# Patient Record
Sex: Female | Born: 1935 | Race: Black or African American | Hispanic: No | Marital: Married | Smoking: Never smoker
Health system: Southern US, Community
[De-identification: ages and names within clinical notes are randomized; demographics above are authoritative.]

## PROBLEM LIST (undated history)

## (undated) DIAGNOSIS — I729 Aneurysm of unspecified site: Secondary | ICD-10-CM

## (undated) DIAGNOSIS — I4891 Unspecified atrial fibrillation: Secondary | ICD-10-CM

## (undated) DIAGNOSIS — I509 Heart failure, unspecified: Secondary | ICD-10-CM

## (undated) DIAGNOSIS — E785 Hyperlipidemia, unspecified: Secondary | ICD-10-CM

## (undated) DIAGNOSIS — I10 Essential (primary) hypertension: Secondary | ICD-10-CM

## (undated) DIAGNOSIS — J189 Pneumonia, unspecified organism: Secondary | ICD-10-CM

## (undated) DIAGNOSIS — I2510 Atherosclerotic heart disease of native coronary artery without angina pectoris: Secondary | ICD-10-CM

## (undated) DIAGNOSIS — I219 Acute myocardial infarction, unspecified: Secondary | ICD-10-CM

## (undated) DIAGNOSIS — N186 End stage renal disease: Secondary | ICD-10-CM

## (undated) DIAGNOSIS — D649 Anemia, unspecified: Principal | ICD-10-CM

## (undated) DIAGNOSIS — Z992 Dependence on renal dialysis: Secondary | ICD-10-CM

## (undated) DIAGNOSIS — T81718A Complication of other artery following a procedure, not elsewhere classified, initial encounter: Secondary | ICD-10-CM

## (undated) DIAGNOSIS — F419 Anxiety disorder, unspecified: Secondary | ICD-10-CM

## (undated) DIAGNOSIS — R011 Cardiac murmur, unspecified: Secondary | ICD-10-CM

## (undated) DIAGNOSIS — I350 Nonrheumatic aortic (valve) stenosis: Secondary | ICD-10-CM

## (undated) HISTORY — PX: CARDIAC CATHETERIZATION: SHX172

## (undated) HISTORY — PX: FOREARM FRACTURE SURGERY: SHX649

## (undated) HISTORY — PX: FRACTURE SURGERY: SHX138

## (undated) HISTORY — PX: CORONARY ANGIOPLASTY WITH STENT PLACEMENT: SHX49

## (undated) HISTORY — PX: AV FISTULA PLACEMENT: SHX1204

## (undated) HISTORY — DX: Hyperlipidemia, unspecified: E78.5

## (undated) HISTORY — DX: Anemia, unspecified: D64.9

---

## 2002-03-08 ENCOUNTER — Encounter: Payer: Self-pay | Admitting: Family Medicine

## 2002-03-08 ENCOUNTER — Ambulatory Visit (HOSPITAL_COMMUNITY): Admission: RE | Admit: 2002-03-08 | Discharge: 2002-03-08 | Payer: Self-pay | Admitting: Family Medicine

## 2002-11-30 ENCOUNTER — Ambulatory Visit (HOSPITAL_COMMUNITY): Admission: RE | Admit: 2002-11-30 | Discharge: 2002-11-30 | Payer: Self-pay | Admitting: Family Medicine

## 2002-11-30 ENCOUNTER — Encounter: Payer: Self-pay | Admitting: Family Medicine

## 2004-12-13 ENCOUNTER — Ambulatory Visit (HOSPITAL_COMMUNITY): Admission: RE | Admit: 2004-12-13 | Discharge: 2004-12-13 | Payer: Self-pay | Admitting: Family Medicine

## 2006-11-27 ENCOUNTER — Ambulatory Visit (HOSPITAL_COMMUNITY): Admission: RE | Admit: 2006-11-27 | Discharge: 2006-11-27 | Payer: Self-pay | Admitting: Family Medicine

## 2006-12-09 ENCOUNTER — Ambulatory Visit (HOSPITAL_COMMUNITY): Admission: RE | Admit: 2006-12-09 | Discharge: 2006-12-09 | Payer: Self-pay | Admitting: *Deleted

## 2009-06-07 ENCOUNTER — Encounter: Payer: Self-pay | Admitting: Gastroenterology

## 2009-06-19 ENCOUNTER — Ambulatory Visit (HOSPITAL_COMMUNITY): Admission: RE | Admit: 2009-06-19 | Discharge: 2009-06-19 | Payer: Self-pay | Admitting: Gastroenterology

## 2009-06-19 ENCOUNTER — Ambulatory Visit: Payer: Self-pay | Admitting: Gastroenterology

## 2009-06-26 ENCOUNTER — Encounter: Payer: Self-pay | Admitting: Gastroenterology

## 2009-06-26 ENCOUNTER — Encounter (INDEPENDENT_AMBULATORY_CARE_PROVIDER_SITE_OTHER): Payer: Self-pay

## 2010-07-17 ENCOUNTER — Ambulatory Visit
Admission: RE | Admit: 2010-07-17 | Discharge: 2010-07-17 | Payer: Self-pay | Source: Home / Self Care | Attending: Vascular Surgery | Admitting: Vascular Surgery

## 2010-07-17 ENCOUNTER — Ambulatory Visit: Admit: 2010-07-17 | Payer: Self-pay | Admitting: Vascular Surgery

## 2010-07-30 ENCOUNTER — Ambulatory Visit (HOSPITAL_COMMUNITY)
Admission: RE | Admit: 2010-07-30 | Discharge: 2010-07-30 | Payer: Self-pay | Source: Home / Self Care | Attending: Vascular Surgery | Admitting: Vascular Surgery

## 2010-07-30 LAB — POCT I-STAT 4, (NA,K, GLUC, HGB,HCT)
Hemoglobin: 11.6 g/dL — ABNORMAL LOW (ref 12.0–15.0)
Potassium: 5.2 mEq/L — ABNORMAL HIGH (ref 3.5–5.1)
Sodium: 140 mEq/L (ref 135–145)

## 2010-07-30 LAB — SURGICAL PCR SCREEN
MRSA, PCR: NEGATIVE
Staphylococcus aureus: NEGATIVE

## 2010-07-31 LAB — POCT I-STAT 4, (NA,K, GLUC, HGB,HCT)
Glucose, Bld: 98 mg/dL (ref 70–99)
HCT: 32 % — ABNORMAL LOW (ref 36.0–46.0)
Hemoglobin: 10.9 g/dL — ABNORMAL LOW (ref 12.0–15.0)

## 2010-08-03 NOTE — Op Note (Signed)
  NAME:  [name], [name] NO.:  [id number]  MEDICAL RECORD NO.:  [id number]          PATIENT TYPE:  AMB  LOCATION:  SDS                          FACILITY:  [facility]  PHYSICIAN:  Larina Earthly, [name]    DATE OF BIRTH:  1927/12/22  DATE OF PROCEDURE:  07/30/2010 DATE OF DISCHARGE:  07/30/2010                              OPERATIVE REPORT   PREOPERATIVE DIAGNOSIS:  Chronic renal insufficiency.  POSTOPERATIVE DIAGNOSIS:  Chronic renal insufficiency.  PROCEDURE:  Left upper arm AV fistula creation.  SURGEON:  Larina Earthly, MD.  ASSISTANT:  Della Goo, PA-C  ANESTHESIA:  MAC.  COMPLICATIONS:  None.  DISPOSITION:  Recovery room, stable.  PROCEDURE IN DETAIL:  The patient was taken to the operating room and placed in supine position where the area of the right arm was prepped and draped in the usual sterile fashion.  SonoSite ultrasound was used to confirm good caliber cephalic vein at the antecubital space extending onto the upper arm.  Using local anesthesia, the incision was made at the antecubital space, carried down to isolate the cephalic vein and the brachial artery.  The artery was of good caliber with minimal atherosclerotic change.  The cephalic vein was mobilized proximally and distally, and tributary branches were ligated with 3-0 and 4-0 silk ties and divided.  The vein was ligated distally and was divided and mobilized to the level of brachial artery.  The brachial artery was occluded proximally and distally, and a small arteriotomy was created. The vein was sewn end-to-side to the artery with a running 6-0 Prolene suture.  Clamps removed and excellent thrill was noted through the vein. The wound was irrigated with saline.  Hemostasis with electrocautery. Wound was closed with 3-0 Vicryl in the subcutaneous and subcuticular tissues.  Benzoin and Steri-Strips were applied.     Larina Earthly, [name]     TFE/MEDQ  D:  07/30/2010  T:   07/31/2010  Job:  [id number]  Electronically Signed by TODD EARLY [name] on 08/03/2010 03:35:37 PM

## 2010-09-13 ENCOUNTER — Ambulatory Visit (HOSPITAL_COMMUNITY): Payer: PRIVATE HEALTH INSURANCE

## 2010-09-13 ENCOUNTER — Encounter (HOSPITAL_COMMUNITY): Payer: PRIVATE HEALTH INSURANCE | Attending: Family Medicine

## 2010-09-13 ENCOUNTER — Other Ambulatory Visit (HOSPITAL_COMMUNITY): Payer: PRIVATE HEALTH INSURANCE

## 2010-09-13 DIAGNOSIS — Z992 Dependence on renal dialysis: Secondary | ICD-10-CM | POA: Insufficient documentation

## 2010-09-13 DIAGNOSIS — D631 Anemia in chronic kidney disease: Secondary | ICD-10-CM | POA: Insufficient documentation

## 2010-09-13 DIAGNOSIS — N186 End stage renal disease: Secondary | ICD-10-CM | POA: Insufficient documentation

## 2010-09-13 DIAGNOSIS — I120 Hypertensive chronic kidney disease with stage 5 chronic kidney disease or end stage renal disease: Secondary | ICD-10-CM | POA: Insufficient documentation

## 2010-09-14 ENCOUNTER — Ambulatory Visit: Payer: Self-pay

## 2010-09-20 ENCOUNTER — Ambulatory Visit (HOSPITAL_COMMUNITY): Payer: PRIVATE HEALTH INSURANCE

## 2010-09-25 ENCOUNTER — Ambulatory Visit: Payer: Self-pay | Admitting: Vascular Surgery

## 2010-09-25 ENCOUNTER — Ambulatory Visit (INDEPENDENT_AMBULATORY_CARE_PROVIDER_SITE_OTHER): Payer: Medicare Other | Admitting: Vascular Surgery

## 2010-09-25 DIAGNOSIS — N186 End stage renal disease: Secondary | ICD-10-CM

## 2010-09-26 NOTE — Assessment & Plan Note (Signed)
OFFICE VISIT  [name], [name] DOB:  1928/05/11                                       09/25/2010 HYQMV#:[id number]  The patient presents today for followup of her left upper arm AV fistula creation by myself on July 30, 2010.  She reports that she is still not on kidney dialysis but is having progressive renal insufficiency. She has had no difficulty related to her fistula creation.  On physical exam, she has a well-healed antecubital incision.  She has excellent Rahsaan Weakland maturation of her fistula.  This is quite large with an excellent thrill.  I am quite encouraged by this and feel that she will have a very high chance of using her fistula if she comes to hemodialysis.  We would prefer to wait a total of 3 months from the creation of this on July 30, 2010.  But, if she had would need dialysis within the next several weeks, I feel that it would be acceptable to begin using her left upper arm AV fistula.  She will see [place] again on an as-needed basis.    Larina Earthly, [name] Electronically Signed  TFE/MEDQ  D:  09/25/2010  T:  09/26/2010  Job:  [id number]  cc:   Jorja Loa, [name]

## 2010-09-27 ENCOUNTER — Ambulatory Visit (HOSPITAL_COMMUNITY): Payer: Self-pay

## 2010-09-27 ENCOUNTER — Encounter (HOSPITAL_COMMUNITY): Payer: PRIVATE HEALTH INSURANCE

## 2010-09-27 ENCOUNTER — Ambulatory Visit: Payer: Self-pay

## 2010-09-27 ENCOUNTER — Ambulatory Visit (HOSPITAL_COMMUNITY): Payer: Medicare Other

## 2010-10-04 ENCOUNTER — Ambulatory Visit (HOSPITAL_COMMUNITY): Payer: Self-pay

## 2010-10-04 ENCOUNTER — Ambulatory Visit (HOSPITAL_COMMUNITY): Payer: Medicare Other

## 2010-10-04 ENCOUNTER — Other Ambulatory Visit (HOSPITAL_COMMUNITY): Payer: Medicare Other

## 2010-10-04 ENCOUNTER — Encounter (HOSPITAL_COMMUNITY): Payer: PRIVATE HEALTH INSURANCE

## 2010-10-11 ENCOUNTER — Encounter (HOSPITAL_COMMUNITY): Payer: Medicare Other | Attending: Family Medicine

## 2010-10-11 ENCOUNTER — Other Ambulatory Visit (HOSPITAL_COMMUNITY): Payer: Self-pay

## 2010-10-11 DIAGNOSIS — N039 Chronic nephritic syndrome with unspecified morphologic changes: Secondary | ICD-10-CM | POA: Insufficient documentation

## 2010-10-11 DIAGNOSIS — D631 Anemia in chronic kidney disease: Secondary | ICD-10-CM | POA: Insufficient documentation

## 2010-10-11 DIAGNOSIS — Z992 Dependence on renal dialysis: Secondary | ICD-10-CM | POA: Insufficient documentation

## 2010-10-11 DIAGNOSIS — N186 End stage renal disease: Secondary | ICD-10-CM | POA: Insufficient documentation

## 2010-10-11 DIAGNOSIS — I120 Hypertensive chronic kidney disease with stage 5 chronic kidney disease or end stage renal disease: Secondary | ICD-10-CM | POA: Insufficient documentation

## 2010-10-18 ENCOUNTER — Encounter (HOSPITAL_COMMUNITY): Payer: Medicare Other

## 2010-10-25 ENCOUNTER — Encounter (HOSPITAL_COMMUNITY): Payer: Medicare Other

## 2010-11-01 ENCOUNTER — Encounter (HOSPITAL_COMMUNITY): Payer: Medicare Other

## 2010-11-08 ENCOUNTER — Other Ambulatory Visit: Payer: Self-pay | Admitting: Family Medicine

## 2010-11-08 ENCOUNTER — Encounter (HOSPITAL_COMMUNITY): Payer: PRIVATE HEALTH INSURANCE | Attending: Family Medicine

## 2010-11-08 DIAGNOSIS — N186 End stage renal disease: Secondary | ICD-10-CM | POA: Insufficient documentation

## 2010-11-08 DIAGNOSIS — I120 Hypertensive chronic kidney disease with stage 5 chronic kidney disease or end stage renal disease: Secondary | ICD-10-CM | POA: Insufficient documentation

## 2010-11-08 DIAGNOSIS — Z992 Dependence on renal dialysis: Secondary | ICD-10-CM | POA: Insufficient documentation

## 2010-11-08 DIAGNOSIS — D631 Anemia in chronic kidney disease: Secondary | ICD-10-CM | POA: Insufficient documentation

## 2010-11-08 LAB — RENAL FUNCTION PANEL
Albumin: 3.7 g/dL (ref 3.5–5.2)
BUN: 47 mg/dL — ABNORMAL HIGH (ref 6–23)
CO2: 16 mEq/L — ABNORMAL LOW (ref 19–32)
Calcium: 8.2 mg/dL — ABNORMAL LOW (ref 8.4–10.5)
Chloride: 108 mEq/L (ref 96–112)
Creatinine, Ser: 4.78 mg/dL — ABNORMAL HIGH (ref 0.4–1.2)
GFR calc Af Amer: 11 mL/min — ABNORMAL LOW (ref >60)

## 2010-11-08 LAB — HEMOGLOBIN AND HEMATOCRIT, BLOOD: HCT: 37.8 % (ref 36.0–46.0)

## 2010-11-12 ENCOUNTER — Encounter (HOSPITAL_COMMUNITY): Payer: PRIVATE HEALTH INSURANCE

## 2010-11-20 ENCOUNTER — Encounter (HOSPITAL_COMMUNITY): Payer: PRIVATE HEALTH INSURANCE

## 2010-11-20 NOTE — Consult Note (Signed)
NEW PATIENT CONSULTATION   [name], [name]  DOB:  01-Aug-1927                                       07/17/2010  XBMWU#:[id number]   Patient presents today for evaluation and discussion regarding AV  access.  She is a very pleasant, delightful 77 year old black female  with progressive renal insufficiency.  According to Dr. Susa Griffins note,  her most recent creatinine was in the range of 3, and we have been  consulted for discussion of access placement.  She does not have any  uremic symptoms currently.  She is quite active and cares for her  husband at home.  She does have a history of hypertension.  Denies any  cardiac disease.  Does not have any history of congestive heart failure.  Prior surgery is related to a fall down a flight of stairs, breaking  both arms and having external fixators repaired at that time.   SOCIAL HISTORY:  She is married with 13 children.  She does not smoke or  drink alcohol.   FAMILY HISTORY:  Negative premature atherosclerotic disease.   REVIEW OF SYSTEMS:  No weight loss or gain.  Her weight is reported at  145 pounds.  She is 5 feet 5 inches tall.  CARDIAC:  Positive for heart murmur.  URINARY:  Positive for renal insufficiency.  Otherwise negative except for HPI.   PHYSICAL EXAMINATION:  A well-developed and well-nourished black female  appearing younger than stated age.  Blood pressure is 155/69, pulse 82,  respirations 16.  She is in no acute stress.  HEENT is normal.  Her  radial pulses are 2+ bilaterally.  She has 2+ brachial pulses  bilaterally.  Abdomen is soft, nontender.  No masses.  Musculoskeletal  shows no major deformity or cyanosis.  Neurologic:  No focal weakness or  paresthesias.  Skin without ulcers or rashes.   She underwent vein map evaluation in our office, and this revealed  patent cephalic vein in the left upper arm, a small cephalic vein in the  left forearm.  She does have small forearm cephalic vein  on the right as  well and a smaller-caliber vein in her right upper arm.   I discussed options with patient.  I explained the indications for  hemodialysis access catheter, AV fistulas, and AV grafts.  I reimaged  her veins myself with a SonoSite, and she does have adequate antecubital  vein on the left for access attempt.  I explained that this would not  hasten her need for potential access but hopefully would have access  available should she come to hemodialysis.  I have recommended that we  proceed with a left upper arm AV fistula creation, and we will schedule  this at her convenience on 01/23 at [facility] as an  outpatient.     Larina Earthly, [name]  Electronically Signed   TFE/MEDQ  D:  07/17/2010  T:  07/17/2010  Job:  [id number]   cc:   Jorja Loa, [name]  Annia Friendly. Loleta Chance, MD

## 2010-11-20 NOTE — Procedures (Signed)
CEPHALIC VEIN MAPPING   INDICATION:  Preoperative AVF placement.   HISTORY:  Chronic kidney disease.   EXAM:  The right cephalic vein is compressible with diameter measurements  ranging from 0.19 to 0.37 cm.   The right basilic vein is compressible with diameter measurements  ranging from 0.27 to 0.51 cm.   The left cephalic vein is compressible with diameter measurements  ranging from 0.20 to 0.50 cm.   The left basilic vein is compressible with diameter measurements ranging  from 0.37 to 0.54 cm.   See attached worksheet for all measurements.   IMPRESSION:  Patent right and left cephalic veins and basilic veins with  diameter measurements as described above.   ___________________________________________  Larina Earthly, [name]   EM/MEDQ  D:  07/17/2010  T:  07/17/2010  Job:  [id number]

## 2010-11-27 ENCOUNTER — Encounter (HOSPITAL_COMMUNITY): Payer: PRIVATE HEALTH INSURANCE

## 2010-12-04 ENCOUNTER — Other Ambulatory Visit (HOSPITAL_COMMUNITY): Payer: Self-pay | Admitting: Oncology

## 2010-12-04 ENCOUNTER — Encounter (HOSPITAL_COMMUNITY): Payer: PRIVATE HEALTH INSURANCE

## 2010-12-04 LAB — HEMOGLOBIN AND HEMATOCRIT, BLOOD
HCT: 35.3 % — ABNORMAL LOW (ref 36.0–46.0)
Hemoglobin: 11 g/dL — ABNORMAL LOW (ref 12.0–15.0)

## 2010-12-04 LAB — RENAL FUNCTION PANEL
Albumin: 3.6 g/dL (ref 3.5–5.2)
Chloride: 107 mEq/L (ref 96–112)
Phosphorus: 6 mg/dL — ABNORMAL HIGH (ref 2.3–4.6)
Potassium: 4.7 mEq/L (ref 3.5–5.1)
Sodium: 137 mEq/L (ref 135–145)

## 2010-12-11 ENCOUNTER — Encounter (HOSPITAL_COMMUNITY): Payer: Medicare Other | Attending: Nephrology

## 2010-12-11 DIAGNOSIS — Z992 Dependence on renal dialysis: Secondary | ICD-10-CM | POA: Insufficient documentation

## 2010-12-11 DIAGNOSIS — I120 Hypertensive chronic kidney disease with stage 5 chronic kidney disease or end stage renal disease: Secondary | ICD-10-CM | POA: Insufficient documentation

## 2010-12-11 DIAGNOSIS — D631 Anemia in chronic kidney disease: Secondary | ICD-10-CM | POA: Insufficient documentation

## 2010-12-11 DIAGNOSIS — N186 End stage renal disease: Secondary | ICD-10-CM | POA: Insufficient documentation

## 2010-12-11 DIAGNOSIS — N039 Chronic nephritic syndrome with unspecified morphologic changes: Secondary | ICD-10-CM | POA: Insufficient documentation

## 2010-12-18 ENCOUNTER — Encounter (HOSPITAL_COMMUNITY): Payer: Medicare Other

## 2010-12-19 ENCOUNTER — Ambulatory Visit (HOSPITAL_COMMUNITY): Payer: Medicare Other

## 2010-12-25 ENCOUNTER — Encounter (HOSPITAL_COMMUNITY): Payer: Medicare Other

## 2011-01-01 ENCOUNTER — Ambulatory Visit (HOSPITAL_COMMUNITY): Payer: Medicare Other

## 2011-01-08 ENCOUNTER — Encounter (HOSPITAL_COMMUNITY): Payer: PRIVATE HEALTH INSURANCE | Attending: Family Medicine

## 2011-01-08 ENCOUNTER — Other Ambulatory Visit: Payer: Self-pay | Admitting: Nephrology

## 2011-01-08 DIAGNOSIS — N186 End stage renal disease: Secondary | ICD-10-CM | POA: Insufficient documentation

## 2011-01-08 DIAGNOSIS — I120 Hypertensive chronic kidney disease with stage 5 chronic kidney disease or end stage renal disease: Secondary | ICD-10-CM | POA: Insufficient documentation

## 2011-01-08 DIAGNOSIS — D631 Anemia in chronic kidney disease: Secondary | ICD-10-CM | POA: Insufficient documentation

## 2011-01-08 DIAGNOSIS — N039 Chronic nephritic syndrome with unspecified morphologic changes: Secondary | ICD-10-CM | POA: Insufficient documentation

## 2011-01-08 DIAGNOSIS — Z992 Dependence on renal dialysis: Secondary | ICD-10-CM | POA: Insufficient documentation

## 2011-01-08 LAB — RENAL FUNCTION PANEL
Albumin: 3.5 g/dL (ref 3.5–5.2)
Calcium: 8.3 mg/dL — ABNORMAL LOW (ref 8.4–10.5)
Creatinine, Ser: 5.58 mg/dL — ABNORMAL HIGH (ref 0.50–1.10)
GFR calc Af Amer: 9 mL/min — ABNORMAL LOW (ref >60)
GFR calc non Af Amer: 7 mL/min — ABNORMAL LOW (ref >60)
Phosphorus: 5.2 mg/dL — ABNORMAL HIGH (ref 2.3–4.6)
Sodium: 138 mEq/L (ref 135–145)

## 2011-01-08 LAB — HEMOGLOBIN AND HEMATOCRIT, BLOOD
HCT: 37 % (ref 36.0–46.0)
Hemoglobin: 11.4 g/dL — ABNORMAL LOW (ref 12.0–15.0)

## 2011-01-15 ENCOUNTER — Encounter (HOSPITAL_BASED_OUTPATIENT_CLINIC_OR_DEPARTMENT_OTHER): Payer: PRIVATE HEALTH INSURANCE

## 2011-01-15 VITALS — BP 164/74 | HR 71

## 2011-01-15 DIAGNOSIS — N19 Unspecified kidney failure: Secondary | ICD-10-CM

## 2011-01-15 DIAGNOSIS — N189 Chronic kidney disease, unspecified: Secondary | ICD-10-CM

## 2011-01-15 DIAGNOSIS — D631 Anemia in chronic kidney disease: Secondary | ICD-10-CM

## 2011-01-15 MED ORDER — EPOETIN ALFA 10000 UNIT/ML IJ SOLN
INTRAMUSCULAR | Status: AC
  Filled 2011-01-15: qty 1

## 2011-01-15 NOTE — Progress Notes (Signed)
[name] presents today for injection per MD orders. Procrit 8,000mg  administered SQ in right Abdomen. Administration without incident. Patient tolerated well.

## 2011-01-21 ENCOUNTER — Other Ambulatory Visit (HOSPITAL_COMMUNITY): Payer: Self-pay | Admitting: Family Medicine

## 2011-01-21 DIAGNOSIS — Z139 Encounter for screening, unspecified: Secondary | ICD-10-CM

## 2011-01-22 ENCOUNTER — Encounter (HOSPITAL_BASED_OUTPATIENT_CLINIC_OR_DEPARTMENT_OTHER): Payer: PRIVATE HEALTH INSURANCE

## 2011-01-22 VITALS — BP 146/64 | HR 66

## 2011-01-22 DIAGNOSIS — D649 Anemia, unspecified: Secondary | ICD-10-CM

## 2011-01-22 MED ORDER — EPOETIN ALFA 10000 UNIT/ML IJ SOLN
INTRAMUSCULAR | Status: AC
  Administered 2011-01-22: 8000 [IU] via SUBCUTANEOUS
  Filled 2011-01-22: qty 1

## 2011-01-22 MED ORDER — EPOETIN ALFA 10000 UNIT/ML IJ SOLN: 8000.0000 [IU] | Freq: Once | INTRAMUSCULAR | Status: DC

## 2011-01-24 ENCOUNTER — Ambulatory Visit (HOSPITAL_COMMUNITY)
Admission: RE | Admit: 2011-01-24 | Discharge: 2011-01-24 | Disposition: A | Discharge status: Home / Self Care | Payer: PRIVATE HEALTH INSURANCE | Source: Ambulatory Visit | Attending: Family Medicine | Admitting: Family Medicine

## 2011-01-24 DIAGNOSIS — Z1231 Encounter for screening mammogram for malignant neoplasm of breast: Secondary | ICD-10-CM | POA: Insufficient documentation

## 2011-01-24 DIAGNOSIS — Z139 Encounter for screening, unspecified: Secondary | ICD-10-CM

## 2011-01-28 ENCOUNTER — Other Ambulatory Visit: Payer: Self-pay | Admitting: Family Medicine

## 2011-01-28 DIAGNOSIS — R928 Other abnormal and inconclusive findings on diagnostic imaging of breast: Secondary | ICD-10-CM

## 2011-01-29 ENCOUNTER — Encounter (HOSPITAL_BASED_OUTPATIENT_CLINIC_OR_DEPARTMENT_OTHER): Payer: PRIVATE HEALTH INSURANCE

## 2011-01-29 DIAGNOSIS — D631 Anemia in chronic kidney disease: Secondary | ICD-10-CM

## 2011-01-29 DIAGNOSIS — N289 Disorder of kidney and ureter, unspecified: Secondary | ICD-10-CM

## 2011-01-29 MED ORDER — EPOETIN ALFA 10000 UNIT/ML IJ SOLN
8000.0000 [IU] | Freq: Once | INTRAMUSCULAR | Status: AC
  Administered 2011-01-29: 8000 [IU] via SUBCUTANEOUS

## 2011-01-29 MED ORDER — EPOETIN ALFA 10000 UNIT/ML IJ SOLN
INTRAMUSCULAR | Status: AC
  Administered 2011-01-29: 8000 [IU] via SUBCUTANEOUS
  Filled 2011-01-29: qty 1

## 2011-01-29 NOTE — Progress Notes (Signed)
[name] presents today for injection per MD orders. Procrit 8000 units administered SQ in left Abdomen. Administration without incident. Patient tolerated well.

## 2011-02-05 ENCOUNTER — Encounter (HOSPITAL_COMMUNITY): Payer: PRIVATE HEALTH INSURANCE

## 2011-02-05 DIAGNOSIS — D649 Anemia, unspecified: Secondary | ICD-10-CM

## 2011-02-05 DIAGNOSIS — N289 Disorder of kidney and ureter, unspecified: Secondary | ICD-10-CM

## 2011-02-05 DIAGNOSIS — D631 Anemia in chronic kidney disease: Secondary | ICD-10-CM

## 2011-02-05 LAB — RENAL FUNCTION PANEL
BUN: 48 mg/dL — ABNORMAL HIGH (ref 6–23)
CO2: 13 mEq/L — ABNORMAL LOW (ref 19–32)
Calcium: 8.2 mg/dL — ABNORMAL LOW (ref 8.4–10.5)
Creatinine, Ser: 5.32 mg/dL — ABNORMAL HIGH (ref 0.50–1.10)
GFR calc Af Amer: 9 mL/min — ABNORMAL LOW (ref >60)
Glucose, Bld: 82 mg/dL (ref 70–99)
Sodium: 137 mEq/L (ref 135–145)

## 2011-02-05 MED ORDER — EPOETIN ALFA 10000 UNIT/ML IJ SOLN
8000.0000 [IU] | Freq: Once | INTRAMUSCULAR | Status: AC
  Administered 2011-02-05: 8000 [IU] via SUBCUTANEOUS

## 2011-02-06 ENCOUNTER — Ambulatory Visit (HOSPITAL_COMMUNITY)
Admission: RE | Admit: 2011-02-06 | Discharge: 2011-02-06 | Disposition: A | Discharge status: Home / Self Care | Payer: PRIVATE HEALTH INSURANCE | Source: Ambulatory Visit | Attending: Family Medicine | Admitting: Family Medicine

## 2011-02-06 DIAGNOSIS — R928 Other abnormal and inconclusive findings on diagnostic imaging of breast: Secondary | ICD-10-CM | POA: Insufficient documentation

## 2011-02-12 ENCOUNTER — Encounter (HOSPITAL_COMMUNITY): Payer: PRIVATE HEALTH INSURANCE | Attending: Nephrology

## 2011-02-12 VITALS — BP 171/74 | HR 77

## 2011-02-12 DIAGNOSIS — N289 Disorder of kidney and ureter, unspecified: Secondary | ICD-10-CM | POA: Insufficient documentation

## 2011-02-12 DIAGNOSIS — N19 Unspecified kidney failure: Secondary | ICD-10-CM | POA: Insufficient documentation

## 2011-02-12 DIAGNOSIS — D649 Anemia, unspecified: Secondary | ICD-10-CM | POA: Insufficient documentation

## 2011-02-12 MED ORDER — EPOETIN ALFA 10000 UNIT/ML IJ SOLN
INTRAMUSCULAR | Status: AC
  Administered 2011-02-12: 8000 [IU] via SUBCUTANEOUS
  Filled 2011-02-12: qty 1

## 2011-02-12 MED ORDER — EPOETIN ALFA 10000 UNIT/ML IJ SOLN
8000.0000 [IU] | Freq: Once | INTRAMUSCULAR | Status: AC
  Administered 2011-02-12: 8000 [IU] via SUBCUTANEOUS

## 2011-02-19 ENCOUNTER — Encounter (HOSPITAL_BASED_OUTPATIENT_CLINIC_OR_DEPARTMENT_OTHER): Payer: PRIVATE HEALTH INSURANCE

## 2011-02-19 DIAGNOSIS — D649 Anemia, unspecified: Secondary | ICD-10-CM

## 2011-02-19 MED ORDER — EPOETIN ALFA 10000 UNIT/ML IJ SOLN
8000.0000 [IU] | Freq: Once | INTRAMUSCULAR | Status: AC
  Administered 2011-02-19: 8000 [IU] via INTRAVENOUS

## 2011-02-19 MED ORDER — EPOETIN ALFA 10000 UNIT/ML IJ SOLN
INTRAMUSCULAR | Status: AC
  Filled 2011-02-19: qty 1

## 2011-02-26 ENCOUNTER — Other Ambulatory Visit: Payer: Self-pay | Admitting: Family Medicine

## 2011-02-26 ENCOUNTER — Encounter (HOSPITAL_COMMUNITY): Payer: PRIVATE HEALTH INSURANCE

## 2011-02-26 DIAGNOSIS — N289 Disorder of kidney and ureter, unspecified: Secondary | ICD-10-CM

## 2011-02-26 DIAGNOSIS — D649 Anemia, unspecified: Secondary | ICD-10-CM

## 2011-02-26 LAB — RENAL FUNCTION PANEL
Albumin: 3.4 g/dL — ABNORMAL LOW (ref 3.5–5.2)
Chloride: 108 mEq/L (ref 96–112)
Creatinine, Ser: 5.19 mg/dL — ABNORMAL HIGH (ref 0.50–1.10)
GFR calc Af Amer: 10 mL/min — ABNORMAL LOW (ref >60)
GFR calc non Af Amer: 8 mL/min — ABNORMAL LOW (ref >60)
Sodium: 139 mEq/L (ref 135–145)

## 2011-02-26 LAB — PROTEIN / CREATININE RATIO, URINE: Creatinine, Urine: 98.3 mg/dL

## 2011-02-26 MED ORDER — EPOETIN ALFA 10000 UNIT/ML IJ SOLN
INTRAMUSCULAR | Status: AC
  Administered 2011-02-26: 8000 [IU] via SUBCUTANEOUS
  Filled 2011-02-26: qty 1

## 2011-02-26 MED ORDER — EPOETIN ALFA 10000 UNIT/ML IJ SOLN
8000.0000 [IU] | Freq: Once | INTRAMUSCULAR | Status: AC
  Administered 2011-02-26: 8000 [IU] via SUBCUTANEOUS

## 2011-02-26 NOTE — Progress Notes (Signed)
Michaelle V Omeara presents today for injection per MD orders. Procrit 8000 units administered SQ in left Abdomen. Administration without incident. Patient tolerated well.  

## 2011-02-27 LAB — PTH, INTACT AND CALCIUM
Calcium, Total (PTH): 8.3 mg/dL — ABNORMAL LOW (ref 8.4–10.5)
PTH: 399 pg/mL — ABNORMAL HIGH (ref 14.0–72.0)

## 2011-03-06 ENCOUNTER — Encounter (HOSPITAL_BASED_OUTPATIENT_CLINIC_OR_DEPARTMENT_OTHER): Payer: PRIVATE HEALTH INSURANCE

## 2011-03-06 ENCOUNTER — Encounter (HOSPITAL_COMMUNITY): Payer: Self-pay

## 2011-03-06 VITALS — BP 164/71 | HR 75

## 2011-03-06 DIAGNOSIS — D649 Anemia, unspecified: Secondary | ICD-10-CM

## 2011-03-06 HISTORY — DX: Anemia, unspecified: D64.9

## 2011-03-06 MED ORDER — EPOETIN ALFA 10000 UNIT/ML IJ SOLN
10000.0000 [IU] | INTRAMUSCULAR | Status: DC
  Administered 2011-03-06: 10000 [IU] via INTRAVENOUS

## 2011-03-06 MED ORDER — EPOETIN ALFA 10000 UNIT/ML IJ SOLN
INTRAMUSCULAR | Status: AC
  Administered 2011-03-06: 10000 [IU] via INTRAVENOUS
  Filled 2011-03-06: qty 1

## 2011-03-06 NOTE — Progress Notes (Signed)
Procrit 8,000 units given subcutaneous to lower right abd. Tolerated well. VSS.

## 2011-03-12 ENCOUNTER — Ambulatory Visit (HOSPITAL_COMMUNITY): Payer: PRIVATE HEALTH INSURANCE

## 2011-03-19 ENCOUNTER — Encounter (HOSPITAL_COMMUNITY): Payer: PRIVATE HEALTH INSURANCE | Attending: Nephrology

## 2011-03-19 DIAGNOSIS — D649 Anemia, unspecified: Secondary | ICD-10-CM

## 2011-03-19 LAB — RENAL FUNCTION PANEL
Calcium: 8.1 mg/dL — ABNORMAL LOW (ref 8.4–10.5)
GFR calc Af Amer: 8 mL/min — ABNORMAL LOW (ref >60)
GFR calc non Af Amer: 7 mL/min — ABNORMAL LOW (ref >60)
Glucose, Bld: 90 mg/dL (ref 70–99)
Phosphorus: 6.6 mg/dL — ABNORMAL HIGH (ref 2.3–4.6)
Sodium: 137 mEq/L (ref 135–145)

## 2011-03-19 LAB — HEMOGLOBIN AND HEMATOCRIT, BLOOD
HCT: 37.1 % (ref 36.0–46.0)
Hemoglobin: 11.7 g/dL — ABNORMAL LOW (ref 12.0–15.0)

## 2011-03-19 NOTE — Progress Notes (Signed)
Procrit held related to lab work per MD orders.  New labs drawn per MD order.  Patient instructed to follow up with PCP.  They will contact her regarding appointment.

## 2011-03-26 ENCOUNTER — Ambulatory Visit (HOSPITAL_COMMUNITY): Payer: PRIVATE HEALTH INSURANCE

## 2011-04-02 ENCOUNTER — Ambulatory Visit (HOSPITAL_COMMUNITY): Payer: PRIVATE HEALTH INSURANCE

## 2011-06-09 ENCOUNTER — Emergency Department (HOSPITAL_COMMUNITY): Payer: PRIVATE HEALTH INSURANCE

## 2011-06-09 ENCOUNTER — Inpatient Hospital Stay (HOSPITAL_COMMUNITY): Payer: PRIVATE HEALTH INSURANCE

## 2011-06-09 ENCOUNTER — Encounter (HOSPITAL_COMMUNITY): Payer: Self-pay

## 2011-06-09 ENCOUNTER — Inpatient Hospital Stay (HOSPITAL_COMMUNITY)
Admission: EM | Admit: 2011-06-09 | Discharge: 2011-06-12 | DRG: 291 | Disposition: A | Discharge status: Home / Self Care | Payer: PRIVATE HEALTH INSURANCE | Attending: Internal Medicine | Admitting: Internal Medicine

## 2011-06-09 DIAGNOSIS — N2581 Secondary hyperparathyroidism of renal origin: Secondary | ICD-10-CM | POA: Diagnosis present

## 2011-06-09 DIAGNOSIS — N039 Chronic nephritic syndrome with unspecified morphologic changes: Secondary | ICD-10-CM | POA: Diagnosis present

## 2011-06-09 DIAGNOSIS — D649 Anemia, unspecified: Secondary | ICD-10-CM | POA: Diagnosis present

## 2011-06-09 DIAGNOSIS — I509 Heart failure, unspecified: Secondary | ICD-10-CM | POA: Diagnosis present

## 2011-06-09 DIAGNOSIS — I5030 Unspecified diastolic (congestive) heart failure: Principal | ICD-10-CM | POA: Diagnosis present

## 2011-06-09 DIAGNOSIS — Z992 Dependence on renal dialysis: Secondary | ICD-10-CM | POA: Diagnosis present

## 2011-06-09 DIAGNOSIS — J189 Pneumonia, unspecified organism: Secondary | ICD-10-CM | POA: Diagnosis present

## 2011-06-09 DIAGNOSIS — N186 End stage renal disease: Secondary | ICD-10-CM | POA: Diagnosis present

## 2011-06-09 DIAGNOSIS — I359 Nonrheumatic aortic valve disorder, unspecified: Secondary | ICD-10-CM | POA: Diagnosis present

## 2011-06-09 DIAGNOSIS — D631 Anemia in chronic kidney disease: Secondary | ICD-10-CM | POA: Diagnosis present

## 2011-06-09 DIAGNOSIS — J9600 Acute respiratory failure, unspecified whether with hypoxia or hypercapnia: Secondary | ICD-10-CM | POA: Diagnosis present

## 2011-06-09 DIAGNOSIS — I120 Hypertensive chronic kidney disease with stage 5 chronic kidney disease or end stage renal disease: Secondary | ICD-10-CM | POA: Diagnosis present

## 2011-06-09 DIAGNOSIS — E876 Hypokalemia: Secondary | ICD-10-CM | POA: Diagnosis present

## 2011-06-09 DIAGNOSIS — I10 Essential (primary) hypertension: Secondary | ICD-10-CM | POA: Diagnosis present

## 2011-06-09 HISTORY — DX: Atherosclerotic heart disease of native coronary artery without angina pectoris: I25.10

## 2011-06-09 HISTORY — DX: Essential (primary) hypertension: I10

## 2011-06-09 HISTORY — DX: Nonrheumatic aortic (valve) stenosis: I35.0

## 2011-06-09 LAB — CBC
HCT: 30.5 % — ABNORMAL LOW (ref 36.0–46.0)
Hemoglobin: 9.2 g/dL — ABNORMAL LOW (ref 12.0–15.0)
MCH: 29 pg (ref 26.0–34.0)
MCH: 29 pg (ref 26.0–34.0)
MCHC: 30.2 g/dL (ref 30.0–36.0)
MCHC: 30.3 g/dL (ref 30.0–36.0)
MCV: 96.2 fL (ref 78.0–100.0)
Platelets: 368 10*3/uL (ref 150–400)
RBC: 3.17 MIL/uL — ABNORMAL LOW (ref 3.87–5.11)

## 2011-06-09 LAB — POCT I-STAT, CHEM 8
BUN: 11 mg/dL (ref 6–23)
Calcium, Ion: 1.1 mmol/L — ABNORMAL LOW (ref 1.12–1.32)
Chloride: 100 mEq/L (ref 96–112)
Creatinine, Ser: 2.9 mg/dL — ABNORMAL HIGH (ref 0.50–1.10)
Glucose, Bld: 158 mg/dL — ABNORMAL HIGH (ref 70–99)
TCO2: 31 mmol/L (ref 0–100)

## 2011-06-09 LAB — DIFFERENTIAL
Basophils Relative: 1 % (ref 0–1)
Eosinophils Absolute: 0.3 10*3/uL (ref 0.0–0.7)
Eosinophils Relative: 2 % (ref 0–5)
Lymphs Abs: 1.7 10*3/uL (ref 0.7–4.0)
Monocytes Absolute: 0.7 10*3/uL (ref 0.1–1.0)
Monocytes Relative: 5 % (ref 3–12)
Neutrophils Relative %: 81 % — ABNORMAL HIGH (ref 43–77)

## 2011-06-09 LAB — CARDIAC PANEL(CRET KIN+CKTOT+MB+TROPI)
Relative Index: INVALID (ref 0.0–2.5)
Total CK: 75 U/L (ref 7–177)

## 2011-06-09 LAB — CREATININE, SERUM: GFR calc non Af Amer: 13 mL/min — ABNORMAL LOW (ref >90)

## 2011-06-09 MED ORDER — NIFEDIPINE ER OSMOTIC RELEASE 30 MG PO TB24
90.0000 mg | ORAL_TABLET | Freq: Every day | ORAL | Status: DC
Start: 2011-06-09 — End: 2011-06-12
  Administered 2011-06-09 – 2011-06-12 (×4): 90 mg via ORAL
  Filled 2011-06-09 (×4): qty 3

## 2011-06-09 MED ORDER — SODIUM CHLORIDE 0.9 % IV SOLN: Freq: Once | INTRAVENOUS | Status: DC

## 2011-06-09 MED ORDER — HEPARIN SODIUM (PORCINE) 1000 UNIT/ML DIALYSIS
1000.0000 [IU] | INTRAMUSCULAR | Status: DC | PRN
  Administered 2011-06-09 – 2011-06-11 (×2): 1000 [IU] via INTRAVENOUS_CENTRAL
  Filled 2011-06-09: qty 1

## 2011-06-09 MED ORDER — HEPARIN SODIUM (PORCINE) 5000 UNIT/ML IJ SOLN
5000.0000 [IU] | Freq: Three times a day (TID) | INTRAMUSCULAR | Status: DC
  Administered 2011-06-09 – 2011-06-12 (×7): 5000 [IU] via SUBCUTANEOUS
  Filled 2011-06-09 (×12): qty 1

## 2011-06-09 MED ORDER — BIOTENE DRY MOUTH MT LIQD
15.0000 mL | Freq: Four times a day (QID) | OROMUCOSAL | Status: DC
  Administered 2011-06-09 – 2011-06-12 (×10): 15 mL via OROMUCOSAL

## 2011-06-09 MED ORDER — DEXTROSE 5 % IV SOLN
1.0000 g | Freq: Once | INTRAVENOUS | Status: AC
  Administered 2011-06-09: 1 g via INTRAVENOUS
  Filled 2011-06-09: qty 1

## 2011-06-09 MED ORDER — CLONIDINE HCL 0.1 MG PO TABS
0.1000 mg | ORAL_TABLET | Freq: Once | ORAL | Status: AC
  Administered 2011-06-09: 0.1 mg via ORAL
  Filled 2011-06-09: qty 1

## 2011-06-09 MED ORDER — LABETALOL HCL 200 MG PO TABS
200.0000 mg | ORAL_TABLET | Freq: Two times a day (BID) | ORAL | Status: DC
  Administered 2011-06-09 – 2011-06-12 (×7): 200 mg via ORAL
  Filled 2011-06-09 (×10): qty 1

## 2011-06-09 MED ORDER — CALCITRIOL 0.25 MCG PO CAPS
0.5000 ug | ORAL_CAPSULE | Freq: Every day | ORAL | Status: DC
  Administered 2011-06-09 – 2011-06-12 (×4): 0.5 ug via ORAL
  Filled 2011-06-09: qty 1
  Filled 2011-06-09: qty 2
  Filled 2011-06-09: qty 1
  Filled 2011-06-09 (×4): qty 2

## 2011-06-09 MED ORDER — HEPARIN SODIUM (PORCINE) 1000 UNIT/ML DIALYSIS: 100.0000 [IU]/kg | INTRAMUSCULAR | Status: DC | PRN

## 2011-06-09 MED ORDER — ROSUVASTATIN CALCIUM 5 MG PO TABS
10.0000 mg | ORAL_TABLET | Freq: Every day | ORAL | Status: DC
  Administered 2011-06-09 – 2011-06-10 (×2): 10 mg via ORAL
  Filled 2011-06-09 (×2): qty 2

## 2011-06-09 MED ORDER — SODIUM CHLORIDE 0.9 % IV SOLN
250.0000 mL | INTRAVENOUS | Status: DC | PRN
  Administered 2011-06-09: 250 mL via INTRAVENOUS

## 2011-06-09 MED ORDER — VANCOMYCIN HCL IN DEXTROSE 1-5 GM/200ML-% IV SOLN
1000.0000 mg | Freq: Once | INTRAVENOUS | Status: AC
  Administered 2011-06-09: 1000 mg via INTRAVENOUS
  Filled 2011-06-09: qty 200

## 2011-06-09 MED ORDER — HEPARIN SODIUM (PORCINE) 1000 UNIT/ML DIALYSIS
20.0000 [IU]/kg | INTRAMUSCULAR | Status: DC | PRN
  Filled 2011-06-09: qty 2

## 2011-06-09 MED ORDER — SODIUM BICARBONATE 650 MG PO TABS
650.0000 mg | ORAL_TABLET | Freq: Three times a day (TID) | ORAL | Status: DC
  Administered 2011-06-09 – 2011-06-10 (×6): 650 mg via ORAL
  Filled 2011-06-09 (×10): qty 1

## 2011-06-09 MED ORDER — DEXTROSE 5 % IV SOLN
500.0000 mg | INTRAVENOUS | Status: DC
  Administered 2011-06-09 – 2011-06-11 (×3): 500 mg via INTRAVENOUS
  Filled 2011-06-09 (×4): qty 500

## 2011-06-09 MED ORDER — DEXTROSE 5 % IV SOLN
1.0000 g | INTRAVENOUS | Status: DC
  Administered 2011-06-09 – 2011-06-11 (×3): 1 g via INTRAVENOUS
  Filled 2011-06-09 (×5): qty 10

## 2011-06-09 MED ORDER — DEXTROSE 5 % IV SOLN
100.0000 mg | Freq: Once | INTRAVENOUS | Status: AC
  Administered 2011-06-09: 100 mg via INTRAVENOUS
  Filled 2011-06-09: qty 10

## 2011-06-09 MED ORDER — HEPARIN SODIUM (PORCINE) 1000 UNIT/ML DIALYSIS: 500.0000 [IU] | INTRAMUSCULAR | Status: DC | PRN

## 2011-06-09 NOTE — Consult Note (Signed)
Reason for Consult: End-stage renal disease Referring Physician: Dr. Omar Person is an 77 y.o. female.  HPI: She is a patient who has hypertension and also end-stage renal disease on hemodialysis recently came with complaints of her difficulty breathing and cough her since her 10 PM last night. Patient was seen in by Dr. Clista Bernhardt her primary care physician about a week ago and during that time shows 2 to her bronchitis versus pneumonia and she was put on Z-Pak. She was feeling better until last night when she started having difficulty increasing. She denies any nausea vomiting. At this moment she is feeling better. She has sputum which is white.  Past Medical History  Diagnosis Date  . Anemia 03/06/2011  . Hypertension   . Coronary artery disease   . Renal disorder     History reviewed. No pertinent past surgical history.  History reviewed. No pertinent family history.  Social History:  reports that she has never smoked. She does not have any smokeless tobacco history on file. She reports that she does not drink alcohol or use illicit drugs.  Allergies: No Known Allergies  Medications: I have reviewed the patient's current medications.  Results for orders placed during the hospital encounter of 06/09/11 (from the past 48 hour(s))  CBC     Status: Abnormal   Collection Time   06/09/11  5:01 AM      Component Value Range Comment   WBC 14.5 (*) 4.0 - 10.5 (K/uL)    RBC 3.17 (*) 3.87 - 5.11 (MIL/uL)    Hemoglobin 9.2 (*) 12.0 - 15.0 (g/dL)    HCT 16.1 (*) 09.6 - 46.0 (%)    MCV 96.2  78.0 - 100.0 (fL)    MCH 29.0  26.0 - 34.0 (pg)    MCHC 30.2  30.0 - 36.0 (g/dL)    RDW 04.5  40.9 - 81.1 (%)    Platelets 415 (*) 150 - 400 (K/uL)   DIFFERENTIAL     Status: Abnormal   Collection Time   06/09/11  5:01 AM      Component Value Range Comment   Neutrophils Relative 81 (*) 43 - 77 (%)    Neutro Abs 11.7 (*) 1.7 - 7.7 (K/uL)    Lymphocytes Relative 12  12 - 46 (%)    Lymphs Abs  1.7  0.7 - 4.0 (K/uL)    Monocytes Relative 5  3 - 12 (%)    Monocytes Absolute 0.7  0.1 - 1.0 (K/uL)    Eosinophils Relative 2  0 - 5 (%)    Eosinophils Absolute 0.3  0.0 - 0.7 (K/uL)    Basophils Relative 1  0 - 1 (%)    Basophils Absolute 0.1  0.0 - 0.1 (K/uL)   POCT I-STAT TROPONIN I     Status: Normal   Collection Time   06/09/11  5:18 AM      Component Value Range Comment   Troponin i, poc 0.03  0.00 - 0.08 (ng/mL)    Comment 3            POCT I-STAT, CHEM 8     Status: Abnormal   Collection Time   06/09/11  5:20 AM      Component Value Range Comment   Sodium 144  135 - 145 (mEq/L)    Potassium 3.1 (*) 3.5 - 5.1 (mEq/L)    Chloride 100  96 - 112 (mEq/L)    BUN 11  6 - 23 (mg/dL)  Creatinine, Ser 2.90 (*) 0.50 - 1.10 (mg/dL)    Glucose, Bld 332 (*) 70 - 99 (mg/dL)    Calcium, Ion 9.51 (*) 1.12 - 1.32 (mmol/L)    TCO2 31  0 - 100 (mmol/L)    Hemoglobin 11.2 (*) 12.0 - 15.0 (g/dL)    HCT 88.4 (*) 16.6 - 46.0 (%)     Dg Chest Port 1 View  06/09/2011  *RADIOLOGY REPORT*  Clinical Data: Shortness of breath and cough.  PORTABLE CHEST - 1 VIEW  Comparison: Chest radiograph performed 07/25/2010  Findings: The lungs are relatively well expanded.  Diffuse hazy left-sided airspace opacification is noted, and patchy right perihilar and right basilar airspace opacities are seen.  Small bilateral pleural effusions are noted.  Findings raise concern for pulmonary edema, though superimposed pneumonia cannot be excluded. No pneumothorax is identified.  The cardiomediastinal silhouette is enlarged.  No acute osseous abnormalities are seen.  IMPRESSION: Diffuse hazy left-sided airspace opacification, and patchy right perihilar and right basilar airspace opacities, with small bilateral pleural effusions and underlying cardiomegaly.  Findings concerning for pulmonary edema, though superimposed pneumonia cannot be excluded.  Original Report Authenticated By: Tonia Ghent, [name]    Review of Systems    Constitutional: Positive for chills.  HENT: Positive for congestion.   Respiratory: Positive for cough, sputum production, shortness of breath and wheezing.   Cardiovascular: Positive for PND.  Gastrointestinal: Negative for nausea, vomiting and abdominal pain.   Blood pressure 190/77, pulse 78, temperature 98.2 F (36.8 C), temperature source Axillary, resp. rate 23, height 5\' 5"  (1.651 m), weight 62 kg (136 lb 11 oz), SpO2 100.00%. Physical Exam  Eyes: No scleral icterus.  Neck: No JVD present.  Cardiovascular: Normal rate and regular rhythm.   Murmur heard. Respiratory: Effort normal. She has wheezes. She has no rales.  GI: She exhibits no distension. There is no tenderness. There is no rebound.  Musculoskeletal: She exhibits no edema.    Assessment/Plan: Problem #1 shortness of breath possibly secondary to Partially treated pneumonia however  CHF at this moment cannot ruled out. Patient was dialyzed yesterday and usually doesn't get that much amount of fluid. Presently on BiPAP patient seems to be feeling better. Problem #2 end-stage renal disease she status post hemodialysis yesterday BUN and  creatinine was in acceptable range her potassium how ever is low Problem #3 history of hypertension blood pressure seems to be somewhat high Problem #4 history of metabolic acidosis she is on sodium bicarbonate Problem #5 history of pneumonia Problem #6 history of anemia is secondary to chronic renal failure H&H seems to be stable. Problem #7 history of hypercholesterolemia. Recommendation I will make arrangements for patient to get dialysis and probably ultrafiltrate 2 to 21/2  Liters if her blood pressure tolerates. I agree with antibiotics and will continue  other medications. We'll check her CBC and basic metabolic panel in the morning.  Jetty Berland S 06/09/2011, 8:47 AM

## 2011-06-09 NOTE — ED Notes (Signed)
Dialysis t/th/sat   Sob started yesterday, denies cp.   Pt given 5 mg albuterol by ems.

## 2011-06-09 NOTE — ED Notes (Signed)
Resp. Paged to place pt back on bipap.

## 2011-06-09 NOTE — H&P (Addendum)
[name] MRN: [id number] DOB/AGE: [birth year] 75 y.o. Primary Care Physician:HILL,GERALD K, MD Admit date: 06/09/2011 Chief Complaint: Dyspnea. HPI: This very pleasant 77 year old lady was 12 hour history of sudden onset of dyspnea. She was feeling well yesterday and had dialysis. She tolerated the dialysis well and then in the evening around 10 PM she became dyspneic. She has had a cough in the last couple of days productive of white sputum. She denies any fever. When she came to the emergency room she was in respiratory failure and has required BiPAP. She remains alert. She has been on hemodialysis for the last couple of months 4 end-stage renal disease. She is hypertensive.    Past medical history: 1. End-stage renal disease likely secondary to hypertension now on hemodialysis 3 times a week. 2. Hypertension. 3. History of heart murmur.      Family history noncontributory.  Social history: She is married and lives with her husband and 50 year old son. She does not smoke and does not drink alcohol. She is retired.  Allergies: No Known Allergies  Medications Prior to Admission  Medication Dose Route Frequency Provider Last Rate Last Dose  . 0.9 %  sodium chloride infusion  250 mL Intravenous PRN Merary Garguilo C Justa Hatchell      . azithromycin (ZITHROMAX) 500 mg in dextrose 5 % 250 mL IVPB  500 mg Intravenous Q24H Zendaya Groseclose C Eva Vallee      . calcitRIOL (ROCALTROL) capsule 0.5 mcg  0.5 mcg Oral Daily Chisom Aust C Makailah Slavick      . ceFEPIme (MAXIPIME) 1 g in dextrose 5 % 50 mL IVPB  1 g Intravenous Once Harrold Donath R. Pickering, MD      . cefTRIAXone (ROCEPHIN) 1 g in dextrose 5 % 50 mL IVPB  1 g Intravenous Q24H Davyn Elsasser C Rozetta Stumpp      . cloNIDine (CATAPRES) tablet 0.1 mg  0.1 mg Oral Once [employer]. Pickering, MD   0.1 mg at 06/09/11 7829  . furosemide (LASIX) 100 mg in dextrose 5 % 50 mL IVPB  100 mg Intravenous Once Aide Wojnar C Aries Townley      . heparin injection 5,000 Units  5,000 Units Subcutaneous Q8H Jancie Kercher  C Reggie Bise      . labetalol (NORMODYNE) tablet 200 mg  200 mg Oral BID Uthman Mroczkowski C Maeven Mcdougall      . NIFEdipine (PROCARDIA XL/ADALAT-CC) 24 hr tablet 90 mg  90 mg Oral Daily Xavius Spadafore C Mikhia Dusek      . rosuvastatin (CRESTOR) tablet 20 mg  20 mg Oral Daily Evette Diclemente C Chantrell Apsey      . sodium bicarbonate tablet 650 mg  650 mg Oral TID Victory Dresden C Ben Habermann      . vancomycin (VANCOCIN) IVPB 1000 mg/200 mL premix  1,000 mg Intravenous Once [employer]. Pickering, MD   1,000 mg at 06/09/11 0659  . DISCONTD: 0.9 %  sodium chloride infusion   Intravenous Once Harrold Donath R. Rubin Payor, MD       Medications Prior to Admission  Medication Sig Dispense Refill  . epoetin alfa (PROCRIT) 56213 UNIT/ML injection Inject 0.8 mLs (8,000 Units total) into the skin once.  1 mL  0       YQM:VHQIO from the symptoms mentioned above,there are no other symptoms referable to all systems reviewed.  Physical Exam: Blood pressure 187/68, pulse 89, temperature 97.7 F (36.5 C), temperature source Oral, resp. rate 26, height 5\' 5"  (1.651 m), weight 59.875 kg (132 lb), SpO2 100.00%. She actually surprisingly looks systemically well on the BiPAP. Her  breathing is quite comfortable. She does not look toxic or septic. Heart sounds are present with a systolic murmur heard in the left sternal area which. Jugular venous pressure is not significantly elevated. Lung fields show bilateral bronchial breathing more on the right than the left. Her abdomen is soft and nontender with no evidence of hepatosplenomegaly or any masses. Neurologically, she is alert and orientated and there are no focal neurological signs. She does not have peripheral pitting edema. There are no major skin abnormalities.    Basename 06/09/11 0520 06/09/11 0501  WBC -- 14.5*  NEUTROABS -- 11.7*  HGB 11.2* 9.2*  HCT 33.0* 30.5*  MCV -- 96.2  PLT -- 415*    Basename 06/09/11 0520  NA 144  K 3.1*  CL 100  CO2 --  GLUCOSE 158*  BUN 11  CREATININE 2.90*  CALCIUM --  MG --          Dg Chest Port 1 View  06/09/2011  *RADIOLOGY REPORT*  Clinical Data: Shortness of breath and cough.  PORTABLE CHEST - 1 VIEW  Comparison: Chest radiograph performed 07/25/2010  Findings: The lungs are relatively well expanded.  Diffuse hazy left-sided airspace opacification is noted, and patchy right perihilar and right basilar airspace opacities are seen.  Small bilateral pleural effusions are noted.  Findings raise concern for pulmonary edema, though superimposed pneumonia cannot be excluded. No pneumothorax is identified.  The cardiomediastinal silhouette is enlarged.  No acute osseous abnormalities are seen.  IMPRESSION: Diffuse hazy left-sided airspace opacification, and patchy right perihilar and right basilar airspace opacities, with small bilateral pleural effusions and underlying cardiomegaly.  Findings concerning for pulmonary edema, though superimposed pneumonia cannot be excluded.  Original Report Authenticated By: Tonia Ghent, [name]   Impression: 1. Congestive heart failure. 2. Possible pneumonia, although she does not have symptoms of such. I wonder whether the bronchial breathing represents atelectasis. 3. Uncontrolled hypertension. 4. End-stage renal disease on hemodialysis. 5. Anemia of chronic kidney disease. 6. Heart murmur, possibly significant.     Plan: 1. Admit to intensive care unit. 2. Intravenous Lasix. 3. Nephrology consult. She will need hemodialysis today. 4. Pulmonary consult. 5. Intravenous antibiotics. 6. Remain on BiPAP for the time being. She may well need mechanical ventilation if she deteriorates. 7. 2D ECHO Further recommendations will depend on patient's hospital progress.      Shataya Winkles C 06/09/2011, 7:36 AM

## 2011-06-09 NOTE — Consult Note (Signed)
Consult requested by: Dr. Karilyn Cota Consult requested for respiratory failure:  HPI: This is an 77 year old who developed cough and congestion several days ago and saw her primary care physician was started on a Z-Pak. She initially improved and actually had dialysis yesterday and was seen by her nephrologist and seemed to be doing quite well. However she had much more problem starting at about 10 PM yesterday with cough congestion shortness of breath and some sputum production. She came to the emergency room was noted to be in respiratory failure requiring BiPAP. She has been admitted to the intensive care unit since. She says she's feeling better her on BiPAP.  Past Medical History  Diagnosis Date  . Anemia 03/06/2011  . Hypertension   . Coronary artery disease   . Renal disorder      History reviewed. No pertinent family history.   History   Social History  . Marital Status: Married    Spouse Name: N/A    Number of Children: N/A  . Years of Education: N/A   Social History Main Topics  . Smoking status: Never Smoker   . Smokeless tobacco: None  . Alcohol Use: No  . Drug Use: No  . Sexually Active:    Other Topics Concern  . None   Social History Narrative  . None     ROS: She denies any chest pain hemoptysis. She has some cough and congestion.    Objective: Vital signs in last 24 hours: Temp:  [97.7 F (36.5 C)-98.2 F (36.8 C)] 98.2 F (36.8 C) (12/02 0828) Pulse Rate:  [78-103] 78  (12/02 0829) Resp:  [20-33] 23  (12/02 0829) BP: (171-210)/(68-121) 190/77 mmHg (12/02 0828) SpO2:  [92 %-100 %] 100 % (12/02 0829) Weight:  [59.875 kg (132 lb)-62 kg (136 lb 11 oz)] 136 lb 11 oz (62 kg) (12/02 0828) Weight change:     Intake/Output from previous day:    PHYSICAL EXAM She is awake and alert and on BiPAP. Her pupils are reactive. Her nose and throat clear. Her neck is supple. She does not have any JVD. Her chest shows rhonchi bilaterally. Her heart is regular  without gallop. Her abdomen is soft no masses are felt. Her extremities showed no edema. Her central nervous system examination is grossly intact.  Lab Results: Basic Metabolic Panel:  Basename 06/09/11 0520  NA 144  K 3.1*  CL 100  CO2 --  GLUCOSE 158*  BUN 11  CREATININE 2.90*  CALCIUM --  MG --  PHOS --   Liver Function Tests: No results found for this basename: AST:2,ALT:2,ALKPHOS:2,BILITOT:2,PROT:2,ALBUMIN:2 in the last 72 hours No results found for this basename: LIPASE:2,AMYLASE:2 in the last 72 hours No results found for this basename: AMMONIA:2 in the last 72 hours CBC:  Basename 06/09/11 0520 06/09/11 0501  WBC -- 14.5*  NEUTROABS -- 11.7*  HGB 11.2* 9.2*  HCT 33.0* 30.5*  MCV -- 96.2  PLT -- 415*   Cardiac Enzymes: No results found for this basename: CKTOTAL:3,CKMB:3,CKMBINDEX:3,TROPONINI:3 in the last 72 hours BNP: No results found for this basename: POCBNP:3 in the last 72 hours D-Dimer: No results found for this basename: DDIMER:2 in the last 72 hours CBG: No results found for this basename: GLUCAP:6 in the last 72 hours Hemoglobin A1C: No results found for this basename: HGBA1C in the last 72 hours Fasting Lipid Panel: No results found for this basename: CHOL,HDL,LDLCALC,TRIG,CHOLHDL,LDLDIRECT in the last 72 hours Thyroid Function Tests: No results found for this basename: TSH,T4TOTAL,FREET4,T3FREE,THYROIDAB in  the last 72 hours Anemia Panel: No results found for this basename: VITAMINB12,FOLATE,FERRITIN,TIBC,IRON,RETICCTPCT in the last 72 hours Coagulation: No results found for this basename: LABPROT:2,INR:2 in the last 72 hours Urine Drug Screen: Drugs of Abuse  No results found for this basename: labopia, cocainscrnur, labbenz, amphetmu, thcu, labbarb    Alcohol Level: No results found for this basename: ETH:2 in the last 72 hours Urinalysis:  Misc. Labs:   ABGS:  Basename 06/09/11 0520  PHART --  PO2ART --  TCO2 31  HCO3 --      MICROBIOLOGY: No results found for this or any previous visit (from the past 240 hour(s)).  Studies/Results: Dg Chest Port 1 View  06/09/2011  *RADIOLOGY REPORT*  Clinical Data: Shortness of breath and cough.  PORTABLE CHEST - 1 VIEW  Comparison: Chest radiograph performed 07/25/2010  Findings: The lungs are relatively well expanded.  Diffuse hazy left-sided airspace opacification is noted, and patchy right perihilar and right basilar airspace opacities are seen.  Small bilateral pleural effusions are noted.  Findings raise concern for pulmonary edema, though superimposed pneumonia cannot be excluded. No pneumothorax is identified.  The cardiomediastinal silhouette is enlarged.  No acute osseous abnormalities are seen.  IMPRESSION: Diffuse hazy left-sided airspace opacification, and patchy right perihilar and right basilar airspace opacities, with small bilateral pleural effusions and underlying cardiomegaly.  Findings concerning for pulmonary edema, though superimposed pneumonia cannot be excluded.  Original Report Authenticated By: Tonia Ghent, [name]    Medications:  Prior to Admission:  Prescriptions prior to admission  Medication Sig Dispense Refill  . atorvastatin (LIPITOR) 40 MG tablet Take 40 mg by mouth daily.        Marland Kitchen azithromycin (ZITHROMAX) 250 MG tablet Take 250 mg by mouth daily.        . benzonatate (TESSALON) 100 MG capsule Take 100 mg by mouth every 8 (eight) hours as needed.        . calcitRIOL (ROCALTROL) 0.5 MCG capsule Take 0.5 mcg by mouth daily.        Marland Kitchen labetalol (NORMODYNE) 200 MG tablet Take 200 mg by mouth 2 (two) times daily.        Marland Kitchen NIFEdipine (PROCARDIA XL/ADALAT-CC) 90 MG 24 hr tablet Take 90 mg by mouth daily.        . sodium bicarbonate 650 MG tablet Take 650 mg by mouth 3 (three) times daily.        Marland Kitchen epoetin alfa (PROCRIT) 96045 UNIT/ML injection Inject 0.8 mLs (8,000 Units total) into the skin once.  1 mL  0   Scheduled:   . azithromycin  500 mg  Intravenous Q24H  . calcitRIOL  0.5 mcg Oral Daily  . ceFEPime (MAXIPIME) IV  1 g Intravenous Once  . cefTRIAXone (ROCEPHIN)  IV  1 g Intravenous Q24H  . cloNIDine  0.1 mg Oral Once  . furosemide  100 mg Intravenous Once  . heparin  5,000 Units Subcutaneous Q8H  . labetalol  200 mg Oral BID  . NIFEdipine  90 mg Oral Daily  . rosuvastatin  10 mg Oral Daily  . sodium bicarbonate  650 mg Oral TID  . vancomycin  1,000 mg Intravenous Once  . DISCONTD: sodium chloride   Intravenous Once   Continuous:  WUJ:WJXBJY chloride, heparin, heparin  Assesment: She has what I believe is a partially treated pneumonia. She was due to finish the Z-Pak today. She has CHF as well. She does have end-stage renal disease and is on dialysis. She has pretty  severe hypertension. Principal Problem:  *CHF (congestive heart failure) Active Problems:  Anemia  Pneumonia  HTN (hypertension)  ESRD (end stage renal disease) on dialysis    Plan: She is on vancomycin and Rocephin and Zithromax. This is good coverage and I will plan to continue.    LOS: 0 days   [name] 06/09/2011, 8:58 AM

## 2011-06-09 NOTE — Plan of Care (Signed)
Acute on Chronic HD TX performed.  Unable to request outpatient care plan on Sunday.  Patient Dialyzes at [facility] in [place] on Tues, Magdalene Molly, and  Saturday.  Patient did dialyze on Saturday.

## 2011-06-09 NOTE — Progress Notes (Signed)
CRITICAL VALUE ALERT  Critical value received:  Troponin 0.30  Date of notification:  06/09/11  Time of notification:  1700  Critical value read back:yes  Nurse who received alert:  Orlando Penner  MD notified (1st page):  Dr Karilyn Cota  Time of first page: 1700   MD notified (2nd page):n/a  Time of second page:n/a  Responding MD: Dr Karilyn Cota  Time MD responded: 1703. No new interventions ordered.

## 2011-06-09 NOTE — ED Notes (Signed)
edp notified pt starting to feel SOB again. Pt on 4l o2 via [place]. O2 sats remaining at 93% at this time.

## 2011-06-09 NOTE — ED Provider Notes (Signed)
History     CSN: [id number] Arrival date & time: 06/09/2011  4:32 AM   First MD Initiated Contact with Patient 06/09/11 0440      Chief Complaint  Patient presents with  . Shortness of Breath    (Consider location/radiation/quality/duration/timing/severity/associated sxs/prior treatment) Patient is a 77 y.o. female presenting with shortness of breath. The history is provided by the patient and a relative.  Shortness of Breath  The current episode started today. Associated symptoms include shortness of breath.   patient is a level V caveat due to the severity of disease. Patient presents with shortness of breath that began tonight. Occasional cough. Some wheezing. She has dialysis on Tuesday Thursday and Saturday and was dialyzed for a full run on Saturday/today. She has some moderate respiratory distress. Gradual onset. No fevers. Occasional cough.  Past Medical History  Diagnosis Date  . Anemia 03/06/2011  . Hypertension   . Coronary artery disease   . Renal disorder     History reviewed. No pertinent past surgical history.  History reviewed. No pertinent family history.  History  Substance Use Topics  . Smoking status: Never Smoker   . Smokeless tobacco: Not on file  . Alcohol Use: No    OB History    Grav Para Term Preterm Abortions TAB SAB Ect Mult Living                  Review of Systems  Unable to perform ROS: Other  Respiratory: Positive for shortness of breath.     Allergies  Review of patient's allergies indicates no known allergies.  Home Medications   Current Outpatient Rx  Name Route Sig Dispense Refill  . ATORVASTATIN CALCIUM 40 MG PO TABS Oral Take 40 mg by mouth daily.      . AZITHROMYCIN 250 MG PO TABS Oral Take 250 mg by mouth daily.      Marland Kitchen BENZONATATE 100 MG PO CAPS Oral Take 100 mg by mouth every 8 (eight) hours as needed.      Marland Kitchen CALCITRIOL 0.5 MCG PO CAPS Oral Take 0.5 mcg by mouth daily.      Marland Kitchen LABETALOL HCL 200 MG PO TABS Oral Take 200  mg by mouth 2 (two) times daily.      Marland Kitchen NIFEDIPINE ER OSMOTIC 90 MG PO TB24 Oral Take 90 mg by mouth daily.      . SODIUM BICARBONATE 650 MG PO TABS Oral Take 650 mg by mouth 3 (three) times daily.      . EPOETIN ALFA 40981 UNIT/ML IJ SOLN Subcutaneous Inject 0.8 mLs (8,000 Units total) into the skin once. 1 mL 0    BP 210/80  Pulse 96  Temp(Src) 97.7 F (36.5 C) (Oral)  Resp 20  Ht 5\' 5"  (1.651 m)  Wt 132 lb (59.875 kg)  BMI 21.97 kg/m2  SpO2 100%  Physical Exam  ED Course  Procedures (including critical care time)  Labs Reviewed  CBC - Abnormal; Notable for the following:    WBC 14.5 (*)    RBC 3.17 (*)    Hemoglobin 9.2 (*)    HCT 30.5 (*)    Platelets 415 (*)    All other components within normal limits  DIFFERENTIAL - Abnormal; Notable for the following:    Neutrophils Relative 81 (*)    Neutro Abs 11.7 (*)    All other components within normal limits  POCT I-STAT, CHEM 8 - Abnormal; Notable for the following:    Potassium 3.1 (*)  Creatinine, Ser 2.90 (*)    Glucose, Bld 158 (*)    Calcium, Ion 1.10 (*)    Hemoglobin 11.2 (*)    HCT 33.0 (*)    All other components within normal limits  POCT I-STAT TROPONIN I  I-STAT, CHEM 8  I-STAT TROPONIN I   Dg Chest Port 1 View  06/09/2011  *RADIOLOGY REPORT*  Clinical Data: Shortness of breath and cough.  PORTABLE CHEST - 1 VIEW  Comparison: Chest radiograph performed 07/25/2010  Findings: The lungs are relatively well expanded.  Diffuse hazy left-sided airspace opacification is noted, and patchy right perihilar and right basilar airspace opacities are seen.  Small bilateral pleural effusions are noted.  Findings raise concern for pulmonary edema, though superimposed pneumonia cannot be excluded. No pneumothorax is identified.  The cardiomediastinal silhouette is enlarged.  No acute osseous abnormalities are seen.  IMPRESSION: Diffuse hazy left-sided airspace opacification, and patchy right perihilar and right basilar  airspace opacities, with small bilateral pleural effusions and underlying cardiomegaly.  Findings concerning for pulmonary edema, though superimposed pneumonia cannot be excluded.  Original Report Authenticated By: Tonia Ghent, [name]     1. HCAP (healthcare-associated pneumonia)   2. CHF (congestive heart failure)   3. End stage renal disease on dialysis   4. Hypertension     Date: 06/09/2011  Rate: 104  Rhythm: sinus tachycardia  QRS Axis: normal  Intervals: normal  ST/T Wave abnormalities: nonspecific ST changes  Conduction Disutrbances:none  Narrative Interpretation:   Old EKG Reviewed: none available  CRITICAL CARE Performed by: Billee Cashing   Total critical care time: 30  Critical care time was exclusive of separately billable procedures and treating other patients.  Critical care was necessary to treat or prevent imminent or life-threatening deterioration.  Critical care was time spent personally by me on the following activities: development of treatment plan with patient and/or surrogate as well as nursing, discussions with consultants, evaluation of patient's response to treatment, examination of patient, obtaining history from patient or surrogate, ordering and performing treatments and interventions, ordering and review of laboratory studies, ordering and review of radiographic studies, pulse oximetry and re-evaluation of patient's condition.   MDM  Patient presented with shortness of breath that began yesterday evening. She is a dialysis patient was dialyzed yesterday. Mild cough. No vomiting diarrhea. She came in in a moderate distress. She improved on BiPAP. Her lung exam showed wheezing. Her chest x-ray showed pneumonia versus CHF or both. She was moderately hypertensive. She started on antibiotics to cover H.. She may end up needing dialysis. She is not hyperkalemic. She'll be admitted to the ICU. We did a trial without BiPAP and she became more short of  breath. He was reapplied.        Juliet Rude. Rubin Payor, MD 06/09/11 [phone]

## 2011-06-09 NOTE — ED Notes (Signed)
Pt states she has been SOB all night. Pt states she has been coughing also.

## 2011-06-10 ENCOUNTER — Inpatient Hospital Stay (HOSPITAL_COMMUNITY): Payer: PRIVATE HEALTH INSURANCE

## 2011-06-10 DIAGNOSIS — I319 Disease of pericardium, unspecified: Secondary | ICD-10-CM

## 2011-06-10 LAB — BLOOD GAS, ARTERIAL
Acid-Base Excess: 7.5 mmol/L — ABNORMAL HIGH (ref 0.0–2.0)
FIO2: 0.35 %
pCO2 arterial: 43.7 mmHg (ref 35.0–45.0)
pH, Arterial: 7.47 — ABNORMAL HIGH (ref 7.350–7.400)
pO2, Arterial: 57.7 mmHg — ABNORMAL LOW (ref 80.0–100.0)

## 2011-06-10 LAB — CBC
HCT: 27.4 % — ABNORMAL LOW (ref 36.0–46.0)
Hemoglobin: 8 g/dL — ABNORMAL LOW (ref 12.0–15.0)
MCH: 28 pg (ref 26.0–34.0)
MCHC: 29.2 g/dL — ABNORMAL LOW (ref 30.0–36.0)
MCV: 95.8 fL (ref 78.0–100.0)
RDW: 14.9 % (ref 11.5–15.5)

## 2011-06-10 LAB — PHOSPHORUS: Phosphorus: 2.8 mg/dL (ref 2.3–4.6)

## 2011-06-10 LAB — COMPREHENSIVE METABOLIC PANEL
Albumin: 2.7 g/dL — ABNORMAL LOW (ref 3.5–5.2)
BUN: 13 mg/dL (ref 6–23)
Calcium: 8.2 mg/dL — ABNORMAL LOW (ref 8.4–10.5)
Creatinine, Ser: 2.99 mg/dL — ABNORMAL HIGH (ref 0.50–1.10)
GFR calc Af Amer: 16 mL/min — ABNORMAL LOW (ref >90)
Glucose, Bld: 96 mg/dL (ref 70–99)
Potassium: 3.4 mEq/L — ABNORMAL LOW (ref 3.5–5.1)
Total Protein: 6.1 g/dL (ref 6.0–8.3)

## 2011-06-10 LAB — CARDIAC PANEL(CRET KIN+CKTOT+MB+TROPI)
CK, MB: 2.5 ng/mL (ref 0.3–4.0)
Relative Index: INVALID (ref 0.0–2.5)
Total CK: 60 U/L (ref 7–177)
Troponin I: <0.3 ng/mL (ref <0.30)

## 2011-06-10 MED ORDER — HEPARIN SODIUM (PORCINE) 1000 UNIT/ML DIALYSIS
500.0000 [IU] | INTRAMUSCULAR | Status: DC | PRN
  Administered 2011-06-11 (×2): 500 [IU] via INTRAVENOUS_CENTRAL
  Filled 2011-06-10: qty 1

## 2011-06-10 MED ORDER — EPOETIN ALFA 10000 UNIT/ML IJ SOLN
10000.0000 [IU] | INTRAMUSCULAR | Status: DC
  Filled 2011-06-10: qty 1

## 2011-06-10 MED ORDER — DEXTROSE 5 % IV SOLN
INTRAVENOUS | Status: AC
  Filled 2011-06-10: qty 10

## 2011-06-10 MED ORDER — HEPARIN SODIUM (PORCINE) 1000 UNIT/ML DIALYSIS
100.0000 [IU]/kg | Freq: Once | INTRAMUSCULAR | Status: DC
  Filled 2011-06-10: qty 7

## 2011-06-10 NOTE — Progress Notes (Signed)
Subjective: This lady appears to feel better this morning. She had dialysis yesterday and she is due to have further dialysis tomorrow. She is saturating over 90% even without oxygen while she is trying to her breakfast this morning.           Physical Exam: Blood pressure 144/56, pulse 79, temperature 98.3 F (36.8 C), temperature source Oral, resp. rate 19, height 5\' 5"  (1.651 m), weight 60.8 kg (134 lb 0.6 oz), SpO2 96.00%. There is no increased work of breathing. Lung fields show bilateral bronchial breathing on the left and the right. There are a few inspiratory crackles noted. Heart sounds are present with a harsh aortic systolic murmur radiating to both carotids. She is alert and orientated.   Investigations:  Recent Results (from the past 240 hour(s))  MRSA PCR SCREENING     Status: Normal   Collection Time   06/09/11  8:50 AM      Component Value Range Status Comment   MRSA by PCR NEGATIVE  NEGATIVE  Final   CULTURE, BLOOD (ROUTINE X 2)     Status: Normal (Preliminary result)   Collection Time   06/09/11 11:25 AM      Component Value Range Status Comment   Specimen Description BLOOD LEFT HAND   Final    Special Requests     Final    Value: BOTTLES DRAWN AEROBIC AND ANAEROBIC 6CC EACH BOTTLE   Culture PENDING   Incomplete    Report Status PENDING   Incomplete   CULTURE, BLOOD (ROUTINE X 2)     Status: Normal (Preliminary result)   Collection Time   06/09/11 11:35 AM      Component Value Range Status Comment   Specimen Description BLOOD LEFT HAND   Final    Special Requests     Final    Value: BOTTLES DRAWN AEROBIC AND ANAEROBIC 8CC EACH BOTTLE   Culture PENDING   Incomplete    Report Status PENDING   Incomplete      Basic Metabolic Panel:  Basename 06/10/11 0512 06/09/11 0934 06/09/11 0520  NA 140 -- 144  K 3.4* -- 3.1*  CL 100 -- 100  CO2 33* -- --  GLUCOSE 96 -- 158*  BUN 13 -- 11  CREATININE 2.99* 3.07* --  CALCIUM 8.2* -- --  MG -- -- --  PHOS 2.8 --  --   Liver Function Tests:  [facility] 06/10/11 0512  AST 11  ALT 6  ALKPHOS 47  BILITOT 0.3  PROT 6.1  ALBUMIN 2.7*     CBC:  Basename 06/10/11 0512 06/09/11 0903 06/09/11 0501  WBC 11.4* 12.4* --  NEUTROABS -- -- 11.7*  HGB 8.0* 8.4* --  HCT 27.4* 27.7* --  MCV 95.8 95.5 --  PLT 298 368 --    Portable Chest Xray In Am  06/10/2011  *RADIOLOGY REPORT*  Clinical Data: Follow up of CHF.  Respiratory failure.  PORTABLE CHEST - 1 VIEW  Comparison: 1 day prior  Findings: Midline trachea.  Moderate cardiomegaly.  Increased small left and persistent small right pleural effusion. No pneumothorax. No change in lower lobe predominant interstitial and airspace disease.  IMPRESSION:  1.  No change in congestive heart failure. 2.  Persistent small right and slight increase in small left pleural effusion. 3.  Lower lobe predominant airspace disease.  Favor atelectasis or alveolar edema.  Concurrent infection cannot be excluded.  Original Report Authenticated By: Consuello Bossier, [name]   Dg Chest [facility]  06/09/2011  *RADIOLOGY REPORT*  Clinical Data: Shortness of breath and cough.  PORTABLE CHEST - 1 VIEW  Comparison: Chest radiograph performed 07/25/2010  Findings: The lungs are relatively well expanded.  Diffuse hazy left-sided airspace opacification is noted, and patchy right perihilar and right basilar airspace opacities are seen.  Small bilateral pleural effusions are noted.  Findings raise concern for pulmonary edema, though superimposed pneumonia cannot be excluded. No pneumothorax is identified.  The cardiomediastinal silhouette is enlarged.  No acute osseous abnormalities are seen.  IMPRESSION: Diffuse hazy left-sided airspace opacification, and patchy right perihilar and right basilar airspace opacities, with small bilateral pleural effusions and underlying cardiomegaly.  Findings concerning for pulmonary edema, though superimposed pneumonia cannot be excluded.  Original Report Authenticated  By: Tonia Ghent, [name]      Medications: I have reviewed the patient's current medications.  Impression: 1 congestive heart failure. 2. Pneumonia. 3. End stage renal disease on hemodialysis. 4. Hypertension. 5. Anemia of chronic disease. 6. Aortic systolic heart murmur, unclear severity.     Plan: 1. Continue current treatment. 2. Await echocardiogram. I appreciate Dr Fausto Skillern and Dr. Zadie Cleverly input.     LOS: 1 day   Tyberius Ryner C 06/10/2011, 8:16 AM

## 2011-06-10 NOTE — Progress Notes (Signed)
*  PRELIMINARY RESULTS* Echocardiogram 2D Echocardiogram has been performed.  [name] 06/10/2011, 12:11 PM

## 2011-06-10 NOTE — Progress Notes (Signed)
Subjective: She looks much better this morning. She has no new complaints. Her breathing is better.  Objective: Vital signs in last 24 hours: Temp:  [97.9 F (36.6 C)-98.3 F (36.8 C)] 98.3 F (36.8 C) (12/03 0400) Pulse Rate:  [65-82] 71  (12/03 0400) Resp:  [17-26] 17  (12/03 0400) BP: (103-190)/(44-77) 138/55 mmHg (12/03 0400) SpO2:  [88 %-100 %] 94 % (12/03 0400) FiO2 (%):  [35 %-60 %] 50 % (12/02 2044) Weight:  [60.8 kg (134 lb 0.6 oz)-62 kg (136 lb 11 oz)] 134 lb 0.6 oz (60.8 kg) (12/03 0500) Weight change: 2.125 kg (4 lb 11 oz) Last BM Date: 06/08/11  Intake/Output from previous day: 12/02 0701 - 12/03 0700 In: 850 [P.O.:120; I.V.:380; IV Piggyback:350] Out: 2300   PHYSICAL EXAM General appearance: alert, cooperative and no distress Resp: rhonchi bilaterally Cardio: regular rate and rhythm, S1, S2 normal, no murmur, click, rub or gallop GI: soft, non-tender; bowel sounds normal; no masses,  no organomegaly Extremities: extremities normal, atraumatic, no cyanosis or edema  Lab Results:    Basic Metabolic Panel:  Basename 06/10/11 0512 06/09/11 0934 06/09/11 0520  NA 140 -- 144  K 3.4* -- 3.1*  CL 100 -- 100  CO2 33* -- --  GLUCOSE 96 -- 158*  BUN 13 -- 11  CREATININE 2.99* 3.07* --  CALCIUM 8.2* -- --  MG -- -- --  PHOS 2.8 -- --   Liver Function Tests:  [facility] 06/10/11 0512  AST 11  ALT 6  ALKPHOS 47  BILITOT 0.3  PROT 6.1  ALBUMIN 2.7*   No results found for this basename: LIPASE:2,AMYLASE:2 in the last 72 hours No results found for this basename: AMMONIA:2 in the last 72 hours CBC:  Basename 06/10/11 0512 06/09/11 0903 06/09/11 0501  WBC 11.4* 12.4* --  NEUTROABS -- -- 11.7*  HGB 8.0* 8.4* --  HCT 27.4* 27.7* --  MCV 95.8 95.5 --  PLT 298 368 --   Cardiac Enzymes:  Basename 06/09/11 2352 06/09/11 1548 06/09/11 0909  CKTOTAL 60 71 75  CKMB 2.5 3.3 3.4  CKMBINDEX -- -- --  TROPONINI <0.30 0.30* <0.30   BNP:  Basename 06/10/11  0512  POCBNP 27121.0*   D-Dimer: No results found for this basename: DDIMER:2 in the last 72 hours CBG: No results found for this basename: GLUCAP:6 in the last 72 hours Hemoglobin A1C: No results found for this basename: HGBA1C in the last 72 hours Fasting Lipid Panel: No results found for this basename: CHOL,HDL,LDLCALC,TRIG,CHOLHDL,LDLDIRECT in the last 72 hours Thyroid Function Tests: No results found for this basename: TSH,T4TOTAL,FREET4,T3FREE,THYROIDAB in the last 72 hours Anemia Panel: No results found for this basename: VITAMINB12,FOLATE,FERRITIN,TIBC,IRON,RETICCTPCT in the last 72 hours Coagulation: No results found for this basename: LABPROT:2,INR:2 in the last 72 hours Urine Drug Screen: Drugs of Abuse  No results found for this basename: labopia, cocainscrnur, labbenz, amphetmu, thcu, labbarb    Alcohol Level: No results found for this basename: ETH:2 in the last 72 hours Urinalysis:  Misc. Labs:  ABGS  Basename 06/10/11 0600  PHART 7.470*  PO2ART 57.7*  TCO2 29.7  HCO3 31.4*   CULTURES Recent Results (from the past 240 hour(s))  MRSA PCR SCREENING     Status: Normal   Collection Time   06/09/11  8:50 AM      Component Value Range Status Comment   MRSA by PCR NEGATIVE  NEGATIVE  Final   CULTURE, BLOOD (ROUTINE X 2)     Status: Normal (Preliminary  result)   Collection Time   06/09/11 11:25 AM      Component Value Range Status Comment   Specimen Description BLOOD LEFT HAND   Final    Special Requests     Final    Value: BOTTLES DRAWN AEROBIC AND ANAEROBIC 6CC EACH BOTTLE   Culture PENDING   Incomplete    Report Status PENDING   Incomplete   CULTURE, BLOOD (ROUTINE X 2)     Status: Normal (Preliminary result)   Collection Time   06/09/11 11:35 AM      Component Value Range Status Comment   Specimen Description BLOOD LEFT HAND   Final    Special Requests     Final    Value: BOTTLES DRAWN AEROBIC AND ANAEROBIC 8CC EACH BOTTLE   Culture PENDING    Incomplete    Report Status PENDING   Incomplete    Studies/Results: Dg Chest Port 1 View  06/09/2011  *RADIOLOGY REPORT*  Clinical Data: Shortness of breath and cough.  PORTABLE CHEST - 1 VIEW  Comparison: Chest radiograph performed 07/25/2010  Findings: The lungs are relatively well expanded.  Diffuse hazy left-sided airspace opacification is noted, and patchy right perihilar and right basilar airspace opacities are seen.  Small bilateral pleural effusions are noted.  Findings raise concern for pulmonary edema, though superimposed pneumonia cannot be excluded. No pneumothorax is identified.  The cardiomediastinal silhouette is enlarged.  No acute osseous abnormalities are seen.  IMPRESSION: Diffuse hazy left-sided airspace opacification, and patchy right perihilar and right basilar airspace opacities, with small bilateral pleural effusions and underlying cardiomegaly.  Findings concerning for pulmonary edema, though superimposed pneumonia cannot be excluded.  Original Report Authenticated By: Tonia Ghent, [name]    Medications:  Scheduled:   . antiseptic oral rinse  15 mL Mouth Rinse QID  . azithromycin  500 mg Intravenous Q24H  . calcitRIOL  0.5 mcg Oral Daily  . ceFEPime (MAXIPIME) IV  1 g Intravenous Once  . cefTRIAXone (ROCEPHIN)  IV  1 g Intravenous Q24H  . furosemide  100 mg Intravenous Once  . heparin  5,000 Units Subcutaneous Q8H  . labetalol  200 mg Oral BID  . NIFEdipine  90 mg Oral Daily  . rosuvastatin  10 mg Oral Daily  . sodium bicarbonate  650 mg Oral TID  . vancomycin  1,000 mg Intravenous Once   Continuous:  ZOX:WRUEAV chloride, heparin, heparin, DISCONTD: heparin, DISCONTD: heparin  Assesment: She has had respiratory failure which I think is multifactorial. She clearly has some component of volume overload but may have a partially treated pneumonia as well. She is being treated now for both. Otherwise she has improved as far as her respiratory situation is concerned.  She is off BiPAP and looks much more comfortable Principal Problem:  *CHF (congestive heart failure) Active Problems:  Anemia  Pneumonia  HTN (hypertension)  ESRD (end stage renal disease) on dialysis    Plan: No change in treatments continue with nasal oxygen antibiotics inhaled bronchodilators    LOS: 1 day   Rimsha Trembley L 06/10/2011, 7:36 AM

## 2011-06-10 NOTE — Progress Notes (Signed)
Subjective: Interval History: has no complaint of difficulty breathing; also no orthopnea or paroxysmal nocturnal dyspnea. Patient overall states that she is feeling better. She doesn't have any cough today..  Objective: Vital signs in last 24 hours: Temp:  [97.9 F (36.6 C)-98.3 F (36.8 C)] 98.3 F (36.8 C) (12/03 0400) Pulse Rate:  [65-82] 79  (12/03 0737) Resp:  [17-26] 19  (12/03 0737) BP: (103-190)/(44-77) 144/56 mmHg (12/03 0737) SpO2:  [88 %-100 %] 96 % (12/03 0737) FiO2 (%):  [35 %-60 %] 50 % (12/03 0737) Weight:  [60.8 kg (134 lb 0.6 oz)-62 kg (136 lb 11 oz)] 134 lb 0.6 oz (60.8 kg) (12/03 0500) Weight change: 2.125 kg (4 lb 11 oz)  Intake/Output from previous day: 12/02 0701 - 12/03 0700 In: 850 [P.O.:120; I.V.:380; IV Piggyback:350] Out: 2300  Intake/Output this shift:    General appearance: alert, cooperative and no distress Resp: clear to auscultation bilaterally Cardio: regular rate and rhythm, S1, S2 normal, no murmur, click, rub or gallop GI: soft, non-tender; bowel sounds normal; no masses,  no organomegaly Extremities: extremities normal, atraumatic, no cyanosis or edema  Lab Results:  [facility] 06/10/11 0512 06/09/11 0903  WBC 11.4* 12.4*  HGB 8.0* 8.4*  HCT 27.4* 27.7*  PLT 298 368   BMET:  Basename 06/10/11 0512 06/09/11 0934 06/09/11 0520  NA 140 -- 144  K 3.4* -- 3.1*  CL 100 -- 100  CO2 33* -- --  GLUCOSE 96 -- 158*  BUN 13 -- 11  CREATININE 2.99* 3.07* --  CALCIUM 8.2* -- --   No results found for this basename: PTH:2 in the last 72 hours Iron Studies: No results found for this basename: IRON,TIBC,TRANSFERRIN,FERRITIN in the last 72 hours  Studies/Results: Portable Chest Xray In Am  06/10/2011  *RADIOLOGY REPORT*  Clinical Data: Follow up of CHF.  Respiratory failure.  PORTABLE CHEST - 1 VIEW  Comparison: 1 day prior  Findings: Midline trachea.  Moderate cardiomegaly.  Increased small left and persistent small right pleural effusion. No  pneumothorax. No change in lower lobe predominant interstitial and airspace disease.  IMPRESSION:  1.  No change in congestive heart failure. 2.  Persistent small right and slight increase in small left pleural effusion. 3.  Lower lobe predominant airspace disease.  Favor atelectasis or alveolar edema.  Concurrent infection cannot be excluded.  Original Report Authenticated By: Consuello Bossier, [name]   Dg Chest Port 1 View  06/09/2011  *RADIOLOGY REPORT*  Clinical Data: Shortness of breath and cough.  PORTABLE CHEST - 1 VIEW  Comparison: Chest radiograph performed 07/25/2010  Findings: The lungs are relatively well expanded.  Diffuse hazy left-sided airspace opacification is noted, and patchy right perihilar and right basilar airspace opacities are seen.  Small bilateral pleural effusions are noted.  Findings raise concern for pulmonary edema, though superimposed pneumonia cannot be excluded. No pneumothorax is identified.  The cardiomediastinal silhouette is enlarged.  No acute osseous abnormalities are seen.  IMPRESSION: Diffuse hazy left-sided airspace opacification, and patchy right perihilar and right basilar airspace opacities, with small bilateral pleural effusions and underlying cardiomegaly.  Findings concerning for pulmonary edema, though superimposed pneumonia cannot be excluded.  Original Report Authenticated By: Tonia Ghent, [name]    I have reviewed the patient's current medications.  Assessment/Plan: Problem #1 difficulty increasing and cough possibly compression of CHF and pneumonia presently her patient feeling better.  Problem #2 anemia her hemoglobin is stable Problem #3 hypokalemia patient potassium has improved Problem #4 history of hypertension  her blood pressure seems to be controlled very well. Problem #5 history of her CHF status post ultrafiltration yesterday clinically she does have any sign of fluid overload and her O2 saturation is 97%.  Problem #6 hyperphosphatemia she is on  a binder her phosphorus is within normal range. Problem #7 history of pneumonia she is a febrile and her white blood cell count is improving. Presently she is on antibiotics. Recommendation we'll continue his present management We'll check her CBC and basic metabolic panel in the morning We'll make arrangements for patient to get dialysis tomorrow.   LOS: 1 day   Jodie Cavey S 06/10/2011,7:47 AM

## 2011-06-11 ENCOUNTER — Inpatient Hospital Stay (HOSPITAL_COMMUNITY): Payer: PRIVATE HEALTH INSURANCE

## 2011-06-11 LAB — BASIC METABOLIC PANEL WITH GFR
BUN: 24 mg/dL — ABNORMAL HIGH (ref 6–23)
CO2: 31 meq/L (ref 19–32)
Calcium: 7.9 mg/dL — ABNORMAL LOW (ref 8.4–10.5)
Chloride: 99 meq/L (ref 96–112)
Creatinine, Ser: 4.72 mg/dL — ABNORMAL HIGH (ref 0.50–1.10)
GFR calc Af Amer: 9 mL/min — ABNORMAL LOW
GFR calc non Af Amer: 8 mL/min — ABNORMAL LOW
Glucose, Bld: 96 mg/dL (ref 70–99)
Potassium: 3.4 meq/L — ABNORMAL LOW (ref 3.5–5.1)
Sodium: 139 meq/L (ref 135–145)

## 2011-06-11 LAB — CBC
HCT: 25.9 % — ABNORMAL LOW (ref 36.0–46.0)
Hemoglobin: 7.8 g/dL — ABNORMAL LOW (ref 12.0–15.0)
MCH: 28.6 pg (ref 26.0–34.0)
MCHC: 30.1 g/dL (ref 30.0–36.0)
MCV: 94.9 fL (ref 78.0–100.0)
Platelets: 326 K/uL (ref 150–400)
RBC: 2.73 MIL/uL — ABNORMAL LOW (ref 3.87–5.11)
RDW: 15.1 % (ref 11.5–15.5)
WBC: 11.5 K/uL — ABNORMAL HIGH (ref 4.0–10.5)

## 2011-06-11 MED ORDER — EPOETIN ALFA 10000 UNIT/ML IJ SOLN
14000.0000 [IU] | INTRAMUSCULAR | Status: DC
  Administered 2011-06-11: 14000 [IU] via INTRAVENOUS
  Filled 2011-06-11 (×2): qty 2

## 2011-06-11 MED ORDER — EPOETIN ALFA 10000 UNIT/ML IJ SOLN: 14000.0000 [IU] | INTRAMUSCULAR | Status: DC

## 2011-06-11 MED ORDER — ROSUVASTATIN CALCIUM 20 MG PO TABS
10.0000 mg | ORAL_TABLET | Freq: Every day | ORAL | Status: DC
  Administered 2011-06-11 – 2011-06-12 (×2): 10 mg via ORAL
  Filled 2011-06-11: qty 2
  Filled 2011-06-11: qty 1

## 2011-06-11 NOTE — Progress Notes (Signed)
Subjective: Interval History: has complaints of dry cough.  she denies any difficulty in breathing  also she denies any orthopnea or paroxysmal nocturnal dyspnea.  she didn't have any fever.  the cough was somewhat worse last night..  Objective: Vital signs in last 24 hours: Temp:  [98.1 F (36.7 C)-99.2 F (37.3 C)] 98.7 F (37.1 C) (12/04 0410) Pulse Rate:  [68-80] 73  (12/04 0600) Resp:  [18-26] 21  (12/04 0600) BP: (112-144)/(38-59) 114/45 mmHg (12/04 0600) SpO2:  [95 %-100 %] 96 % (12/04 0600) FiO2 (%):  [50 %] 50 % (12/03 0737) Weight:  [59 kg (130 lb 1.1 oz)] 130 lb 1.1 oz (59 kg) (12/04 0500) Weight change: -3 kg (-6 lb 9.8 oz)  Intake/Output from previous day: 12/03 0701 - 12/04 0700 In: 1995 [P.O.:1440; I.V.:555] Out: 250 [Urine:250] Intake/Output this shift: Total I/O In: 235 [I.V.:235] Out: -   General appearance: alert, cooperative and no distress Resp: clear to auscultation bilaterally Cardio: systolic murmur: holosystolic 3/6, medium pitch at 2nd left intercostal space, at 2nd right intercostal space GI: soft, non-tender; bowel sounds normal; no masses,  no organomegaly Extremities: extremities normal, atraumatic, no cyanosis or edema  Lab Results:  [facility] 06/11/11 0501 06/10/11 0512  WBC 11.5* 11.4*  HGB 7.8* 8.0*  HCT 25.9* 27.4*  PLT 326 298   BMET:  Basename 06/11/11 0501 06/10/11 0512  NA 139 140  K 3.4* 3.4*  CL 99 100  CO2 31 33*  GLUCOSE 96 96  BUN 24* 13  CREATININE 4.72* 2.99*  CALCIUM 7.9* 8.2*   No results found for this basename: PTH:2 in the last 72 hours Iron Studies: No results found for this basename: IRON,TIBC,TRANSFERRIN,FERRITIN in the last 72 hours  Studies/Results: Portable Chest Xray In Am  06/10/2011  *RADIOLOGY REPORT*  Clinical Data: Follow up of CHF.  Respiratory failure.  PORTABLE CHEST - 1 VIEW  Comparison: 1 day prior  Findings: Midline trachea.  Moderate cardiomegaly.  Increased small left and persistent small  right pleural effusion. No pneumothorax. No change in lower lobe predominant interstitial and airspace disease.  IMPRESSION:  1.  No change in congestive heart failure. 2.  Persistent small right and slight increase in small left pleural effusion. 3.  Lower lobe predominant airspace disease.  Favor atelectasis or alveolar edema.  Concurrent infection cannot be excluded.  Original Report Authenticated By: Consuello Bossier, [name]    I have reviewed the patient's current medications.  Assessment/Plan: Problem #1 end-stage renal disease patient is status post hemodialysis on Sunday her she is due for dialysis today her. Her BUN is 24 creatinine is 4.72 seems to be stable. Problem #2 hypokalemia potassium is 3.4 Problem #3 history of anemia her hemoglobin is 7.8 hematocrit 25.9 declining. Presently she is on Epogen. Problem #4 for pneumonia she is on antibiotics she is a febrile her white blood cell count is 11.5 remained stable. Problem #5 history of CHF presently she doesn't have any sign of fluid overload.  problem #6 history of hypertension her blood pressure seems to be controlled reasonably good.  Problem #7 history of metabolic acidosis patient on sodium bicarbonate her CO2 has gone up to 31.  Recommendation we'll do dialysis today We'll use 4 K2 0.5 calcium as We'll DC sodium bicarbonate We'll increase her Epogen We'll check her CBC and basic metabolic panel in the morning.   LOS: 2 days   Avarey Yaeger S 06/11/2011,6:54 AM

## 2011-06-11 NOTE — Progress Notes (Signed)
Subjective: This lady has clearly improved. She is not requiring BiPAP whatsoever overnight. She is due to have dialysis today again.           Physical Exam: Blood pressure 114/45, pulse 73, temperature 98.7 F (37.1 C), temperature source Oral, resp. rate 21, height 5\' 5"  (1.651 m), weight 59 kg (130 lb 1.1 oz), SpO2 96.00%. There is no increased work of breathing. Lung fields show bilateral bronchial breathing more on the left and the right. There are a few inspiratory crackles noted. Heart sounds are present with a harsh aortic systolic murmur radiating to both carotids. She is alert and orientated.   Investigations:  Recent Results (from the past 240 hour(s))  MRSA PCR SCREENING     Status: Normal   Collection Time   06/09/11  8:50 AM      Component Value Range Status Comment   MRSA by PCR NEGATIVE  NEGATIVE  Final   CULTURE, BLOOD (ROUTINE X 2)     Status: Normal (Preliminary result)   Collection Time   06/09/11 11:25 AM      Component Value Range Status Comment   Specimen Description BLOOD LEFT HAND   Final    Special Requests     Final    Value: BOTTLES DRAWN AEROBIC AND ANAEROBIC 6CC EACH BOTTLE   Culture NO GROWTH 1 DAY   Final    Report Status PENDING   Incomplete   CULTURE, BLOOD (ROUTINE X 2)     Status: Normal (Preliminary result)   Collection Time   06/09/11 11:35 AM      Component Value Range Status Comment   Specimen Description BLOOD LEFT HAND   Final    Special Requests     Final    Value: BOTTLES DRAWN AEROBIC AND ANAEROBIC 8CC EACH BOTTLE   Culture NO GROWTH 1 DAY   Final    Report Status PENDING   Incomplete      Basic Metabolic Panel:  Basename 06/11/11 0501 06/10/11 0512  NA 139 140  K 3.4* 3.4*  CL 99 100  CO2 31 33*  GLUCOSE 96 96  BUN 24* 13  CREATININE 4.72* 2.99*  CALCIUM 7.9* 8.2*  MG -- --  PHOS -- 2.8   Liver Function Tests:  [facility] 06/10/11 0512  AST 11  ALT 6  ALKPHOS 47  BILITOT 0.3  PROT 6.1  ALBUMIN 2.7*      CBC:  Basename 06/11/11 0501 06/10/11 0512 06/09/11 0501  WBC 11.5* 11.4* --  NEUTROABS -- -- 11.7*  HGB 7.8* 8.0* --  HCT 25.9* 27.4* --  MCV 94.9 95.8 --  PLT 326 298 --    Portable Chest Xray In Am  06/10/2011  *RADIOLOGY REPORT*  Clinical Data: Follow up of CHF.  Respiratory failure.  PORTABLE CHEST - 1 VIEW  Comparison: 1 day prior  Findings: Midline trachea.  Moderate cardiomegaly.  Increased small left and persistent small right pleural effusion. No pneumothorax. No change in lower lobe predominant interstitial and airspace disease.  IMPRESSION:  1.  No change in congestive heart failure. 2.  Persistent small right and slight increase in small left pleural effusion. 3.  Lower lobe predominant airspace disease.  Favor atelectasis or alveolar edema.  Concurrent infection cannot be excluded.  Original Report Authenticated By: Consuello Bossier, [name]   [id number]  ------------------------------------------------------------ Transthoracic Echocardiography  Patient: [name], [name] MR #: [id number] Study Date: 06/10/2011 Gender: F Age: 77 Height: 165.1cm Weight: 60.8kg BSA: 1.53m^2 Pt. Status:  Room: APA09  SONOGRAPHER [facility], Liyah Higham C REFERRING [place], [place] C ATTENDING Benjiman Core PERFORMING Delma Freeze Penn cc:  ------------------------------------------------------------ LV EF: 60% - 65%  ------------------------------------------------------------ Indications: CHF - 428.0.  ------------------------------------------------------------ History: PMH: Congestive heart failure. PMH: anemia, ESRD Risk factors: Hypertension.  ------------------------------------------------------------ Study Conclusions  - Left ventricle: The cavity size was normal. There was moderate focal basal hypertrophy of the septum. Systolic function was normal. The estimated ejection fraction was in the range of 60% to 65%. Wall motion was normal;  there were no regional wall motion abnormalities. Features are consistent with a pseudonormal left ventricular filling pattern, with concomitant abnormal relaxation and increased filling pressure (grade 2 diastolic dysfunction). - Aortic valve: Cusp separation was reduced. There was mild stenosis. Trivial regurgitation. Mean gradient: 19mm Hg (S). Peak gradient: 29mm Hg (S). - Mitral valve: Calcified annulus. Mildly thickened, moderately calcified leaflets . Moderate regurgitation. - Left atrium: The atrium was moderately dilated. - Tricuspid valve: Mild regurgitation. - Pulmonary arteries: Systolic pressure was moderately increased. PA peak pressure: 58mm Hg (S). - Pericardium, extracardiac: A small pericardial effusion was identified. There was a left pleural effusion. Transthoracic echocardiography. M-mode, complete 2D, spectral Doppler, and color Doppler. Height: Height: 165.1cm. Height: 65in. Weight: Weight: 60.8kg. Weight: 133.7lb. Body mass index: BMI: 22.3kg/m^2. Body surface area: BSA: 1.7m^2. Patient status: Inpatient.     Medications: I have reviewed the patient's current medications.  Impression: 1. Diastolic congestive heart failure. 2. Pneumonia. 3. End stage renal disease on hemodialysis. 4. Hypertension. 5. Anemia of chronic disease. 6. Aortic systolic heart murmur, mild aortic stenosis.     Plan: 1. Continue current treatment. 2. Cardiology consultation. 3. Dialysis today. 4. Move to telemetry floor today.     LOS: 2 days   Tahjay Binion C 06/11/2011, 7:42 AM

## 2011-06-11 NOTE — Progress Notes (Signed)
Report called to RN. Pt to be transferred to room 306 per MD order. Pt to be transferred via wheelchair. Family members at bedside and aware of transfer.

## 2011-06-11 NOTE — Consult Note (Signed)
Reason for Consult congestive heart failure. Referring Physician: Dr. Patterson Hammersmith is an 77 y.o. female.  HPI: [name] is a 77 year old Afro-American mother with 34 grandchildren. She is a medical patient of Dr. hill and has been followed by Dr. Gery Pray [facility] as an outpatient. She's been followed for hypertension, hyperlipidemia, chronic renal insufficiency and mild aortic stenosis  She was last seen in September 2011 and felt to be doing well clinically with a recent echo of April 2011 showing mild aortic valvular stenosis with a CW doppler of her gradient of 2.33 m/s. She was getting ready to start dialysis through a new left upper extremity AV fistula.  She does not have a known history of coronary disease. She has LVH on EKG and blood pressure was for stable.  She was admitted several days ago with sudden onset of shortness of breath. She had congestive heart failure on exam and x-ray with small bilateral pleural effusions.  Fortunately she has responded well to dialysis for fluid overload. Blood pressure is been under good control post dialysis. She's not had any chest pain or arrhythmias noted and has remained in sinus rhythm. 2-D echo shows mild aortic stenosis which is essentially unchanged.  She tolerated dialysis well this morning and has maintained sinus rhythm. There've been no chest pain no cough the patient's been no recent patient's symptoms of coronary disease . EKG shows sinus rhythm with mild LVH no diagnostic Q waves and nonspecific ST segment changes. He has mild chronic anemia with no history of recent bleeding. She said a negative Myoview for ischemia remotely 2008 and chest pain or symptoms to suggest coronary disease at present. For 2-3 L of fluid removed with dialysis that was uneventful. Patient Active Problem List  Diagnoses  . Anemia  . CHF (congestive heart failure)  . Pneumonia  . HTN (hypertension)  . ESRD (end stage renal disease) on dialysis      Subjective: Is currently comfortable in the after finishing dialysis.  Objective: Subjective: No acute complaints  Objective: Vital signs in last 24 hours: Temp:  [97.6 F (36.4 C)-99.2 F (37.3 C)] 98 F (36.7 C) (12/04 1240) Pulse Rate:  [65-77] 72  (12/04 1200) Resp:  [18-26] 18  (12/04 1200) BP: (112-143)/(38-62) 143/55 mmHg (12/04 1240) SpO2:  [96 %-100 %] 100 % (12/04 1200) Weight:  [58 kg (127 lb 13.9 oz)-60.7 kg (133 lb 13.1 oz)] 127 lb 13.9 oz (58 kg) (12/04 1240) Weight change: -3 kg (-6 lb 9.8 oz) Last BM Date: 06/08/11  Intake/Output from previous day: 12/03 0701 - 12/04 0700 In: 1995 [P.O.:1440; I.V.:555] Out: 250 [Urine:250] Intake/Output this shift: Total I/O In: 0  Out: 3000 [Other:3000]  General appearance: alert see below. Lab Results:  [facility] 06/11/11 0501 06/10/11 0512  WBC 11.5* 11.4*  HGB 7.8* 8.0*  HCT 25.9* 27.4*  PLT 326 298   BMET  Basename 06/11/11 0501 06/10/11 0512  NA 139 140  K 3.4* 3.4*  CL 99 100  CO2 31 33*  GLUCOSE 96 96  BUN 24* 13  CREATININE 4.72* 2.99*  CALCIUM 7.9* 8.2*   Cardiac Enzymes Lab Results  Component Value Date   CKTOTAL 60 06/09/2011   CKMB 2.5 06/09/2011   TROPONINI <0.30 06/09/2011    Studies/Results: Portable Chest Xray In Am  06/10/2011  *RADIOLOGY REPORT*  Clinical Data: Follow up of CHF.  Respiratory failure.  PORTABLE CHEST - 1 VIEW  Comparison: 1 day prior  Findings: Midline  trachea.  Moderate cardiomegaly.  Increased small left and persistent small right pleural effusion. No pneumothorax. No change in lower lobe predominant interstitial and airspace disease.  IMPRESSION:  1.  No change in congestive heart failure. 2.  Persistent small right and slight increase in small left pleural effusion. 3.  Lower lobe predominant airspace disease.  Favor atelectasis or alveolar edema.  Concurrent infection cannot be excluded.  Original Report Authenticated By: Consuello Bossier, [name]    Orders placed  during the hospital encounter of 06/09/11  . EKG    Medications: I have reviewed the patient's current medications.  Assessment/Plan: On exam she is due for over 2 in [place] regular sinus rhythm we'll monitor and her blood pressure is stable at 130/70 her chest is clear. There is a 20-30. She has left upper extremity AV fistula for dialysis. Original would deem her at right posterior tibial pulses absent left dorsalis pedis pulses absent right DP and PT pulses were present during the lower extremity ulceration  P.m. he is in the left fifth she is in the axial filters to moderately sustained he there is a 2/6 musical systolic murmur at the right and left sternal border and base radiating faintly to the carotids. There is good carotid upstroke there is no aortic regurgitation on auscultation there is no S3 and there was mild JVD. No jaundice or ascites liver is palpable to costal origin and nontender the spleen is palpable bowel sounds are normal.  No pathologic reflexes  as she is oriented x3  LOS: 2 days   [name] has responded well to dialysis therapy for her fluid with fluid overload with normal left ventricular function and mild aortic valvular stenosis fusion are suffered myocardial infarction or any significant myocardial ischemia. If hypertension becomes a problem post room between dialysis she can be short of ACE inhibitor or ARB. She has been on long acting calcium blocker nifedipine as an outpatient along with labetalol 200 twice a day. Agree with her current medications. We would be happy to continue to follow during hospitalization and felt with her medical therapy. Please call if there are any questions or concerns.  Alanda Amass, Ketara Cavness A 06/11/2011, 1:14 PM    Vital signs in last 24 hours: Temp:  [97.6 F (36.4 C)-99.2 F (37.3 C)] 98 F (36.7 C) (12/04 1240) Pulse Rate:  [65-77] 72  (12/04 1200) Resp:  [18-26] 18  (12/04 1200) BP: (112-143)/(38-62) 143/55 mmHg (12/04  1240) SpO2:  [96 %-100 %] 100 % (12/04 1200) Weight:  [58 kg (127 lb 13.9 oz)-60.7 kg (133 lb 13.1 oz)] 127 lb 13.9 oz (58 kg) (12/04 1240) Weight change: -3 kg (-6 lb 9.8 oz) Last BM Date: 06/08/11  Intake/Output from previous day: 12/03 0701 - 12/04 0700 In: 1995 [P.O.:1440; I.V.:555] Out: 250 [Urine:250] Intake/Output this shift: Total I/O In: 0  Out: 3000 [Other:3000]  Head: Normocephalic, without obvious abnormality, atraumatic Cardio: systolic murmur: systolic ejection 2/6, musical at lower left sternal border see below.  Lab Results:  [facility] 06/11/11 0501 06/10/11 0512  WBC 11.5* 11.4*  HGB 7.8* 8.0*  HCT 25.9* 27.4*  PLT 326 298   BMET  Basename 06/11/11 0501 06/10/11 0512  NA 139 140  K 3.4* 3.4*  CL 99 100  CO2 31 33*  GLUCOSE 96 96  BUN 24* 13  CREATININE 4.72* 2.99*  CALCIUM 7.9* 8.2*   Cardiac Enzymes Lab Results  Component Value Date   CKTOTAL 60 06/09/2011   CKMB 2.5 06/09/2011  TROPONINI <0.30 06/09/2011    Studies/Results: Portable Chest Xray In Am  06/10/2011  *RADIOLOGY REPORT*  Clinical Data: Follow up of CHF.  Respiratory failure.  PORTABLE CHEST - 1 VIEW  Comparison: 1 day prior  Findings: Midline trachea.  Moderate cardiomegaly.  Increased small left and persistent small right pleural effusion. No pneumothorax. No change in lower lobe predominant interstitial and airspace disease.  IMPRESSION:  1.  No change in congestive heart failure. 2.  Persistent small right and slight increase in small left pleural effusion. 3.  Lower lobe predominant airspace disease.  Favor atelectasis or alveolar edema.  Concurrent infection cannot be excluded.  Original Report Authenticated By: Consuello Bossier, [name]    Orders placed during the hospital encounter of 06/09/11  . EKG    Medications:  I have reviewed the patient's current medications. Prior to Admission:  Prescriptions prior to admission  Medication Sig Dispense Refill  . atorvastatin (LIPITOR) 40  MG tablet Take 40 mg by mouth daily.        Marland Kitchen azithromycin (ZITHROMAX) 250 MG tablet Take 250-500 mg by mouth daily. Take 2 tablets (500mg ) on day one. Then take 1 tablet (250mg ) for four days. Started on 06/05/11, last dose due 06/09/11.       . benzonatate (TESSALON) 100 MG capsule Take 100 mg by mouth 3 (three) times daily as needed. For cough       . calcitRIOL (ROCALTROL) 0.5 MCG capsule Take 0.5 mcg by mouth daily.       Marland Kitchen labetalol (NORMODYNE) 200 MG tablet Take 200 mg by mouth 2 (two) times daily.        Marland Kitchen NIFEdipine (PROCARDIA XL/ADALAT-CC) 90 MG 24 hr tablet Take 90 mg by mouth daily.       . sodium bicarbonate 650 MG tablet Take 1,300 mg by mouth 3 (three) times daily.          Assessment/Plan: See above dictation.  LOS: 2 days   Susa Griffins A 06/11/2011, 1:14 PM    Past Medical History  Diagnosis Date  . Anemia 03/06/2011  . Hypertension   . Coronary artery disease   . Renal disorder     History reviewed. No pertinent past surgical history.  History reviewed. No pertinent family history.  Social History:  reports that she has never smoked. She does not have any smokeless tobacco history on file. She reports that she does not drink alcohol or use illicit drugs.  Allergies: No Known Allergies  Medications: I have reviewed the patient's current medications.  Results for orders placed during the hospital encounter of 06/09/11 (from the past 48 hour(s))  CARDIAC PANEL(CRET KIN+CKTOT+MB+TROPI)     Status: Abnormal   Collection Time   06/09/11  3:48 PM      Component Value Range Comment   Total CK 71  7 - 177 (U/L)    CK, MB 3.3  0.3 - 4.0 (ng/mL)    Troponin I 0.30 (*) <0.30 (ng/mL)    Relative Index RELATIVE INDEX IS INVALID  0.0 - 2.5    CARDIAC PANEL(CRET KIN+CKTOT+MB+TROPI)     Status: Normal   Collection Time   06/09/11 11:52 PM      Component Value Range Comment   Total CK 60  7 - 177 (U/L)    CK, MB 2.5  0.3 - 4.0 (ng/mL)    Troponin I <0.30  <0.30  (ng/mL)    Relative Index RELATIVE INDEX IS INVALID  0.0 - 2.5  PRO B NATRIURETIC PEPTIDE     Status: Abnormal   Collection Time   06/10/11  5:12 AM      Component Value Range Comment   BNP, POC 27121.0 (*) 0 - 450 (pg/mL)   PHOSPHORUS     Status: Normal   Collection Time   06/10/11  5:12 AM      Component Value Range Comment   Phosphorus 2.8  2.3 - 4.6 (mg/dL)   CBC     Status: Abnormal   Collection Time   06/10/11  5:12 AM      Component Value Range Comment   WBC 11.4 (*) 4.0 - 10.5 (K/uL)    RBC 2.86 (*) 3.87 - 5.11 (MIL/uL)    Hemoglobin 8.0 (*) 12.0 - 15.0 (g/dL)    HCT 16.1 (*) 09.6 - 46.0 (%)    MCV 95.8  78.0 - 100.0 (fL)    MCH 28.0  26.0 - 34.0 (pg)    MCHC 29.2 (*) 30.0 - 36.0 (g/dL)    RDW 04.5  40.9 - 81.1 (%)    Platelets 298  150 - 400 (K/uL)   COMPREHENSIVE METABOLIC PANEL     Status: Abnormal   Collection Time   06/10/11  5:12 AM      Component Value Range Comment   Sodium 140  135 - 145 (mEq/L)    Potassium 3.4 (*) 3.5 - 5.1 (mEq/L)    Chloride 100  96 - 112 (mEq/L)    CO2 33 (*) 19 - 32 (mEq/L)    Glucose, Bld 96  70 - 99 (mg/dL)    BUN 13  6 - 23 (mg/dL)    Creatinine, Ser 9.14 (*) 0.50 - 1.10 (mg/dL)    Calcium 8.2 (*) 8.4 - 10.5 (mg/dL)    Total Protein 6.1  6.0 - 8.3 (g/dL)    Albumin 2.7 (*) 3.5 - 5.2 (g/dL)    AST 11  0 - 37 (U/L)    ALT 6  0 - 35 (U/L)    Alkaline Phosphatase 47  39 - 117 (U/L)    Total Bilirubin 0.3  0.3 - 1.2 (mg/dL)    GFR calc non Af Amer 14 (*) >90 (mL/min)    GFR calc Af Amer 16 (*) >90 (mL/min)   BLOOD GAS, ARTERIAL     Status: Abnormal   Collection Time   06/10/11  6:00 AM      Component Value Range Comment   FIO2 .35      Delivery systems OXYGEN MASK      pH, Arterial 7.470 (*) 7.350 - 7.400     pCO2 arterial 43.7  35.0 - 45.0 (mmHg)    pO2, Arterial 57.7 (*) 80.0 - 100.0 (mmHg)    Bicarbonate 31.4 (*) 20.0 - 24.0 (mEq/L)    TCO2 29.7  0 - 100 (mmol/L)    Acid-Base Excess 7.5 (*) 0.0 - 2.0 (mmol/L)    O2  Saturation 90.4      Patient temperature 37.0      Collection site RIGHT RADIAL      Drawn by COLLECTED BY RT      Sample type ARTERIAL      Allens test (pass/fail) PASS  PASS    BASIC METABOLIC PANEL     Status: Abnormal   Collection Time   06/11/11  5:01 AM      Component Value Range Comment   Sodium 139  135 - 145 (mEq/L)    Potassium 3.4 (*) 3.5 -  5.1 (mEq/L)    Chloride 99  96 - 112 (mEq/L)    CO2 31  19 - 32 (mEq/L)    Glucose, Bld 96  70 - 99 (mg/dL)    BUN 24 (*) 6 - 23 (mg/dL) DELTA CHECK NOTED   Creatinine, Ser 4.72 (*) 0.50 - 1.10 (mg/dL)    Calcium 7.9 (*) 8.4 - 10.5 (mg/dL)    GFR calc non Af Amer 8 (*) >90 (mL/min)    GFR calc Af Amer 9 (*) >90 (mL/min)   CBC     Status: Abnormal   Collection Time   06/11/11  5:01 AM      Component Value Range Comment   WBC 11.5 (*) 4.0 - 10.5 (K/uL)    RBC 2.73 (*) 3.87 - 5.11 (MIL/uL)    Hemoglobin 7.8 (*) 12.0 - 15.0 (g/dL)    HCT 16.1 (*) 09.6 - 46.0 (%)    MCV 94.9  78.0 - 100.0 (fL)    MCH 28.6  26.0 - 34.0 (pg)    MCHC 30.1  30.0 - 36.0 (g/dL)    RDW 04.5  40.9 - 81.1 (%)    Platelets 326  150 - 400 (K/uL)     Portable Chest Xray In Am  06/10/2011  *RADIOLOGY REPORT*  Clinical Data: Follow up of CHF.  Respiratory failure.  PORTABLE CHEST - 1 VIEW  Comparison: 1 day prior  Findings: Midline trachea.  Moderate cardiomegaly.  Increased small left and persistent small right pleural effusion. No pneumothorax. No change in lower lobe predominant interstitial and airspace disease.  IMPRESSION:  1.  No change in congestive heart failure. 2.  Persistent small right and slight increase in small left pleural effusion. 3.  Lower lobe predominant airspace disease.  Favor atelectasis or alveolar edema.  Concurrent infection cannot be excluded.  Original Report Authenticated By: Consuello Bossier, [name]    Review of Systems  Constitutional: Positive for malaise/fatigue. Negative for fever.  HENT: Positive for congestion.   Respiratory:  Positive for cough. Negative for sputum production.   Cardiovascular: Negative for chest pain and palpitations.  Gastrointestinal: Negative for heartburn, nausea and vomiting.  Neurological: Negative for dizziness.  All other systems reviewed and are negative.   Blood pressure 140/53, pulse 72, temperature 97.6 F (36.4 C), temperature source Oral, resp. rate 18, height 5\' 5"  (1.651 m), weight 60.7 kg (133 lb 13.1 oz), SpO2 100.00%. Physical Exam  Assessment/Plan: See above dictation. Alanda Amass, Samaad Hashem A 06/11/2011, 12:57 PM

## 2011-06-11 NOTE — Progress Notes (Signed)
[facility] per MD order for cardiology consult.

## 2011-06-11 NOTE — Progress Notes (Signed)
Subjective: She is overall better. She has no new complaints. She is off BiPAP and is due for dialysis.   Objective: Vital signs in last 24 hours: Temp:  [98.5 F (36.9 C)-99.2 F (37.3 C)] 98.7 F (37.1 C) (12/04 0410) Pulse Rate:  [68-80] 73  (12/04 0600) Resp:  [18-26] 21  (12/04 0600) BP: (112-134)/(38-56) 114/45 mmHg (12/04 0600) SpO2:  [96 %-100 %] 96 % (12/04 0600) Weight:  [59 kg (130 lb 1.1 oz)] 130 lb 1.1 oz (59 kg) (12/04 0500) Weight change: -3 kg (-6 lb 9.8 oz) Last BM Date: 06/08/11  Intake/Output from previous day: 12/03 0701 - 12/04 0700 In: 1995 [P.O.:1440; I.V.:555] Out: 250 [Urine:250]  PHYSICAL EXAM General appearance: alert, cooperative and no distress Resp: clear to auscultation bilaterally Cardio: regular rate and rhythm, S1, S2 normal, no murmur, click, rub or gallop GI: soft, non-tender; bowel sounds normal; no masses,  no organomegaly Extremities: extremities normal, atraumatic, no cyanosis or edema  Lab Results:    Basic Metabolic Panel:  Basename 06/11/11 0501 06/10/11 0512  NA 139 140  K 3.4* 3.4*  CL 99 100  CO2 31 33*  GLUCOSE 96 96  BUN 24* 13  CREATININE 4.72* 2.99*  CALCIUM 7.9* 8.2*  MG -- --  PHOS -- 2.8   Liver Function Tests:  Basename 06/10/11 0512  AST 11  ALT 6  ALKPHOS 47  BILITOT 0.3  PROT 6.1  ALBUMIN 2.7*   No results found for this basename: LIPASE:2,AMYLASE:2 in the last 72 hours No results found for this basename: AMMONIA:2 in the last 72 hours CBC:  Basename 06/11/11 0501 06/10/11 0512 06/09/11 0501  WBC 11.5* 11.4* --  NEUTROABS -- -- 11.7*  HGB 7.8* 8.0* --  HCT 25.9* 27.4* --  MCV 94.9 95.8 --  PLT 326 298 --   Cardiac Enzymes:  Basename 06/09/11 2352 06/09/11 1548 06/09/11 0909  CKTOTAL 60 71 75  CKMB 2.5 3.3 3.4  CKMBINDEX -- -- --  TROPONINI <0.30 0.30* <0.30   BNP:  Basename 06/10/11 0512  POCBNP 27121.0*   D-Dimer: No results found for this basename: DDIMER:2 in the last 72  hours CBG: No results found for this basename: GLUCAP:6 in the last 72 hours Hemoglobin A1C: No results found for this basename: HGBA1C in the last 72 hours Fasting Lipid Panel: No results found for this basename: CHOL,HDL,LDLCALC,TRIG,CHOLHDL,LDLDIRECT in the last 72 hours Thyroid Function Tests: No results found for this basename: TSH,T4TOTAL,FREET4,T3FREE,THYROIDAB in the last 72 hours Anemia Panel: No results found for this basename: VITAMINB12,FOLATE,FERRITIN,TIBC,IRON,RETICCTPCT in the last 72 hours Coagulation: No results found for this basename: LABPROT:2,INR:2 in the last 72 hours Urine Drug Screen: Drugs of Abuse  No results found for this basename: labopia, cocainscrnur, labbenz, amphetmu, thcu, labbarb    Alcohol Level: No results found for this basename: ETH:2 in the last 72 hours Urinalysis:  Misc. Labs:  ABGS  Basename 06/10/11 0600  PHART 7.470*  PO2ART 57.7*  TCO2 29.7  HCO3 31.4*   CULTURES Recent Results (from the past 240 hour(s))  MRSA PCR SCREENING     Status: Normal   Collection Time   06/09/11  8:50 AM      Component Value Range Status Comment   MRSA by PCR NEGATIVE  NEGATIVE  Final   CULTURE, BLOOD (ROUTINE X 2)     Status: Normal (Preliminary result)   Collection Time   06/09/11 11:25 AM      Component Value Range Status Comment   Specimen  Description BLOOD LEFT HAND   Final    Special Requests     Final    Value: BOTTLES DRAWN AEROBIC AND ANAEROBIC 6CC EACH BOTTLE   Culture NO GROWTH 1 DAY   Final    Report Status PENDING   Incomplete   CULTURE, BLOOD (ROUTINE X 2)     Status: Normal (Preliminary result)   Collection Time   06/09/11 11:35 AM      Component Value Range Status Comment   Specimen Description BLOOD LEFT HAND   Final    Special Requests     Final    Value: BOTTLES DRAWN AEROBIC AND ANAEROBIC 8CC EACH BOTTLE   Culture NO GROWTH 1 DAY   Final    Report Status PENDING   Incomplete    Studies/Results: Portable Chest Xray In  Am  06/10/2011  *RADIOLOGY REPORT*  Clinical Data: Follow up of CHF.  Respiratory failure.  PORTABLE CHEST - 1 VIEW  Comparison: 1 day prior  Findings: Midline trachea.  Moderate cardiomegaly.  Increased small left and persistent small right pleural effusion. No pneumothorax. No change in lower lobe predominant interstitial and airspace disease.  IMPRESSION:  1.  No change in congestive heart failure. 2.  Persistent small right and slight increase in small left pleural effusion. 3.  Lower lobe predominant airspace disease.  Favor atelectasis or alveolar edema.  Concurrent infection cannot be excluded.  Original Report Authenticated By: Consuello Bossier, [name]    Medications:  Scheduled:   . antiseptic oral rinse  15 mL Mouth Rinse QID  . azithromycin  500 mg Intravenous Q24H  . calcitRIOL  0.5 mcg Oral Daily  . cefTRIAXone (ROCEPHIN)  IV  1 g Intravenous Q24H  . epoetin alfa  14,000 Units Intravenous 3 times weekly  . heparin  5,000 Units Subcutaneous Q8H  . heparin  100 Units/kg Dialysis Once in dialysis  . labetalol  200 mg Oral BID  . NIFEdipine  90 mg Oral Daily  . rosuvastatin  10 mg Oral Daily  . DISCONTD: epoetin alfa  10,000 Units Intravenous 3 times weekly  . DISCONTD: sodium bicarbonate  650 mg Oral TID   Continuous:  WUJ:[id number] chloride, heparin, heparin  Assesment: She was admitted with respiratory failure. She has a likelihood of some partially treated pneumonia and I think she was also volume overloaded. She has improved markedly and is now off BiPAP. She says she will be moved to the floor later today. She is going to have dialysis. Principal Problem:  *CHF (congestive heart failure) Active Problems:  Anemia  Pneumonia  HTN (hypertension)  ESRD (end stage renal disease) on dialysis    Plan: I will plan to continue treatments and I will follow somewhat more peripherally since she is so much improved.    LOS: 2 days   Harish Bram L 06/11/2011, 8:00 AM

## 2011-06-12 ENCOUNTER — Inpatient Hospital Stay (HOSPITAL_COMMUNITY): Payer: PRIVATE HEALTH INSURANCE

## 2011-06-12 ENCOUNTER — Encounter (HOSPITAL_COMMUNITY): Payer: Self-pay | Admitting: Internal Medicine

## 2011-06-12 LAB — BASIC METABOLIC PANEL
CO2: 30 mEq/L (ref 19–32)
Calcium: 8.4 mg/dL (ref 8.4–10.5)
Chloride: 100 mEq/L (ref 96–112)
Creatinine, Ser: 3.81 mg/dL — ABNORMAL HIGH (ref 0.50–1.10)
Glucose, Bld: 99 mg/dL (ref 70–99)
Sodium: 138 mEq/L (ref 135–145)

## 2011-06-12 LAB — CBC
HCT: 27.7 % — ABNORMAL LOW (ref 36.0–46.0)
MCH: 28.4 pg (ref 26.0–34.0)
MCV: 94.9 fL (ref 78.0–100.0)
Platelets: 225 10*3/uL (ref 150–400)
RBC: 2.92 MIL/uL — ABNORMAL LOW (ref 3.87–5.11)
WBC: 11.9 10*3/uL — ABNORMAL HIGH (ref 4.0–10.5)

## 2011-06-12 MED ORDER — AZITHROMYCIN 250 MG PO TABS
500.0000 mg | ORAL_TABLET | Freq: Every day | ORAL | Status: DC
  Administered 2011-06-12: 500 mg via ORAL
  Filled 2011-06-12: qty 2

## 2011-06-12 MED ORDER — LEVOFLOXACIN 500 MG PO TABS: 500.0000 mg | ORAL_TABLET | ORAL | Status: DC

## 2011-06-12 NOTE — Progress Notes (Signed)
PHARMACIST - PHYSICIAN COMMUNICATION DR:   TRH CONCERNING: Antibiotic IV to Oral Route Change Policy  RECOMMENDATION: This patient is receiving Zithromax by the intravenous route.  Based on criteria approved by the Pharmacy and Therapeutics Committee, the antibiotic(s) is/are being converted to the equivalent oral dose form(s).   DESCRIPTION: These criteria include:  Patient being treated for a respiratory tract infection, urinary tract infection, or cellulitis  The patient is not neutropenic and does not exhibit a GI malabsorption state  The patient is eating (either orally or via tube) and/or has been taking other orally administered medications for a least 24 hours  The patient is improving clinically and has a Tmax < 100.5  If you have questions about this conversion, please contact the Pharmacy Department  [x]   [phone] )  [facility] []   [phone] )  [facility]  []   [phone] )  [facility] []   [phone] )  [facility]

## 2011-06-12 NOTE — Progress Notes (Signed)
[facility]  DAILY PROGRESS NOTE   Subjective:  No complaints, she is breathing better  Objective:  Temp:  [98 F (36.7 C)-98.6 F (37 C)] 98.2 F (36.8 C) (12/05 0518) Pulse Rate:  [69-77] 71  (12/05 0817) Resp:  [16-20] 16  (12/05 0518) BP: (113-153)/(52-62) 113/62 mmHg (12/05 0518) SpO2:  [97 %-100 %] 98 % (12/05 0817) Weight:  [58 kg (127 lb 13.9 oz)] 127 lb 13.9 oz (58 kg) (12/04 1240) Weight change: 1.7 kg (3 lb 12 oz)  Intake/Output from previous day: 12/04 0701 - 12/05 0700 In: 365 [P.O.:245; I.V.:120] Out: 3000   Intake/Output from this shift:    Medications: Current Facility-Administered Medications  Medication Dose Route Frequency Provider Last Rate Last Dose  . 0.9 %  sodium chloride infusion  250 mL Intravenous PRN Nimish C Gosrani 10 mL/hr at 06/12/11 0700 250 mL at 06/12/11 0700  . antiseptic oral rinse (BIOTENE) solution 15 mL  15 mL Mouth Rinse QID Vania Rea   15 mL at 06/12/11 1010  . azithromycin (ZITHROMAX) tablet 500 mg  500 mg Oral Daily Mady Gemma, PHARMD   500 mg at 06/12/11 1008  . calcitRIOL (ROCALTROL) capsule 0.5 mcg  0.5 mcg Oral Daily Nimish C Gosrani   0.5 mcg at 06/12/11 1008  . cefTRIAXone (ROCEPHIN) 1 g in dextrose 5 % 50 mL IVPB  1 g Intravenous Q24H Nimish C Gosrani   1 g at 06/11/11 0645  . epoetin alfa (EPOGEN,PROCRIT) injection 14,000 Units  14,000 Units Intravenous Custom Belayenh S Befekadu   14,000 Units at 06/11/11 0930  . heparin injection 1,000 Units  1,000 Units Dialysis PRN Belayenh S Befekadu   1,000 Units at 06/11/11 0956  . heparin injection 5,000 Units  5,000 Units Subcutaneous Q8H Nimish C Gosrani   5,000 Units at 06/12/11 0628  . heparin injection 500 Units  500 Units Dialysis PRN Belayenh S Befekadu   500 Units at 06/11/11 1111  . heparin injection 6,100 Units  100 Units/kg Dialysis Once in dialysis Belayenh S Befekadu      . labetalol (NORMODYNE) tablet 200 mg  200 mg Oral BID  Nimish C Gosrani   200 mg at 06/12/11 1009  . NIFEdipine (PROCARDIA XL/ADALAT-CC) 24 hr tablet 90 mg  90 mg Oral Daily Nimish C Gosrani   90 mg at 06/12/11 1008  . rosuvastatin (CRESTOR) tablet 10 mg  10 mg Oral Daily Francisco J Valls, PHARMD   10 mg at 06/12/11 1009  . DISCONTD: azithromycin (ZITHROMAX) 500 mg in dextrose 5 % 250 mL IVPB  500 mg Intravenous Q24H Nimish C Gosrani   500 mg at 06/11/11 1251    Physical Exam: General appearance: alert and no distress Neck: no adenopathy, no carotid bruit, no JVD, supple, symmetrical, trachea midline and thyroid not enlarged, symmetric, no tenderness/mass/nodules Lungs: clear to auscultation bilaterally Heart: S1, S2 normal and systolic murmur: late systolic 3/6, musical at 2nd right intercostal space Abdomen: soft, non-tender; bowel sounds normal; no masses,  no organomegaly Extremities: extremities normal, atraumatic, no cyanosis or edema Pulses: 2+ and symmetric Skin: Skin color, texture, turgor normal. No rashes or lesions or intact fistula in left upper arm with + thrill Neurologic: Grossly normal  Lab Results: Results for orders placed during the hospital encounter of 06/09/11 (from the past 48 hour(s))  BASIC METABOLIC PANEL     Status: Abnormal   Collection Time   06/11/11  5:01 AM  Component Value Range Comment   Sodium 139  135 - 145 (mEq/L)    Potassium 3.4 (*) 3.5 - 5.1 (mEq/L)    Chloride 99  96 - 112 (mEq/L)    CO2 31  19 - 32 (mEq/L)    Glucose, Bld 96  70 - 99 (mg/dL)    BUN 24 (*) 6 - 23 (mg/dL) DELTA CHECK NOTED   Creatinine, Ser 4.72 (*) 0.50 - 1.10 (mg/dL)    Calcium 7.9 (*) 8.4 - 10.5 (mg/dL)    GFR calc non Af Amer 8 (*) >90 (mL/min)    GFR calc Af Amer 9 (*) >90 (mL/min)   CBC     Status: Abnormal   Collection Time   06/11/11  5:01 AM      Component Value Range Comment   WBC 11.5 (*) 4.0 - 10.5 (K/uL)    RBC 2.73 (*) 3.87 - 5.11 (MIL/uL)    Hemoglobin 7.8 (*) 12.0 - 15.0 (g/dL)    HCT 21.3 (*) 08.6 - 46.0  (%)    MCV 94.9  78.0 - 100.0 (fL)    MCH 28.6  26.0 - 34.0 (pg)    MCHC 30.1  30.0 - 36.0 (g/dL)    RDW 57.8  46.9 - 62.9 (%)    Platelets 326  150 - 400 (K/uL)   BASIC METABOLIC PANEL     Status: Abnormal   Collection Time   06/12/11  4:46 AM      Component Value Range Comment   Sodium 138  135 - 145 (mEq/L)    Potassium 3.8  3.5 - 5.1 (mEq/L)    Chloride 100  96 - 112 (mEq/L)    CO2 30  19 - 32 (mEq/L)    Glucose, Bld 99  70 - 99 (mg/dL)    BUN 17  6 - 23 (mg/dL)    Creatinine, Ser 5.28 (*) 0.50 - 1.10 (mg/dL)    Calcium 8.4  8.4 - 10.5 (mg/dL)    GFR calc non Af Amer 10 (*) >90 (mL/min)    GFR calc Af Amer 12 (*) >90 (mL/min)   CBC     Status: Abnormal   Collection Time   06/12/11  4:46 AM      Component Value Range Comment   WBC 11.9 (*) 4.0 - 10.5 (K/uL)    RBC 2.92 (*) 3.87 - 5.11 (MIL/uL)    Hemoglobin 8.3 (*) 12.0 - 15.0 (g/dL)    HCT 41.3 (*) 24.4 - 46.0 (%)    MCV 94.9  78.0 - 100.0 (fL)    MCH 28.4  26.0 - 34.0 (pg)    MCHC 30.0  30.0 - 36.0 (g/dL)    RDW 01.0  27.2 - 53.6 (%)    Platelets 225  150 - 400 (K/uL) DELTA CHECK NOTED    Imaging: Dg Chest 2 View  06/12/2011  *RADIOLOGY REPORT*  Clinical Data: CHF, shortness of breath, pneumonia.  CHEST - 2 VIEW  Comparison: 06/10/2011  Findings: Cardiomegaly with vascular congestion.  Improving edema pattern.  Bilateral pleural effusions.  Bilateral lower lobe atelectasis or infiltrates.  IMPRESSION: Improving edema.  Continued bilateral effusions and bibasilar atelectasis or infiltrates.  Original Report Authenticated By: Cyndie Chime, [name]   Assessment:  1. Principal Problem: 2.  *CHF (congestive heart failure) 3. Active Problems: 4.  Anemia 5.  Pneumonia 6.  HTN (hypertension) 7.  ESRD (end stage renal disease) on dialysis 8. Mild aortic stenosis  Plan:  1. She is clinically  improving with dialysis and ultrafiltration. There persistent small bilateral pleural effusions on x-ray. Would recommend continued  volume optimization. 2. No changes in her medical regimen. 3. Followup with Dr. Allyson Sabal in our office after discharge.  Will sign off please call with questions.  Time Spent Directly with Patient:  15 minutes  Length of Stay:  LOS: 3 days   Chrystie Nose, MD Attending Cardiologist [facility]  HILTY,Kenneth C 06/12/2011, 12:30 PM

## 2011-06-12 NOTE — Discharge Summary (Signed)
Patient ID: [name] MRN: [id number] DOB/AGE: [birth year] 77 y.o.  Admit date: 06/09/2011 Discharge date: 06/12/2011  Primary Care Physician:  [name], MD  Discharge Diagnoses:    Present on Admission:  .CHF (congestive heart failure) .Pneumonia .HTN (hypertension) .ESRD (end stage renal disease) on dialysis .Anemia .Aortic stenosis, mild Acute Respiratory Failure, improving   Current Discharge Medication List    START taking these medications   Details  levofloxacin (LEVAQUIN) 500 MG tablet Take 1 tablet (500 mg total) by mouth every other day. Qty: 2 tablet, Refills: 0      CONTINUE these medications which have NOT CHANGED   Details  atorvastatin (LIPITOR) 40 MG tablet Take 40 mg by mouth daily.      benzonatate (TESSALON) 100 MG capsule Take 100 mg by mouth 3 (three) times daily as needed. For cough     calcitRIOL (ROCALTROL) 0.5 MCG capsule Take 0.5 mcg by mouth daily.     labetalol (NORMODYNE) 200 MG tablet Take 200 mg by mouth 2 (two) times daily.      NIFEdipine (PROCARDIA XL/ADALAT-CC) 90 MG 24 hr tablet Take 90 mg by mouth daily.     sodium bicarbonate 650 MG tablet Take 1,300 mg by mouth 3 (three) times daily.        STOP taking these medications     azithromycin (ZITHROMAX) 250 MG tablet         Disposition and Follow-up: Dr. Allyson Sabal after discharge, follow up with Primary doctor in 1-2 weeks and with Renal for continued dialysis tomorrow.  Consults:  [facility], Renal Dr. Kristian Covey, Pulmonary Dr. Juanetta Gosling  Significant Diagnostic Studies:  Dg Chest Port 1 View  06/09/2011  *RADIOLOGY REPORT*  Clinical Data: Shortness of breath and cough.  PORTABLE CHEST - 1 VIEW  Comparison: Chest radiograph performed 07/25/2010  Findings: The lungs are relatively well expanded.  Diffuse hazy left-sided airspace opacification is noted, and patchy right perihilar and right basilar airspace opacities are seen.  Small bilateral pleural  effusions are noted.  Findings raise concern for pulmonary edema, though superimposed pneumonia cannot be excluded. No pneumothorax is identified.  The cardiomediastinal silhouette is enlarged.  No acute osseous abnormalities are seen.  IMPRESSION: Diffuse hazy left-sided airspace opacification, and patchy right perihilar and right basilar airspace opacities, with small bilateral pleural effusions and underlying cardiomegaly.  Findings concerning for pulmonary edema, though superimposed pneumonia cannot be excluded.  Original Report Authenticated By: Tonia Ghent, [name]    Brief H and P: For complete details please refer to admission H and P, but in brief This very pleasant 77 year old lady was 12 hour history of sudden onset of dyspnea. She was feeling well yesterday and had dialysis. She tolerated the dialysis well and then in the evening around 10 PM she became dyspneic. She has had a cough in the last couple of days productive of white sputum. She denies any fever. When she came to the emergency room she was in respiratory failure and has required BiPAP. She remains alert. She has been on hemodialysis for the last couple of months 4 end-stage renal disease. She is hypertensive.   Physical Exam on Discharge:  Filed Vitals:   06/11/11 2025 06/11/11 2138 06/12/11 0518 06/12/11 0817  BP:  122/60 113/62   Pulse:  77 69 71  Temp:  98.6 F (37 C) 98.2 F (36.8 C)   TempSrc:  Oral Oral   Resp:  20 16   Height:      Weight:  SpO2: 97% 99% 97% 98%     Intake/Output Summary (Last 24 hours) at 06/12/11 1450 Last data filed at 06/12/11 0700  Gross per 24 hour  Intake    120 ml  Output      0 ml  Net    120 ml    General: Alert, awake, oriented x3, in no acute distress. HEENT: No bruits, no goiter. Heart: Regular rate and rhythm, without murmurs, rubs, gallops. Lungs: decreased breath sounds at bases Abdomen: Soft, nontender, nondistended, positive bowel sounds. Extremities: No  clubbing cyanosis or edema with positive pedal pulses. Neuro: Grossly intact, nonfocal.  CBC:    Component Value Date/Time   WBC 11.9* 06/12/2011 0446   HGB 8.3* 06/12/2011 0446   HCT 27.7* 06/12/2011 0446   PLT 225 06/12/2011 0446   MCV 94.9 06/12/2011 0446   NEUTROABS 11.7* 06/09/2011 0501   LYMPHSABS 1.7 06/09/2011 0501   MONOABS 0.7 06/09/2011 0501   EOSABS 0.3 06/09/2011 0501   BASOSABS 0.1 06/09/2011 0501    Basic Metabolic Panel:    Component Value Date/Time   NA 138 06/12/2011 0446   K 3.8 06/12/2011 0446   CL 100 06/12/2011 0446   CO2 30 06/12/2011 0446   BUN 17 06/12/2011 0446   CREATININE 3.81* 06/12/2011 0446   GLUCOSE 99 06/12/2011 0446   CALCIUM 8.4 06/12/2011 0446   CALCIUM 8.3* 02/26/2011 1011    Hospital Course:  Principal Problem:  *CHF (congestive heart failure), Patient was initially admitted to the intensive care unit with acute respiratory failure felt to be due to volume overload as well as a component of pneumonia.  She was started on IV antibiotics and lasix. She was continued on hemodialysis with ultrafiltration.  She initially required a Bipap, but this was quickly weaned down to a nasal cannula.  She was transferred to the floor and currently is on room air.  She was ambulated in the halls and her oxygen saturations did not drop to a level where she would qualify for home oxygen.  She was seen in consultation by the [facility] heart and vascular group with plans for outpatient follow up. Active Problems:  Anemia, likely due to chronic renal disease, on epogen.  Pneumonia, was on rocephin and azithro, currently afebrile, will change to oral levaquin to complete course.  HTN (hypertension)  ESRD (end stage renal disease) on dialysis, Tuesday, Thursday and saturday  Aortic stenosis, mild   Time spent on Discharge:  Signed: MEMON,JEHANZEB 06/12/2011, 2:50 PM

## 2011-06-12 NOTE — Progress Notes (Signed)
[name]  MRN: [id number]  DOB/AGE: March 14, 1928 77 y.o.  Primary Care Physician:HILL,GERALD K, MD  Admit date: 06/09/2011  Chief Complaint:  Chief Complaint  Patient presents with  . Shortness of Breath    S-Pt presented on  06/09/2011 with  Chief Complaint  Patient presents with  . Shortness of Breath  .    Pt today feels better.pt is happy that she is going home,.  Meds     . antiseptic oral rinse  15 mL Mouth Rinse QID  . azithromycin  500 mg Oral Daily  . calcitRIOL  0.5 mcg Oral Daily  . cefTRIAXone (ROCEPHIN)  IV  1 g Intravenous Q24H  . epoetin alfa  14,000 Units Intravenous Custom  . heparin  5,000 Units Subcutaneous Q8H  . heparin  100 Units/kg Dialysis Once in dialysis  . labetalol  200 mg Oral BID  . NIFEdipine  90 mg Oral Daily  . rosuvastatin  10 mg Oral Daily  . DISCONTD: azithromycin  500 mg Intravenous Q24H         WNU:UVOZD from the symptoms mentioned above,there are no other symptoms referable to all systems reviewed.  Physical Exam: Vital signs in last 24 hours: Temp:  [98.2 F (36.8 C)-98.6 F (37 C)] 98.2 F (36.8 C) (12/05 0518) Pulse Rate:  [69-77] 71  (12/05 0817) Resp:  [16-20] 16  (12/05 0518) BP: (113-122)/(60-62) 113/62 mmHg (12/05 0518) SpO2:  [97 %-99 %] 98 % (12/05 0817) Weight change: 3 lb 12 oz (1.7 kg) Last BM Date: 06/09/11  Intake/Output from previous day: 12/04 0701 - 12/05 0700 In: 365 [P.O.:245; I.V.:120] Out: 3000      Physical Exam: General appearance: alert, cooperative and no distress  Resp: clear to auscultation bilaterally  Cardio:S1S2 rrr, systolic murmur: holosystolic  GI: soft, non-tender; bowel sounds normal; no masses, no organomegaly  Extremities: extremities normal, atraumatic, no cyanosis or edema Access- left AVF + thrill + Bruit +  Lab Results: CBC  Basename 06/12/11 0446 06/11/11 0501  WBC 11.9* 11.5*  HGB 8.3* 7.8*  HCT 27.7* 25.9*  PLT 225 326    BMET  Basename 06/12/11  0446 06/11/11 0501  NA 138 139  K 3.8 3.4*  CL 100 99  CO2 30 31  GLUCOSE 99 96  BUN 17 24*  CREATININE 3.81* 4.72*  CALCIUM 8.4 7.9*    MICRO Recent Results (from the past 240 hour(s))  MRSA PCR SCREENING     Status: Normal   Collection Time   06/09/11  8:50 AM      Component Value Range Status Comment   MRSA by PCR NEGATIVE  NEGATIVE  Final   CULTURE, BLOOD (ROUTINE X 2)     Status: Normal (Preliminary result)   Collection Time   06/09/11 11:25 AM      Component Value Range Status Comment   Specimen Description BLOOD LEFT HAND   Final    Special Requests     Final    Value: BOTTLES DRAWN AEROBIC AND ANAEROBIC 6CC EACH BOTTLE   Culture NO GROWTH 3 DAYS   Final    Report Status PENDING   Incomplete   CULTURE, BLOOD (ROUTINE X 2)     Status: Normal (Preliminary result)   Collection Time   06/09/11 11:35 AM      Component Value Range Status Comment   Specimen Description BLOOD LEFT HAND   Final    Special Requests     Final    Value: BOTTLES DRAWN  AEROBIC AND ANAEROBIC 8CC EACH BOTTLE   Culture NO GROWTH 3 DAYS   Final    Report Status PENDING   Incomplete       Lab Results  Component Value Date   PTH 399.0* 02/26/2011   CALCIUM 8.4 06/12/2011   CAION 1.10* 06/09/2011   PHOS 2.8 06/10/2011             Impression: 1)Renal ESRD               ON HD-TTS Schedule               Pt was last dialyzed yesterday               NO need of HD today               Left AVF +             2)HTN  BP at goal Medication- On Calcium Channel Blockers  On Alpha and beta Blockers   3)Anemia HGb not  at goal (9--11)- but improving                On EPO during HD  4)CKD Mineral-Bone Disorder PTHacceptable Secondary Hyperparathyroidism present Phosphorus at goal-rather on lower side Calcium at goal- Corrected ca (7.9+1.2)=9.1   5)Resp- ADmitted w Pneumonia                On Antibiotiics                  6)FEN  Normokalemic NOrmonatremic Albumin -Low   7)Acid  base Co2 at goal  8) CHF- Admitted w Volume Overload- Better after HD   Plan:  Agree with current treatment and plan. Will be dialyzed as oupt.      Krishav Mamone S 06/12/2011, 4:33 PM

## 2011-06-13 NOTE — Progress Notes (Signed)
Patient was given discharge instructions along with follow up appointments and prescriptions. Patient verbalized understanding of all instructions. Patient was escorted by staff via wheelchair to vehicle. Patient discharged to home in stable condition.

## 2011-06-14 LAB — CULTURE, BLOOD (ROUTINE X 2)
Culture: NO GROWTH
Culture: NO GROWTH

## 2011-06-18 ENCOUNTER — Encounter (HOSPITAL_COMMUNITY): Payer: Self-pay | Admitting: Emergency Medicine

## 2011-06-18 ENCOUNTER — Inpatient Hospital Stay (HOSPITAL_COMMUNITY): Payer: PRIVATE HEALTH INSURANCE

## 2011-06-18 ENCOUNTER — Emergency Department (HOSPITAL_COMMUNITY): Payer: PRIVATE HEALTH INSURANCE

## 2011-06-18 ENCOUNTER — Other Ambulatory Visit: Payer: Self-pay

## 2011-06-18 ENCOUNTER — Inpatient Hospital Stay (HOSPITAL_COMMUNITY)
Admission: EM | Admit: 2011-06-18 | Discharge: 2011-06-19 | DRG: 308 | Disposition: A | Discharge status: Home / Self Care | Payer: PRIVATE HEALTH INSURANCE | Attending: Internal Medicine | Admitting: Internal Medicine

## 2011-06-18 DIAGNOSIS — D649 Anemia, unspecified: Secondary | ICD-10-CM

## 2011-06-18 DIAGNOSIS — I10 Essential (primary) hypertension: Secondary | ICD-10-CM

## 2011-06-18 DIAGNOSIS — J189 Pneumonia, unspecified organism: Secondary | ICD-10-CM | POA: Diagnosis present

## 2011-06-18 DIAGNOSIS — I350 Nonrheumatic aortic (valve) stenosis: Secondary | ICD-10-CM

## 2011-06-18 DIAGNOSIS — D631 Anemia in chronic kidney disease: Secondary | ICD-10-CM | POA: Diagnosis present

## 2011-06-18 DIAGNOSIS — N039 Chronic nephritic syndrome with unspecified morphologic changes: Secondary | ICD-10-CM | POA: Diagnosis present

## 2011-06-18 DIAGNOSIS — I4891 Unspecified atrial fibrillation: Principal | ICD-10-CM | POA: Diagnosis present

## 2011-06-18 DIAGNOSIS — N2581 Secondary hyperparathyroidism of renal origin: Secondary | ICD-10-CM | POA: Diagnosis present

## 2011-06-18 DIAGNOSIS — I120 Hypertensive chronic kidney disease with stage 5 chronic kidney disease or end stage renal disease: Secondary | ICD-10-CM | POA: Diagnosis present

## 2011-06-18 DIAGNOSIS — Z992 Dependence on renal dialysis: Secondary | ICD-10-CM | POA: Diagnosis present

## 2011-06-18 DIAGNOSIS — N186 End stage renal disease: Secondary | ICD-10-CM | POA: Diagnosis present

## 2011-06-18 DIAGNOSIS — I509 Heart failure, unspecified: Secondary | ICD-10-CM

## 2011-06-18 DIAGNOSIS — I359 Nonrheumatic aortic valve disorder, unspecified: Secondary | ICD-10-CM | POA: Diagnosis present

## 2011-06-18 LAB — TSH: TSH: 2.713 u[IU]/mL (ref 0.350–4.500)

## 2011-06-18 LAB — DIFFERENTIAL
Basophils Absolute: 0.1 10*3/uL (ref 0.0–0.1)
Basophils Relative: 1 % (ref 0–1)
Eosinophils Absolute: 0.3 10*3/uL (ref 0.0–0.7)
Eosinophils Relative: 4 % (ref 0–5)
Monocytes Absolute: 0.9 10*3/uL (ref 0.1–1.0)
Monocytes Relative: 10 % (ref 3–12)

## 2011-06-18 LAB — BASIC METABOLIC PANEL
BUN: 25 mg/dL — ABNORMAL HIGH (ref 6–23)
Calcium: 9.8 mg/dL (ref 8.4–10.5)
Chloride: 93 mEq/L — ABNORMAL LOW (ref 96–112)
Creatinine, Ser: 6.29 mg/dL — ABNORMAL HIGH (ref 0.50–1.10)
GFR calc Af Amer: 6 mL/min — ABNORMAL LOW (ref >90)
GFR calc non Af Amer: 6 mL/min — ABNORMAL LOW (ref >90)

## 2011-06-18 LAB — PRO B NATRIURETIC PEPTIDE: Pro B Natriuretic peptide (BNP): 25761 pg/mL — ABNORMAL HIGH (ref 0–450)

## 2011-06-18 LAB — APTT: aPTT: 31 seconds (ref 24–37)

## 2011-06-18 LAB — CBC
HCT: 29.1 % — ABNORMAL LOW (ref 36.0–46.0)
Hemoglobin: 9.2 g/dL — ABNORMAL LOW (ref 12.0–15.0)
MCH: 30 pg (ref 26.0–34.0)
MCHC: 31.6 g/dL (ref 30.0–36.0)
MCV: 94.8 fL (ref 78.0–100.0)
RDW: 16.1 % — ABNORMAL HIGH (ref 11.5–15.5)

## 2011-06-18 LAB — CARDIAC PANEL(CRET KIN+CKTOT+MB+TROPI)
Relative Index: INVALID (ref 0.0–2.5)
Troponin I: 0.44 ng/mL (ref <0.30)
Troponin I: 0.41 ng/mL (ref <0.30)

## 2011-06-18 LAB — MRSA PCR SCREENING: MRSA by PCR: NEGATIVE

## 2011-06-18 MED ORDER — SODIUM CHLORIDE 0.9 % IV BOLUS (SEPSIS)
500.0000 mL | Freq: Once | INTRAVENOUS | Status: DC
  Administered 2011-06-18: 500 mL via INTRAVENOUS

## 2011-06-18 MED ORDER — DILTIAZEM HCL 50 MG/10ML IV SOLN
10.0000 mg | Freq: Once | INTRAVENOUS | Status: AC
  Administered 2011-06-18: 10 mg via INTRAVENOUS
  Filled 2011-06-18: qty 10

## 2011-06-18 MED ORDER — LEVOFLOXACIN 250 MG PO TABS
500.0000 mg | ORAL_TABLET | ORAL | Status: DC
  Administered 2011-06-18: 500 mg via ORAL
  Filled 2011-06-18: qty 2

## 2011-06-18 MED ORDER — SODIUM CHLORIDE 0.9 % IJ SOLN: 3.0000 mL | INTRAMUSCULAR | Status: DC | PRN

## 2011-06-18 MED ORDER — CALCITRIOL 0.25 MCG PO CAPS
0.5000 ug | ORAL_CAPSULE | Freq: Every day | ORAL | Status: DC
  Administered 2011-06-18: 0.5 ug via ORAL
  Filled 2011-06-18 (×2): qty 1

## 2011-06-18 MED ORDER — LABETALOL HCL 200 MG PO TABS
200.0000 mg | ORAL_TABLET | Freq: Two times a day (BID) | ORAL | Status: DC
  Administered 2011-06-18 (×2): 200 mg via ORAL
  Filled 2011-06-18 (×2): qty 1

## 2011-06-18 MED ORDER — ASPIRIN 81 MG PO CHEW
324.0000 mg | CHEWABLE_TABLET | Freq: Once | ORAL | Status: AC
  Administered 2011-06-18: 324 mg via ORAL
  Filled 2011-06-18: qty 4

## 2011-06-18 MED ORDER — SODIUM CHLORIDE 0.9 % IV SOLN: 250.0000 mL | INTRAVENOUS | Status: DC | PRN

## 2011-06-18 MED ORDER — DILTIAZEM HCL 100 MG IV SOLR
5.0000 mg/h | INTRAVENOUS | Status: DC
  Administered 2011-06-18: 5 mg/h via INTRAVENOUS
  Filled 2011-06-18: qty 100

## 2011-06-18 MED ORDER — ROSUVASTATIN CALCIUM 20 MG PO TABS
20.0000 mg | ORAL_TABLET | Freq: Every day | ORAL | Status: DC
  Administered 2011-06-18: 20 mg via ORAL
  Filled 2011-06-18 (×2): qty 1

## 2011-06-18 MED ORDER — EPOETIN ALFA 10000 UNIT/ML IJ SOLN
14000.0000 [IU] | INTRAMUSCULAR | Status: DC
  Administered 2011-06-18: 14000 [IU] via INTRAVENOUS
  Filled 2011-06-18: qty 2

## 2011-06-18 MED ORDER — SODIUM BICARBONATE 650 MG PO TABS
1300.0000 mg | ORAL_TABLET | Freq: Three times a day (TID) | ORAL | Status: DC
  Administered 2011-06-18 (×3): 1300 mg via ORAL
  Filled 2011-06-18 (×3): qty 2

## 2011-06-18 MED ORDER — DILTIAZEM HCL 100 MG IV SOLR
5.0000 mg/h | INTRAVENOUS | Status: DC
  Administered 2011-06-18: 10 mg/h via INTRAVENOUS
  Filled 2011-06-18: qty 100

## 2011-06-18 MED ORDER — DILTIAZEM HCL 30 MG PO TABS
30.0000 mg | ORAL_TABLET | Freq: Four times a day (QID) | ORAL | Status: DC
  Administered 2011-06-18 – 2011-06-19 (×4): 30 mg via ORAL
  Filled 2011-06-18 (×4): qty 1

## 2011-06-18 MED ORDER — SODIUM CHLORIDE 0.9 % IJ SOLN
3.0000 mL | Freq: Two times a day (BID) | INTRAMUSCULAR | Status: DC
  Administered 2011-06-18 (×2): 3 mL via INTRAVENOUS
  Filled 2011-06-18 (×2): qty 3

## 2011-06-18 MED ORDER — POTASSIUM CHLORIDE CRYS ER 20 MEQ PO TBCR
20.0000 meq | EXTENDED_RELEASE_TABLET | Freq: Once | ORAL | Status: AC
  Administered 2011-06-18: 20 meq via ORAL
  Filled 2011-06-18: qty 1

## 2011-06-18 NOTE — Progress Notes (Signed)
Subjective: This lady was admitted yesterday with atrial fibrillation rapid ventricular response. She described feeling irregular palpitations suddenly. She denies any significant increasing dyspnea and she does not have any chest pain. She has been started on Cardizem drip and she is back in sinus rhythm now. She has end-stage renal disease and is due to receive dialysis today.           Physical Exam: Blood pressure 148/68, pulse 81, temperature 97.9 F (36.6 C), temperature source Oral, resp. rate 20, height 5\' 6"  (1.676 m), weight 57.8 kg (127 lb 6.8 oz), SpO2 97.00%. She does act he looks systemically well. There is no increased work of breathing. Lung fields show bronchial breathing in the right mid and lower zones. Otherwise lung fields are relatively clear. Heart sounds are in sinus rhythm, there is a systolic murmur which has been heard before. She is alert and orientated.   Investigations:  Recent Results (from the past 240 hour(s))  MRSA PCR SCREENING     Status: Normal   Collection Time   06/09/11  8:50 AM      Component Value Range Status Comment   MRSA by PCR NEGATIVE  NEGATIVE  Final   CULTURE, BLOOD (ROUTINE X 2)     Status: Normal   Collection Time   06/09/11 11:25 AM      Component Value Range Status Comment   Specimen Description BLOOD LEFT HAND   Final    Special Requests     Final    Value: BOTTLES DRAWN AEROBIC AND ANAEROBIC 6CC EACH BOTTLE   Culture NO GROWTH 5 DAYS   Final    Report Status 06/14/2011 FINAL   Final   CULTURE, BLOOD (ROUTINE X 2)     Status: Normal   Collection Time   06/09/11 11:35 AM      Component Value Range Status Comment   Specimen Description BLOOD LEFT HAND   Final    Special Requests     Final    Value: BOTTLES DRAWN AEROBIC AND ANAEROBIC 8CC EACH BOTTLE   Culture NO GROWTH 5 DAYS   Final    Report Status 06/14/2011 FINAL   Final   MRSA PCR SCREENING     Status: Normal   Collection Time   06/18/11  5:19 AM      Component  Value Range Status Comment   MRSA by PCR NEGATIVE  NEGATIVE  Final      Basic Metabolic Panel:  Basename 06/18/11 0214  NA 138  K 3.2*  CL 93*  CO2 29  GLUCOSE 97  BUN 25*  CREATININE 6.29*  CALCIUM 9.8  MG --  PHOS --       CBC:  Basename 06/18/11 0214  WBC 9.2  NEUTROABS 6.1  HGB 9.2*  HCT 29.1*  MCV 94.8  PLT 377    Dg Chest 2 View  06/18/2011  *RADIOLOGY REPORT*  Clinical Data: Shortness of breath.  Rapid heart rate.  History of CHF.  CHEST - 2 VIEW  Comparison: 06/12/2011  Findings: Cardiac enlargement with some increased pulmonary vascularity and interstitial edema versus fibrosis.  Improving infiltrates and effusions since previous study.  Small residual pleural effusions.  IMPRESSION: Congestive changes in the heart lungs with interstitial edema and small bilateral pleural effusions.  Improving infiltrates and effusions since previous study.  Original Report Authenticated By: Marlon Pel, [name]      Medications: I have reviewed the patient's current medications.  Impression: 1. Atrial fibrillation with rapid  ventricular response, now in sinus rhythm. 2. Recent right-sided pneumonia, clinically and radiologically improving. 3. End stage renal disease on hemodialysis, due for hemodialysis today. 4. Aortic stenosis, mild per echocardiogram on 06/10/2011.     Plan: 1. Wean off Cardizem drip and start oral Cardizem every 6 hours. 2. Serial cardiac enzymes have been ordered and we will await these results. 3. Hemodialysis today.     LOS: 0 days   Amnah Breuer C 06/18/2011, 8:57 AM

## 2011-06-18 NOTE — ED Provider Notes (Signed)
History     CSN: [id number] Arrival date & time: 06/18/2011  1:49 AM   First MD Initiated Contact with Patient 06/18/11 0157      Chief Complaint  Patient presents with  . Shortness of Breath    (Consider location/radiation/quality/duration/timing/severity/associated sxs/prior treatment) HPI Comments: Recently dc from hospital 1 weeks ago for HCAP and CHF exacerbation. Developed shortness of breath earlier this evening worse when the supine position. She does endorse orthopnea no PND. Also describes palpitations with associated substernal chest discomfort. She states this is a new sx which she has not experienced. Due for dialysis today  Patient is a 77 y.o. female presenting with shortness of breath. The history is provided by the patient. No language interpreter was used.  Shortness of Breath  The current episode started today. The onset was gradual. The problem occurs continuously. The problem has been gradually worsening. The problem is moderate. Relieved by: sitting up. The symptoms are aggravated by a supine position. Associated symptoms include chest pain, orthopnea and shortness of breath. Pertinent negatives include no fever, no rhinorrhea, no sore throat and no cough. She has had prior hospitalizations (1 week ago). Recently, medical care has been given at this facility.    Past Medical History  Diagnosis Date  . Anemia 03/06/2011  . Hypertension   . Coronary artery disease   . Renal disorder   . Aortic stenosis, mild     History reviewed. No pertinent past surgical history.  No family history on file.  History  Substance Use Topics  . Smoking status: Never Smoker   . Smokeless tobacco: Not on file  . Alcohol Use: No    OB History    Grav Para Term Preterm Abortions TAB SAB Ect Mult Living                  Review of Systems  Constitutional: Negative for fever, activity change, appetite change and fatigue.  HENT: Negative for congestion, sore throat,  rhinorrhea, neck pain and neck stiffness.   Respiratory: Positive for shortness of breath. Negative for cough.   Cardiovascular: Positive for chest pain, palpitations and orthopnea.  Gastrointestinal: Negative for nausea, vomiting and abdominal pain.  Genitourinary: Negative for dysuria, urgency, frequency and flank pain.  Neurological: Negative for dizziness, weakness, light-headedness, numbness and headaches.  All other systems reviewed and are negative.    Allergies  Review of patient's allergies indicates no known allergies.  Home Medications   Current Outpatient Rx  Name Route Sig Dispense Refill  . ATORVASTATIN CALCIUM 40 MG PO TABS Oral Take 40 mg by mouth daily.      Marland Kitchen BENZONATATE 100 MG PO CAPS Oral Take 100 mg by mouth 3 (three) times daily as needed. For cough     . CALCITRIOL 0.5 MCG PO CAPS Oral Take 0.5 mcg by mouth daily.     Marland Kitchen LABETALOL HCL 200 MG PO TABS Oral Take 200 mg by mouth 2 (two) times daily.      Marland Kitchen LEVOFLOXACIN 500 MG PO TABS Oral Take 1 tablet (500 mg total) by mouth every other day. 2 tablet 0  . NIFEDIPINE ER OSMOTIC 90 MG PO TB24 Oral Take 90 mg by mouth daily.     . SODIUM BICARBONATE 650 MG PO TABS Oral Take 1,300 mg by mouth 3 (three) times daily.        BP 174/63  Pulse 114  Temp(Src) 97.7 F (36.5 C) (Oral)  Resp 22  Ht 5\' 5"  (1.651 m)  Wt 130 lb (58.968 kg)  BMI 21.63 kg/m2  SpO2 93%  Physical Exam  Nursing note and vitals reviewed. Constitutional: She is oriented to person, place, and time. She appears well-developed and well-nourished. No distress.  HENT:  Head: Normocephalic and atraumatic.  Mouth/Throat: Oropharynx is clear and moist.  Eyes: Conjunctivae and EOM are normal. Pupils are equal, round, and reactive to light.  Neck: Normal range of motion. Neck supple.  Cardiovascular: Intact distal pulses.   Murmur heard.      Tachycardic rate and irreg rhythm  Pulmonary/Chest: Effort normal. No respiratory distress. She has rales.  She exhibits no tenderness.  Abdominal: Soft. Bowel sounds are normal. There is no tenderness. There is no rebound and no guarding.  Musculoskeletal: Normal range of motion. She exhibits no tenderness.  Neurological: She is alert and oriented to person, place, and time. No cranial nerve deficit.  Skin: Skin is warm and dry. No rash noted.    ED Course  Procedures (including critical care time)  CRITICAL CARE Performed by: Dayton Bailiff   Total critical care time: 30 min  Critical care time was exclusive of separately billable procedures and treating other patients.  Critical care was necessary to treat or prevent imminent or life-threatening deterioration.  Critical care was time spent personally by me on the following activities: development of treatment plan with patient and/or surrogate as well as nursing, discussions with consultants, evaluation of patient's response to treatment, examination of patient, obtaining history from patient or surrogate, ordering and performing treatments and interventions, ordering and review of laboratory studies, ordering and review of radiographic studies, pulse oximetry and re-evaluation of patient's condition.    Date: 06/18/2011  Rate: 150  Rhythm: atrial fibrillation  QRS Axis: normal  Intervals: normal  ST/T Wave abnormalities: indeterminate  Conduction Disutrbances:none  Narrative Interpretation:   Old EKG Reviewed: changes noted  Labs Reviewed  CBC - Abnormal; Notable for the following:    RBC 3.07 (*)    Hemoglobin 9.2 (*)    HCT 29.1 (*)    RDW 16.1 (*)    All other components within normal limits  BASIC METABOLIC PANEL - Abnormal; Notable for the following:    Potassium 3.2 (*)    Chloride 93 (*)    BUN 25 (*)    Creatinine, Ser 6.29 (*)    GFR calc non Af Amer 6 (*)    GFR calc Af Amer 6 (*)    All other components within normal limits  PRO B NATRIURETIC PEPTIDE - Abnormal; Notable for the following:    Pro B Natriuretic  peptide (BNP) 25761.0 (*)    All other components within normal limits  DIFFERENTIAL  PROTIME-INR  APTT  POCT I-STAT TROPONIN I  I-STAT TROPONIN I   Dg Chest 2 View  06/18/2011  *RADIOLOGY REPORT*  Clinical Data: Shortness of breath.  Rapid heart rate.  History of CHF.  CHEST - 2 VIEW  Comparison: 06/12/2011  Findings: Cardiac enlargement with some increased pulmonary vascularity and interstitial edema versus fibrosis.  Improving infiltrates and effusions since previous study.  Small residual pleural effusions.  IMPRESSION: Congestive changes in the heart lungs with interstitial edema and small bilateral pleural effusions.  Improving infiltrates and effusions since previous study.  Original Report Authenticated By: Marlon Pel, [name]     1. Atrial fibrillation with RVR   2. CHF (congestive heart failure)       MDM  Procedure fibrillation with rapid ventricular response. I feel this is  likely contributing to the cause for shortness of breath. She is placed on a diltiazem drip after receiving a bolus. She'll require admission for further evaluation and management of this condition. Laboratory studies were performed relatively unremarkable except for elevated pro BNP in creatinine. She has some congestion on her chest x-ray consistent with CHF however she does receive dialysis today. I spoke with the [facility] hospitalist who accepted the patient for admission she'll be admitted to step down unit        Dayton Bailiff, MD 06/18/11 [phone]

## 2011-06-18 NOTE — ED Notes (Signed)
Dr. Onalee Hua at bedside to assess.

## 2011-06-18 NOTE — ED Notes (Signed)
Patient presents to ER with c/o shortness of breath that started earlier this evening.  Patient states was in hospital last week for CHF.

## 2011-06-18 NOTE — Consult Note (Signed)
Reason for Consult: End-stage renal disease Referring Physician: Kortnie Beard is an 77 y.o. female.  HPI: Patient with end-stage renal disease status post hemodialysis on Saturday presently came with palpitations. Patient denies any chest pain she denies any  nausea or vomiting. She denies also any difficulty breathing.  Past Medical History  Diagnosis Date  . Anemia 03/06/2011  . Hypertension   . Coronary artery disease   . Renal disorder   . Aortic stenosis, mild     History reviewed. No pertinent past surgical history.  History reviewed. No pertinent family history.  Social History:  reports that she has never smoked. She does not have any smokeless tobacco history on file. She reports that she does not drink alcohol or use illicit drugs.  Allergies: No Known Allergies  Medications: I have reviewed the patient's current medications.  Results for orders placed during the hospital encounter of 06/18/11 (from the past 48 hour(s))  CBC     Status: Abnormal   Collection Time   06/18/11  2:14 AM      Component Value Range Comment   WBC 9.2  4.0 - 10.5 (K/uL)    RBC 3.07 (*) 3.87 - 5.11 (MIL/uL)    Hemoglobin 9.2 (*) 12.0 - 15.0 (g/dL)    HCT 16.1 (*) 09.6 - 46.0 (%)    MCV 94.8  78.0 - 100.0 (fL)    MCH 30.0  26.0 - 34.0 (pg)    MCHC 31.6  30.0 - 36.0 (g/dL)    RDW 04.5 (*) 40.9 - 15.5 (%)    Platelets 377  150 - 400 (K/uL)   DIFFERENTIAL     Status: Normal   Collection Time   06/18/11  2:14 AM      Component Value Range Comment   Neutrophils Relative 66  43 - 77 (%)    Neutro Abs 6.1  1.7 - 7.7 (K/uL)    Lymphocytes Relative 21  12 - 46 (%)    Lymphs Abs 1.9  0.7 - 4.0 (K/uL)    Monocytes Relative 10  3 - 12 (%)    Monocytes Absolute 0.9  0.1 - 1.0 (K/uL)    Eosinophils Relative 4  0 - 5 (%)    Eosinophils Absolute 0.3  0.0 - 0.7 (K/uL)    Basophils Relative 1  0 - 1 (%)    Basophils Absolute 0.1  0.0 - 0.1 (K/uL)   BASIC METABOLIC PANEL     Status:  Abnormal   Collection Time   06/18/11  2:14 AM      Component Value Range Comment   Sodium 138  135 - 145 (mEq/L)    Potassium 3.2 (*) 3.5 - 5.1 (mEq/L)    Chloride 93 (*) 96 - 112 (mEq/L)    CO2 29  19 - 32 (mEq/L)    Glucose, Bld 97  70 - 99 (mg/dL)    BUN 25 (*) 6 - 23 (mg/dL)    Creatinine, Ser 8.11 (*) 0.50 - 1.10 (mg/dL)    Calcium 9.8  8.4 - 10.5 (mg/dL)    GFR calc non Af Amer 6 (*) >90 (mL/min)    GFR calc Af Amer 6 (*) >90 (mL/min)   PRO B NATRIURETIC PEPTIDE     Status: Abnormal   Collection Time   06/18/11  2:14 AM      Component Value Range Comment   Pro B Natriuretic peptide (BNP) 25761.0 (*) 0 - 450 (pg/mL)   PROTIME-INR  Status: Normal   Collection Time   06/18/11  2:14 AM      Component Value Range Comment   Prothrombin Time 14.1  11.6 - 15.2 (seconds)    INR 1.07  0.00 - 1.49    APTT     Status: Normal   Collection Time   06/18/11  2:14 AM      Component Value Range Comment   aPTT 31  24 - 37 (seconds)   POCT I-STAT TROPONIN I     Status: Normal   Collection Time   06/18/11  2:38 AM      Component Value Range Comment   Troponin i, poc 0.03  0.00 - 0.08 (ng/mL)    Comment 3              Dg Chest 2 View  06/18/2011  *RADIOLOGY REPORT*  Clinical Data: Shortness of breath.  Rapid heart rate.  History of CHF.  CHEST - 2 VIEW  Comparison: 06/12/2011  Findings: Cardiac enlargement with some increased pulmonary vascularity and interstitial edema versus fibrosis.  Improving infiltrates and effusions since previous study.  Small residual pleural effusions.  IMPRESSION: Congestive changes in the heart lungs with interstitial edema and small bilateral pleural effusions.  Improving infiltrates and effusions since previous study.  Original Report Authenticated By: Marlon Pel, [name]    Review of Systems  Respiratory: Negative for cough and shortness of breath.   Cardiovascular: Positive for palpitations. Negative for chest pain, orthopnea, leg swelling and  PND.  Gastrointestinal: Negative for nausea and vomiting.   Blood pressure 148/68, pulse 81, temperature 97.9 F (36.6 C), temperature source Oral, resp. rate 20, height 5\' 6"  (1.676 m), weight 57.8 kg (127 lb 6.8 oz), SpO2 97.00%. Physical Exam  Constitutional: She is oriented to person, place, and time.  HENT:  Head: Normocephalic.  Eyes: No scleral icterus.  Cardiovascular: Normal rate and regular rhythm.  Exam reveals no gallop and no friction rub.   Murmur heard.      She has a 3/6 systolic ejection murmur more on the left side and the we'll some radiation to her neck.  Respiratory: No respiratory distress. She has no rales.  GI: There is no rebound.  Musculoskeletal: She exhibits no edema and no tenderness.  Neurological: She is alert and oriented to person, place, and time.    Assessment/Plan: Problem #1 end-stage renal disease she status post hemodialysis on the Saturday her BUN and creatinine is  in acceptable range potassium is slightly low. Patient is due for dialysis today. Problem #2 a trial fibrillation her heart is seems to be controlled and at this moment seems to be in sinus rhythm. Problem #3 history of hypertension her blood pressure seems to control very well Problem #4 history aortic stenosis Problem #5 history of anemia Problem #6 history of her recent pneumonia Problem #7 history of hypercholesterolemia Problem #8 history of anemia this is unable of chronic disease patient on Epogen H&H is stable. Recommendation I will make arrangements for patient to get dialysis today We'll use 4K. and 2.5 calcium bath and we'll try to get about 2 L if systolic blood pressure tolerates.  [name] S 06/18/2011, 8:10 AM

## 2011-06-18 NOTE — H&P (Signed)
PCP:   Brittany Courier, MD   Chief Complaint:  palpitations  HPI: 77 year old female with end-stage renal disease on dialysis for the last 2 months who was recently discharged from hospital last week diagnosed with pneumonia he was sent home on Levaquin has been feeling well over the last week period and then over the last 24 hours has started feeling palpitations. She denies any chest pain denies any shortness of breath denies any lower she'll be edema denies any abdominal pain. When she presented to the ED she was in A. fib with RVR which is new for her. She denies running any fevers at home no nausea no vomiting or diarrhea. She is due for dialysis today.  Review of Systems:  Otherwise negative  Past Medical History: Past Medical History  Diagnosis Date  . Anemia 03/06/2011  . Hypertension   . Coronary artery disease   . Renal disorder   . Aortic stenosis, mild    History reviewed. No pertinent past surgical history.  Medications: Prior to Admission medications   Medication Sig Start Date End Date Taking? Authorizing Provider  atorvastatin (LIPITOR) 40 MG tablet Take 40 mg by mouth daily.     Yes Historical Provider, MD  benzonatate (TESSALON) 100 MG capsule Take 100 mg by mouth 3 (three) times daily as needed. For cough    Yes Historical Provider, MD  calcitRIOL (ROCALTROL) 0.5 MCG capsule Take 0.5 mcg by mouth daily.    Yes Historical Provider, MD  labetalol (NORMODYNE) 200 MG tablet Take 200 mg by mouth 2 (two) times daily.     Yes Historical Provider, MD  levofloxacin (LEVAQUIN) 500 MG tablet Take 1 tablet (500 mg total) by mouth every other day. 06/12/11 06/22/11 Yes Jehanzeb Memon  NIFEdipine (PROCARDIA XL/ADALAT-CC) 90 MG 24 hr tablet Take 90 mg by mouth daily.    Yes Historical Provider, MD  sodium bicarbonate 650 MG tablet Take 1,300 mg by mouth 3 (three) times daily.     Yes Historical Provider, MD    Allergies:  No Known Allergies  Social History:  reports that she  has never smoked. She does not have any smokeless tobacco history on file. She reports that she does not drink alcohol or use illicit drugs.  Family History: History reviewed. No pertinent family history.  Physical Exam: Filed Vitals:   06/18/11 0141 06/18/11 0200 06/18/11 0400 06/18/11 0500  BP: 174/63 178/71 152/80 159/71  Pulse: 114 110 42 93  Temp: 97.7 F (36.5 C)     TempSrc: Oral     Resp: 22  21 24   Height: 5\' 5"  (1.651 m)     Weight: 58.968 kg (130 lb)     SpO2: 93% 96% 94% 97%   General appearance: alert, cooperative, appears stated age and no distress Resp: clear to auscultation bilaterally Cardio: irregularly irregular rhythm GI: soft, non-tender; bowel sounds normal; no masses,  no organomegaly Extremities: extremities normal, atraumatic, no cyanosis or edema Pulses: 2+ and symmetric Skin: Skin color, texture, turgor normal. No rashes or lesions Neurologic: Grossly normal   Labs on Admission:   [facility] 06/18/11 0214  NA 138  K 3.2*  CL 93*  CO2 29  GLUCOSE 97  BUN 25*  CREATININE 6.29*  CALCIUM 9.8  MG --  PHOS --    Basename 06/18/11 0214  WBC 9.2  NEUTROABS 6.1  HGB 9.2*  HCT 29.1*  MCV 94.8  PLT 377    Radiological Exams on Admission: Dg Chest 2 View  06/18/2011  *  RADIOLOGY REPORT*  Clinical Data: Shortness of breath.  Rapid heart rate.  History of CHF.  CHEST - 2 VIEW  Comparison: 06/12/2011  Findings: Cardiac enlargement with some increased pulmonary vascularity and interstitial edema versus fibrosis.  Improving infiltrates and effusions since previous study.  Small residual pleural effusions.  IMPRESSION: Congestive changes in the heart lungs with interstitial edema and small bilateral pleural effusions.  Improving infiltrates and effusions since previous study.  Original Report Authenticated By: Marlon Pel, [name]   Dg Chest 2 View  06/12/2011  *RADIOLOGY REPORT*  Clinical Data: CHF, shortness of breath, pneumonia.  CHEST - 2 VIEW   Comparison: 06/10/2011  Findings: Cardiomegaly with vascular congestion.  Improving edema pattern.  Bilateral pleural effusions.  Bilateral lower lobe atelectasis or infiltrates.  IMPRESSION: Improving edema.  Continued bilateral effusions and bibasilar atelectasis or infiltrates.  Original Report Authenticated By: Cyndie Chime, [name]   Portable Chest Xray In Am  06/10/2011  *RADIOLOGY REPORT*  Clinical Data: Follow up of CHF.  Respiratory failure.  PORTABLE CHEST - 1 VIEW  Comparison: 1 day prior  Findings: Midline trachea.  Moderate cardiomegaly.  Increased small left and persistent small right pleural effusion. No pneumothorax. No change in lower lobe predominant interstitial and airspace disease.  IMPRESSION:  1.  No change in congestive heart failure. 2.  Persistent small right and slight increase in small left pleural effusion. 3.  Lower lobe predominant airspace disease.  Favor atelectasis or alveolar edema.  Concurrent infection cannot be excluded.  Original Report Authenticated By: Consuello Bossier, [name]   Dg Chest Port 1 View  06/09/2011  *RADIOLOGY REPORT*  Clinical Data: Shortness of breath and cough.  PORTABLE CHEST - 1 VIEW  Comparison: Chest radiograph performed 07/25/2010  Findings: The lungs are relatively well expanded.  Diffuse hazy left-sided airspace opacification is noted, and patchy right perihilar and right basilar airspace opacities are seen.  Small bilateral pleural effusions are noted.  Findings raise concern for pulmonary edema, though superimposed pneumonia cannot be excluded. No pneumothorax is identified.  The cardiomediastinal silhouette is enlarged.  No acute osseous abnormalities are seen.  IMPRESSION: Diffuse hazy left-sided airspace opacification, and patchy right perihilar and right basilar airspace opacities, with small bilateral pleural effusions and underlying cardiomegaly.  Findings concerning for pulmonary edema, though superimposed pneumonia cannot be excluded.   Original Report Authenticated By: Tonia Ghent, [name]    Assessment/Plan Present on Admission:  77 year old female dialysis patient who recently is getting over pneumonia who presents with new onset A. fib with RVR  .A-fib with RVR she is currently on nifedipine going to hold that in place on Cardizem drip to help better rate control. Will obtain 2-D echo and serial cardiac enzymes.  Marland KitchenESRD (end stage renal disease) on dialysis obtain nephrology consultation.  .Aortic stenosis, mild repeat 2-D echo again.  .Pneumonia recent admission continue Levaquin as she is currently on as an outpatient.  [name] A 06/18/2011, 5:19 AM

## 2011-06-18 NOTE — ED Notes (Signed)
Attempted IV x2 without success  

## 2011-06-19 LAB — BASIC METABOLIC PANEL
CO2: 31 mEq/L (ref 19–32)
Calcium: 8.6 mg/dL (ref 8.4–10.5)
Glucose, Bld: 75 mg/dL (ref 70–99)
Sodium: 139 mEq/L (ref 135–145)

## 2011-06-19 LAB — CBC
Hemoglobin: 8.1 g/dL — ABNORMAL LOW (ref 12.0–15.0)
MCH: 29 pg (ref 26.0–34.0)
RBC: 2.79 MIL/uL — ABNORMAL LOW (ref 3.87–5.11)

## 2011-06-19 LAB — CARDIAC PANEL(CRET KIN+CKTOT+MB+TROPI)
CK, MB: 2.4 ng/mL (ref 0.3–4.0)
Troponin I: <0.3 ng/mL (ref <0.30)

## 2011-06-19 LAB — HEPATITIS B SURFACE ANTIGEN: Hepatitis B Surface Ag: NEGATIVE

## 2011-06-19 MED ORDER — DILTIAZEM HCL ER COATED BEADS 120 MG PO TB24: 120.0000 mg | ORAL_TABLET | Freq: Every day | ORAL | Status: DC

## 2011-06-19 NOTE — Discharge Summary (Signed)
Physician Discharge Summary  Patient ID: [name] MRN: [id number] DOB/AGE: [birth year] 77 y.o. Primary Care Physician:HILL,GERALD K, MD Admit date: 06/18/2011 Discharge date: 06/19/2011    Discharge Diagnoses:  1. Atrial fibrillation with rapid ventricle response, converted back to sinus rhythm quickly. 2. Recent pneumonia, improving clinically and radiologically. 3. End stage renal disease on hemodialysis. 4. Mild aortic stenosis.   Current Discharge Medication List    START taking these medications   Details  diltiazem (CARDIZEM LA) 120 MG 24 hr tablet Take 1 tablet (120 mg total) by mouth daily. Qty: 30 tablet, Refills: 0      CONTINUE these medications which have NOT CHANGED   Details  atorvastatin (LIPITOR) 40 MG tablet Take 40 mg by mouth daily.      benzonatate (TESSALON) 100 MG capsule Take 100 mg by mouth 3 (three) times daily as needed. For cough     calcitRIOL (ROCALTROL) 0.5 MCG capsule Take 0.5 mcg by mouth daily.     labetalol (NORMODYNE) 200 MG tablet Take 200 mg by mouth 2 (two) times daily.      sodium bicarbonate 650 MG tablet Take 1,300 mg by mouth 3 (three) times daily.        STOP taking these medications     NIFEdipine (PROCARDIA XL/ADALAT-CC) 90 MG 24 hr tablet         Discharged Condition: Improved and stable.    Consults: Nephrology, Dr Fausto Skillern for hemodialysis.  Significant Diagnostic Studies: Dg Chest 2 View  06/18/2011  *RADIOLOGY REPORT*  Clinical Data: Shortness of breath.  Rapid heart rate.  History of CHF.  CHEST - 2 VIEW  Comparison: 06/12/2011  Findings: Cardiac enlargement with some increased pulmonary vascularity and interstitial edema versus fibrosis.  Improving infiltrates and effusions since previous study.  Small residual pleural effusions.  IMPRESSION: Congestive changes in the heart lungs with interstitial edema and small bilateral pleural effusions.  Improving infiltrates and effusions since previous study.   Original Report Authenticated By: Marlon Pel, [name]   Dg Chest 2 View  06/12/2011  *RADIOLOGY REPORT*  Clinical Data: CHF, shortness of breath, pneumonia.  CHEST - 2 VIEW  Comparison: 06/10/2011  Findings: Cardiomegaly with vascular congestion.  Improving edema pattern.  Bilateral pleural effusions.  Bilateral lower lobe atelectasis or infiltrates.  IMPRESSION: Improving edema.  Continued bilateral effusions and bibasilar atelectasis or infiltrates.  Original Report Authenticated By: Cyndie Chime, [name]   Portable Chest Xray In Am  06/10/2011  *RADIOLOGY REPORT*  Clinical Data: Follow up of CHF.  Respiratory failure.  PORTABLE CHEST - 1 VIEW  Comparison: 1 day prior  Findings: Midline trachea.  Moderate cardiomegaly.  Increased small left and persistent small right pleural effusion. No pneumothorax. No change in lower lobe predominant interstitial and airspace disease.  IMPRESSION:  1.  No change in congestive heart failure. 2.  Persistent small right and slight increase in small left pleural effusion. 3.  Lower lobe predominant airspace disease.  Favor atelectasis or alveolar edema.  Concurrent infection cannot be excluded.  Original Report Authenticated By: Consuello Bossier, [name]   Dg Chest Port 1 View  06/09/2011  *RADIOLOGY REPORT*  Clinical Data: Shortness of breath and cough.  PORTABLE CHEST - 1 VIEW  Comparison: Chest radiograph performed 07/25/2010  Findings: The lungs are relatively well expanded.  Diffuse hazy left-sided airspace opacification is noted, and patchy right perihilar and right basilar airspace opacities are seen.  Small bilateral pleural effusions are noted.  Findings raise concern for  pulmonary edema, though superimposed pneumonia cannot be excluded. No pneumothorax is identified.  The cardiomediastinal silhouette is enlarged.  No acute osseous abnormalities are seen.  IMPRESSION: Diffuse hazy left-sided airspace opacification, and patchy right perihilar and right basilar  airspace opacities, with small bilateral pleural effusions and underlying cardiomegaly.  Findings concerning for pulmonary edema, though superimposed pneumonia cannot be excluded.  Original Report Authenticated By: Tonia Ghent, [name]    Lab Results: Basic Metabolic Panel:  [facility] 06/19/11 0459 06/18/11 0214  NA 139 138  K 4.0 3.2*  CL 98 93*  CO2 31 29  GLUCOSE 75 97  BUN 16 25*  CREATININE 4.72* 6.29*  CALCIUM 8.6 9.8  MG -- --  PHOS -- --       CBC:  Basename 06/19/11 0459 06/18/11 0214  WBC 6.8 9.2  NEUTROABS -- 6.1  HGB 8.1* 9.2*  HCT 26.8* 29.1*  MCV 96.1 94.8  PLT 304 377    Recent Results (from the past 240 hour(s))  MRSA PCR SCREENING     Status: Normal   Collection Time   06/09/11  8:50 AM      Component Value Range Status Comment   MRSA by PCR NEGATIVE  NEGATIVE  Final   CULTURE, BLOOD (ROUTINE X 2)     Status: Normal   Collection Time   06/09/11 11:25 AM      Component Value Range Status Comment   Specimen Description BLOOD LEFT HAND   Final    Special Requests     Final    Value: BOTTLES DRAWN AEROBIC AND ANAEROBIC 6CC EACH BOTTLE   Culture NO GROWTH 5 DAYS   Final    Report Status 06/14/2011 FINAL   Final   CULTURE, BLOOD (ROUTINE X 2)     Status: Normal   Collection Time   06/09/11 11:35 AM      Component Value Range Status Comment   Specimen Description BLOOD LEFT HAND   Final    Special Requests     Final    Value: BOTTLES DRAWN AEROBIC AND ANAEROBIC 8CC EACH BOTTLE   Culture NO GROWTH 5 DAYS   Final    Report Status 06/14/2011 FINAL   Final   MRSA PCR SCREENING     Status: Normal   Collection Time   06/18/11  5:19 AM      Component Value Range Status Comment   MRSA by PCR NEGATIVE  NEGATIVE  Final      Hospital Course: This very pleasant 77 year old lady was admitted to the hospital because of palpitations. Please see initial history and physical examination. Fortunately, after she was put on intravenous Cardizem drip, she converted  back to sinus rhythm within a matter of less than 12 hours. Serial cardiac enzymes did not show any significant rise indicative of ischemia or infarction. She had hemodialysis while she was in the hospital. She did not require full anticoagulation in view of the atrial fibrillation. She feels well now and wishes to go home.  Discharge Exam: Blood pressure 147/58, pulse 67, temperature 98.3 F (36.8 C), temperature source Oral, resp. rate 19, height 5\' 6"  (1.676 m), weight 61.7 kg (136 lb 0.4 oz), SpO2 98.00%. She looks systemically well. Heart sounds are present and in sinus rhythm. She has a systolic murmur typical of her aortic stenosis that has been documented recently. Lung fields are relatively clear with slightly reduced air entry in the right mid and lower zones. She is alert and orientated.  Disposition: Home.  She has followup appointments with her cardiologist Dr. Marvis Moeller and I've encouraged her to keep this appointment.  Discharge Orders    Future Orders Please Complete By Expires   Diet - low sodium heart healthy      Increase activity slowly      Discharge instructions      Comments:   Please come to the hospital if you have palpitations again.      Follow-up Information    Follow up with HILL,GERALD K .         SignedWilson Singer Pager [phone]  06/19/2011, 8:03 AM

## 2011-06-19 NOTE — Progress Notes (Signed)
[name]  MRN: [id number]  DOB/AGE: [birth year] 75 y.o.  Primary Care Physician:HILL,GERALD K, MD  Admit date: 06/18/2011  Chief Complaint:  Chief Complaint  Patient presents with  . Shortness of Breath    S-Pt presented on  06/18/2011 with  Chief Complaint  Patient presents with  . Shortness of Breath  .    Pt today feels better      . calcitRIOL  0.5 mcg Oral Daily  . diltiazem  30 mg Oral Q6H  . epoetin alfa  14,000 Units Intravenous 3 times weekly  . labetalol  200 mg Oral BID  . levofloxacin  500 mg Oral Q48H  . rosuvastatin  20 mg Oral Daily  . sodium bicarbonate  1,300 mg Oral TID  . sodium chloride  3 mL Intravenous Q12H         NWG:NFAOZ from the symptoms mentioned above,there are no other symptoms referable to all systems reviewed.  Physical Exam: Vital signs in last 24 hours: Temp:  [97 F (36.1 C)-98.7 F (37.1 C)] 98.3 F (36.8 C) (12/12 0400) Pulse Rate:  [60-81] 67  (12/12 0600) Resp:  [12-25] 19  (12/12 0600) BP: (96-155)/(43-91) 147/58 mmHg (12/12 0600) SpO2:  [88 %-100 %] 98 % (12/12 0600) Weight:  [132 lb 7.9 oz (60.1 kg)-136 lb 14.5 oz (62.1 kg)] 136 lb 0.4 oz (61.7 kg) (12/12 0500) Weight change: 6 lb 14.5 oz (3.132 kg) Last BM Date: 06/08/11  Intake/Output from previous day: 12/11 0701 - 12/12 0700 In: 200 [P.O.:200] Out: -  Total I/O In: 200 [P.O.:200] Out: -    Physical Exam: General- pt is awake,alert, oriented to time place and person Resp- No acute REsp distress, CTA B/L NO Rhonchi CVS- S1S2 regular in rate and rhythm,SEM + GIT- BS+, soft, NT, ND EXT- NO LE Edema, Cyanosis   Lab Results: CBC  Basename 06/19/11 0459 06/18/11 0214  WBC 6.8 9.2  HGB 8.1* 9.2*  HCT 26.8* 29.1*  PLT 304 377    BMET  Basename 06/19/11 0459 06/18/11 0214  NA 139 138  K 4.0 3.2*  CL 98 93*  CO2 31 29  GLUCOSE 75 97  BUN 16 25*  CREATININE 4.72* 6.29*  CALCIUM 8.6 9.8    MICRO Recent Results (from the past 240  hour(s))  MRSA PCR SCREENING     Status: Normal   Collection Time   06/09/11  8:50 AM      Component Value Range Status Comment   MRSA by PCR NEGATIVE  NEGATIVE  Final   CULTURE, BLOOD (ROUTINE X 2)     Status: Normal   Collection Time   06/09/11 11:25 AM      Component Value Range Status Comment   Specimen Description BLOOD LEFT HAND   Final    Special Requests     Final    Value: BOTTLES DRAWN AEROBIC AND ANAEROBIC 6CC EACH BOTTLE   Culture NO GROWTH 5 DAYS   Final    Report Status 06/14/2011 FINAL   Final   CULTURE, BLOOD (ROUTINE X 2)     Status: Normal   Collection Time   06/09/11 11:35 AM      Component Value Range Status Comment   Specimen Description BLOOD LEFT HAND   Final    Special Requests     Final    Value: BOTTLES DRAWN AEROBIC AND ANAEROBIC 8CC EACH BOTTLE   Culture NO GROWTH 5 DAYS   Final    Report Status 06/14/2011 FINAL  Final   MRSA PCR SCREENING     Status: Normal   Collection Time   06/18/11  5:19 AM      Component Value Range Status Comment   MRSA by PCR NEGATIVE  NEGATIVE  Final       Lab Results  Component Value Date   PTH 399.0* 02/26/2011   CALCIUM 8.6 06/19/2011   CAION 1.10* 06/09/2011   PHOS 2.8 06/10/2011               Impression: 1)Renal  ESRD ON  HD    On TTS schedule  was last dilayzed eyesterday      2)HTN   BP at goal Target Organ damage  CKD  CHF  3)Anemia HGb not  at goal (9--11)                 On Epo  4)CKD Mineral-Bone Disorder PTH acceptable  Secondary Hyperparathyroidism  present Phosphorus at goal. Vitamin 25-OH will check/low/at goal.  5) Afib- stable   6)Acid base Co2 at goal     Plan: Agree with current treatment and plan      Kolyn Rozario S 06/19/2011, 6:31 AM

## 2011-07-04 ENCOUNTER — Emergency Department (HOSPITAL_COMMUNITY)
Admission: EM | Admit: 2011-07-04 | Discharge: 2011-07-04 | Disposition: A | Discharge status: Home / Self Care | Payer: PRIVATE HEALTH INSURANCE | Attending: Emergency Medicine | Admitting: Emergency Medicine

## 2011-07-04 ENCOUNTER — Encounter (HOSPITAL_COMMUNITY): Payer: Self-pay | Admitting: General Practice

## 2011-07-04 ENCOUNTER — Other Ambulatory Visit: Payer: Self-pay

## 2011-07-04 ENCOUNTER — Emergency Department (HOSPITAL_COMMUNITY): Payer: PRIVATE HEALTH INSURANCE

## 2011-07-04 DIAGNOSIS — I120 Hypertensive chronic kidney disease with stage 5 chronic kidney disease or end stage renal disease: Secondary | ICD-10-CM | POA: Insufficient documentation

## 2011-07-04 DIAGNOSIS — Z992 Dependence on renal dialysis: Secondary | ICD-10-CM

## 2011-07-04 DIAGNOSIS — I517 Cardiomegaly: Secondary | ICD-10-CM | POA: Insufficient documentation

## 2011-07-04 DIAGNOSIS — N186 End stage renal disease: Secondary | ICD-10-CM | POA: Insufficient documentation

## 2011-07-04 DIAGNOSIS — J811 Chronic pulmonary edema: Secondary | ICD-10-CM | POA: Insufficient documentation

## 2011-07-04 DIAGNOSIS — I2510 Atherosclerotic heart disease of native coronary artery without angina pectoris: Secondary | ICD-10-CM | POA: Insufficient documentation

## 2011-07-04 DIAGNOSIS — R0602 Shortness of breath: Secondary | ICD-10-CM | POA: Insufficient documentation

## 2011-07-04 DIAGNOSIS — Z79899 Other long term (current) drug therapy: Secondary | ICD-10-CM | POA: Insufficient documentation

## 2011-07-04 LAB — CBC
HCT: 30.6 % — ABNORMAL LOW (ref 36.0–46.0)
Hemoglobin: 9.7 g/dL — ABNORMAL LOW (ref 12.0–15.0)
MCH: 30.1 pg (ref 26.0–34.0)
MCV: 95 fL (ref 78.0–100.0)
Platelets: 406 10*3/uL — ABNORMAL HIGH (ref 150–400)
RBC: 3.22 MIL/uL — ABNORMAL LOW (ref 3.87–5.11)

## 2011-07-04 LAB — BASIC METABOLIC PANEL
BUN: 39 mg/dL — ABNORMAL HIGH (ref 6–23)
Calcium: 9.8 mg/dL (ref 8.4–10.5)
Creatinine, Ser: 6.53 mg/dL — ABNORMAL HIGH (ref 0.50–1.10)
GFR calc non Af Amer: 5 mL/min — ABNORMAL LOW (ref >90)
Glucose, Bld: 112 mg/dL — ABNORMAL HIGH (ref 70–99)

## 2011-07-04 LAB — DIFFERENTIAL
Eosinophils Absolute: 0.2 10*3/uL (ref 0.0–0.7)
Eosinophils Relative: 2 % (ref 0–5)
Lymphs Abs: 2.1 10*3/uL (ref 0.7–4.0)
Monocytes Absolute: 0.7 10*3/uL (ref 0.1–1.0)
Monocytes Relative: 7 % (ref 3–12)

## 2011-07-04 NOTE — ED Notes (Signed)
Dialysis center contacted and stating they can dialyze patient today. Family will transport patient to dialysis center. Dr Colon Branch aware. Awaiting discharge papers.

## 2011-07-04 NOTE — ED Notes (Signed)
Patient with no complaints at this time. Respirations even and unlabored. Skin warm/dry. Discharge instructions reviewed with patient at this time. Patient given opportunity to voice concerns/ask questions. IV removed per policy and band-aid applied to site. Patient discharged at this time and left Emergency Department with steady gait.  

## 2011-07-04 NOTE — ED Notes (Signed)
Pt c/o increasing SOB last night about 1000.  Pt states she was supposed to have Dialysis today and called EMS this morning due to increasing SOB and unable to sleep.

## 2011-07-04 NOTE — ED Provider Notes (Signed)
Scribed for [employer]. Colon Branch, MD, the patient was seen in room APA14/APA14 . This chart was scribed by Ellie Lunch.   CSN: [id number]  Arrival date & time 07/04/11  0818   First MD Initiated Contact with Patient 07/04/11 [phone]      Chief Complaint  Patient presents with  . Shortness of Breath    (Consider location/radiation/quality/duration/timing/severity/associated sxs/prior treatment) HPI [name] is a 77 y.o. female who presents to the Emergency Department complaining of sudden onset SOB last night at 22:00. SOB is worse with exertion and supine position. Pt usually sleeps elevated on 3 pillows, but had to sleep sitting up last night. Pt denies associated CP, diaphoresis, cough, fever, chills, nausea, and vomiting. Pt c/o some swelling in her lower extremities. Pt has h/o CHF, a. Fibb, and is a T/TH/Sat dialysis patient for the past two months as a result of hypertension.   PCP Dr. Loleta Chance, Cardiologist Dr. Gery Pray, Nephrologist Dr. Kristian Covey  Past Medical History  Diagnosis Date  . Anemia 03/06/2011  . Hypertension   . Coronary artery disease   . Renal disorder   . Aortic stenosis, mild     Past Surgical History  Procedure Date  . Av fistula placement     L arm    No family history on file.  History  Substance Use Topics  . Smoking status: Never Smoker   . Smokeless tobacco: Not on file  . Alcohol Use: No     Review of Systems 10 Systems reviewed and are negative for acute change except as noted in the HPI.  Allergies  Review of patient's allergies indicates no known allergies.  Home Medications   Current Outpatient Rx  Name Route Sig Dispense Refill  . ATORVASTATIN CALCIUM 40 MG PO TABS Oral Take 40 mg by mouth daily.      Marland Kitchen BENZONATATE 100 MG PO CAPS Oral Take 100 mg by mouth 3 (three) times daily as needed. For cough     . CALCITRIOL 0.5 MCG PO CAPS Oral Take 0.5 mcg by mouth daily.     Marland Kitchen DILTIAZEM HCL ER COATED BEADS 120 MG PO TB24 Oral Take 1 tablet  (120 mg total) by mouth daily. 30 tablet 0  . LABETALOL HCL 200 MG PO TABS Oral Take 200 mg by mouth 2 (two) times daily.      . SODIUM BICARBONATE 650 MG PO TABS Oral Take 1,300 mg by mouth 3 (three) times daily.        BP 162/91  Pulse 79  Temp(Src) 98.4 F (36.9 C) (Oral)  Resp 25  Ht 5\' 5"  (1.651 m)  Wt 125 lb (56.7 kg)  BMI 20.80 kg/m2  SpO2 94%  Physical Exam  Nursing note and vitals reviewed. Constitutional: She is oriented to person, place, and time. She appears well-developed and well-nourished. No distress.  HENT:  Head: Normocephalic and atraumatic.  Right Ear: External ear normal.  Left Ear: External ear normal.  Eyes: EOM are normal. Pupils are equal, round, and reactive to light.  Neck: Neck supple. JVD (bilaterally) present.  Cardiovascular: Normal rate and regular rhythm.  Exam reveals no gallop and no friction rub.   No murmur heard. Pulmonary/Chest: No respiratory distress.       Poor respiratory effort. Crackles 1/3 way up on both sides   Abdominal: Soft. There is no tenderness.  Musculoskeletal:       AV graft left upper arm with good bruit and thrill  Neurological: She is alert and  oriented to person, place, and time.  Skin: Skin is warm and dry.  Psychiatric: She has a normal mood and affect.    ED Course  Procedures (including critical care time) DIAGNOSTIC STUDIES: Oxygen Saturation is 94% on [place], adequate by my interpretation.    COORDINATION OF CA Results for orders placed during the hospital encounter of 07/04/11  CBC      Component Value Range   WBC 9.8  4.0 - 10.5 (K/uL)   RBC 3.22 (*) 3.87 - 5.11 (MIL/uL)   Hemoglobin 9.7 (*) 12.0 - 15.0 (g/dL)   HCT 40.9 (*) 81.1 - 46.0 (%)   MCV 95.0  78.0 - 100.0 (fL)   MCH 30.1  26.0 - 34.0 (pg)   MCHC 31.7  30.0 - 36.0 (g/dL)   RDW 91.4 (*) 78.2 - 15.5 (%)   Platelets 406 (*) 150 - 400 (K/uL)  DIFFERENTIAL      Component Value Range   Neutrophils Relative 70  43 - 77 (%)   Neutro Abs 6.9  1.7  - 7.7 (K/uL)   Lymphocytes Relative 21  12 - 46 (%)   Lymphs Abs 2.1  0.7 - 4.0 (K/uL)   Monocytes Relative 7  3 - 12 (%)   Monocytes Absolute 0.7  0.1 - 1.0 (K/uL)   Eosinophils Relative 2  0 - 5 (%)   Eosinophils Absolute 0.2  0.0 - 0.7 (K/uL)   Basophils Relative 1  0 - 1 (%)   Basophils Absolute 0.1  0.0 - 0.1 (K/uL)  BASIC METABOLIC PANEL      Component Value Range   Sodium 140  135 - 145 (mEq/L)   Potassium 3.4 (*) 3.5 - 5.1 (mEq/L)   Chloride 95 (*) 96 - 112 (mEq/L)   CO2 29  19 - 32 (mEq/L)   Glucose, Bld 112 (*) 70 - 99 (mg/dL)   BUN 39 (*) 6 - 23 (mg/dL)   Creatinine, Ser 9.56 (*) 0.50 - 1.10 (mg/dL)   Calcium 9.8  8.4 - 21.3 (mg/dL)   GFR calc non Af Amer 5 (*) >90 (mL/min)   GFR calc Af Amer 6 (*) >90 (mL/min)  PRO B NATRIURETIC PEPTIDE      Component Value Range   Pro B Natriuretic peptide (BNP) 70000.0 (*) 0 - 450 (pg/mL)   Dg Chest Portable 1 View  07/04/2011  *RADIOLOGY REPORT*  Clinical Data: 77 year old female with shortness of breath. Diabetes.  PORTABLE CHEST - 1 VIEW  Comparison: 06/18/2011 and earlier.  Findings: AP portable upright view 0906 hours.  The new moderate bilateral pleural effusions.  Indistinctness of vascularity increased interstitial opacity compatible with edema. Stable cardiomegaly and mediastinal contours.  Visualized tracheal air column is within normal limits.  No pneumothorax.  IMPRESSION: Acute pulmonary edema with increased bilateral effusions.  Original Report Authenticated By: Harley Hallmark, [name]     Date: 07/04/2011  0865  Rate:77  Rhythm: normal sinus rhythm  QRS Axis: normal  Intervals: QT prolonged  ST/T Wave abnormalities: normal  Conduction Disutrbances:none  Narrative Interpretation:   Old EKG Reviewed: changes notedc/w 06/18/11 NSR has replaced Atrial flutter, QT prolongation is new.  9:04 AM Plan to xray chest, get bloodwork, BNP to rule out CHF.  9:58 AM Dialysis center contacted. Plan to send Pt over for dialysis. Pt  agrees to plan.    MDM  Diayisi patient here with shortness of breath that began last night. She is scheduled for dialysis this morning. Chest xray and labs confirm  she has pulmonary edema, no signs of infection. Family has agreed to take her directly to dialysis at [facility] on [address].Pt stable in ED with no significant deterioration in condition.The patient appears reasonably screened and/or stabilized for discharge and I doubt any other medical condition or other [facility] requiring further screening, evaluation, or treatment in the ED at this time prior to discharge.  I personally performed the services described in this documentation, which was scribed in my presence. The recorded information has been reviewed and considered.  MDM Reviewed: previous chart, nursing note and vitals Reviewed previous: labs, ECG and x-ray Interpretation: labs, ECG and x-ray Total time providing critical care: 40. Consults: dialysis center.            Nicoletta Dress. Colon Branch, MD 07/04/11 1004

## 2011-07-04 NOTE — ED Notes (Signed)
Patient on dialysis. Last dialyzed Monday. Normal dialysis rotation Tues/Thurs/Sat. Has not had dialysis this AM.

## 2012-05-21 ENCOUNTER — Other Ambulatory Visit (HOSPITAL_COMMUNITY): Payer: Self-pay

## 2012-05-21 ENCOUNTER — Other Ambulatory Visit (HOSPITAL_COMMUNITY): Payer: Self-pay | Admitting: Cardiovascular Disease

## 2012-05-21 DIAGNOSIS — R0989 Other specified symptoms and signs involving the circulatory and respiratory systems: Secondary | ICD-10-CM

## 2012-05-21 DIAGNOSIS — I359 Nonrheumatic aortic valve disorder, unspecified: Secondary | ICD-10-CM

## 2012-05-26 ENCOUNTER — Ambulatory Visit (HOSPITAL_COMMUNITY)
Admission: RE | Admit: 2012-05-26 | Discharge: 2012-05-26 | Disposition: A | Discharge status: Home / Self Care | Payer: PRIVATE HEALTH INSURANCE | Source: Ambulatory Visit | Attending: Cardiovascular Disease | Admitting: Cardiovascular Disease

## 2012-05-26 DIAGNOSIS — I4891 Unspecified atrial fibrillation: Secondary | ICD-10-CM | POA: Insufficient documentation

## 2012-05-26 DIAGNOSIS — I509 Heart failure, unspecified: Secondary | ICD-10-CM | POA: Insufficient documentation

## 2012-05-26 DIAGNOSIS — I2510 Atherosclerotic heart disease of native coronary artery without angina pectoris: Secondary | ICD-10-CM | POA: Insufficient documentation

## 2012-05-26 DIAGNOSIS — N19 Unspecified kidney failure: Secondary | ICD-10-CM | POA: Insufficient documentation

## 2012-05-26 DIAGNOSIS — I359 Nonrheumatic aortic valve disorder, unspecified: Secondary | ICD-10-CM | POA: Insufficient documentation

## 2012-05-26 DIAGNOSIS — I10 Essential (primary) hypertension: Secondary | ICD-10-CM | POA: Insufficient documentation

## 2012-05-26 DIAGNOSIS — R0989 Other specified symptoms and signs involving the circulatory and respiratory systems: Secondary | ICD-10-CM

## 2012-05-26 DIAGNOSIS — Z992 Dependence on renal dialysis: Secondary | ICD-10-CM | POA: Insufficient documentation

## 2012-05-26 NOTE — Progress Notes (Signed)
2 D echocardiogram performed. Metro Kung, RDCS

## 2012-05-28 NOTE — Progress Notes (Signed)
Carotid Duplex Completed. Markhi Kleckner D  

## 2012-07-08 DIAGNOSIS — I219 Acute myocardial infarction, unspecified: Secondary | ICD-10-CM

## 2012-07-08 HISTORY — DX: Acute myocardial infarction, unspecified: I21.9

## 2012-07-20 NOTE — Progress Notes (Signed)
This is not my patient.  Will Colleen Kotlarz, [name]

## 2012-10-14 ENCOUNTER — Encounter (HOSPITAL_COMMUNITY): Payer: Self-pay

## 2012-10-14 ENCOUNTER — Emergency Department (HOSPITAL_COMMUNITY)
Admission: EM | Admit: 2012-10-14 | Discharge: 2012-10-14 | Disposition: A | Discharge status: Home / Self Care | Payer: PRIVATE HEALTH INSURANCE | Attending: Emergency Medicine | Admitting: Emergency Medicine

## 2012-10-14 ENCOUNTER — Emergency Department (HOSPITAL_COMMUNITY): Payer: PRIVATE HEALTH INSURANCE

## 2012-10-14 DIAGNOSIS — Z79899 Other long term (current) drug therapy: Secondary | ICD-10-CM | POA: Insufficient documentation

## 2012-10-14 DIAGNOSIS — Z992 Dependence on renal dialysis: Secondary | ICD-10-CM | POA: Insufficient documentation

## 2012-10-14 DIAGNOSIS — R002 Palpitations: Secondary | ICD-10-CM | POA: Insufficient documentation

## 2012-10-14 DIAGNOSIS — Z862 Personal history of diseases of the blood and blood-forming organs and certain disorders involving the immune mechanism: Secondary | ICD-10-CM | POA: Insufficient documentation

## 2012-10-14 DIAGNOSIS — I2510 Atherosclerotic heart disease of native coronary artery without angina pectoris: Secondary | ICD-10-CM | POA: Insufficient documentation

## 2012-10-14 DIAGNOSIS — I4891 Unspecified atrial fibrillation: Secondary | ICD-10-CM | POA: Insufficient documentation

## 2012-10-14 DIAGNOSIS — I120 Hypertensive chronic kidney disease with stage 5 chronic kidney disease or end stage renal disease: Secondary | ICD-10-CM | POA: Insufficient documentation

## 2012-10-14 DIAGNOSIS — N186 End stage renal disease: Secondary | ICD-10-CM | POA: Insufficient documentation

## 2012-10-14 DIAGNOSIS — Z8679 Personal history of other diseases of the circulatory system: Secondary | ICD-10-CM | POA: Insufficient documentation

## 2012-10-14 LAB — BASIC METABOLIC PANEL
BUN: 16 mg/dL (ref 6–23)
Chloride: 94 mEq/L — ABNORMAL LOW (ref 96–112)
GFR calc Af Amer: 15 mL/min — ABNORMAL LOW (ref >90)
GFR calc non Af Amer: 13 mL/min — ABNORMAL LOW (ref >90)
Potassium: 3.3 mEq/L — ABNORMAL LOW (ref 3.5–5.1)

## 2012-10-14 LAB — CBC WITH DIFFERENTIAL/PLATELET
Basophils Absolute: 0.1 10*3/uL (ref 0.0–0.1)
Basophils Relative: 1 % (ref 0–1)
Eosinophils Absolute: 0.2 10*3/uL (ref 0.0–0.7)
Hemoglobin: 11.5 g/dL — ABNORMAL LOW (ref 12.0–15.0)
MCH: 29.9 pg (ref 26.0–34.0)
MCHC: 32.6 g/dL (ref 30.0–36.0)
Neutro Abs: 7.1 10*3/uL (ref 1.7–7.7)
Neutrophils Relative %: 72 % (ref 43–77)
Platelets: 282 10*3/uL (ref 150–400)
RDW: 13.9 % (ref 11.5–15.5)

## 2012-10-14 LAB — TROPONIN I: Troponin I: <0.3 ng/mL (ref <0.30)

## 2012-10-14 MED ORDER — DILTIAZEM HCL 100 MG IV SOLR
5.0000 mg/h | INTRAVENOUS | Status: DC
  Administered 2012-10-14: 5 mg/h via INTRAVENOUS
  Filled 2012-10-14: qty 100

## 2012-10-14 MED ORDER — DILTIAZEM HCL 100 MG IV SOLR: 5.0000 mg/h | INTRAVENOUS | Status: DC

## 2012-10-14 MED ORDER — DILTIAZEM HCL 25 MG/5ML IV SOLN
10.0000 mg | Freq: Once | INTRAVENOUS | Status: AC
  Administered 2012-10-14: 10 mg via INTRAVENOUS

## 2012-10-14 MED ORDER — DILTIAZEM LOAD VIA INFUSION: 10.0000 mg | Freq: Once | INTRAVENOUS | Status: DC

## 2012-10-14 NOTE — ED Notes (Signed)
Tolerated meal tray well. Family at bedside.

## 2012-10-14 NOTE — ED Provider Notes (Addendum)
History  This chart was scribed for Brittany Hutching, MD by Shari Heritage, ED Scribe. The patient was seen in room APA11/APA11. Patient's care was started at 1018.   CSN: [id number]  Arrival date & time 10/14/12  1013   First MD Initiated Contact with Patient 10/14/12 1018      Chief Complaint  Patient presents with  . Irregular Heart Beat    The history is provided by the patient and a relative. No language interpreter was used.    HPI Comments: [name] is a 79 y.o. female who presents to the Emergency Department complaining of persistent palpitations onset 1 hour ago. She says that her BP dropped while she was receiving her dialysis treatment so she was taken off the machine. She states that at the time, her heart began beating rapidly. Patient says that she had some shortness of breath earlier, but she is not having significant difficulty breathing at this time. Patient denies chest pain. She receives dialysis on MWF schedule. Patient says that she has experienced this irregular heart beat sensation several other times in the past. Other medical history includes hypertension, coronary artery disease, aortic stenosis and anemia.    Past Medical History  Diagnosis Date  . Anemia 03/06/2011  . Hypertension   . Coronary artery disease   . Renal disorder   . Aortic stenosis, mild     Past Surgical History  Procedure Laterality Date  . Av fistula placement      L arm    No family history on file.  History  Substance Use Topics  . Smoking status: Never Smoker   . Smokeless tobacco: Not on file  . Alcohol Use: No    OB History   Grav Para Term Preterm Abortions TAB SAB Ect Mult Living                  Review of Systems A complete 10 system review of systems was obtained and all systems are negative except as noted in the HPI and PMH.   Allergies  Review of patient's allergies indicates no known allergies.  Home Medications   Current Outpatient Rx  Name  Route  Sig   Dispense  Refill  . atorvastatin (LIPITOR) 40 MG tablet   Oral   Take 40 mg by mouth daily.           . benzonatate (TESSALON) 100 MG capsule   Oral   Take 100 mg by mouth 3 (three) times daily as needed. For cough          . calcitRIOL (ROCALTROL) 0.5 MCG capsule   Oral   Take 0.5 mcg by mouth daily.          Marland Kitchen EXPIRED: diltiazem (CARDIZEM LA) 120 MG 24 hr tablet   Oral   Take 1 tablet (120 mg total) by mouth daily.   30 tablet   0   . labetalol (NORMODYNE) 200 MG tablet   Oral   Take 200 mg by mouth 2 (two) times daily.           . sodium bicarbonate 650 MG tablet   Oral   Take 1,300 mg by mouth 3 (three) times daily.             Triage Vitals: BP 143/83  Pulse 140  Temp(Src) 97.7 F (36.5 C) (Oral)  Resp 22  Ht 5\' 5"  (1.651 m)  Wt 125 lb (56.7 kg)  BMI 20.8 kg/m2  SpO2 98%  Physical Exam  Nursing note and vitals reviewed. Constitutional: She is oriented to person, place, and time. She appears well-developed and well-nourished.  HENT:  Head: Normocephalic and atraumatic.  Eyes: Conjunctivae and EOM are normal. Pupils are equal, round, and reactive to light.  Neck: Normal range of motion. Neck supple.  Cardiovascular: Normal heart sounds.  An irregularly irregular rhythm present. Tachycardia present.   Pulmonary/Chest: Effort normal and breath sounds normal.  Abdominal: Soft. Bowel sounds are normal.  Musculoskeletal: Normal range of motion.  Neurological: She is alert and oriented to person, place, and time.  Skin: Skin is warm and dry.  Psychiatric: She has a normal mood and affect.    ED Course  Procedures (including critical care time) DIAGNOSTIC STUDIES: Oxygen Saturation is 98% on room air, normal by my interpretation.    COORDINATION OF CARE: 10:32 AM- Will give Cardizem to reduce HR. Patient informed of current plan for treatment and evaluation and agrees with plan at this time.    Date: 10/14/2012  Rate: 132  Rhythm: atrial  fibrillation  QRS Axis: normal  Intervals: normal  ST/T Wave abnormalities: ST depressions anteriorly, lat, inf  Conduction Disutrbances:none  Narrative Interpretation:   Old EKG Reviewed: changes noted     Date: 10/14/2012  Rate: 71  Rhythm: normal sinus rhythm  QRS Axis: normal  Intervals: QT prolonged  ST/T Wave abnormalities: normal  Conduction Disutrbances:none  Narrative Interpretation:   Old EKG Reviewed: changes noted    Results for orders placed during the hospital encounter of 10/14/12  CBC WITH DIFFERENTIAL      Result Value Range   WBC 9.9  4.0 - 10.5 K/uL   RBC 3.84 (*) 3.87 - 5.11 MIL/uL   Hemoglobin 11.5 (*) 12.0 - 15.0 g/dL   HCT 04.5 (*) 40.9 - 81.1 %   MCV 91.9  78.0 - 100.0 fL   MCH 29.9  26.0 - 34.0 pg   MCHC 32.6  30.0 - 36.0 g/dL   RDW 91.4  78.2 - 95.6 %   Platelets 282  150 - 400 K/uL   Neutrophils Relative 72  43 - 77 %   Neutro Abs 7.1  1.7 - 7.7 K/uL   Lymphocytes Relative 17  12 - 46 %   Lymphs Abs 1.7  0.7 - 4.0 K/uL   Monocytes Relative 9  3 - 12 %   Monocytes Absolute 0.9  0.1 - 1.0 K/uL   Eosinophils Relative 2  0 - 5 %   Eosinophils Absolute 0.2  0.0 - 0.7 K/uL   Basophils Relative 1  0 - 1 %   Basophils Absolute 0.1  0.0 - 0.1 K/uL  BASIC METABOLIC PANEL      Result Value Range   Sodium 136  135 - 145 mEq/L   Potassium 3.3 (*) 3.5 - 5.1 mEq/L   Chloride 94 (*) 96 - 112 mEq/L   CO2 28  19 - 32 mEq/L   Glucose, Bld 85  70 - 99 mg/dL   BUN 16  6 - 23 mg/dL   Creatinine, Ser 2.13 (*) 0.50 - 1.10 mg/dL   Calcium 8.8  8.4 - 08.6 mg/dL   GFR calc non Af Amer 13 (*) >90 mL/min   GFR calc Af Amer 15 (*) >90 mL/min  TROPONIN I      Result Value Range   Troponin I <0.30  <0.30 ng/mL   Labs Reviewed - No data to display   No results found.   No  diagnosis found.    MDM  Atrial fibrillation been restored to normal sinus rhythm after Cardizem bolus.  Patient has been observed for greater than 2 hours in normal sinus rhythm. She  wants to go home. She has close followup.      I personally performed the services described in this documentation, which was scribed in my presence. The recorded information has been reviewed and is accurate.    Brittany Hutching, MD 10/14/12 1449  Brittany Hutching, MD 10/14/12 [phone]

## 2012-10-14 NOTE — ED Notes (Signed)
Pt requested meal tray, md notified and tray ordered.

## 2012-10-14 NOTE — ED Notes (Signed)
Pt given meal tray w. Diet coke, family at bedside.

## 2012-10-14 NOTE — ED Notes (Signed)
Pt finished dialysis treatment about an hour ago. Complain of heart beating fast and BP low

## 2012-10-14 NOTE — ED Notes (Signed)
Patient converted to NSR. Dr Adriana Simas aware. New EKG obtained. Cardizem drip stopped for trial.

## 2012-11-10 ENCOUNTER — Inpatient Hospital Stay (HOSPITAL_COMMUNITY)
Admission: EM | Admit: 2012-11-10 | Discharge: 2012-11-12 | DRG: 308 | Disposition: A | Discharge status: Home / Self Care | Payer: PRIVATE HEALTH INSURANCE | Attending: Internal Medicine | Admitting: Internal Medicine

## 2012-11-10 ENCOUNTER — Encounter (HOSPITAL_COMMUNITY): Payer: Self-pay | Admitting: Pharmacy Technician

## 2012-11-10 ENCOUNTER — Emergency Department (HOSPITAL_COMMUNITY): Payer: PRIVATE HEALTH INSURANCE

## 2012-11-10 ENCOUNTER — Encounter (HOSPITAL_COMMUNITY): Payer: Self-pay | Admitting: Emergency Medicine

## 2012-11-10 DIAGNOSIS — I509 Heart failure, unspecified: Secondary | ICD-10-CM

## 2012-11-10 DIAGNOSIS — N2581 Secondary hyperparathyroidism of renal origin: Secondary | ICD-10-CM | POA: Diagnosis present

## 2012-11-10 DIAGNOSIS — I10 Essential (primary) hypertension: Secondary | ICD-10-CM

## 2012-11-10 DIAGNOSIS — I359 Nonrheumatic aortic valve disorder, unspecified: Secondary | ICD-10-CM

## 2012-11-10 DIAGNOSIS — J189 Pneumonia, unspecified organism: Secondary | ICD-10-CM

## 2012-11-10 DIAGNOSIS — E7800 Pure hypercholesterolemia, unspecified: Secondary | ICD-10-CM | POA: Diagnosis present

## 2012-11-10 DIAGNOSIS — I350 Nonrheumatic aortic (valve) stenosis: Secondary | ICD-10-CM

## 2012-11-10 DIAGNOSIS — D649 Anemia, unspecified: Secondary | ICD-10-CM

## 2012-11-10 DIAGNOSIS — I120 Hypertensive chronic kidney disease with stage 5 chronic kidney disease or end stage renal disease: Secondary | ICD-10-CM | POA: Diagnosis present

## 2012-11-10 DIAGNOSIS — R0600 Dyspnea, unspecified: Secondary | ICD-10-CM

## 2012-11-10 DIAGNOSIS — N186 End stage renal disease: Secondary | ICD-10-CM

## 2012-11-10 DIAGNOSIS — Z992 Dependence on renal dialysis: Secondary | ICD-10-CM

## 2012-11-10 DIAGNOSIS — I2510 Atherosclerotic heart disease of native coronary artery without angina pectoris: Secondary | ICD-10-CM | POA: Diagnosis present

## 2012-11-10 DIAGNOSIS — D631 Anemia in chronic kidney disease: Secondary | ICD-10-CM | POA: Diagnosis present

## 2012-11-10 DIAGNOSIS — E785 Hyperlipidemia, unspecified: Secondary | ICD-10-CM | POA: Diagnosis present

## 2012-11-10 DIAGNOSIS — I4891 Unspecified atrial fibrillation: Principal | ICD-10-CM

## 2012-11-10 HISTORY — DX: Heart failure, unspecified: I50.9

## 2012-11-10 HISTORY — DX: Unspecified atrial fibrillation: I48.91

## 2012-11-10 LAB — BASIC METABOLIC PANEL
Chloride: 95 mEq/L — ABNORMAL LOW (ref 96–112)
GFR calc Af Amer: 6 mL/min — ABNORMAL LOW (ref >90)
GFR calc non Af Amer: 5 mL/min — ABNORMAL LOW (ref >90)
Potassium: 4 mEq/L (ref 3.5–5.1)
Sodium: 137 mEq/L (ref 135–145)

## 2012-11-10 LAB — CBC WITH DIFFERENTIAL/PLATELET
Basophils Relative: 1 % (ref 0–1)
Eosinophils Absolute: 0.4 10*3/uL (ref 0.0–0.7)
Hemoglobin: 12.6 g/dL (ref 12.0–15.0)
Lymphs Abs: 3.7 10*3/uL (ref 0.7–4.0)
MCH: 30.4 pg (ref 26.0–34.0)
MCHC: 33.4 g/dL (ref 30.0–36.0)
Monocytes Relative: 11 % (ref 3–12)
Neutro Abs: 4.4 10*3/uL (ref 1.7–7.7)
Neutrophils Relative %: 46 % (ref 43–77)
Platelets: 271 10*3/uL (ref 150–400)
RBC: 4.15 MIL/uL (ref 3.87–5.11)

## 2012-11-10 LAB — LIPID PANEL
Cholesterol: 220 mg/dL — ABNORMAL HIGH (ref 0–200)
HDL: 95 mg/dL (ref >39)
Triglycerides: 122 mg/dL (ref <150)

## 2012-11-10 LAB — PROTIME-INR: INR: 1.07 (ref 0.00–1.49)

## 2012-11-10 MED ORDER — SODIUM BICARBONATE 650 MG PO TABS
1300.0000 mg | ORAL_TABLET | Freq: Two times a day (BID) | ORAL | Status: DC
  Administered 2012-11-10 – 2012-11-11 (×3): 1300 mg via ORAL
  Filled 2012-11-10: qty 1
  Filled 2012-11-10: qty 2
  Filled 2012-11-10: qty 1
  Filled 2012-11-10: qty 2

## 2012-11-10 MED ORDER — SODIUM CHLORIDE 0.9 % IJ SOLN: 3.0000 mL | INTRAMUSCULAR | Status: DC | PRN

## 2012-11-10 MED ORDER — DILTIAZEM HCL 25 MG/5ML IV SOLN
10.0000 mg | Freq: Once | INTRAVENOUS | Status: AC
  Administered 2012-11-10: 10 mg via INTRAVENOUS

## 2012-11-10 MED ORDER — HEPARIN SODIUM (PORCINE) 5000 UNIT/ML IJ SOLN
5000.0000 [IU] | Freq: Three times a day (TID) | INTRAMUSCULAR | Status: DC
  Administered 2012-11-10 – 2012-11-12 (×5): 5000 [IU] via SUBCUTANEOUS
  Filled 2012-11-10 (×6): qty 1

## 2012-11-10 MED ORDER — SODIUM CHLORIDE 0.9 % IJ SOLN
3.0000 mL | Freq: Two times a day (BID) | INTRAMUSCULAR | Status: DC
  Administered 2012-11-10 – 2012-11-11 (×2): 3 mL via INTRAVENOUS

## 2012-11-10 MED ORDER — ONDANSETRON HCL 4 MG/2ML IJ SOLN: 4.0000 mg | Freq: Four times a day (QID) | INTRAMUSCULAR | Status: DC | PRN

## 2012-11-10 MED ORDER — SODIUM CHLORIDE 0.9 % IV SOLN: 250.0000 mL | INTRAVENOUS | Status: DC | PRN

## 2012-11-10 MED ORDER — DILTIAZEM HCL 100 MG IV SOLR
5.0000 mg/h | INTRAVENOUS | Status: DC
  Administered 2012-11-10: 5 mg/h via INTRAVENOUS

## 2012-11-10 MED ORDER — SEVELAMER CARBONATE 800 MG PO TABS
2400.0000 mg | ORAL_TABLET | Freq: Three times a day (TID) | ORAL | Status: DC
  Administered 2012-11-11 – 2012-11-12 (×4): 2400 mg via ORAL
  Filled 2012-11-10: qty 1
  Filled 2012-11-10: qty 3
  Filled 2012-11-10: qty 2
  Filled 2012-11-10: qty 3

## 2012-11-10 MED ORDER — ATORVASTATIN CALCIUM 40 MG PO TABS
40.0000 mg | ORAL_TABLET | Freq: Every day | ORAL | Status: DC
  Administered 2012-11-10 – 2012-11-11 (×2): 40 mg via ORAL
  Filled 2012-11-10 (×2): qty 1

## 2012-11-10 MED ORDER — DILTIAZEM HCL ER COATED BEADS 120 MG PO CP24
120.0000 mg | ORAL_CAPSULE | Freq: Every day | ORAL | Status: DC
  Administered 2012-11-11: 120 mg via ORAL
  Filled 2012-11-10 (×2): qty 1

## 2012-11-10 MED ORDER — LABETALOL HCL 200 MG PO TABS
200.0000 mg | ORAL_TABLET | Freq: Two times a day (BID) | ORAL | Status: DC
  Administered 2012-11-10 – 2012-11-11 (×3): 200 mg via ORAL
  Filled 2012-11-10 (×2): qty 1

## 2012-11-10 MED ORDER — ACETAMINOPHEN 650 MG RE SUPP: 650.0000 mg | Freq: Four times a day (QID) | RECTAL | Status: DC | PRN

## 2012-11-10 MED ORDER — WARFARIN SODIUM 5 MG PO TABS
5.0000 mg | ORAL_TABLET | Freq: Once | ORAL | Status: AC
  Administered 2012-11-10: 5 mg via ORAL
  Filled 2012-11-10: qty 1

## 2012-11-10 MED ORDER — ONDANSETRON HCL 4 MG PO TABS: 4.0000 mg | ORAL_TABLET | Freq: Four times a day (QID) | ORAL | Status: DC | PRN

## 2012-11-10 MED ORDER — ACETAMINOPHEN 325 MG PO TABS
650.0000 mg | ORAL_TABLET | Freq: Four times a day (QID) | ORAL | Status: DC | PRN
  Filled 2012-11-10: qty 2

## 2012-11-10 MED ORDER — SORBITOL 70 % PO SOLN
30.0000 mL | Freq: Every evening | ORAL | Status: DC | PRN
  Filled 2012-11-10: qty 30

## 2012-11-10 NOTE — ED Notes (Signed)
Report given to [place], RN ICU

## 2012-11-10 NOTE — ED Notes (Signed)
EDP back in to re eval 

## 2012-11-10 NOTE — Consult Note (Signed)
Reason for Consult: End-stage renal disease Referring Physician: Dr. Kristian Covey is an 79 y.o. female.  HPI: She is a patient who has history of coronary artery disease, hypertension end-stage renal disease on maintenance hemodialysis presently came with history of palpitation. According to the patient she was doing okay and then office and she started having some palpitations, feeling weak and sweating. She had similar episode about a couple of weeks ago. Presently she said she is feeling better. She denies any difficulty breathing and no orthopnea. Patient also denies any chest pain. Emergency room patient was found to have transient after fibrillation and admitted to ICU for further workup and management.  Past Medical History  Diagnosis Date  . Anemia 03/06/2011  . Hypertension   . Coronary artery disease   . Renal disorder   . Aortic stenosis, mild   . A-fib   . Acute CHF     Past Surgical History  Procedure Laterality Date  . Av fistula placement      L arm  . Other surgical history Bilateral     Surgery on both arms.     History reviewed. No pertinent family history.  Social History:  reports that she has never smoked. She does not have any smokeless tobacco history on file. She reports that she does not drink alcohol or use illicit drugs.  Allergies: No Known Allergies  Medications: I have reviewed the patient's current medications.  Results for orders placed during the hospital encounter of 11/10/12 (from the past 48 hour(s))  CBC WITH DIFFERENTIAL     Status: None   Collection Time    11/10/12 10:57 AM      Result Value Range   WBC 9.6  4.0 - 10.5 K/uL   RBC 4.15  3.87 - 5.11 MIL/uL   Hemoglobin 12.6  12.0 - 15.0 g/dL   HCT 86.5  78.4 - 69.6 %   MCV 90.8  78.0 - 100.0 fL   MCH 30.4  26.0 - 34.0 pg   MCHC 33.4  30.0 - 36.0 g/dL   RDW 29.5  28.4 - 13.2 %   Platelets 271  150 - 400 K/uL   Neutrophils Relative 46  43 - 77 %   Neutro Abs 4.4  1.7 -  7.7 K/uL   Lymphocytes Relative 39  12 - 46 %   Lymphs Abs 3.7  0.7 - 4.0 K/uL   Monocytes Relative 11  3 - 12 %   Monocytes Absolute 1.0  0.1 - 1.0 K/uL   Eosinophils Relative 4  0 - 5 %   Eosinophils Absolute 0.4  0.0 - 0.7 K/uL   Basophils Relative 1  0 - 1 %   Basophils Absolute 0.1  0.0 - 0.1 K/uL  BASIC METABOLIC PANEL     Status: Abnormal   Collection Time    11/10/12 10:57 AM      Result Value Range   Sodium 137  135 - 145 mEq/L   Potassium 4.0  3.5 - 5.1 mEq/L   Chloride 95 (*) 96 - 112 mEq/L   CO2 25  19 - 32 mEq/L   Glucose, Bld 88  70 - 99 mg/dL   BUN 43 (*) 6 - 23 mg/dL   Creatinine, Ser 4.40 (*) 0.50 - 1.10 mg/dL   Calcium 9.2  8.4 - 10.2 mg/dL   GFR calc non Af Amer 5 (*) >90 mL/min   GFR calc Af Amer 6 (*) >90 mL/min  Comment:            The eGFR has been calculated     using the CKD EPI equation.     This calculation has not been     validated in all clinical     situations.     eGFR's persistently     <90 mL/min signify     possible Chronic Kidney Disease.  TROPONIN I     Status: None   Collection Time    11/10/12 10:57 AM      Result Value Range   Troponin I <0.30  <0.30 ng/mL   Comment:            Due to the release kinetics of cTnI,     a negative result within the first hours     of the onset of symptoms does not rule out     myocardial infarction with certainty.     If myocardial infarction is still suspected,     repeat the test at appropriate intervals.  LIPID PANEL     Status: Abnormal   Collection Time    11/10/12 11:00 AM      Result Value Range   Cholesterol 220 (*) 0 - 200 mg/dL   Triglycerides 045  <409 mg/dL   HDL 95  >81 mg/dL   Total CHOL/HDL Ratio 2.3     VLDL 24  0 - 40 mg/dL   LDL Cholesterol 191 (*) 0 - 99 mg/dL   Comment:            Total Cholesterol/HDL:CHD Risk     Coronary Heart Disease Risk Table                         Men   Women      1/2 Average Risk   3.4   3.3      Average Risk       5.0   4.4      2 X  Average Risk   9.6   7.1      3 X Average Risk  23.4   11.0                Use the calculated Patient Ratio     above and the CHD Risk Table     to determine the patient's CHD Risk.                ATP III CLASSIFICATION (LDL):      <100     mg/dL   Optimal      478-295  mg/dL   Near or Above                        Optimal      130-159  mg/dL   Borderline      621-308  mg/dL   High      >657     mg/dL   Very High    Dg Chest Portable 1 View  11/10/2012  *RADIOLOGY REPORT*  Clinical Data: Palpitations and tachycardia.  PORTABLE CHEST - 1 VIEW  Comparison: 10/14/2012 and prior chest radiographs dating back to 12/09/2006  Findings: Cardiomegaly is again identified. Fullness of the right superior mediastinum is unchanged and probably represents vasculature or enlarged thyroid. There is no evidence of focal airspace disease, pulmonary edema, suspicious pulmonary nodule/mass, pleural effusion, or pneumothorax. No acute bony abnormalities are identified.  IMPRESSION: Cardiomegaly without  evidence of acute cardiopulmonary disease.   Original Report Authenticated By: Harmon Pier, [name]     Review of Systems  Constitutional: Negative for fever and chills.  Respiratory: Negative for shortness of breath.   Cardiovascular: Positive for palpitations. Negative for chest pain.  Gastrointestinal: Negative for heartburn and nausea.  Neurological: Positive for dizziness and weakness. Negative for focal weakness.   Blood pressure 128/47, pulse 73, temperature 98.2 F (36.8 C), temperature source Oral, resp. rate 18, height 5\' 4"  (1.626 m), weight 52.4 kg (115 lb 8.3 oz), SpO2 100.00%. Physical Exam  Eyes: No scleral icterus.  Neck: No JVD present.  Cardiovascular: Normal rate and regular rhythm.  Exam reveals no friction rub.   Murmur heard. Respiratory: No respiratory distress. She has no wheezes.  GI: She exhibits no distension. There is no tenderness.  Musculoskeletal: She exhibits no edema.     Assessment/Plan: Problem #1 palpitation seems to be secondary to transient a trial fibrillation. Presently patient is a symptomatic and seems to be in sinus rhythm. Problem #2 hypertension her blood pressure seems reasonably controlled Problem #3 end-stage renal disease she status post hemodialysis yesterday BUN and creatinine was in acceptable range normal potassium. Problem #4 anemia secondary to end-stage renal disease she is on Epogen Problem #5 history of mild aortic stenosis Problem #6 history of hypercholesterolemia  Problem #7 metabolic bone disease. Plan: We'll make arrangements for patient to get dialysis tomorrow We'll continue his other medications and follow her basic metabolic panel and phosphorus in the morning. We'll hold Epogen as hemoglobin seems to be high. [name] S 11/10/2012, 4:34 PM

## 2012-11-10 NOTE — ED Provider Notes (Signed)
History  This chart was scribed for Ward Givens, MD by Bennett Scrape, ED Scribe. This patient was seen in room APA19/APA19 and the patient's care was started at 11:00 AM.  CSN: [id number]  Arrival date & time 11/10/12  1046   First MD Initiated Contact with Patient 11/10/12 1100      Chief Complaint  Patient presents with  . Tachycardia     Patient is a 79 y.o. female presenting with palpitations. The history is provided by the patient. No language interpreter was used.  Palpitations  This is a recurrent problem. The current episode started less than 1 hour ago. The problem occurs constantly. The problem has not changed since onset.The problem is associated with an unknown factor. On average, each episode lasts 30 minutes. Pertinent negatives include no diaphoresis, no chest pain, no nausea and no vomiting. She has tried nothing for the symptoms.    HPI Comments: [name] is a 79 y.o. female who presents to the Emergency Department complaining of sudden onset, non-changing, constant palpitations described as a racing heart beat with associated lightheadedness that started 30-60 minutes ago. She was at the opthalmologist appointment  around 8:30 AM this morning and left after her husband's appointment about one hour later. Daughter was called to bring her back for an additional test and upon arriving developed the symptoms. Pt reports two prior episodes of the same with one prior admission. Last time she was seen for the same was seen April 9th, 2014. Daughter denies admission stating the pt converted on her own. Pt denies taking her daily medications today stating that she usually takes them around 9:30 AM. She is a Monday, Wednesday and Friday dialysis pt. Last dialysis was yesterday with no complications. She denies nausea, emesis, diaphoresis and CP as associated symptoms.  She also has a h/o anemia, CAD and HTN. Pt denies smoking and alcohol use.  PCP is Dr. Mirna Mires   Cardiologist is with [facility], has an appointment in 3 days.  Nephrologist Dr Fausto Skillern   Past Medical History  Diagnosis Date  . Anemia 03/06/2011  . Hypertension   . Coronary artery disease   . Renal disorder   . Aortic stenosis, mild     Past Surgical History  Procedure Laterality Date  . Av fistula placement      L arm  . Other surgical history Bilateral     Surgery on both arms.     History reviewed. No pertinent family history.  History  Substance Use Topics  . Smoking status: Never Smoker   . Smokeless tobacco: Not on file  . Alcohol Use: No  lives at home Lives with spouse  No OB history provided.  Review of Systems  Constitutional: Negative for diaphoresis.  Cardiovascular: Positive for palpitations. Negative for chest pain.  Gastrointestinal: Negative for nausea and vomiting.  Neurological: Positive for light-headedness.  All other systems reviewed and are negative.    Allergies  Review of patient's allergies indicates no known allergies.  Home Medications   Current Outpatient Rx  Name  Route  Sig  Dispense  Refill  . atorvastatin (LIPITOR) 40 MG tablet   Oral   Take 40 mg by mouth daily.           Marland Kitchen diltiazem (DILACOR XR) 120 MG 24 hr capsule   Oral   Take 120 mg by mouth daily.         Marland Kitchen labetalol (NORMODYNE) 200 MG tablet   Oral  Take 200 mg by mouth 2 (two) times daily.           . sevelamer carbonate (RENVELA) 800 MG tablet   Oral   Take 2,400 mg by mouth 3 (three) times daily with meals.         . sodium bicarbonate 650 MG tablet   Oral   Take 1,300 mg by mouth 2 (two) times daily.          . sorbitol 70 % solution   Oral   Take 30 mLs by mouth at bedtime as needed.          Patient has not taken any of her medications today. She states she normally takes her medications at 9:30 in the morning   Triage Vitals: BP 141/79  Pulse 153  Temp(Src) 97.9 F (36.6 C) (Oral)  Ht 5\' 4"  (1.626 m)  Wt 117 lb (53.071  kg)  BMI 20.07 kg/m2  SpO2 100%  Vital signs normal except for tachycardia   Physical Exam  Nursing note and vitals reviewed. Constitutional: She is oriented to person, place, and time. She appears well-developed and well-nourished.  Non-toxic appearance. She does not appear ill. No distress.  HENT:  Head: Normocephalic and atraumatic.  Right Ear: External ear normal.  Left Ear: External ear normal.  Nose: Nose normal. No mucosal edema or rhinorrhea.  Mouth/Throat: Oropharynx is clear and moist and mucous membranes are normal. No dental abscesses or edematous.  Eyes: Conjunctivae and EOM are normal. Pupils are equal, round, and reactive to light.  Pupils are very dilated bilaterally  Neck: Normal range of motion and full passive range of motion without pain. Neck supple.  Cardiovascular: Normal heart sounds.  An irregularly irregular rhythm present. Tachycardia present.  Exam reveals no gallop and no friction rub.   No murmur heard. Pulmonary/Chest: Effort normal and breath sounds normal. No respiratory distress. She has no wheezes. She has no rhonchi. She has no rales. She exhibits no tenderness and no crepitus.  Abdominal: Soft. Normal appearance and bowel sounds are normal. She exhibits no distension. There is no tenderness. There is no rebound and no guarding.  Musculoskeletal: Normal range of motion. She exhibits no edema and no tenderness.  Moves all extremities well.   Neurological: She is alert and oriented to person, place, and time. She has normal strength. No cranial nerve deficit.  Skin: Skin is warm, dry and intact. No rash noted. No erythema. No pallor.  Psychiatric: She has a normal mood and affect. Her speech is normal and behavior is normal. Her mood appears not anxious.    ED Course  Procedures (including critical care time)  Medications  diltiazem (CARDIZEM) 100 mg in dextrose 5 % 100 mL infusion (10 mg/hr Intravenous Rate/Dose Change 11/10/12 1151)  diltiazem  (CARDIZEM) injection 10 mg (0 mg Intravenous Stopped 11/10/12 1117)    DIAGNOSTIC STUDIES: Oxygen Saturation is 100% on room air, normal by my interpretation.    COORDINATION OF CARE: 11:07 AM-Discussed treatment plan which includes Cardizem, CXR, CBC panel, BMP and troponin with pt at bedside and pt agreed to plan.   11:27 AM- Pt rechecked and HR is 91, NSR with frequent PACs. Advised nurse to repeat EKG.  12:25 PM- Pt rechecked and has short runs of A. Fib with periods of NSR with frequent PAC's on cardiazem drip at 10 mg/hr. Discussed admission for A. Fib with pt and pt agreed.   12:48 Theodoro Kos for hospitalist, admit to step-down, Dr Lendell Caprice attending  Results for orders placed during the hospital encounter of 11/10/12  CBC WITH DIFFERENTIAL      Result Value Range   WBC 9.6  4.0 - 10.5 K/uL   RBC 4.15  3.87 - 5.11 MIL/uL   Hemoglobin 12.6  12.0 - 15.0 g/dL   HCT 40.9  81.1 - 91.4 %   MCV 90.8  78.0 - 100.0 fL   MCH 30.4  26.0 - 34.0 pg   MCHC 33.4  30.0 - 36.0 g/dL   RDW 78.2  95.6 - 21.3 %   Platelets 271  150 - 400 K/uL   Neutrophils Relative 46  43 - 77 %   Neutro Abs 4.4  1.7 - 7.7 K/uL   Lymphocytes Relative 39  12 - 46 %   Lymphs Abs 3.7  0.7 - 4.0 K/uL   Monocytes Relative 11  3 - 12 %   Monocytes Absolute 1.0  0.1 - 1.0 K/uL   Eosinophils Relative 4  0 - 5 %   Eosinophils Absolute 0.4  0.0 - 0.7 K/uL   Basophils Relative 1  0 - 1 %   Basophils Absolute 0.1  0.0 - 0.1 K/uL  BASIC METABOLIC PANEL      Result Value Range   Sodium 137  135 - 145 mEq/L   Potassium 4.0  3.5 - 5.1 mEq/L   Chloride 95 (*) 96 - 112 mEq/L   CO2 25  19 - 32 mEq/L   Glucose, Bld 88  70 - 99 mg/dL   BUN 43 (*) 6 - 23 mg/dL   Creatinine, Ser 0.86 (*) 0.50 - 1.10 mg/dL   Calcium 9.2  8.4 - 57.8 mg/dL   GFR calc non Af Amer 5 (*) >90 mL/min   GFR calc Af Amer 6 (*) >90 mL/min  TROPONIN I      Result Value Range   Troponin I <0.30  <0.30 ng/mL    Laboratory interpretation all  normal except known chronic renal disease    Dg Chest Portable 1 View  11/10/2012  *RADIOLOGY REPORT*  Clinical Data: Palpitations and tachycardia.  PORTABLE CHEST - 1 VIEW  Comparison: 10/14/2012 and prior chest radiographs dating back to 12/09/2006  Findings: Cardiomegaly is again identified. Fullness of the right superior mediastinum is unchanged and probably represents vasculature or enlarged thyroid. There is no evidence of focal airspace disease, pulmonary edema, suspicious pulmonary nodule/mass, pleural effusion, or pneumothorax. No acute bony abnormalities are identified.  IMPRESSION: Cardiomegaly without evidence of acute cardiopulmonary disease.   Original Report Authenticated By: Harmon Pier, [name]     #1  Date: 11/10/2012  Rate: 149  Rhythm: atrial fibrillation  QRS Axis: normal  Intervals: normal  ST/T Wave abnormalities: nonspecific ST changes  Conduction Disutrbances:none  Narrative Interpretation:   Old EKG Reviewed: changes noted from 10/14/2012 was in NSR HR 71   #2 while on cardiazem drip  Date: 11/10/2012  Rate: 97  Rhythm: normal sinus rhythm, premature atrial contractions (PAC) and brief runs of afib  QRS Axis: normal  Intervals: normal  ST/T Wave abnormalities: nonspecific ST changes  Conduction Disutrbances:none  Narrative Interpretation:   Old EKG Reviewed: changes noted from earlier in day    1. Atrial fibrillation with rapid ventricular response   2. Dyspnea   3. ESRD on dialysis       CRITICAL CARE Performed by: Gari Trovato L Total critical care time: 33 min Critical care time was exclusive of separately billable procedures and treating other patients. Critical care  was necessary to treat or prevent imminent or life-threatening deterioration. Critical care was time spent personally by me on the following activities: development of treatment plan with patient and/or surrogate as well as nursing, discussions with consultants, evaluation of patient's  response to treatment, examination of patient, obtaining history from patient or surrogate, ordering and performing treatments and interventions, ordering and review of laboratory studies, ordering and review of radiographic studies, pulse oximetry and re-evaluation of patient's condition.   MDM    I personally performed the services described in this documentation, which was scribed in my presence. The recorded information has been reviewed and considered.  Devoria Albe, MD, Armando Gang      Ward Givens, MD 11/10/12 [phone]

## 2012-11-10 NOTE — H&P (Signed)
[facility] History and Physical  [name] ZOX:[id number] DOB: 1927-11-05 DOA: 11/10/2012  Referring physician:  PCP: [name], MD  Specialists:   Chief Complaint: sob/palpitation  HPI: [name] is a delightful 79 y.o. female with past medical hx of CAD, AS (mild) ESRD MWF dialysis, HTN who presents to ED cc palpitations. Information obtained from patient and daughter who is at bedside. Pt states she was in usual state of health and at the eye doctor this am when she suddenly felt nauseated, dizzy and could feed "my heart racing". She reports she has episodes like this periodically and they usually pass quickly but this am it did not pass. Her daughter who was with her brought her to ED and she was found to be in afib RVR. Pt reports similar episode 10/14/12, she came to ED was given bolus of cardizem and according to chart converted back to SR. Pt observed for several hours and released. In 2012 pt admitted with same and converted to SR after 12 hours cardizem drip. Pt denies CP, headache, blurred vision. She denies any recent illness, fever, chills, anorexia, n/v/diarrhea or constipation. Denies dysuria, hematuria, melena. Symptoms came on suddenly have persisted and characterized as moderate. Work up in ED yields neg troponin, EKG with afib and RVR. Repeat EKG after cardizem bolus yields ST with PVC's. TRH asked to admit.   Review of Systems: The patient denies anorexia, fever, weight loss,, vision loss, decreased hearing, hoarseness, chest pain, syncope, dyspnea on exertion, peripheral edema, balance deficits, hemoptysis, abdominal pain, melena, hematochezia, severe indigestion/heartburn, hematuria, incontinence, genital sores, muscle weakness, suspicious skin lesions, transient blindness, difficulty walking, depression, unusual weight change, abnormal bleeding, enlarged lymph nodes, angioedema, and breast masses.    Past Medical History  Diagnosis Date  . Anemia 03/06/2011   . Hypertension   . Coronary artery disease   . Renal disorder   . Aortic stenosis, mild   . A-fib   . Acute CHF    Past Surgical History  Procedure Laterality Date  . Av fistula placement      L arm  . Other surgical history Bilateral     Surgery on both arms.    Social History:  reports that she has never smoked. She does not have any smokeless tobacco history on file. She reports that she does not drink alcohol or use illicit drugs. Lives at home with husband who is disabled and she cares for him.   No Known Allergies  History reviewed. No pertinent family history.  Prior to Admission medications   Medication Sig Start Date End Date Taking? Authorizing Provider  atorvastatin (LIPITOR) 40 MG tablet Take 40 mg by mouth daily.     Yes Historical Provider, MD  diltiazem (DILACOR XR) 120 MG 24 hr capsule Take 120 mg by mouth daily.   Yes Historical Provider, MD  labetalol (NORMODYNE) 200 MG tablet Take 200 mg by mouth 2 (two) times daily.     Yes Historical Provider, MD  sevelamer carbonate (RENVELA) 800 MG tablet Take 2,400 mg by mouth 3 (three) times daily with meals.   Yes Historical Provider, MD  sodium bicarbonate 650 MG tablet Take 1,300 mg by mouth 2 (two) times daily.    Yes Historical Provider, MD  sorbitol 70 % solution Take 30 mLs by mouth at bedtime as needed.   Yes Historical Provider, MD   Physical Exam: Filed Vitals:   11/10/12 1245 11/10/12 1300 11/10/12 1315 11/10/12 1330  BP: 118/78 145/76 126/80  137/60  Pulse: 77  91 103  Temp:      TempSrc:      Resp: 24 22 18 19   Height:      Weight:      SpO2: 100%  100% 100%     General:  Awake alert somewhat frail appearing  Eyes: PERRL EOMI   ENT: ears clear nose without drainage mucus membrane of mouth moist/pink  Neck: supple no JVD no lymphadenopathy  Cardiovascular: irregularly irregular, +murmur. No gallup no LE edema  Respiratory: normal effort BS clear bilaterally no wheeze no rhonchi  Abdomen:  flat soft +BS non-tender to palpation  Skin: warm dry no rash no lesion  Musculoskeletal: no joint swelling/erythema no clubbing no cyanosis  Psychiatric: calm cooperative   Neurologic: cranial nerve II-XII intact speech clear facial symmetry  Labs on Admission:  Basic Metabolic Panel:  Recent Labs Lab 11/10/12 1057  NA 137  K 4.0  CL 95*  CO2 25  GLUCOSE 88  BUN 43*  CREATININE 6.56*  CALCIUM 9.2   Liver Function Tests: No results found for this basename: AST, ALT, ALKPHOS, BILITOT, PROT, ALBUMIN,  in the last 168 hours No results found for this basename: LIPASE, AMYLASE,  in the last 168 hours No results found for this basename: AMMONIA,  in the last 168 hours CBC:  Recent Labs Lab 11/10/12 1057  WBC 9.6  NEUTROABS 4.4  HGB 12.6  HCT 37.7  MCV 90.8  PLT 271   Cardiac Enzymes:  Recent Labs Lab 11/10/12 1057  TROPONINI <0.30    BNP (last 3 results) No results found for this basename: PROBNP,  in the last 8760 hours CBG: No results found for this basename: GLUCAP,  in the last 168 hours  Radiological Exams on Admission: Dg Chest Portable 1 View  11/10/2012  *RADIOLOGY REPORT*  Clinical Data: Palpitations and tachycardia.  PORTABLE CHEST - 1 VIEW  Comparison: 10/14/2012 and prior chest radiographs dating back to 12/09/2006  Findings: Cardiomegaly is again identified. Fullness of the right superior mediastinum is unchanged and probably represents vasculature or enlarged thyroid. There is no evidence of focal airspace disease, pulmonary edema, suspicious pulmonary nodule/mass, pleural effusion, or pneumothorax. No acute bony abnormalities are identified.  IMPRESSION: Cardiomegaly without evidence of acute cardiopulmonary disease.   Original Report Authenticated By: Harmon Pier, [name]     EKG: Independently reviewed. ST with PVC's  Assessment/Plan Active Problems: A-fib with RVR: hx of same. Will admit to SD and continue cardizem drip started in ED. Will cycle  CE. Check TSH.  Echo 11/13 yields EF 60-65% with grade 1 diastolic dysfunction. Have requested cardiology consult.  CHADS2 score 2. May need anticoagulation.  ESRD (end stage renal disease) on dialysis: pt is MWF dialysis patient. Will request renal consult for dialysis  HTN (hypertension): controlled currently. Pt takes labetalol at home. Will hold for now. Monitor closely. See #1.     Anemia: likely of chronic disease. Currently appears to be at baseline at 12.         Aortic stenosis, mild: has yearly appointment with cardiologist this week.  Hyperlipidemia: will check lipid panel. Continue statin.          [facility]  Code Status: full Family Communication: daughter at bedside Disposition Plan: home when ready hopefully 1-2 days  Time spent: 39 minutes  Gwenyth Bender [facility] Pager [phone]  If 7PM-7AM, please contact night-coverage [email or web address] Password [facility] 11/10/2012, 1:59 PM   Attending note:  Patient interviewed and examined.  Now in NSR.  Will resume home meds and d/c cardizem gtt.  Has had palpitations on and off.  May require anticoagulation.  Will defer to cardiology.  Pt due to see [facility] this week, so will proceed with consult.  Crista Curb, [name]

## 2012-11-10 NOTE — ED Notes (Addendum)
Pt rate changed frm 150-160's to mid 90's will continue to increase to 120's at times. Rhythm is sinus with PAC"S and then will convert to a-fib.  Repeat ekg done.

## 2012-11-10 NOTE — ED Notes (Signed)
Floor unable to take report at present time, will return call when available.

## 2012-11-10 NOTE — Consult Note (Addendum)
[facility] Cardiology Consultation  [name]    [id number] [birth year]  Reason for Consult: Atrial Fibrillation  Requesting Physician: Dr. Lendell Caprice  Primary Cardiologist: Dr. Allyson Sabal  HPI: This is a 78 y.o. female with a past medical history significant for hypertension hyperlipidemia, and end-stage renal failure on hemodialysis on Monday Wednesday and Fridays. She has a history of paroxysmal atrial fibrillation which has been recurrent over the past several years. In addition, she has a history of moderate aortic valve stenosis and on echocardiography in November 2013 her mean aortic valve gradient was 13 mm in maximum gradient was 25 mm. Valve area was 1.15 cm square. Did have at least moderate pulmonary hypertension with a PA pressure of 55 mm. Apparently one month ago, the patient had an episode of atrial fibrillation after 12 hours of Cardizem converted to sinus rhythm. She apparently was sent home medications were not increased. Today, patient experienced another episode of rapid heart rate associated nausea and dizziness. She presented to anything emergency room and again was found in atrial fibrillation with a rapid ventricular response. She was treated with Cardizem bolus plus drip and was admitted by the hospitalist service. Apparently, subsequently, she again has converted back into sinus rhythm. She denies associated chest pressure. Cardiology consultation was requested.    Past Medical History  Diagnosis Date  . Anemia 03/06/2011  . Hypertension   . Coronary artery disease   . Renal disorder   . Aortic stenosis, mild   . A-fib   . Acute CHF    Past Surgical History  Procedure Laterality Date  . Av fistula placement      L arm  . Other surgical history Bilateral     Surgery on both arms.     FAMHx: History reviewed. No pertinent family history.  SOCHx:  reports that she has never smoked. She does not have any smokeless tobacco history  on file. She reports that she does not drink alcohol or use illicit drugs.  ALLERGIES: No Known Allergies  ROS:  A comprehensive review of systems was negative except for palpitations, nausea, mild shortness of breath associated with the rapid heart rate. She states she still does make some urine. She denies any recent caffeine use or over-the-counter pseudoephedrine preparations.   HOME MEDICATIONS: Prescriptions prior to admission  Medication Sig Dispense Refill  . atorvastatin (LIPITOR) 40 MG tablet Take 40 mg by mouth daily.        Marland Kitchen diltiazem (DILACOR XR) 120 MG 24 hr capsule Take 120 mg by mouth daily.      Marland Kitchen labetalol (NORMODYNE) 200 MG tablet Take 200 mg by mouth 2 (two) times daily.        . sevelamer carbonate (RENVELA) 800 MG tablet Take 2,400 mg by mouth 3 (three) times daily with meals.      . sodium bicarbonate 650 MG tablet Take 1,300 mg by mouth 2 (two) times daily.       . sorbitol 70 % solution Take 30 mLs by mouth at bedtime as needed.        HOSPITAL MEDICATIONS: Scheduled: . atorvastatin  40 mg Oral q1800  . [START ON 11/11/2012] diltiazem  120 mg Oral Daily  . heparin  5,000 Units Subcutaneous Q8H  . labetalol  200 mg Oral BID  . [START ON 11/11/2012] sevelamer carbonate  2,400 mg Oral TID WC  . sodium bicarbonate  1,300 mg Oral BID  . sodium chloride  3 mL Intravenous Q12H  VITALS: Blood pressure 128/47, pulse 73, temperature 98.2 F (36.8 C), temperature source Oral, resp. rate 18, height 5\' 4"  (1.626 m), weight 52.4 kg (115 lb 8.3 oz), SpO2 100.00%.  PHYSICAL EXAM: General appearance: alert, cooperative and no distress Neck: no adenopathy, no JVD, supple, symmetrical, trachea midline and thyroid not enlarged, symmetric, no tenderness/mass/nodules Lungs: clear to auscultation bilaterally Heart: regular rate and rhythm and 2/6 sem Abdomen: soft, non-tender; bowel sounds normal; no masses,  no organomegaly Extremities: no edema, redness or tenderness in  the calves or thighs Skin: Skin color, texture, turgor normal. No rashes or lesions Neurologic: Grossly normal  LABS: Results for orders placed during the hospital encounter of 11/10/12 (from the past 48 hour(s))  CBC WITH DIFFERENTIAL     Status: None   Collection Time    11/10/12 10:57 AM      Result Value Range   WBC 9.6  4.0 - 10.5 K/uL   RBC 4.15  3.87 - 5.11 MIL/uL   Hemoglobin 12.6  12.0 - 15.0 g/dL   HCT 30.8  65.7 - 84.6 %   MCV 90.8  78.0 - 100.0 fL   MCH 30.4  26.0 - 34.0 pg   MCHC 33.4  30.0 - 36.0 g/dL   RDW 96.2  95.2 - 84.1 %   Platelets 271  150 - 400 K/uL   Neutrophils Relative 46  43 - 77 %   Neutro Abs 4.4  1.7 - 7.7 K/uL   Lymphocytes Relative 39  12 - 46 %   Lymphs Abs 3.7  0.7 - 4.0 K/uL   Monocytes Relative 11  3 - 12 %   Monocytes Absolute 1.0  0.1 - 1.0 K/uL   Eosinophils Relative 4  0 - 5 %   Eosinophils Absolute 0.4  0.0 - 0.7 K/uL   Basophils Relative 1  0 - 1 %   Basophils Absolute 0.1  0.0 - 0.1 K/uL  BASIC METABOLIC PANEL     Status: Abnormal   Collection Time    11/10/12 10:57 AM      Result Value Range   Sodium 137  135 - 145 mEq/L   Potassium 4.0  3.5 - 5.1 mEq/L   Chloride 95 (*) 96 - 112 mEq/L   CO2 25  19 - 32 mEq/L   Glucose, Bld 88  70 - 99 mg/dL   BUN 43 (*) 6 - 23 mg/dL   Creatinine, Ser 3.24 (*) 0.50 - 1.10 mg/dL   Calcium 9.2  8.4 - 40.1 mg/dL   GFR calc non Af Amer 5 (*) >90 mL/min   GFR calc Af Amer 6 (*) >90 mL/min   Comment:            The eGFR has been calculated     using the CKD EPI equation.     This calculation has not been     validated in all clinical     situations.     eGFR's persistently     <90 mL/min signify     possible Chronic Kidney Disease.  TROPONIN I     Status: None   Collection Time    11/10/12 10:57 AM      Result Value Range   Troponin I <0.30  <0.30 ng/mL   Comment:            Due to the release kinetics of cTnI,     a negative result within the first hours     of the onset  of symptoms  does not rule out     myocardial infarction with certainty.     If myocardial infarction is still suspected,     repeat the test at appropriate intervals.  LIPID PANEL     Status: Abnormal   Collection Time    11/10/12 11:00 AM      Result Value Range   Cholesterol 220 (*) 0 - 200 mg/dL   Triglycerides 865  <784 mg/dL   HDL 95  >69 mg/dL   Total CHOL/HDL Ratio 2.3     VLDL 24  0 - 40 mg/dL   LDL Cholesterol 629 (*) 0 - 99 mg/dL   Comment:            Total Cholesterol/HDL:CHD Risk     Coronary Heart Disease Risk Table                         Men   Women      1/2 Average Risk   3.4   3.3      Average Risk       5.0   4.4      2 X Average Risk   9.6   7.1      3 X Average Risk  23.4   11.0                Use the calculated Patient Ratio     above and the CHD Risk Table     to determine the patient's CHD Risk.                ATP III CLASSIFICATION (LDL):      <100     mg/dL   Optimal      528-413  mg/dL   Near or Above                        Optimal      130-159  mg/dL   Borderline      244-010  mg/dL   High      >272     mg/dL   Very High    IMAGING: Dg Chest Portable 1 View  11/10/2012  *RADIOLOGY REPORT*  Clinical Data: Palpitations and tachycardia.  PORTABLE CHEST - 1 VIEW  Comparison: 10/14/2012 and prior chest radiographs dating back to 12/09/2006  Findings: Cardiomegaly is again identified. Fullness of the right superior mediastinum is unchanged and probably represents vasculature or enlarged thyroid. There is no evidence of focal airspace disease, pulmonary edema, suspicious pulmonary nodule/mass, pleural effusion, or pneumothorax. No acute bony abnormalities are identified.  IMPRESSION: Cardiomegaly without evidence of acute cardiopulmonary disease.   Original Report Authenticated By: Harmon Pier, [name]     IMPRESSION: 1 atrial fibrillation with rapid ventricular response, with subsequent conversion to sinus rhythm 2 hypertension 3 end-stage renal disease on  hemodialysis 4 hyperlipidemia  RECOMMENDATION:  At present, [name] has converted back into sinus rhythm after experiencing recurrent fibrillation with rapid ventricular response. She is currently on a Cardizem drip with plans to institute oral Cardizem CD 120 mg. With her recurrent episodes of paroxysmal atrial fibrillation I do feel anticoagulation is indicated to reduce potential for thromboembolic risk. She is on dialysis and as a result not be able to use the newer agents, will therefore initiate Coumadin therapy. Agree with cycling cardiac enzymes, assessing thyroid function, and will reassess echocardiographic evaluation. Will have pharmacy dose Coumadin. Colleagues to see  tomorrow.  Time Spent Directly with Patient: 50 minutes Attending:  Lennette Bihari, MD, [facility] 11/10/2012 5:26 PM

## 2012-11-10 NOTE — ED Notes (Signed)
Pt states that she was at the eye doctor this morning she felt "her heart running away" from her.

## 2012-11-10 NOTE — ED Notes (Signed)
Pt and family updated on plan of care  

## 2012-11-10 NOTE — ED Notes (Addendum)
Pt presents to er with c/o "fast heartbeat" pt states that she started feeling her heart race while she was at the eye doctor's office this am, has had this happen twice, pt admits to sob, daughter states that pt had become "sweaty, hot", denies any n/v or pain. Pt hr 150-160 on monitor on arrival to tx room, rhythm is irregular.

## 2012-11-11 LAB — PHOSPHORUS: Phosphorus: 7.3 mg/dL — ABNORMAL HIGH (ref 2.3–4.6)

## 2012-11-11 LAB — COMPREHENSIVE METABOLIC PANEL
ALT: 5 U/L (ref 0–35)
AST: 13 U/L (ref 0–37)
Albumin: 3.3 g/dL — ABNORMAL LOW (ref 3.5–5.2)
Calcium: 8.6 mg/dL (ref 8.4–10.5)
Sodium: 138 mEq/L (ref 135–145)
Total Protein: 6.5 g/dL (ref 6.0–8.3)

## 2012-11-11 LAB — PROTIME-INR: Prothrombin Time: 14.7 seconds (ref 11.6–15.2)

## 2012-11-11 LAB — TSH: TSH: 3.447 u[IU]/mL (ref 0.350–4.500)

## 2012-11-11 MED ORDER — LIDOCAINE-PRILOCAINE 2.5-2.5 % EX CREA
1.0000 "application " | TOPICAL_CREAM | CUTANEOUS | Status: DC | PRN
  Filled 2012-11-11: qty 5

## 2012-11-11 MED ORDER — ALTEPLASE 2 MG IJ SOLR
2.0000 mg | Freq: Once | INTRAMUSCULAR | Status: AC | PRN
  Filled 2012-11-11: qty 2

## 2012-11-11 MED ORDER — HEPARIN SODIUM (PORCINE) 1000 UNIT/ML DIALYSIS
1000.0000 [IU] | INTRAMUSCULAR | Status: DC | PRN
  Filled 2012-11-11: qty 1

## 2012-11-11 MED ORDER — HEPARIN SODIUM (PORCINE) 1000 UNIT/ML DIALYSIS
20.0000 [IU]/kg | INTRAMUSCULAR | Status: DC | PRN
  Filled 2012-11-11: qty 1

## 2012-11-11 MED ORDER — WARFARIN VIDEO
Freq: Once | Status: AC
  Administered 2012-11-11: 12:00:00

## 2012-11-11 MED ORDER — LIDOCAINE HCL (PF) 1 % IJ SOLN: 5.0000 mL | INTRAMUSCULAR | Status: DC | PRN

## 2012-11-11 MED ORDER — SODIUM CHLORIDE 0.9 % IV SOLN: 100.0000 mL | INTRAVENOUS | Status: DC | PRN

## 2012-11-11 MED ORDER — PENTAFLUOROPROP-TETRAFLUOROETH EX AERO
1.0000 "application " | INHALATION_SPRAY | CUTANEOUS | Status: DC | PRN
  Filled 2012-11-11: qty 103.5

## 2012-11-11 MED ORDER — NEPRO/CARBSTEADY PO LIQD: 237.0000 mL | ORAL | Status: DC | PRN

## 2012-11-11 MED ORDER — HEPARIN SODIUM (PORCINE) 1000 UNIT/ML DIALYSIS
300.0000 [IU] | INTRAMUSCULAR | Status: DC | PRN
  Filled 2012-11-11: qty 1

## 2012-11-11 MED ORDER — PATIENT'S GUIDE TO USING COUMADIN BOOK
Freq: Once | Status: AC
  Administered 2012-11-11: 12:00:00
  Filled 2012-11-11: qty 1

## 2012-11-11 MED ORDER — WARFARIN - PHARMACIST DOSING INPATIENT: Status: DC

## 2012-11-11 MED ORDER — WARFARIN SODIUM 5 MG PO TABS
5.0000 mg | ORAL_TABLET | Freq: Once | ORAL | Status: AC
  Administered 2012-11-11: 5 mg via ORAL
  Filled 2012-11-11: qty 1

## 2012-11-11 NOTE — Progress Notes (Signed)
Pt given Coumadin book, and pt watched the provided video on blood thinners. Following video and book, more information gone over with patient using teach back method. All questions and concerns gone over with patient and family member that is present at bedside.

## 2012-11-11 NOTE — Progress Notes (Signed)
ANTICOAGULATION CONSULT NOTE - Initial Consult  Pharmacy Consult for Coumadin Indication: atrial fibrillation  No Known Allergies  Patient Measurements: Height: 5\' 4"  (162.6 cm) Weight: 123 lb 10.9 oz (56.1 kg) IBW/kg (Calculated) : 54.7  Vital Signs: Temp: 97.8 F (36.6 C) (05/07 0800) Temp src: Oral (05/07 0800) BP: 140/51 mmHg (05/07 0837) Pulse Rate: 65 (05/07 0837)  Labs:  Recent Labs  11/10/12 1057 11/10/12 1912 11/11/12 0430  HGB 12.6  --   --   HCT 37.7  --   --   PLT 271  --   --   LABPROT  --  13.8 14.7  INR  --  1.07 1.17  CREATININE 6.56*  --  7.91*  TROPONINI <0.30 <0.30  --     Estimated Creatinine Clearance: 4.7 ml/min (by C-G formula based on Cr of 7.91).  Medical History: Past Medical History  Diagnosis Date  . Anemia 03/06/2011  . Hypertension   . Coronary artery disease   . Renal disorder   . Aortic stenosis, mild   . A-fib   . Acute CHF    Medications:  Scheduled:  . atorvastatin  40 mg Oral q1800  . diltiazem  120 mg Oral Daily  . [COMPLETED] diltiazem  10 mg Intravenous Once  . heparin  5,000 Units Subcutaneous Q8H  . labetalol  200 mg Oral BID  . sevelamer carbonate  2,400 mg Oral TID WC  . sodium bicarbonate  1,300 mg Oral BID  . sodium chloride  3 mL Intravenous Q12H  . [COMPLETED] warfarin  5 mg Oral Once    Assessment: 79yo female to be started on Coumadin for new onset afib.  Coumadin 5mg  was given last pm.  INR at baseline.  Pt has ESRD requiring dialysis therefore not a candidate for newer novel agents.       Goal of Therapy:  INR 2-3 Monitor platelets by anticoagulation protocol: Yes   Plan:  Coumadin 5mg  today x 1 INR daily Monitor CBC  Kyrah Schiro A 11/11/2012,10:48 AM

## 2012-11-11 NOTE — Procedures (Signed)
    HEMODIALYSIS TREATMENT NOTE:  3 hour HD completed via left upper arm AVF (16g buttonhole needles / antegrade).  Tolerated removal of 1.7 liters with 20 minutes of interrupted UF time due to SBP<90 (asymptomatic).  All blood was reinfused.  Hemostasis achieved within 13 minutes.  Report given to Tory Emerald, RN

## 2012-11-11 NOTE — Progress Notes (Signed)
*  PRELIMINARY RESULTS* Echocardiogram 2D Echocardiogram has been performed.  [name] 11/11/2012, 8:59 PM Veda Canning, RDCS

## 2012-11-11 NOTE — Progress Notes (Signed)
     Subjective: This lady feels much improved and is in sinus rhythm. She does not have any complaints. Serial cardiac enzymes are negative.           Physical Exam: Blood pressure 114/45, pulse 73, temperature 98 F (36.7 C), temperature source Oral, resp. rate 17, height 5\' 4"  (1.626 m), weight 56.1 kg (123 lb 10.9 oz), SpO2 100.00%. She looks systemically well. Heart sounds are present with an aortic systolic murmur, in sinus rhythm. Lung fields are clear. Jugular venous pressure not raised. She is alert and orientated.   Investigations:  Recent Results (from the past 240 hour(s))  MRSA PCR SCREENING     Status: None   Collection Time    11/10/12  2:40 PM      Result Value Range Status   MRSA by PCR NEGATIVE  NEGATIVE Final   Comment:            The GeneXpert MRSA Assay (FDA     approved for NASAL specimens     only), is one component of a     comprehensive MRSA colonization     surveillance program. It is not     intended to diagnose MRSA     infection nor to guide or     monitor treatment for     MRSA infections.     Basic Metabolic Panel:  Recent Labs  16/10/96 1057 11/11/12 0430  NA 137 138  K 4.0 4.4  CL 95* 97  CO2 25 25  GLUCOSE 88 84  BUN 43* 54*  CREATININE 6.56* 7.91*  CALCIUM 9.2 8.6  PHOS  --  7.3*   Liver Function Tests:  Recent Labs  11/11/12 0430  AST 13  ALT 5  ALKPHOS 55  BILITOT 0.4  PROT 6.5  ALBUMIN 3.3*     CBC:  Recent Labs  11/10/12 1057  WBC 9.6  NEUTROABS 4.4  HGB 12.6  HCT 37.7  MCV 90.8  PLT 271    Dg Chest Portable 1 View  11/10/2012  *RADIOLOGY REPORT*  Clinical Data: Palpitations and tachycardia.  PORTABLE CHEST - 1 VIEW  Comparison: 10/14/2012 and prior chest radiographs dating back to 12/09/2006  Findings: Cardiomegaly is again identified. Fullness of the right superior mediastinum is unchanged and probably represents vasculature or enlarged thyroid. There is no evidence of focal airspace disease,  pulmonary edema, suspicious pulmonary nodule/mass, pleural effusion, or pneumothorax. No acute bony abnormalities are identified.  IMPRESSION: Cardiomegaly without evidence of acute cardiopulmonary disease.   Original Report Authenticated By: Harmon Pier, [name]       Medications: I have reviewed the patient's current medications.  Impression: 1. Atrial fibrillation with rapid ventricular response, now in sinus rhythm. 2. End-stage renal disease, on hemodialysis, due for dialysis today. 3. Hypertension. 4. Mild aortic stenosis.      Plan: 1. Continue with oral anticoagulation with warfarin. 2. Await echocardiogram. 3. Possible discharge home today, await cardiology recommendations .     LOS: 1 day   Wilson Singer Pager [phone]  11/11/2012, 8:02 AM

## 2012-11-11 NOTE — Progress Notes (Signed)
[name]  MRN: [id number]  DOB/AGE: 77-Sep-1929 77 y.o.  Primary Care Physician:HILL,GERALD K, MD  Admit date: 11/10/2012  Chief Complaint:  Chief Complaint  Patient presents with  . Tachycardia    S-Pt presented on  11/10/2012 with  Chief Complaint  Patient presents with  . Tachycardia  .    Pt today feels better.    Pt says he heart was running away and is now much better  meds  . atorvastatin  40 mg Oral q1800  . diltiazem  120 mg Oral Daily  . heparin  5,000 Units Subcutaneous Q8H  . labetalol  200 mg Oral BID  . sevelamer carbonate  2,400 mg Oral TID WC  . sodium bicarbonate  1,300 mg Oral BID  . sodium chloride  3 mL Intravenous Q12H        Physical Exam: Vital signs in last 24 hours: Temp:  [97.9 F (36.6 C)-98.2 F (36.8 C)] 97.9 F (36.6 C) (05/06 2315) Pulse Rate:  [73-153] 73 (05/06 1430) Resp:  [15-24] 17 (05/07 0300) BP: (95-167)/(40-94) 114/45 mmHg (05/07 0300) SpO2:  [100 %] 100 % (05/06 1430) Weight:  [115 lb 8.3 oz (52.4 kg)-117 lb (53.071 kg)] 115 lb 8.3 oz (52.4 kg) (05/06 1500) Weight change:  Last BM Date: 11/09/12  Intake/Output from previous day: 05/06 0701 - 05/07 0700 In: 273.7 [P.O.:220; I.V.:53.7] Out: 150 [Urine:150] Total I/O In: -  Out: 150 [Urine:150]   Physical Exam: General- pt is awake,alert, oriented to time place and person Resp- No acute REsp distress, CTA B/L NO Rhonchi CVS- S1S2 regular in rate and rhythm GIT- BS+, soft, NT, ND EXT- NO LE Edema, Cyanosis Access- left AVf + Bruit + thrill   Lab Results: CBC  Recent Labs  11/10/12 1057  WBC 9.6  HGB 12.6  HCT 37.7  PLT 271    BMET  Recent Labs  11/10/12 1057  NA 137  K 4.0  CL 95*  CO2 25  GLUCOSE 88  BUN 43*  CREATININE 6.56*  CALCIUM 9.2    MICRO Recent Results (from the past 240 hour(s))  MRSA PCR SCREENING     Status: None   Collection Time    11/10/12  2:40 PM      Result Value Range Status   MRSA by PCR NEGATIVE  NEGATIVE  Final   Comment:            The GeneXpert MRSA Assay (FDA     approved for NASAL specimens     only), is one component of a     comprehensive MRSA colonization     surveillance program. It is not     intended to diagnose MRSA     infection nor to guide or     monitor treatment for     MRSA infections.      Lab Results  Component Value Date   PTH 399.0* 02/26/2011   CALCIUM 9.2 11/10/2012   CAION 1.10* 06/09/2011   PHOS 2.8 06/10/2011               Impression: 1)Renal  ESRD on HD On MWF schedule Pt is to be dialyzed today.  2)HTN Target Organ damage  CKD CAD  Medication- On Calcium Channel Blockers On Alpha and beta Blockers  3)Anemia HGb at goal (9--11)   4)CKD Mineral-Bone Disorder PTH acceptable. Secondary Hyperparathyroidism  Present. Phosphorus at goal.   5)Afib- Cardiology following Now much better  6)FEN  Normokalemic NOrmonatremic   7)Acid  base Co2 at goal     Plan:  Will dialyze today. Will follow bmet.       [name] S 11/11/2012, 4:46 AM

## 2012-11-11 NOTE — Progress Notes (Signed)
UR Chart Review Completed  

## 2012-11-12 ENCOUNTER — Encounter (HOSPITAL_COMMUNITY): Admission: RE | Admit: 2012-11-12 | Payer: PRIVATE HEALTH INSURANCE | Source: Ambulatory Visit

## 2012-11-12 MED ORDER — WARFARIN SODIUM 5 MG PO TABS: 5.0000 mg | ORAL_TABLET | Freq: Once | ORAL | Status: DC

## 2012-11-12 MED ORDER — WARFARIN SODIUM 6 MG PO TABS: 6.0000 mg | ORAL_TABLET | Freq: Once | ORAL | Status: DC

## 2012-11-12 NOTE — Progress Notes (Signed)
Subjective: Interval History: has no complaint of palpitation. Patient denies any difficulty breathing. Overall she's doing good..  Objective: Vital signs in last 24 hours: Temp:  [97.7 F (36.5 C)-99.4 F (37.4 C)] 99 F (37.2 C) (05/08 0400) Pulse Rate:  [57-79] 72 (05/07 2217) Resp:  [12-22] 19 (05/08 0600) BP: (89-162)/(39-60) 134/48 mmHg (05/08 0600) SpO2:  [98 %-100 %] 100 % (05/07 1352) Weight:  [52.2 kg (115 lb 1.3 oz)-53.8 kg (118 lb 9.7 oz)] 53.4 kg (117 lb 11.6 oz) (05/08 0500) Weight change: 0.729 kg (1 lb 9.7 oz)  Intake/Output from previous day: 05/07 0701 - 05/08 0700 In: 650 [P.O.:650] Out: 1876 [Urine:125] Intake/Output this shift:    General appearance: alert, cooperative and no distress Resp: clear to auscultation bilaterally Cardio: regular rate and rhythm, S1, S2 normal, no murmur, click, rub or gallop GI: soft, non-tender; bowel sounds normal; no masses,  no organomegaly Extremities: extremities normal, atraumatic, no cyanosis or edema  Lab Results:  Recent Labs  11/10/12 1057  WBC 9.6  HGB 12.6  HCT 37.7  PLT 271   BMET:  Recent Labs  11/10/12 1057 11/11/12 0430  NA 137 138  K 4.0 4.4  CL 95* 97  CO2 25 25  GLUCOSE 88 84  BUN 43* 54*  CREATININE 6.56* 7.91*  CALCIUM 9.2 8.6   No results found for this basename: PTH,  in the last 72 hours Iron Studies: No results found for this basename: IRON, TIBC, TRANSFERRIN, FERRITIN,  in the last 72 hours  Studies/Results: Dg Chest Portable 1 View  11/10/2012  *RADIOLOGY REPORT*  Clinical Data: Palpitations and tachycardia.  PORTABLE CHEST - 1 VIEW  Comparison: 10/14/2012 and prior chest radiographs dating back to 12/09/2006  Findings: Cardiomegaly is again identified. Fullness of the right superior mediastinum is unchanged and probably represents vasculature or enlarged thyroid. There is no evidence of focal airspace disease, pulmonary edema, suspicious pulmonary nodule/mass, pleural effusion, or  pneumothorax. No acute bony abnormalities are identified.  IMPRESSION: Cardiomegaly without evidence of acute cardiopulmonary disease.   Original Report Authenticated By: Harmon Pier, [name]     I have reviewed the patient's current medications.  Assessment/Plan: Problem #1 end-stage renal disease she status post hemodialysis yesterday BUN was 54 creatinine 7.91 potassium 4.4 stable. Problem #2 after fibrillation presently patient is an and rhythm Problem #3 hypertension blood pressure is reasonably controlled Problem #4 anemia her hemoglobin and hematocrit is good patient is off Epogen Problem #5 metabolic bone disease calcium was in acceptable range and phosphorus is high patient restarted on her binder. Problem #6 mild aortic stenosis she is asymptomatic.  Plan: We'll make arrangements for patient to get dialysis tomorrow. We'll hold heparin as patient is on Coumadin. Check her basic metabolic panel CBC and phosphorus in the morning.   LOS: 2 days   Lashone Stauber S 11/12/2012,7:55 AM

## 2012-11-12 NOTE — Progress Notes (Signed)
Discharge instructions given to patient and family.  Script for coumadin also given to patient.  Patient verbalized understanding of discharge instructions.  No acute distress noted.  Patient out via w/c with writer to awaiting vehicle.  No acute distress noted.

## 2012-11-12 NOTE — Discharge Summary (Signed)
Physician Discharge Summary  [name] ZOX:[id number] DOB: 10-Jan-1928 DOA: 11/10/2012  PCP: [name], MD  Admit date: 11/10/2012 Discharge date: 11/12/2012  Time spent: Greater than 30 minutes  Recommendations for Outpatient Follow-up:  1. Follow with cardiology today in the outpatient setting.  Discharge Diagnoses:  1. Atrial fibrillation with rapid ventricular response, converted to sinus rhythm. Start anticoagulation with warfarin. Echocardiogram result pending. 2. End stage renal disease on hemodialysis, ongoing. 3. Mild aortic stenosis. 4. Hypertension.   Discharge Condition: Stable and improved.  Diet recommendation: Renal diet.  Filed Weights   11/11/12 1030 11/11/12 1413 11/12/12 0500  Weight: 53.8 kg (118 lb 9.7 oz) 52.2 kg (115 lb 1.3 oz) 53.4 kg (117 lb 11.6 oz)    History of present illness:  This very pleasant 79 year old lady presented to the hospital with symptoms of dyspnea and palpitations. Please see initial history as outlined below: HPI: [name] is a delightful 79 y.o. female with past medical hx of CAD, AS (mild) ESRD MWF dialysis, HTN who presents to ED cc palpitations. Information obtained from patient and daughter who is at bedside. Pt states she was in usual state of health and at the eye doctor this am when she suddenly felt nauseated, dizzy and could feed "my heart racing". She reports she has episodes like this periodically and they usually pass quickly but this am it did not pass. Her daughter who was with her brought her to ED and she was found to be in afib RVR. Pt reports similar episode 10/14/12, she came to ED was given bolus of cardizem and according to chart converted back to SR. Pt observed for several hours and released. In 2012 pt admitted with same and converted to SR after 12 hours cardizem drip. Pt denies CP, headache, blurred vision. She denies any recent illness, fever, chills, anorexia, n/v/diarrhea or constipation. Denies dysuria,  hematuria, melena. Symptoms came on suddenly have persisted and characterized as moderate. Work up in ED yields neg troponin, EKG with afib and RVR. Repeat EKG after cardizem bolus yields ST with PVC's. TRH asked to admit.  Hospital Course:  Patient is a mid and started on Cardizem drip. She  soon converted to sinus rhythm. In view of several presentations of atrial fibrillation in the past and this one now, cardiology felt that it was appropriate to start her on anticoagulation. She has been started on warfarin. She underwent hemodialysis under the care of Dr Fausto Skillern, nephrology while she was in the hospital without any complications. She has remained in sinus rhythm and is doing well. She did undergo echocardiogram, result is pending. Her INR is now quite therapeutic yet but I think it is safe to discharge her with an increased dose and this can be monitored closely as an outpatient.  Procedures:  Echocardiogram, result pending.   Consultations:  Cardiology, Dr. Tresa Endo.  Discharge Exam: Filed Vitals:   11/12/12 0300 11/12/12 0400 11/12/12 0500 11/12/12 0600  BP: 140/52 137/57 126/39 134/48  Pulse:      Temp:  99 F (37.2 C)    TempSrc:  Oral    Resp: 17 19 17 19   Height:      Weight:   53.4 kg (117 lb 11.6 oz)   SpO2:        General: She looks systemically well. Cardiovascular: Heart sounds are present and in sinus rhythm. There is a systolic murmur, consistent with aortic stenosis. No evidence of congestive heart failure. Respiratory: Lung fields are clear. She  is alert and orientated.  Discharge Instructions  Discharge Orders   Future Appointments Provider Department Dept Phone   12/09/2012 8:00 AM [facility] Danella Penton [facility] [phone]   Future Orders Complete By Expires     Diet - low sodium heart healthy  As directed     Increase activity slowly  As directed         Medication List    TAKE these medications       atorvastatin 40 MG tablet   Commonly known as:  LIPITOR  Take 40 mg by mouth daily.     diltiazem 120 MG 24 hr capsule  Commonly known as:  DILACOR XR  Take 120 mg by mouth daily.     labetalol 200 MG tablet  Commonly known as:  NORMODYNE  Take 200 mg by mouth 2 (two) times daily.     sevelamer carbonate 800 MG tablet  Commonly known as:  RENVELA  Take 2,400 mg by mouth 3 (three) times daily with meals.     sodium bicarbonate 650 MG tablet  Take 1,300 mg by mouth 2 (two) times daily.     sorbitol 70 % solution  Take 30 mLs by mouth at bedtime as needed.     warfarin 6 MG tablet  Commonly known as:  COUMADIN  Take 1 tablet (6 mg total) by mouth once.       No Known Allergies    The results of significant diagnostics from this hospitalization (including imaging, microbiology, ancillary and laboratory) are listed below for reference.    Significant Diagnostic Studies: Dg Chest Portable 1 View  11/10/2012  *RADIOLOGY REPORT*  Clinical Data: Palpitations and tachycardia.  PORTABLE CHEST - 1 VIEW  Comparison: 10/14/2012 and prior chest radiographs dating back to 12/09/2006  Findings: Cardiomegaly is again identified. Fullness of the right superior mediastinum is unchanged and probably represents vasculature or enlarged thyroid. There is no evidence of focal airspace disease, pulmonary edema, suspicious pulmonary nodule/mass, pleural effusion, or pneumothorax. No acute bony abnormalities are identified.  IMPRESSION: Cardiomegaly without evidence of acute cardiopulmonary disease.   Original Report Authenticated By: Harmon Pier, [name]    Dg Chest Portable 1 View  10/14/2012  *RADIOLOGY REPORT*  Clinical Data: Irregular heart rate.  PORTABLE CHEST - 1 VIEW  Comparison: 07/04/2011.  Findings: Trachea is midline.  Heart is enlarged.  Lungs are clear. No pleural fluid.  IMPRESSION: No acute findings.   Original Report Authenticated By: Leanna Battles, [name]     Microbiology: Recent Results (from the past 240 hour(s))   MRSA PCR SCREENING     Status: None   Collection Time    11/10/12  2:40 PM      Result Value Range Status   MRSA by PCR NEGATIVE  NEGATIVE Final   Comment:            The GeneXpert MRSA Assay (FDA     approved for NASAL specimens     only), is one component of a     comprehensive MRSA colonization     surveillance program. It is not     intended to diagnose MRSA     infection nor to guide or     monitor treatment for     MRSA infections.     Labs: Basic Metabolic Panel:  Recent Labs Lab 11/10/12 1057 11/11/12 0430  NA 137 138  K 4.0 4.4  CL 95* 97  CO2 25 25  GLUCOSE 88 84  BUN  43* 54*  CREATININE 6.56* 7.91*  CALCIUM 9.2 8.6  PHOS  --  7.3*   Liver Function Tests:  Recent Labs Lab 11/11/12 0430  AST 13  ALT 5  ALKPHOS 55  BILITOT 0.4  PROT 6.5  ALBUMIN 3.3*     CBC:  Recent Labs Lab 11/10/12 1057  WBC 9.6  NEUTROABS 4.4  HGB 12.6  HCT 37.7  MCV 90.8  PLT 271   Cardiac Enzymes:  Recent Labs Lab 11/10/12 1057 11/10/12 1912  TROPONINI <0.30 <0.30         Signed:  Sia Gabrielsen C  [facility] 11/12/2012, 8:30 AM

## 2012-11-12 NOTE — Progress Notes (Signed)
ANTICOAGULATION CONSULT NOTE   Pharmacy Consult for Coumadin Indication: atrial fibrillation  No Known Allergies  Patient Measurements: Height: 5\' 4"  (162.6 cm) Weight: 117 lb 11.6 oz (53.4 kg) IBW/kg (Calculated) : 54.7  Vital Signs: Temp: 99 F (37.2 C) (05/08 0400) Temp src: Oral (05/08 0400) BP: 134/48 mmHg (05/08 0600) Pulse Rate: 72 (05/07 2217)  Labs:  Recent Labs  11/10/12 1057 11/10/12 1912 11/11/12 0430 11/12/12 0448  HGB 12.6  --   --   --   HCT 37.7  --   --   --   PLT 271  --   --   --   LABPROT  --  13.8 14.7 15.3*  INR  --  1.07 1.17 1.23  CREATININE 6.56*  --  7.91*  --   TROPONINI <0.30 <0.30  --   --     Estimated Creatinine Clearance: 4.6 ml/min (by C-G formula based on Cr of 7.91).  Medical History: Past Medical History  Diagnosis Date  . Anemia 03/06/2011  . Hypertension   . Coronary artery disease   . Renal disorder   . Aortic stenosis, mild   . A-fib   . Acute CHF    Medications:  Scheduled:  . atorvastatin  40 mg Oral q1800  . diltiazem  120 mg Oral Daily  . heparin  5,000 Units Subcutaneous Q8H  . labetalol  200 mg Oral BID  . [COMPLETED] patient's guide to using coumadin book   Does not apply Once  . sevelamer carbonate  2,400 mg Oral TID WC  . sodium bicarbonate  1,300 mg Oral BID  . sodium chloride  3 mL Intravenous Q12H  . [COMPLETED] warfarin  5 mg Oral Once  . warfarin  5 mg Oral Once  . [COMPLETED] warfarin   Does not apply Once  . Warfarin - Pharmacist Dosing Inpatient   Does not apply Q24H   Assessment: 79yo female to be started on Coumadin for new onset afib.   INR is starting to trend up.  Pt has ESRD requiring dialysis therefore not a candidate for newer novel agents.        Goal of Therapy:  INR 2-3 Monitor platelets by anticoagulation protocol: Yes   Plan:  Coumadin 5mg  today x 1 INR daily Monitor CBC Coumadin education  Valrie Hart A 11/12/2012,7:55 AM

## 2012-11-17 ENCOUNTER — Ambulatory Visit (HOSPITAL_COMMUNITY)
Admission: RE | Admit: 2012-11-17 | Payer: PRIVATE HEALTH INSURANCE | Source: Ambulatory Visit | Admitting: Ophthalmology

## 2012-11-17 ENCOUNTER — Encounter (HOSPITAL_COMMUNITY): Admission: RE | Payer: Self-pay | Source: Ambulatory Visit

## 2012-11-17 SURGERY — PHACOEMULSIFICATION, CATARACT, WITH IOL INSERTION
Anesthesia: Monitor Anesthesia Care | Laterality: Right

## 2012-11-19 ENCOUNTER — Ambulatory Visit (INDEPENDENT_AMBULATORY_CARE_PROVIDER_SITE_OTHER): Payer: Self-pay | Admitting: Pharmacist Clinician (PhC)/ Clinical Pharmacy Specialist

## 2012-11-19 ENCOUNTER — Other Ambulatory Visit: Payer: Self-pay | Admitting: Cardiovascular Disease

## 2012-11-19 DIAGNOSIS — Z7901 Long term (current) use of anticoagulants: Secondary | ICD-10-CM

## 2012-11-19 DIAGNOSIS — I4891 Unspecified atrial fibrillation: Secondary | ICD-10-CM

## 2012-11-19 LAB — APTT: aPTT: 57 seconds — ABNORMAL HIGH (ref 24–37)

## 2012-11-19 LAB — PROTIME-INR
INR: 3.86 — ABNORMAL HIGH (ref <1.50)
Prothrombin Time: 35.6 seconds — ABNORMAL HIGH (ref 11.6–15.2)

## 2012-11-27 LAB — POCT INR: INR: 5.33

## 2012-12-01 ENCOUNTER — Telehealth: Payer: Self-pay | Admitting: Cardiovascular Disease

## 2012-12-01 DIAGNOSIS — Z7901 Long term (current) use of anticoagulants: Secondary | ICD-10-CM

## 2012-12-01 DIAGNOSIS — I4891 Unspecified atrial fibrillation: Secondary | ICD-10-CM

## 2012-12-01 NOTE — Telephone Encounter (Signed)
LMOM 4:30pm for pt - need to know what INR level was, when drawn.

## 2012-12-03 ENCOUNTER — Ambulatory Visit: Payer: Self-pay | Admitting: Pharmacist Clinician (PhC)/ Clinical Pharmacy Specialist

## 2012-12-03 ENCOUNTER — Other Ambulatory Visit: Payer: Self-pay | Admitting: Pharmacist Clinician (PhC)/ Clinical Pharmacy Specialist

## 2012-12-03 DIAGNOSIS — I4891 Unspecified atrial fibrillation: Secondary | ICD-10-CM

## 2012-12-03 DIAGNOSIS — Z7901 Long term (current) use of anticoagulants: Secondary | ICD-10-CM

## 2012-12-03 NOTE — Telephone Encounter (Signed)
INR last Friday at Dr. Mirna Mires [facility] [place]) was 5.33.  Pt has not taken any warfarin since Friday.  Dialysis will not draw INR levels, so will send pt to lab for draw next week.

## 2012-12-06 DEATH — deceased

## 2012-12-09 ENCOUNTER — Other Ambulatory Visit (HOSPITAL_COMMUNITY): Payer: PRIVATE HEALTH INSURANCE

## 2012-12-15 ENCOUNTER — Encounter (HOSPITAL_COMMUNITY): Admission: RE | Payer: Self-pay | Source: Ambulatory Visit

## 2012-12-15 ENCOUNTER — Ambulatory Visit (HOSPITAL_COMMUNITY)
Admission: RE | Admit: 2012-12-15 | Payer: PRIVATE HEALTH INSURANCE | Source: Ambulatory Visit | Admitting: Ophthalmology

## 2012-12-15 SURGERY — PHACOEMULSIFICATION, CATARACT, WITH IOL INSERTION
Anesthesia: Monitor Anesthesia Care | Laterality: Left

## 2012-12-24 ENCOUNTER — Ambulatory Visit (INDEPENDENT_AMBULATORY_CARE_PROVIDER_SITE_OTHER): Payer: Self-pay | Admitting: Pharmacist Clinician (PhC)/ Clinical Pharmacy Specialist

## 2012-12-24 ENCOUNTER — Other Ambulatory Visit: Payer: Self-pay | Admitting: Pharmacist Clinician (PhC)/ Clinical Pharmacy Specialist

## 2012-12-24 ENCOUNTER — Telehealth: Payer: Self-pay | Admitting: Cardiovascular Disease

## 2012-12-24 DIAGNOSIS — Z7901 Long term (current) use of anticoagulants: Secondary | ICD-10-CM

## 2012-12-24 DIAGNOSIS — I4891 Unspecified atrial fibrillation: Secondary | ICD-10-CM

## 2012-12-24 LAB — PROTIME-INR
INR: 1.61 — ABNORMAL HIGH (ref <1.50)
Prothrombin Time: 18.8 seconds — ABNORMAL HIGH (ref 11.6–15.2)

## 2012-12-24 MED ORDER — WARFARIN SODIUM 6 MG PO TABS: ORAL_TABLET | ORAL | Status: DC

## 2013-01-07 ENCOUNTER — Telehealth: Payer: Self-pay | Admitting: Cardiovascular Disease

## 2013-01-07 DIAGNOSIS — Z7901 Long term (current) use of anticoagulants: Secondary | ICD-10-CM

## 2013-01-07 DIAGNOSIS — I4891 Unspecified atrial fibrillation: Secondary | ICD-10-CM

## 2013-01-07 NOTE — Telephone Encounter (Signed)
Pt is there-waiting for her lab order!

## 2013-01-07 NOTE — Telephone Encounter (Signed)
Returned call from lab.  Stated pt there for labs and no order.  Spoke w/ K. Alvstad, PharmD and pt needs PT/INR.  Order released.  Call back to lab and informed pt left, but may come back.

## 2013-01-11 ENCOUNTER — Ambulatory Visit (INDEPENDENT_AMBULATORY_CARE_PROVIDER_SITE_OTHER): Payer: PRIVATE HEALTH INSURANCE | Admitting: Pharmacist Clinician (PhC)/ Clinical Pharmacy Specialist

## 2013-01-11 DIAGNOSIS — Z7901 Long term (current) use of anticoagulants: Secondary | ICD-10-CM

## 2013-01-11 DIAGNOSIS — I4891 Unspecified atrial fibrillation: Secondary | ICD-10-CM

## 2013-01-19 ENCOUNTER — Other Ambulatory Visit: Payer: Self-pay | Admitting: Pharmacist Clinician (PhC)/ Clinical Pharmacy Specialist

## 2013-01-20 ENCOUNTER — Ambulatory Visit (INDEPENDENT_AMBULATORY_CARE_PROVIDER_SITE_OTHER): Payer: Self-pay | Admitting: Pharmacist Clinician (PhC)/ Clinical Pharmacy Specialist

## 2013-01-20 DIAGNOSIS — I4891 Unspecified atrial fibrillation: Secondary | ICD-10-CM

## 2013-01-20 DIAGNOSIS — Z7901 Long term (current) use of anticoagulants: Secondary | ICD-10-CM

## 2013-01-20 LAB — PROTIME-INR
INR: 1.97 — ABNORMAL HIGH (ref <1.50)
Prothrombin Time: 21.8 seconds — ABNORMAL HIGH (ref 11.6–15.2)

## 2013-02-15 ENCOUNTER — Other Ambulatory Visit: Payer: Self-pay | Admitting: Cardiovascular Disease

## 2013-02-15 LAB — PROTIME-INR: Prothrombin Time: 20 seconds — ABNORMAL HIGH (ref 11.6–15.2)

## 2013-02-17 ENCOUNTER — Ambulatory Visit (INDEPENDENT_AMBULATORY_CARE_PROVIDER_SITE_OTHER): Payer: Self-pay | Admitting: Pharmacist Clinician (PhC)/ Clinical Pharmacy Specialist

## 2013-02-17 DIAGNOSIS — I4891 Unspecified atrial fibrillation: Secondary | ICD-10-CM

## 2013-02-17 DIAGNOSIS — Z7901 Long term (current) use of anticoagulants: Secondary | ICD-10-CM

## 2013-03-09 ENCOUNTER — Other Ambulatory Visit: Payer: Self-pay | Admitting: Cardiovascular Disease

## 2013-03-10 ENCOUNTER — Ambulatory Visit (INDEPENDENT_AMBULATORY_CARE_PROVIDER_SITE_OTHER): Payer: Medicaid Other | Admitting: Pharmacist Clinician (PhC)/ Clinical Pharmacy Specialist

## 2013-03-10 DIAGNOSIS — I4891 Unspecified atrial fibrillation: Secondary | ICD-10-CM

## 2013-03-10 DIAGNOSIS — Z7901 Long term (current) use of anticoagulants: Secondary | ICD-10-CM

## 2013-04-05 ENCOUNTER — Ambulatory Visit (INDEPENDENT_AMBULATORY_CARE_PROVIDER_SITE_OTHER): Payer: PRIVATE HEALTH INSURANCE | Admitting: Pharmacist Clinician (PhC)/ Clinical Pharmacy Specialist

## 2013-04-05 ENCOUNTER — Other Ambulatory Visit: Payer: Self-pay | Admitting: Cardiovascular Disease

## 2013-04-05 DIAGNOSIS — Z7901 Long term (current) use of anticoagulants: Secondary | ICD-10-CM

## 2013-04-05 DIAGNOSIS — I4891 Unspecified atrial fibrillation: Secondary | ICD-10-CM

## 2013-04-20 ENCOUNTER — Emergency Department (HOSPITAL_COMMUNITY)
Admission: EM | Admit: 2013-04-20 | Discharge: 2013-04-20 | Disposition: A | Discharge status: Home / Self Care | Payer: PRIVATE HEALTH INSURANCE | Attending: Emergency Medicine | Admitting: Emergency Medicine

## 2013-04-20 ENCOUNTER — Other Ambulatory Visit: Payer: Self-pay

## 2013-04-20 ENCOUNTER — Encounter (HOSPITAL_COMMUNITY): Payer: Self-pay | Admitting: Emergency Medicine

## 2013-04-20 ENCOUNTER — Emergency Department (HOSPITAL_COMMUNITY): Payer: PRIVATE HEALTH INSURANCE

## 2013-04-20 DIAGNOSIS — Z862 Personal history of diseases of the blood and blood-forming organs and certain disorders involving the immune mechanism: Secondary | ICD-10-CM | POA: Insufficient documentation

## 2013-04-20 DIAGNOSIS — Z7901 Long term (current) use of anticoagulants: Secondary | ICD-10-CM | POA: Insufficient documentation

## 2013-04-20 DIAGNOSIS — R0602 Shortness of breath: Secondary | ICD-10-CM | POA: Insufficient documentation

## 2013-04-20 DIAGNOSIS — R5381 Other malaise: Secondary | ICD-10-CM | POA: Diagnosis not present

## 2013-04-20 DIAGNOSIS — Z87448 Personal history of other diseases of urinary system: Secondary | ICD-10-CM | POA: Diagnosis not present

## 2013-04-20 DIAGNOSIS — I4891 Unspecified atrial fibrillation: Secondary | ICD-10-CM | POA: Insufficient documentation

## 2013-04-20 DIAGNOSIS — R002 Palpitations: Secondary | ICD-10-CM | POA: Insufficient documentation

## 2013-04-20 DIAGNOSIS — R011 Cardiac murmur, unspecified: Secondary | ICD-10-CM | POA: Diagnosis not present

## 2013-04-20 DIAGNOSIS — I509 Heart failure, unspecified: Secondary | ICD-10-CM | POA: Diagnosis not present

## 2013-04-20 DIAGNOSIS — Z79899 Other long term (current) drug therapy: Secondary | ICD-10-CM | POA: Diagnosis not present

## 2013-04-20 DIAGNOSIS — I2510 Atherosclerotic heart disease of native coronary artery without angina pectoris: Secondary | ICD-10-CM | POA: Diagnosis not present

## 2013-04-20 DIAGNOSIS — I10 Essential (primary) hypertension: Secondary | ICD-10-CM | POA: Diagnosis not present

## 2013-04-20 LAB — CBC WITH DIFFERENTIAL/PLATELET
Eosinophils Absolute: 0.4 10*3/uL (ref 0.0–0.7)
Eosinophils Relative: 6 % — ABNORMAL HIGH (ref 0–5)
HCT: 39.8 % (ref 36.0–46.0)
Hemoglobin: 12.9 g/dL (ref 12.0–15.0)
Lymphs Abs: 3.1 10*3/uL (ref 0.7–4.0)
MCH: 29.6 pg (ref 26.0–34.0)
MCV: 91.3 fL (ref 78.0–100.0)
Monocytes Relative: 10 % (ref 3–12)
Neutrophils Relative %: 43 % (ref 43–77)
RBC: 4.36 MIL/uL (ref 3.87–5.11)

## 2013-04-20 LAB — BASIC METABOLIC PANEL
BUN: 47 mg/dL — ABNORMAL HIGH (ref 6–23)
GFR calc non Af Amer: 5 mL/min — ABNORMAL LOW (ref >90)
Glucose, Bld: 145 mg/dL — ABNORMAL HIGH (ref 70–99)
Potassium: 3.7 mEq/L (ref 3.5–5.1)

## 2013-04-20 LAB — PROTIME-INR: INR: 2.17 — ABNORMAL HIGH (ref 0.00–1.49)

## 2013-04-20 NOTE — ED Provider Notes (Signed)
CSN: [id number]     Arrival date & time 04/20/13  1259 History  This chart was scribed for Gokul Waybright B. Bernette Mayers, MD by Quintella Reichert, ED scribe.  This patient was seen in room APA14/APA14 and the patient's care was started at 1:50 PM.  Chief Complaint  Patient presents with  . Palpitations    The history is provided by the patient. No language interpreter was used.    HPI Comments: [name] is a 79 y.o. female with h/o A-fib, CHF and CAD who presents complaining of a sudden-onset episode of heart palpitations that began one hour ago, with associated SOB and weakness.  Pt states her heart is "racing."  She denies CP.  Her symptoms began while she was visiting her husband who is also a patient in the ED.  Pt did not take any of her medications this morning.  She normally takes labetalol each morning and ditiazem each night.  She admits to prior h/o similar episodes brought on by emotional stress or physical exertion.  She has been hospitalized for afib RVR previously and states her symptoms were similar on that occasion as at present.  Pt is scheduled to receive dialysis tomorrow.  She has never had a pacemaker in place.    Past Medical History  Diagnosis Date  . Anemia 03/06/2011  . Hypertension   . Coronary artery disease   . Renal disorder   . Aortic stenosis, mild   . A-fib   . Acute CHF     Past Surgical History  Procedure Laterality Date  . Av fistula placement      L arm  . Other surgical history Bilateral     Surgery on both arms.     No family history on file.   History  Substance Use Topics  . Smoking status: Never Smoker   . Smokeless tobacco: Not on file  . Alcohol Use: No    OB History   Grav Para Term Preterm Abortions TAB SAB Ect Mult Living                  Review of Systems A complete 10 system review of systems was obtained and all systems are negative except as noted in the HPI and PMH.    Allergies  Review of patient's allergies  indicates no known allergies.  Home Medications   Current Outpatient Rx  Name  Route  Sig  Dispense  Refill  . atorvastatin (LIPITOR) 40 MG tablet   Oral   Take 40 mg by mouth daily.           Marland Kitchen diltiazem (DILACOR XR) 120 MG 24 hr capsule   Oral   Take 120 mg by mouth daily.         Marland Kitchen labetalol (NORMODYNE) 200 MG tablet   Oral   Take 200 mg by mouth 2 (two) times daily.           . sevelamer carbonate (RENVELA) 800 MG tablet   Oral   Take 2,400 mg by mouth 3 (three) times daily with meals.         . sodium bicarbonate 650 MG tablet   Oral   Take 1,300 mg by mouth 2 (two) times daily.          . sorbitol 70 % solution   Oral   Take 30 mLs by mouth at bedtime as needed.         . warfarin (COUMADIN) 6 MG tablet  Take 1 tablet by mouth daily or as directed.   30 tablet   6    Pulse 112  Resp 17  SpO2 100%  Physical Exam  Nursing note and vitals reviewed. Constitutional: She is oriented to person, place, and time. She appears well-developed and well-nourished.  HENT:  Head: Normocephalic and atraumatic.  Eyes: EOM are normal. Pupils are equal, round, and reactive to light.  Neck: Normal range of motion. Neck supple.  Cardiovascular: Intact distal pulses.  An irregular rhythm present.  Murmur heard. Pulmonary/Chest: Effort normal and breath sounds normal.  Abdominal: Bowel sounds are normal. She exhibits no distension. There is no tenderness.  Musculoskeletal: Normal range of motion. She exhibits no edema and no tenderness.  Neurological: She is alert and oriented to person, place, and time. She has normal strength. No cranial nerve deficit or sensory deficit.  Skin: Skin is warm and dry. No rash noted.  Psychiatric: She has a normal mood and affect.    ED Course  Procedures (including critical care time)  DIAGNOSTIC STUDIES: Oxygen Saturation is 100% on room air, normal by my interpretation.    COORDINATION OF CARE: 1:56 PM-Discussed  treatment plan which includes EKG, CXR, cardiac monitoring and labs with pt at bedside and pt agreed to plan.    Labs Review Labs Reviewed  CBC WITH DIFFERENTIAL - Abnormal; Notable for the following:    Eosinophils Relative 6 (*)    All other components within normal limits  BASIC METABOLIC PANEL - Abnormal; Notable for the following:    Chloride 94 (*)    Glucose, Bld 145 (*)    BUN 47 (*)    Creatinine, Ser 7.32 (*)    GFR calc non Af Amer 5 (*)    GFR calc Af Amer 5 (*)    All other components within normal limits  PROTIME-INR - Abnormal; Notable for the following:    Prothrombin Time 23.5 (*)    INR 2.17 (*)    All other components within normal limits  TROPONIN I    Imaging Review Dg Chest 2 View  04/20/2013   CLINICAL DATA:  Chest pain and shortness of breath.  EXAM: CHEST  2 VIEW  COMPARISON:  Single view of the chest 11/10/2012 and PA and lateral chest 06/18/2011.  FINDINGS: There is cardiomegaly without edema. The chest appears hyperexpanded. No focal airspace disease or effusion. Compression fracture deformity upper thoracic spine is unchanged.  IMPRESSION: Cardiomegaly without acute disease.   Electronically Signed   By: Drusilla Kanner [name]   On: 04/20/2013 13:42     Date: 04/20/2013  Rate: 111  Rhythm: sinus tachycardia  QRS Axis: normal  Intervals: normal  ST/T Wave abnormalities: nonspecific ST/T changes  Conduction Disutrbances:none  Narrative Interpretation: LVH  Old EKG Reviewed: unchanged from previous EKG at similar rate    MDM   1. Palpitations     EKG looks like sinus, however patient has had occasional episodes of afib, rate well controlled in the ED. Did not have any chest pain, doubt this is ACS. She is therapeutic on coumadin. Suspect she had a self-limited episode of afib RVR brought on by emotional stress as well as not taking her meds this morning. Labs and imaging reviewed and unremarkable. She has been asymptomatic since moved into a room  and placed on monitor. Able to walk around the nurses station without symptoms. I do not feel that she needs to be admitted at this point. She would prefer to be  with her husband. Encouraged to take her evening meds as directed and to follow up with PCP and cardiology as needed. Return to the ED for any return of symptoms or for any other concern.     I personally performed the services described in this documentation, which was scribed in my presence. The recorded information has been reviewed and is accurate.      Jacklin Zwick B. Bernette Mayers, MD 04/20/13 [phone]

## 2013-04-20 NOTE — ED Notes (Signed)
Pt here visiting her husband and began to have cp and dizziness.  Pt reports being a dialysis pt.  Pt has hx of afib.

## 2013-04-20 NOTE — ED Notes (Signed)
Pt ambulated around nurses station, pt stated she felt fine while walking. Dr. Bernette Mayers is aware.

## 2013-04-21 LAB — POCT I-STAT TROPONIN I: Troponin i, poc: 0.05 ng/mL (ref 0.00–0.08)

## 2013-04-26 ENCOUNTER — Ambulatory Visit (INDEPENDENT_AMBULATORY_CARE_PROVIDER_SITE_OTHER): Payer: PRIVATE HEALTH INSURANCE | Admitting: *Deleted

## 2013-04-26 DIAGNOSIS — I4891 Unspecified atrial fibrillation: Secondary | ICD-10-CM

## 2013-04-26 DIAGNOSIS — Z7901 Long term (current) use of anticoagulants: Secondary | ICD-10-CM

## 2013-04-26 LAB — POCT INR: INR: 1.8

## 2013-04-29 ENCOUNTER — Telehealth: Payer: Self-pay | Admitting: *Deleted

## 2013-04-29 NOTE — Telephone Encounter (Signed)
Returned call.  Pt stated she was in the ER last Tuesday and they did an EKG and X-Ray.  Pt wanted to know if her doctor can look at them and prescribe her something for the fluttering.  Also asked if she can have the spray that goes under the tongue like her husband.  Pt informed the spray under the tongue is likely NTG and that is not used for fluttering, but CP/pressure.  RN reviewed meds and pt confirmed she is taking diltiazem 120 mg daily.  Denied taking labetalol regularly.  Stated she has missed a few doses of that.  Pt advised to be sure to take labetalol twice daily as prescribed to help w/ irregular heartbeats in addition to diltiazem daily and if no improvement in 24-48 hrs to call back, even if after hours, for further instructions.  Pt verbalized understanding and agreed w/ plan.  Message forwarded to Dr. Allyson Sabal [facility]).

## 2013-04-29 NOTE — Telephone Encounter (Signed)
Pt was calling in regards to her heart fluttering. They did an EKG and she wants to know if he can prescribe her some medication.

## 2013-05-05 ENCOUNTER — Emergency Department (HOSPITAL_COMMUNITY)
Admission: EM | Admit: 2013-05-05 | Discharge: 2013-05-06 | Disposition: A | Discharge status: Home / Self Care | Payer: Medicare Other | Source: Home / Self Care | Attending: Emergency Medicine | Admitting: Emergency Medicine

## 2013-05-05 ENCOUNTER — Encounter (HOSPITAL_COMMUNITY): Payer: Self-pay | Admitting: Emergency Medicine

## 2013-05-05 DIAGNOSIS — D631 Anemia in chronic kidney disease: Secondary | ICD-10-CM | POA: Diagnosis present

## 2013-05-05 DIAGNOSIS — G929 Unspecified toxic encephalopathy: Secondary | ICD-10-CM | POA: Diagnosis present

## 2013-05-05 DIAGNOSIS — E785 Hyperlipidemia, unspecified: Secondary | ICD-10-CM | POA: Diagnosis present

## 2013-05-05 DIAGNOSIS — Z79899 Other long term (current) drug therapy: Secondary | ICD-10-CM | POA: Insufficient documentation

## 2013-05-05 DIAGNOSIS — S42412A Displaced simple supracondylar fracture without intercondylar fracture of left humerus, initial encounter for closed fracture: Secondary | ICD-10-CM

## 2013-05-05 DIAGNOSIS — Z7901 Long term (current) use of anticoagulants: Secondary | ICD-10-CM | POA: Insufficient documentation

## 2013-05-05 DIAGNOSIS — Z862 Personal history of diseases of the blood and blood-forming organs and certain disorders involving the immune mechanism: Secondary | ICD-10-CM | POA: Insufficient documentation

## 2013-05-05 DIAGNOSIS — I4891 Unspecified atrial fibrillation: Secondary | ICD-10-CM | POA: Diagnosis present

## 2013-05-05 DIAGNOSIS — R791 Abnormal coagulation profile: Secondary | ICD-10-CM | POA: Diagnosis not present

## 2013-05-05 DIAGNOSIS — Z992 Dependence on renal dialysis: Secondary | ICD-10-CM

## 2013-05-05 DIAGNOSIS — R4181 Age-related cognitive decline: Secondary | ICD-10-CM | POA: Diagnosis present

## 2013-05-05 DIAGNOSIS — I509 Heart failure, unspecified: Secondary | ICD-10-CM | POA: Insufficient documentation

## 2013-05-05 DIAGNOSIS — I10 Essential (primary) hypertension: Secondary | ICD-10-CM | POA: Insufficient documentation

## 2013-05-05 DIAGNOSIS — A498 Other bacterial infections of unspecified site: Secondary | ICD-10-CM | POA: Diagnosis present

## 2013-05-05 DIAGNOSIS — Y9289 Other specified places as the place of occurrence of the external cause: Secondary | ICD-10-CM | POA: Insufficient documentation

## 2013-05-05 DIAGNOSIS — R000 Tachycardia, unspecified: Secondary | ICD-10-CM | POA: Diagnosis not present

## 2013-05-05 DIAGNOSIS — I359 Nonrheumatic aortic valve disorder, unspecified: Secondary | ICD-10-CM | POA: Diagnosis present

## 2013-05-05 DIAGNOSIS — D509 Iron deficiency anemia, unspecified: Secondary | ICD-10-CM | POA: Diagnosis present

## 2013-05-05 DIAGNOSIS — N186 End stage renal disease: Secondary | ICD-10-CM | POA: Diagnosis present

## 2013-05-05 DIAGNOSIS — Y9389 Activity, other specified: Secondary | ICD-10-CM | POA: Insufficient documentation

## 2013-05-05 DIAGNOSIS — I5032 Chronic diastolic (congestive) heart failure: Secondary | ICD-10-CM | POA: Diagnosis present

## 2013-05-05 DIAGNOSIS — E876 Hypokalemia: Secondary | ICD-10-CM | POA: Diagnosis not present

## 2013-05-05 DIAGNOSIS — T45515A Adverse effect of anticoagulants, initial encounter: Secondary | ICD-10-CM | POA: Diagnosis not present

## 2013-05-05 DIAGNOSIS — I2510 Atherosclerotic heart disease of native coronary artery without angina pectoris: Secondary | ICD-10-CM | POA: Insufficient documentation

## 2013-05-05 DIAGNOSIS — Z87448 Personal history of other diseases of urinary system: Secondary | ICD-10-CM | POA: Insufficient documentation

## 2013-05-05 DIAGNOSIS — Z9181 History of falling: Secondary | ICD-10-CM

## 2013-05-05 DIAGNOSIS — N2581 Secondary hyperparathyroidism of renal origin: Secondary | ICD-10-CM | POA: Diagnosis present

## 2013-05-05 DIAGNOSIS — Y92009 Unspecified place in unspecified non-institutional (private) residence as the place of occurrence of the external cause: Secondary | ICD-10-CM

## 2013-05-05 DIAGNOSIS — I214 Non-ST elevation (NSTEMI) myocardial infarction: Principal | ICD-10-CM | POA: Diagnosis present

## 2013-05-05 DIAGNOSIS — E46 Unspecified protein-calorie malnutrition: Secondary | ICD-10-CM | POA: Diagnosis present

## 2013-05-05 DIAGNOSIS — I120 Hypertensive chronic kidney disease with stage 5 chronic kidney disease or end stage renal disease: Secondary | ICD-10-CM | POA: Diagnosis present

## 2013-05-05 DIAGNOSIS — T4275XA Adverse effect of unspecified antiepileptic and sedative-hypnotic drugs, initial encounter: Secondary | ICD-10-CM | POA: Diagnosis present

## 2013-05-05 DIAGNOSIS — G92 Toxic encephalopathy: Secondary | ICD-10-CM | POA: Diagnosis present

## 2013-05-05 DIAGNOSIS — S42413A Displaced simple supracondylar fracture without intercondylar fracture of unspecified humerus, initial encounter for closed fracture: Secondary | ICD-10-CM | POA: Diagnosis present

## 2013-05-05 DIAGNOSIS — W1809XA Striking against other object with subsequent fall, initial encounter: Secondary | ICD-10-CM | POA: Insufficient documentation

## 2013-05-05 DIAGNOSIS — N390 Urinary tract infection, site not specified: Secondary | ICD-10-CM | POA: Diagnosis present

## 2013-05-05 MED ORDER — MORPHINE SULFATE 4 MG/ML IJ SOLN
4.0000 mg | INTRAMUSCULAR | Status: DC | PRN
  Administered 2013-05-05 – 2013-05-06 (×2): 4 mg via INTRAVENOUS
  Filled 2013-05-05 (×2): qty 1

## 2013-05-05 NOTE — ED Notes (Signed)
Pt fell over tote. Complaining of left upper arm pain. Denies LOC

## 2013-05-05 NOTE — ED Notes (Signed)
Pt remains on backboard. Awaiting MD to removed c-collar and backboard

## 2013-05-06 ENCOUNTER — Emergency Department (HOSPITAL_COMMUNITY)
Admission: EM | Admit: 2013-05-06 | Discharge: 2013-05-07 | Disposition: A | Discharge status: Home / Self Care | Payer: Medicare Other | Source: Home / Self Care | Attending: Emergency Medicine | Admitting: Emergency Medicine

## 2013-05-06 ENCOUNTER — Emergency Department (HOSPITAL_COMMUNITY): Payer: Medicare Other

## 2013-05-06 ENCOUNTER — Encounter (HOSPITAL_COMMUNITY): Payer: Self-pay | Admitting: Emergency Medicine

## 2013-05-06 DIAGNOSIS — Z79899 Other long term (current) drug therapy: Secondary | ICD-10-CM | POA: Insufficient documentation

## 2013-05-06 DIAGNOSIS — I4891 Unspecified atrial fibrillation: Secondary | ICD-10-CM | POA: Insufficient documentation

## 2013-05-06 DIAGNOSIS — Z7901 Long term (current) use of anticoagulants: Secondary | ICD-10-CM | POA: Insufficient documentation

## 2013-05-06 DIAGNOSIS — Z862 Personal history of diseases of the blood and blood-forming organs and certain disorders involving the immune mechanism: Secondary | ICD-10-CM | POA: Insufficient documentation

## 2013-05-06 DIAGNOSIS — R011 Cardiac murmur, unspecified: Secondary | ICD-10-CM | POA: Insufficient documentation

## 2013-05-06 DIAGNOSIS — I10 Essential (primary) hypertension: Secondary | ICD-10-CM | POA: Insufficient documentation

## 2013-05-06 DIAGNOSIS — G8911 Acute pain due to trauma: Secondary | ICD-10-CM | POA: Insufficient documentation

## 2013-05-06 DIAGNOSIS — I509 Heart failure, unspecified: Secondary | ICD-10-CM | POA: Insufficient documentation

## 2013-05-06 DIAGNOSIS — M549 Dorsalgia, unspecified: Secondary | ICD-10-CM | POA: Insufficient documentation

## 2013-05-06 DIAGNOSIS — M25529 Pain in unspecified elbow: Secondary | ICD-10-CM | POA: Insufficient documentation

## 2013-05-06 DIAGNOSIS — I2510 Atherosclerotic heart disease of native coronary artery without angina pectoris: Secondary | ICD-10-CM | POA: Insufficient documentation

## 2013-05-06 DIAGNOSIS — R079 Chest pain, unspecified: Secondary | ICD-10-CM | POA: Insufficient documentation

## 2013-05-06 DIAGNOSIS — S42402S Unspecified fracture of lower end of left humerus, sequela: Secondary | ICD-10-CM

## 2013-05-06 DIAGNOSIS — Z87448 Personal history of other diseases of urinary system: Secondary | ICD-10-CM | POA: Insufficient documentation

## 2013-05-06 MED ORDER — OXYCODONE-ACETAMINOPHEN 5-325 MG PO TABS
2.0000 | ORAL_TABLET | Freq: Once | ORAL | Status: AC
  Administered 2013-05-06: 2 via ORAL
  Filled 2013-05-06: qty 2

## 2013-05-06 MED ORDER — ONDANSETRON 4 MG PO TBDP
4.0000 mg | ORAL_TABLET | Freq: Once | ORAL | Status: AC
  Administered 2013-05-06: 4 mg via ORAL
  Filled 2013-05-06: qty 1

## 2013-05-06 MED ORDER — OXYCODONE-ACETAMINOPHEN 5-325 MG PO TABS: 1.0000 | ORAL_TABLET | ORAL | Status: DC | PRN

## 2013-05-06 NOTE — ED Notes (Signed)
Placed patient on 2L O2 via nasal canula. O2 saturation increased to 100%. Patient denies being on home O2 but EMS stated patient was on oxygen upon arrival to home.

## 2013-05-06 NOTE — ED Provider Notes (Signed)
CSN: [id number]     Arrival date & time 05/05/13  2324 History   First MD Initiated Contact with Patient 05/05/13 2328     Chief Complaint  Patient presents with  . Fall    HPI Patient reports falling over her back this evening and complains of pain to her left upper extremity.  She focuses her pain around her distal left humerus.  She denies weakness or numbness of her left hand.  She reports her pain is moderate to severe in severity at this time.  Her pain in her left upper extremity is worsened by movement and palpation of her arm.  She denies chest pain.  She denies head injury.  She denies headache or loss of consciousness.  No neck pain.  No weakness of her upper lower extremities.  She denies back pain Back pain. she denies pain in her bilateral hips.  She is on Coumadin.   Past Medical History  Diagnosis Date  . Anemia 03/06/2011  . Hypertension   . Coronary artery disease   . Renal disorder   . Aortic stenosis, mild   . A-fib   . Acute CHF    Past Surgical History  Procedure Laterality Date  . Av fistula placement      L arm  . Other surgical history Bilateral     Surgery on both arms.   . Fracture surgery  Bil. Arms   No family history on file. History  Substance Use Topics  . Smoking status: Never Smoker   . Smokeless tobacco: Never Used  . Alcohol Use: No   OB History   Grav Para Term Preterm Abortions TAB SAB Ect Mult Living                 Review of Systems  All other systems reviewed and are negative.    Allergies  Review of patient's allergies indicates no known allergies.  Home Medications   Current Outpatient Rx  Name  Route  Sig  Dispense  Refill  . atorvastatin (LIPITOR) 40 MG tablet   Oral   Take 40 mg by mouth daily.           Marland Kitchen diltiazem (DILACOR XR) 120 MG 24 hr capsule   Oral   Take 120 mg by mouth daily.         Marland Kitchen labetalol (NORMODYNE) 200 MG tablet   Oral   Take 200 mg by mouth 2 (two) times daily.           Marland Kitchen  oxyCODONE-acetaminophen (PERCOCET/ROXICET) 5-325 MG per tablet   Oral   Take 1 tablet by mouth every 4 (four) hours as needed for pain.   30 tablet   0   . sevelamer carbonate (RENVELA) 800 MG tablet   Oral   Take 2,400 mg by mouth 3 (three) times daily with meals.         . sodium bicarbonate 650 MG tablet   Oral   Take 1,300 mg by mouth 2 (two) times daily.          . sorbitol 70 % solution   Oral   Take 30 mLs by mouth at bedtime as needed.         . warfarin (COUMADIN) 6 MG tablet   Oral   Take 3-6 mg by mouth See admin instructions. Take one tablet by mouth daily. Take one-half (3mg ) tablet on FRIDAYS          BP 153/65  Pulse 85  Temp(Src) 98 F (36.7 C) (Oral)  Resp 18  Ht 5\' 4"  (1.626 m)  Wt 117 lb (53.071 kg)  BMI 20.07 kg/m2  SpO2 99% Physical Exam  Nursing note and vitals reviewed. Constitutional: She is oriented to person, place, and time. She appears well-developed and well-nourished. No distress.  HENT:  Head: Normocephalic and atraumatic.  Eyes: EOM are normal.  Neck: Normal range of motion. Neck supple.  C-spine nontender.  No cervical step-off.  Full range of motion of neck without any discomfort.  C-spine cleared by Nexus criteria  Cardiovascular: Normal rate, regular rhythm and normal heart sounds.   Pulmonary/Chest: Effort normal and breath sounds normal.  Abdominal: Soft. She exhibits no distension. There is no tenderness.  Musculoskeletal: Normal range of motion.  Normal strength in her bilateral upper lower extremity major muscle groups.  Significant pain on palpation of her distal left she murmurs.  Pain with range of motion of her left elbow.  No tenderness over left clavicle.  Normal left radial pulse.  Normal grip strength and sensation in left hand.  Neurological: She is alert and oriented to person, place, and time.  Skin: Skin is warm and dry.  Psychiatric: She has a normal mood and affect. Judgment normal.    ED Course   Procedures (including critical care time)  SPLINT APPLICATION Date/Time: 11/13/2012 3:38 PM Authorized by: Lyanne Co Consent: Verbal consent obtained. Risks and benefits: risks, benefits and alternatives were discussed Consent given by: patient Splint applied by: nurse Location details: left UE Splint type: ortho glass, posterior splint Supplies used: ortho glass/ace Post-procedure: The splinted body part was neurovascularly unchanged following the procedure. Patient tolerance: Patient tolerated the procedure well with no immediate complications.     Labs Review Labs Reviewed - No data to display Imaging Review Dg Shoulder Left  05/06/2013   CLINICAL DATA:  Pain post fall.  EXAM: LEFT SHOULDER - 2+ VIEW  COMPARISON:  None.  FINDINGS: There is no evidence of fracture or dislocation. There is no evidence of arthropathy or other focal bone abnormality. Soft tissues are unremarkable. Atheromatous thoracic aorta.  IMPRESSION: Negative.   Electronically Signed   By: Oley Balm [name]   On: 05/06/2013 00:37   Dg Humerus Left  05/06/2013   CLINICAL DATA:  Pain post fall  EXAM: LEFT HUMERUS - 2+ VIEW  COMPARISON:  None.  FINDINGS: There is a transverse supracondylar fracture, with several mm anterior displacement and anterior angulation of the distal fracture fragment. No dislocation. Moderate elbow effusion. Proximal humerus unremarkable.  IMPRESSION: 1. Supracondylar humerus fracture with mild displacement and angulation of distal fragment.   Electronically Signed   By: Oley Balm [name]   On: 05/06/2013 00:36  I personally reviewed the imaging tests through PACS system I reviewed available ER/hospitalization records through the EMR   EKG Interpretation   None       MDM   1. Left supracondylar humerus fracture, closed, initial encounter    Pt would benefit from upper extremity expertise for this fracture. Referred to hand surgery in [place]. Home with pain medicine.  No indication for repair tonight. NVI. Dc home with posterior splint. Should be able to dialyze through left UE graft by pulling ACE away and accessing the anterior UE     Lyanne Co, MD 05/06/13 [phone]

## 2013-05-06 NOTE — ED Notes (Signed)
Patient was treated here yesterday for left elbow fracture and discharged home. Patient states pain medication is not working and is complaining of worsening pain.

## 2013-05-06 NOTE — Telephone Encounter (Signed)
Have patient come back early next week to see mid-level provider

## 2013-05-07 ENCOUNTER — Emergency Department (HOSPITAL_COMMUNITY): Payer: Medicare Other

## 2013-05-07 ENCOUNTER — Telehealth: Payer: Self-pay | Admitting: Cardiovascular Disease

## 2013-05-07 ENCOUNTER — Inpatient Hospital Stay (HOSPITAL_COMMUNITY)
Admission: EM | Admit: 2013-05-07 | Discharge: 2013-05-22 | DRG: 246 | Disposition: A | Discharge status: Home / Self Care | Payer: Medicare Other | Attending: Internal Medicine | Admitting: Internal Medicine

## 2013-05-07 ENCOUNTER — Encounter (HOSPITAL_COMMUNITY): Payer: Self-pay | Admitting: Emergency Medicine

## 2013-05-07 DIAGNOSIS — Z992 Dependence on renal dialysis: Secondary | ICD-10-CM | POA: Diagnosis not present

## 2013-05-07 DIAGNOSIS — N390 Urinary tract infection, site not specified: Secondary | ICD-10-CM

## 2013-05-07 DIAGNOSIS — I359 Nonrheumatic aortic valve disorder, unspecified: Secondary | ICD-10-CM | POA: Diagnosis present

## 2013-05-07 DIAGNOSIS — I2510 Atherosclerotic heart disease of native coronary artery without angina pectoris: Secondary | ICD-10-CM

## 2013-05-07 DIAGNOSIS — T45515A Adverse effect of anticoagulants, initial encounter: Secondary | ICD-10-CM | POA: Diagnosis not present

## 2013-05-07 DIAGNOSIS — I214 Non-ST elevation (NSTEMI) myocardial infarction: Secondary | ICD-10-CM | POA: Diagnosis present

## 2013-05-07 DIAGNOSIS — G92 Toxic encephalopathy: Secondary | ICD-10-CM | POA: Diagnosis present

## 2013-05-07 DIAGNOSIS — I4891 Unspecified atrial fibrillation: Secondary | ICD-10-CM

## 2013-05-07 DIAGNOSIS — I5032 Chronic diastolic (congestive) heart failure: Secondary | ICD-10-CM | POA: Diagnosis present

## 2013-05-07 DIAGNOSIS — D631 Anemia in chronic kidney disease: Secondary | ICD-10-CM | POA: Diagnosis present

## 2013-05-07 DIAGNOSIS — N2581 Secondary hyperparathyroidism of renal origin: Secondary | ICD-10-CM | POA: Diagnosis present

## 2013-05-07 DIAGNOSIS — E785 Hyperlipidemia, unspecified: Secondary | ICD-10-CM | POA: Diagnosis present

## 2013-05-07 DIAGNOSIS — I509 Heart failure, unspecified: Secondary | ICD-10-CM | POA: Diagnosis present

## 2013-05-07 DIAGNOSIS — I120 Hypertensive chronic kidney disease with stage 5 chronic kidney disease or end stage renal disease: Secondary | ICD-10-CM | POA: Diagnosis present

## 2013-05-07 DIAGNOSIS — D649 Anemia, unspecified: Secondary | ICD-10-CM

## 2013-05-07 DIAGNOSIS — I10 Essential (primary) hypertension: Secondary | ICD-10-CM | POA: Diagnosis present

## 2013-05-07 DIAGNOSIS — S42413A Displaced simple supracondylar fracture without intercondylar fracture of unspecified humerus, initial encounter for closed fracture: Secondary | ICD-10-CM | POA: Diagnosis present

## 2013-05-07 DIAGNOSIS — G929 Unspecified toxic encephalopathy: Secondary | ICD-10-CM | POA: Diagnosis present

## 2013-05-07 DIAGNOSIS — D509 Iron deficiency anemia, unspecified: Secondary | ICD-10-CM | POA: Diagnosis present

## 2013-05-07 DIAGNOSIS — R4182 Altered mental status, unspecified: Secondary | ICD-10-CM | POA: Diagnosis present

## 2013-05-07 DIAGNOSIS — R4181 Age-related cognitive decline: Secondary | ICD-10-CM | POA: Diagnosis present

## 2013-05-07 DIAGNOSIS — N186 End stage renal disease: Secondary | ICD-10-CM | POA: Diagnosis present

## 2013-05-07 DIAGNOSIS — T4275XA Adverse effect of unspecified antiepileptic and sedative-hypnotic drugs, initial encounter: Secondary | ICD-10-CM | POA: Diagnosis present

## 2013-05-07 DIAGNOSIS — R791 Abnormal coagulation profile: Secondary | ICD-10-CM | POA: Diagnosis not present

## 2013-05-07 DIAGNOSIS — I480 Paroxysmal atrial fibrillation: Secondary | ICD-10-CM | POA: Diagnosis present

## 2013-05-07 DIAGNOSIS — E876 Hypokalemia: Secondary | ICD-10-CM | POA: Diagnosis not present

## 2013-05-07 DIAGNOSIS — Y92009 Unspecified place in unspecified non-institutional (private) residence as the place of occurrence of the external cause: Secondary | ICD-10-CM | POA: Diagnosis not present

## 2013-05-07 DIAGNOSIS — I350 Nonrheumatic aortic (valve) stenosis: Secondary | ICD-10-CM

## 2013-05-07 DIAGNOSIS — W19XXXD Unspecified fall, subsequent encounter: Secondary | ICD-10-CM

## 2013-05-07 DIAGNOSIS — A498 Other bacterial infections of unspecified site: Secondary | ICD-10-CM | POA: Diagnosis present

## 2013-05-07 DIAGNOSIS — Z7901 Long term (current) use of anticoagulants: Secondary | ICD-10-CM

## 2013-05-07 DIAGNOSIS — R000 Tachycardia, unspecified: Secondary | ICD-10-CM | POA: Diagnosis not present

## 2013-05-07 DIAGNOSIS — J189 Pneumonia, unspecified organism: Secondary | ICD-10-CM

## 2013-05-07 DIAGNOSIS — W19XXXA Unspecified fall, initial encounter: Secondary | ICD-10-CM

## 2013-05-07 DIAGNOSIS — E46 Unspecified protein-calorie malnutrition: Secondary | ICD-10-CM | POA: Diagnosis present

## 2013-05-07 DIAGNOSIS — Z9181 History of falling: Secondary | ICD-10-CM | POA: Diagnosis not present

## 2013-05-07 LAB — COMPREHENSIVE METABOLIC PANEL
ALT: 7 U/L (ref 0–35)
AST: 18 U/L (ref 0–37)
Albumin: 3.2 g/dL — ABNORMAL LOW (ref 3.5–5.2)
Calcium: 8.7 mg/dL (ref 8.4–10.5)
Chloride: 94 mEq/L — ABNORMAL LOW (ref 96–112)
Creatinine, Ser: 4.42 mg/dL — ABNORMAL HIGH (ref 0.50–1.10)
Sodium: 135 mEq/L (ref 135–145)
Total Protein: 6.9 g/dL (ref 6.0–8.3)

## 2013-05-07 LAB — CBC WITH DIFFERENTIAL/PLATELET
Basophils Absolute: 0 10*3/uL (ref 0.0–0.1)
Basophils Relative: 0 % (ref 0–1)
Eosinophils Relative: 1 % (ref 0–5)
HCT: 35 % — ABNORMAL LOW (ref 36.0–46.0)
Lymphocytes Relative: 18 % (ref 12–46)
MCH: 29.4 pg (ref 26.0–34.0)
MCHC: 32 g/dL (ref 30.0–36.0)
Monocytes Absolute: 0.8 10*3/uL (ref 0.1–1.0)
Neutro Abs: 5.6 10*3/uL (ref 1.7–7.7)
Platelets: 229 10*3/uL (ref 150–400)
RDW: 13.7 % (ref 11.5–15.5)
WBC: 8 10*3/uL (ref 4.0–10.5)

## 2013-05-07 LAB — PROTIME-INR
INR: 4.57 — ABNORMAL HIGH (ref 0.00–1.49)
Prothrombin Time: 41.5 seconds — ABNORMAL HIGH (ref 11.6–15.2)

## 2013-05-07 LAB — TROPONIN I: Troponin I: 2.93 ng/mL (ref <0.30)

## 2013-05-07 MED ORDER — DILTIAZEM HCL 100 MG IV SOLR
5.0000 mg/h | INTRAVENOUS | Status: DC
  Administered 2013-05-10: 10 mg/h via INTRAVENOUS
  Administered 2013-05-10 – 2013-05-11 (×2): 5 mg/h via INTRAVENOUS
  Administered 2013-05-12: 10 mg/h via INTRAVENOUS
  Administered 2013-05-12: 5 mg/h via INTRAVENOUS
  Administered 2013-05-13 – 2013-05-14 (×3): 10 mg/h via INTRAVENOUS
  Filled 2013-05-07 (×9): qty 100

## 2013-05-07 MED ORDER — WARFARIN SODIUM 6 MG PO TABS: 6.0000 mg | ORAL_TABLET | Freq: Once | ORAL | Status: DC

## 2013-05-07 MED ORDER — SODIUM BICARBONATE 650 MG PO TABS
1300.0000 mg | ORAL_TABLET | Freq: Two times a day (BID) | ORAL | Status: DC
  Administered 2013-05-07 – 2013-05-10 (×6): 1300 mg via ORAL
  Filled 2013-05-07 (×9): qty 2

## 2013-05-07 MED ORDER — WARFARIN - PHARMACIST DOSING INPATIENT: Freq: Every day | Status: DC

## 2013-05-07 MED ORDER — SEVELAMER CARBONATE 800 MG PO TABS
2400.0000 mg | ORAL_TABLET | Freq: Three times a day (TID) | ORAL | Status: DC
  Administered 2013-05-08 – 2013-05-22 (×25): 2400 mg via ORAL
  Filled 2013-05-07 (×46): qty 3

## 2013-05-07 MED ORDER — LABETALOL HCL 200 MG PO TABS: 200.0000 mg | ORAL_TABLET | Freq: Two times a day (BID) | ORAL | Status: DC

## 2013-05-07 MED ORDER — WARFARIN SODIUM 6 MG PO TABS: 6.0000 mg | ORAL_TABLET | Freq: Every day | ORAL | Status: DC

## 2013-05-07 MED ORDER — DILTIAZEM HCL ER 120 MG PO CP24: 120.0000 mg | ORAL_CAPSULE | Freq: Every day | ORAL | Status: DC

## 2013-05-07 MED ORDER — SODIUM CHLORIDE 0.9 % IV BOLUS (SEPSIS)
250.0000 mL | Freq: Once | INTRAVENOUS | Status: AC
  Administered 2013-05-07: 250 mL via INTRAVENOUS

## 2013-05-07 MED ORDER — ATORVASTATIN CALCIUM 40 MG PO TABS
40.0000 mg | ORAL_TABLET | Freq: Every day | ORAL | Status: DC
  Administered 2013-05-07 – 2013-05-17 (×10): 40 mg via ORAL
  Filled 2013-05-07 (×14): qty 1

## 2013-05-07 MED ORDER — VITAMIN K1 10 MG/ML IJ SOLN
5.0000 mg | Freq: Once | INTRAVENOUS | Status: AC
  Administered 2013-05-07: 5 mg via INTRAVENOUS
  Filled 2013-05-07: qty 0.5

## 2013-05-07 MED ORDER — SODIUM CHLORIDE 0.9 % IJ SOLN: 3.0000 mL | Freq: Two times a day (BID) | INTRAMUSCULAR | Status: DC

## 2013-05-07 MED ORDER — WARFARIN - PHYSICIAN DOSING INPATIENT: Freq: Every day | Status: DC

## 2013-05-07 MED ORDER — ONDANSETRON HCL 4 MG PO TABS: 4.0000 mg | ORAL_TABLET | Freq: Four times a day (QID) | ORAL | Status: DC | PRN

## 2013-05-07 MED ORDER — OXYCODONE-ACETAMINOPHEN 5-325 MG PO TABS
1.0000 | ORAL_TABLET | ORAL | Status: DC | PRN
  Administered 2013-05-08 (×2): 1 via ORAL
  Filled 2013-05-07 (×3): qty 1

## 2013-05-07 MED ORDER — SODIUM CHLORIDE 0.9 % IV SOLN
250.0000 mL | INTRAVENOUS | Status: DC | PRN
  Administered 2013-05-07: 250 mL via INTRAVENOUS

## 2013-05-07 MED ORDER — ACETAMINOPHEN 325 MG PO TABS
650.0000 mg | ORAL_TABLET | Freq: Once | ORAL | Status: AC
  Administered 2013-05-07: 650 mg via ORAL
  Filled 2013-05-07: qty 2

## 2013-05-07 MED ORDER — ONDANSETRON HCL 4 MG/2ML IJ SOLN: 4.0000 mg | Freq: Four times a day (QID) | INTRAMUSCULAR | Status: DC | PRN

## 2013-05-07 MED ORDER — SODIUM CHLORIDE 0.9 % IJ SOLN: 3.0000 mL | INTRAMUSCULAR | Status: DC | PRN

## 2013-05-07 MED ORDER — SORBITOL 70 % PO SOLN
30.0000 mL | Freq: Every evening | ORAL | Status: DC | PRN
  Filled 2013-05-07: qty 30

## 2013-05-07 NOTE — ED Notes (Signed)
Family reports pt had a recent fall and not handling the pain well, went to dialysis this morning then came home c/o pain to left elbow. Family gave pain medicine, 2 percocet tabs, then a little later pt starting acting different, more lethargic and saying funny things. Nad, skin warm and dry, resp e/u. Splint on left elbow.

## 2013-05-07 NOTE — Telephone Encounter (Signed)
Patient is returning your call.  

## 2013-05-07 NOTE — Progress Notes (Signed)
ANTICOAGULATION CONSULT NOTE - Initial Consult  Pharmacy Consult for Heparin (when INR <2) Indication: chest pain/ACS  No Known Allergies  Patient Measurements: Height: 5\' 5"  (165.1 cm) Weight: 115 lb (52.164 kg) IBW/kg (Calculated) : 57  Vital Signs: Temp: 98.1 F (36.7 C) (10/31 1441) Temp src: Oral (10/31 1441) BP: 82/46 mmHg (10/31 2100) Pulse Rate: 111 (10/31 2100)  Labs:  Recent Labs  05/07/13 1549 05/07/13 2041  HGB 11.2*  --   HCT 35.0*  --   PLT 229  --   LABPROT 41.5*  --   INR 4.57*  --   CREATININE 4.42*  --   TROPONINI 1.64* 2.93*    Estimated Creatinine Clearance: 8.1 ml/min (by C-G formula based on Cr of 4.42).   Medical History: Past Medical History  Diagnosis Date  . Anemia 03/06/2011  . Hypertension   . Coronary artery disease   . Renal disorder   . Aortic stenosis, mild   . A-fib   . Acute CHF     Medications:  Warfarin PTA  Assessment: 79 y/o F with rising troponin, to start heparin per pharmacy when INR <2. INR on admit was 4.57, 5mg  IV Vit K has been given--will not have immediate effect on INR. Other labs as above.   Goal of Therapy:  Heparin level 0.3-0.7 units/ml Monitor platelets by anticoagulation protocol: Yes   Plan:  -Holding warfarin -Heparin when INR <2 -INR daily in the AM -Cardiology to see  Thank you for allowing me to take part in this patient's care,  Abran Duke, PharmD Clinical Pharmacist Phone: [phone] Pager: [phone] 05/07/2013 10:57 PM

## 2013-05-07 NOTE — ED Notes (Signed)
Patient ambulated with assistance from bed to chair in room. Patient tolerated well. No noted distress with ambulation.

## 2013-05-07 NOTE — Telephone Encounter (Signed)
Returned call.  Female answering stated pt is at dialysis.  Left message for pt to call back.

## 2013-05-07 NOTE — H&P (Addendum)
[facility] History and Physical  [name] AOZ:[id number] DOB: 21-Apr-1928 DOA: 05/07/2013  Referring physician: ED physician PCP: [name], MD   Chief Complaint: altered mental status   HPI:  Pt is 79 yo female with history of atrial fibrillation on Coumadin, HTN, HLD, ESRD on HD MWF, grade II diastolic dysfunction per last 2 D ECHO 11/2012, with recent fall at home and with subsequent supracondylar humerus fracture with mild displacement and angulation of distal fragment, no brought to [facility] ED after found to be more confused at home after she tool 2 tablets of percocet for pain. Please note that pt is rather somnolent at the time of the admission and unable to provide history. She was apparently in HD today and took tablets after HD, family noted she was somewhat confused prior to HD but more so after the session. Pt able to reports no pain at this time, no chest pain and no shortness of breath, no reported fevers, chills, no specific focal neurological symptoms. Per ED physician, pt more alert now compared to mental status on arrival but due to somnolence TRH asked to admit to telemetry floor for further observation and management. Telemetry bed requested. Secondary to hypotension and elevated troponin, order changed to stepdown bed.   Assessment and Plan:  Principal Problem:   Altered mental status - possibly related to narcotic use but exact etiology is unclear - elevated troponin noted on admission but unclear if that is related to AMS - will admit to stepdown bed for further evaluation - will check 12 lead EKG and will cycle CE's, check TSH - provide supportive care, oxygen and analgesia as needed - rate control with Cardizem drip, hold Coumadin as INR > 4 - hold Labetalol and Cardizem PO due to hypotension  - UA and urine culture pending  Active Problems:   Atrial fibrillation with RVR - HR on admission in 110's - monitor closely in stepdown and place on Cardizem  drip  - cardiology consulted    Elevated troponin - uncler etiology, pt denies chest pain at this time and troponins can be elevated in pt with renal disease - troponin on 04/20/2013 was < 0.30 - continue to cycle CE's and follow up on cardiology recommendations    Anemia of chronic disease, renal disease  - Hg appears to be stable and at baseline - CBC in AM   Hypotension  - BP is on soft side and will therefore hold Labetalol and Cardizem PO - place on Cardizem drip for now    Long term (current) use of anticoagulants - for atrial fibrillation, INR > 4 - hold Coumadin, give one dose of VIt K 5 mg IV   ESRD (end stage renal disease) on dialysis - if pt requires longer stay, please call nephrology team for HD on Monday    Code Status: Full Family Communication: No family at bedside  Disposition Plan: Admit to setpdown unit    Review of Systems:  Unable to obtain due to altered mental status     Past Medical History  Diagnosis Date  . Anemia 03/06/2011  . Hypertension   . Coronary artery disease   . Renal disorder   . Aortic stenosis, mild   . A-fib   . Acute CHF     Past Surgical History  Procedure Laterality Date  . Av fistula placement      L arm  . Other surgical history Bilateral     Surgery on both arms.   Marland Kitchen  Fracture surgery  Bil. Arms    Social History:  reports that she has never smoked. She has never used smokeless tobacco. She reports that she does not drink alcohol or use illicit drugs.  No Known Allergies  No known family medical history   Prior to Admission medications   Medication Sig Start Date End Date Taking? Authorizing Provider  atorvastatin (LIPITOR) 40 MG tablet Take 40 mg by mouth daily.     Yes Historical Provider, MD  diltiazem (DILACOR XR) 120 MG 24 hr capsule Take 120 mg by mouth daily.   Yes Historical Provider, MD  labetalol (NORMODYNE) 200 MG tablet Take 200 mg by mouth 2 (two) times daily.     Yes Historical Provider, MD   oxyCODONE-acetaminophen (PERCOCET/ROXICET) 5-325 MG per tablet Take 1 tablet by mouth every 4 (four) hours as needed for pain. 05/06/13  Yes Lyanne Co, MD  sevelamer carbonate (RENVELA) 800 MG tablet Take 2,400 mg by mouth 3 (three) times daily with meals.   Yes Historical Provider, MD  sodium bicarbonate 650 MG tablet Take 1,300 mg by mouth 2 (two) times daily.    Yes Historical Provider, MD  sorbitol 70 % solution Take 30 mLs by mouth at bedtime as needed.   Yes Historical Provider, MD  warfarin (COUMADIN) 6 MG tablet Take 6 mg by mouth daily.    Yes Historical Provider, MD    Physical Exam: Filed Vitals:   05/07/13 1730 05/07/13 1800 05/07/13 1816 05/07/13 1913  BP: 80/46 75/46 76/34  80/59  Pulse: 127 109 124 49  Temp:      TempSrc:      Resp: 18 17 16 12   Height:      Weight:      SpO2: 97% 97% 96% 100%    Physical Exam  Constitutional: Appears somnolent but easy to arouse, follows some commands   HENT: Normocephalic. External right and left ear normal. Dry MM Eyes: Conjunctivae and EOM are normal. PERRLA, no scleral icterus.  Neck: Normal ROM. Neck supple. No JVD. No tracheal deviation. No thyromegaly.  CVS: Irregular rhythm with HR 40 - 120's,  SEM 3/6, no gallops, no carotid bruit.  Pulmonary: Effort and breath sounds normal, no stridor, diminished breath sounds at bases  Abdominal: Soft. BS +,  no distension, tenderness, rebound or guarding.  Musculoskeletal: Normal range of motion. Trace bilateral LE pitting edema, no tenderness.  Lymphadenopathy: No lymphadenopathy noted, cervical, inguinal. Neuro: Somnolent but no focal deficit noted  Skin: Skin is warm and dry. No rash noted. Not diaphoretic. No erythema. No pallor.  Psychiatric: Unable to assess due to somnolence   Labs on Admission:  Basic Metabolic Panel:  Recent Labs Lab 05/07/13 1549  NA 135  K 3.6  CL 94*  CO2 27  GLUCOSE 116*  BUN 22  CREATININE 4.42*  CALCIUM 8.7   Liver Function  Tests:  Recent Labs Lab 05/07/13 1549  AST 18  ALT 7  ALKPHOS 52  BILITOT 0.4  PROT 6.9  ALBUMIN 3.2*   CBC:  Recent Labs Lab 05/07/13 1549  WBC 8.0  NEUTROABS 5.6  HGB 11.2*  HCT 35.0*  MCV 91.9  PLT 229   Cardiac Enzymes:  Recent Labs Lab 05/07/13 1549  TROPONINI 1.64*   Radiological Exams on Admission: Dg Chest 2 View   05/07/2013    No acute abnormality noted.  Dg Shoulder Left   05/06/2013    Negative.   Dg Humerus Left  05/06/2013   Supracondylar humerus  fracture with mild displacement and angulation of distal fragment.     EKG: Normal sinus rhythm, no ST/T wave changes  Debbora Presto, MD  [facility] Pager [phone]  If 7PM-7AM, please contact night-coverage [email or web address] Password [facility] 05/07/2013, 8:24 PM

## 2013-05-07 NOTE — ED Notes (Signed)
Admitting Dr. Luberta Robertson regarding pt's VS. Awaiting return call at this time

## 2013-05-07 NOTE — Telephone Encounter (Signed)
Spoke with daughter. She states that patient is on her way to the hospital ER ,because she was "out of it" per daughter. she had received 2 tablets of pain medication tonight and this morning . She broke her arm /elbow this week.  Informed her daughter to notify her sister who are with the patient now.,Let the ER know that the patient is a patient of Dr Allyson Sabal. She verbalized understanding.

## 2013-05-07 NOTE — ED Notes (Signed)
Dr. Izola Price notified of pt's Troponin

## 2013-05-07 NOTE — ED Provider Notes (Signed)
79 year old female had been in emergency yesterday after a fall and suffering a supracondylar fracture of her left elbow. She was treated with a splint and given prescription for oxycodone-acetaminophen and referred to hand surgery for followup. She was not getting adequate pain relief at home so she came back to the ED. She has been taking oxycodone acetaminophen 5-325 taking 1 tablet at a time and only getting very brief period of pain relief. She she was having difficulty with any kind of movement. Her splint was removed and she was placed in a long-arm posterior and upper arm sugar tong splint which seems to immobilize her fracture better and she was also given a 2 tablets of oxycodone-acetaminophen 5-325. This combination is giving her adequate pain relief. She is to keep her appointment with her hand specialist and she is to go for her dialysis as scheduled.  Medical screening examination/treatment/procedure(s) were conducted as a shared visit with non-physician practitioner(s) and myself.  I personally evaluated the patient during the encounter.   Dione Booze, MD 05/07/13 [phone]

## 2013-05-07 NOTE — Consult Note (Addendum)
Admit date: 05/07/2013 Referring Physician  Dr. Gonzella Lex Primary Physician  Dr. Mirna Mires Primary Cardiologist  Dr. Nanetta Batty Reason for Consultation  Atrial fibrillation with RVR and elevated troponin  HPI: This is an 79 yo female with history of atrial fibrillation on Coumadin, HTN, HLD, ESRD on HD MWF, grade II diastolic dysfunction per last 2 D ECHO 11/2012, with recent fall at home and with subsequent supracondylar humerus fracture with mild displacement and angulation of distal fragment, now brought to [facility] ED after found to be more confused at home after she took 2 tablets of percocet for pain. Please note that pt is rather somnolent at the time of the admission and unable to provide history. She was apparently in HD today and took tablets after HD, family noted she was somewhat confused prior to HD but more so after the session. Pt able to reports no pain at this time, no chest pain and no shortness of breath, no reported fevers, chills, no specific focal neurological symptoms.  SHe was found to have an elevated troponin and cardiology was asked to evaluate.  She was found to be in afib with RVR at 110bpm but is now in NSR after Cardizem gtt.    PMH:   Past Medical History  Diagnosis Date  . Anemia 03/06/2011  . Hypertension   . Coronary artery disease   . Renal disorder   . Aortic stenosis, mild   . A-fib   . Acute CHF      PSH:   Past Surgical History  Procedure Laterality Date  . Av fistula placement      L arm  . Other surgical history Bilateral     Surgery on both arms.   . Fracture surgery  Bil. Arms    Allergies:  Review of patient's allergies indicates no known allergies. Prior to Admit Meds:   Prescriptions prior to admission  Medication Sig Dispense Refill  . atorvastatin (LIPITOR) 40 MG tablet Take 40 mg by mouth daily.        Marland Kitchen diltiazem (DILACOR XR) 120 MG 24 hr capsule Take 120 mg by mouth daily.      Marland Kitchen labetalol (NORMODYNE) 200 MG tablet Take 200 mg by  mouth 2 (two) times daily.        Marland Kitchen oxyCODONE-acetaminophen (PERCOCET/ROXICET) 5-325 MG per tablet Take 1 tablet by mouth every 4 (four) hours as needed for pain.  30 tablet  0  . sevelamer carbonate (RENVELA) 800 MG tablet Take 2,400 mg by mouth 3 (three) times daily with meals.      . sodium bicarbonate 650 MG tablet Take 1,300 mg by mouth 2 (two) times daily.       . sorbitol 70 % solution Take 30 mLs by mouth at bedtime as needed.      . warfarin (COUMADIN) 6 MG tablet Take 6 mg by mouth daily.        Fam HX:   No family history on file. Social HX:    History   Social History  . Marital Status: Married    Spouse Name: N/A    Number of Children: N/A  . Years of Education: N/A   Occupational History  . Not on file.   Social History Main Topics  . Smoking status: Never Smoker   . Smokeless tobacco: Never Used  . Alcohol Use: No  . Drug Use: No  . Sexual Activity: Not on file   Other Topics Concern  . Not on file  Social History Narrative  . No narrative on file     ROS:  All 11 ROS were addressed and are negative except what is stated in the HPI  Physical Exam: Blood pressure 82/46, pulse 111, temperature 98.1 F (36.7 C), temperature source Oral, resp. rate 21, height 5\' 5"  (1.651 m), weight 115 lb (52.164 kg), SpO2 98.00%.    General: Well developed, well nourished, in no acute distress Head: Eyes PERRLA, No xanthomas.   Normal cephalic and atramatic  Lungs:   Clear bilaterally to auscultation and percussion. Heart:   HRRR S1 S2 Pulses are 2+ & equal.            No carotid bruit. No JVD.  No abdominal bruits. No femoral bruits. Abdomen: Bowel sounds are positive, abdomen soft and non-tender without masses  Extremities:   No clubbing, cyanosis or edema.  DP +1 Neuro: Alert and oriented X 3. Psych:  Good affect, responds appropriately    Labs:   Lab Results  Component Value Date   WBC 8.0 05/07/2013   HGB 11.2* 05/07/2013   HCT 35.0* 05/07/2013   MCV 91.9  05/07/2013   PLT 229 05/07/2013    Recent Labs Lab 05/07/13 1549  NA 135  K 3.6  CL 94*  CO2 27  BUN 22  CREATININE 4.42*  CALCIUM 8.7  PROT 6.9  BILITOT 0.4  ALKPHOS 52  ALT 7  AST 18  GLUCOSE 116*   No results found for this basename: PTT   Lab Results  Component Value Date   INR 4.57* 05/07/2013   INR 1.8 04/26/2013   INR 2.17* 04/20/2013   Lab Results  Component Value Date   CKTOTAL 53 06/18/2011   CKMB 2.4 06/18/2011   TROPONINI 2.93* 05/07/2013     Lab Results  Component Value Date   CHOL 220* 11/10/2012   Lab Results  Component Value Date   HDL 95 11/10/2012   Lab Results  Component Value Date   LDLCALC 101* 11/10/2012   Lab Results  Component Value Date   TRIG 122 11/10/2012   Lab Results  Component Value Date   CHOLHDL 2.3 11/10/2012   No results found for this basename: LDLDIRECT      Radiology:  Dg Chest 2 View  05/07/2013   CLINICAL DATA:  Chest pain and shortness of breath  EXAM: CHEST  2 VIEW  COMPARISON:  04/20/2013  FINDINGS: The cardiac shadow is mildly enlarged. The lungs are clear bilaterally. A stable compression deformity is seen in the upper thoracic spine. No other focal abnormality is noted.  IMPRESSION: No acute abnormality noted.   Electronically Signed   By: Alcide Clever [name]   On: 05/07/2013 17:17   Dg Shoulder Left  05/06/2013   CLINICAL DATA:  Pain post fall.  EXAM: LEFT SHOULDER - 2+ VIEW  COMPARISON:  None.  FINDINGS: There is no evidence of fracture or dislocation. There is no evidence of arthropathy or other focal bone abnormality. Soft tissues are unremarkable. Atheromatous thoracic aorta.  IMPRESSION: Negative.   Electronically Signed   By: Oley Balm [name]   On: 05/06/2013 00:37   Dg Humerus Left  05/06/2013   CLINICAL DATA:  Pain post fall  EXAM: LEFT HUMERUS - 2+ VIEW  COMPARISON:  None.  FINDINGS: There is a transverse supracondylar fracture, with several mm anterior displacement and anterior angulation of the  distal fracture fragment. No dislocation. Moderate elbow effusion. Proximal humerus unremarkable.  IMPRESSION: 1. Supracondylar humerus fracture  with mild displacement and angulation of distal fragment.   Electronically Signed   By: Oley Balm [name]   On: 05/06/2013 00:36    EKG:  Atrial fibrillation with RVR and lateral T wave inversion c/w lateral ischemia  ASSESSMENT:  1.  Afib with RVR now in NSR after IV Cardizem gtt 2.  Elevated troponin in setting of ESRD and afib with RVR most c/w demand ischemia and patient denies chest pain.  She also just fell this week and fractured her arm and her family is unsure if she was on the floor for a while before they found her.  This could have caused some mild rhabdo. 3.  Altered MS most likely related to narcotic use 4. Hypotension ? Secondary to narcotics - Labetolol on hold 5.  Chronic anticoagulation with supratherapeutic INR 6.  ESRD on HD  PLAN:   1.  Agree with holding coumadin 2.  Check CPK and MB 3.  Continue to cycle troponins 4.  Check 2D echo 5.  Continue statin 6.  Further w/u per Dr. Rosamaria Lints, MD  05/07/2013  10:45 PM

## 2013-05-07 NOTE — ED Notes (Signed)
Per EMS- pt had a fall on Wednesday and fractured her elbow, was seen at [facility] given pain meds, wasn't working enough so she went back to [facility] for more pain meds. Started taking 2 percocet then family c/o more fatigue and "talking funny".

## 2013-05-07 NOTE — ED Notes (Signed)
Lab at bedside

## 2013-05-07 NOTE — ED Provider Notes (Signed)
CSN: [id number]     Arrival date & time 05/07/13  1441 History   First MD Initiated Contact with Patient 05/07/13 1517     Chief Complaint  Patient presents with  . Altered Mental Status   (Consider location/radiation/quality/duration/timing/severity/associated sxs/prior Treatment) Patient is a 79 y.o. female presenting with altered mental status. The history is provided by the patient.  Altered Mental Status Presenting symptoms: confusion and disorientation   Severity:  Mild Most recent episode:  Today Episode history:  Continuous Timing:  Constant Progression:  Improving Chronicity:  New Context comment:  New narcotic use Associated symptoms: no abdominal pain, no fever, no headaches, no nausea and no vomiting     Past Medical History  Diagnosis Date  . Anemia 03/06/2011  . Hypertension   . Coronary artery disease   . Renal disorder   . Aortic stenosis, mild   . A-fib   . Acute CHF    Past Surgical History  Procedure Laterality Date  . Av fistula placement      L arm  . Other surgical history Bilateral     Surgery on both arms.   . Fracture surgery  Bil. Arms   No family history on file. History  Substance Use Topics  . Smoking status: Never Smoker   . Smokeless tobacco: Never Used  . Alcohol Use: No   OB History   Grav Para Term Preterm Abortions TAB SAB Ect Mult Living                 Review of Systems  Constitutional: Negative for fever and fatigue.  HENT: Negative for congestion and drooling.   Eyes: Negative for pain.  Respiratory: Negative for cough and shortness of breath.   Cardiovascular: Negative for chest pain.  Gastrointestinal: Negative for nausea, vomiting, abdominal pain and diarrhea.  Genitourinary: Negative for dysuria and hematuria.  Musculoskeletal: Negative for back pain, gait problem and neck pain.  Skin: Negative for color change.  Neurological: Negative for dizziness and headaches.  Hematological: Negative for adenopathy.   Psychiatric/Behavioral: Positive for confusion. Negative for behavioral problems.  All other systems reviewed and are negative.    Allergies  Review of patient's allergies indicates no known allergies.  Home Medications   Current Outpatient Rx  Name  Route  Sig  Dispense  Refill  . atorvastatin (LIPITOR) 40 MG tablet   Oral   Take 40 mg by mouth daily.           Marland Kitchen diltiazem (DILACOR XR) 120 MG 24 hr capsule   Oral   Take 120 mg by mouth daily.         Marland Kitchen labetalol (NORMODYNE) 200 MG tablet   Oral   Take 200 mg by mouth 2 (two) times daily.           Marland Kitchen oxyCODONE-acetaminophen (PERCOCET/ROXICET) 5-325 MG per tablet   Oral   Take 1 tablet by mouth every 4 (four) hours as needed for pain.   30 tablet   0   . sevelamer carbonate (RENVELA) 800 MG tablet   Oral   Take 2,400 mg by mouth 3 (three) times daily with meals.         . sodium bicarbonate 650 MG tablet   Oral   Take 1,300 mg by mouth 2 (two) times daily.          . sorbitol 70 % solution   Oral   Take 30 mLs by mouth at bedtime as needed.         Marland Kitchen  warfarin (COUMADIN) 6 MG tablet   Oral   Take 6 mg by mouth daily.           BP 104/51  Pulse 118  Temp(Src) 98.1 F (36.7 C) (Oral)  Resp 18  Ht 5\' 5"  (1.651 m)  Wt 115 lb (52.164 kg)  BMI 19.14 kg/m2  SpO2 95% Physical Exam  Nursing note and vitals reviewed. Constitutional: She is oriented to person, place, and time. She appears well-developed and well-nourished.  HENT:  Head: Normocephalic.  Mouth/Throat: Oropharynx is clear and moist. No oropharyngeal exudate.  Eyes: Conjunctivae and EOM are normal. Pupils are equal, round, and reactive to light.  Neck: Normal range of motion. Neck supple.  Cardiovascular: Regular rhythm and intact distal pulses.  Exam reveals no gallop and no friction rub.   Murmur (systolic) heard. Tachycardic HR 118  Pulmonary/Chest: Effort normal and breath sounds normal. No respiratory distress. She has no  wheezes.  Abdominal: Soft. Bowel sounds are normal. There is no tenderness. There is no rebound and no guarding.  Musculoskeletal: Normal range of motion. She exhibits no edema and no tenderness.  Neurological: She is alert and oriented to person, place, and time. No cranial nerve deficit or sensory deficit.  Strength is normal in right upper extremity and lower extremities. I did not test strength in the left upper extremity due to the recent fracture and splinting.  Skin: Skin is warm and dry.  Psychiatric: She has a normal mood and affect. Her behavior is normal.    ED Course  Procedures (including critical care time) Labs Review Labs Reviewed  CBC WITH DIFFERENTIAL - Abnormal; Notable for the following:    RBC 3.81 (*)    Hemoglobin 11.2 (*)    HCT 35.0 (*)    All other components within normal limits  COMPREHENSIVE METABOLIC PANEL - Abnormal; Notable for the following:    Chloride 94 (*)    Glucose, Bld 116 (*)    Creatinine, Ser 4.42 (*)    Albumin 3.2 (*)    GFR calc non Af Amer 8 (*)    GFR calc Af Amer 10 (*)    All other components within normal limits  TROPONIN I - Abnormal; Notable for the following:    Troponin I 1.64 (*)    All other components within normal limits  PROTIME-INR - Abnormal; Notable for the following:    Prothrombin Time 41.5 (*)    INR 4.57 (*)    All other components within normal limits  PROTIME-INR - Abnormal; Notable for the following:    Prothrombin Time 23.4 (*)    INR 2.16 (*)    All other components within normal limits  TROPONIN I - Abnormal; Notable for the following:    Troponin I 2.93 (*)    All other components within normal limits  TROPONIN I - Abnormal; Notable for the following:    Troponin I 7.77 (*)    All other components within normal limits  TSH - Abnormal; Notable for the following:    TSH 5.516 (*)    All other components within normal limits  BASIC METABOLIC PANEL - Abnormal; Notable for the following:    BUN 33 (*)     Creatinine, Ser 5.28 (*)    GFR calc non Af Amer 7 (*)    GFR calc Af Amer 8 (*)    All other components within normal limits  CBC - Abnormal; Notable for the following:    RBC 3.68 (*)  Hemoglobin 10.8 (*)    HCT 33.5 (*)    All other components within normal limits  URINALYSIS, ROUTINE W REFLEX MICROSCOPIC - Abnormal; Notable for the following:    Color, Urine AMBER (*)    APPearance TURBID (*)    Hgb urine dipstick MODERATE (*)    Bilirubin Urine SMALL (*)    Protein, ur >300 (*)    Nitrite POSITIVE (*)    Leukocytes, UA MODERATE (*)    All other components within normal limits  CK TOTAL AND CKMB - Abnormal; Notable for the following:    Total CK 200 (*)    CK, MB 13.5 (*)    Relative Index 6.8 (*)    All other components within normal limits  URINE MICROSCOPIC-ADD ON - Abnormal; Notable for the following:    Squamous Epithelial / LPF MANY (*)    Bacteria, UA MANY (*)    All other components within normal limits  MRSA PCR SCREENING  URINE CULTURE  PROCALCITONIN  TROPONIN I  TROPONIN I   Imaging Review Dg Shoulder Left  05/06/2013   CLINICAL DATA:  Pain post fall.  EXAM: LEFT SHOULDER - 2+ VIEW  COMPARISON:  None.  FINDINGS: There is no evidence of fracture or dislocation. There is no evidence of arthropathy or other focal bone abnormality. Soft tissues are unremarkable. Atheromatous thoracic aorta.  IMPRESSION: Negative.   Electronically Signed   By: Oley Balm [name]   On: 05/06/2013 00:37   Dg Humerus Left  05/06/2013   CLINICAL DATA:  Pain post fall  EXAM: LEFT HUMERUS - 2+ VIEW  COMPARISON:  None.  FINDINGS: There is a transverse supracondylar fracture, with several mm anterior displacement and anterior angulation of the distal fracture fragment. No dislocation. Moderate elbow effusion. Proximal humerus unremarkable.  IMPRESSION: 1. Supracondylar humerus fracture with mild displacement and angulation of distal fragment.   Electronically Signed   By: Oley Balm [name]   On: 05/06/2013 00:36    EKG Interpretation     Ventricular Rate:  123 PR Interval:    QRS Duration: 108 QT Interval:  344 QTC Calculation: 492 R Axis:   67 Text Interpretation:  Atrial flutter with varied AV block, Probable anteroseptal infarct, old Borderline repolarization abnormality           CRITICAL CARE Performed by: Purvis Sheffield, S Total critical care time: 30 min Critical care time was exclusive of separately billable procedures and treating other patients. Critical care was necessary to treat or prevent imminent or life-threatening deterioration. Critical care was time spent personally by me on the following activities: development of treatment plan with patient and/or surrogate as well as nursing, discussions with consultants, evaluation of patient's response to treatment, examination of patient, obtaining history from patient or surrogate, ordering and performing treatments and interventions, ordering and review of laboratory studies, ordering and review of radiographic studies, pulse oximetry and re-evaluation of patient's condition.  MDM   1. Altered mental status   2. Atrial fibrillation with RVR   3. A-fib   4. Anemia   5. NSTEMI (non-ST elevated myocardial infarction)   6. Aortic stenosis, mild   7. ESRD (end stage renal disease) on dialysis    3:40 PM 79 y.o. female w hx of afib on coumadin, CHF, mild AS, CAD, s/p recent fall w/ left supracondylar fx who presents with altered mental status. The patient was seen here last night due to increased pain in her left arm. She was splinted and  it was recommended that she try taking 2 Percocet instead of one for her pain. She took 2 this morning at 4 AM and then 2 more around 11 AM after dialysis. She was noted to be confused prior to and after dialysis. Her family notes that her mental status seems to be clearing now. She is alert and oriented x3 on exam without focal weakness or numbness. She  denies pain or sob. Of note she has mild hypertension and appears to be in A. fib with RVR. I suspect her altered mental status is associated with narcotic pain medicine, but will get screening labs. The patient may require admission for titration of pain medicine and HR control.    Discussed troponin, BP, and HR w/ cards who suspects HR assoc w/ pain response in pt w/ chronic a fib and trop difficult to interpret in setting of CKD, but is new compared to previous. Will admit to hospitalist.   Critical care time billed in this patient w/ NSTEMI, Afib w/ RVR, and hypotension. Critical care time included focused medical decision making, chart review, discussions w/ family, repeat evaluations of the pt, and consultation with cardiology and hospitalist.   Junius Argyle, MD 05/08/13 1144

## 2013-05-07 NOTE — Progress Notes (Addendum)
Troponin trending up, may need heparin when INR down, currently > 4 so can not start heparin. Debbora Presto, MD  [facility] Pager [phone]   If 7PM-7AM, please contact night-coverage [email or web address] Password [email or web address]

## 2013-05-07 NOTE — ED Provider Notes (Signed)
CSN: [id number]     Arrival date & time 05/06/13  2144 History   First MD Initiated Contact with Patient 05/06/13 2256     Chief Complaint  Patient presents with  . Arm Pain   (Consider location/radiation/quality/duration/timing/severity/associated sxs/prior Treatment) HPI Comments: Pt is an 79 y/o female who present to the ED by EMS with c/o elbow pain. Pt sustained a fall on yesterday 10/29 and broke the left elbow. She was place in a posterior splint, sling, and given pain meds. Pt states today she can not stand to move because of the left elbow and shoulder pain. She is taking her percocet, but only receiving of relief before the pain returns. No new injury reported. She request recheck and assistance with her pain.  Patient is a 79 y.o. female presenting with arm pain. The history is provided by the patient.  Arm Pain Associated symptoms include arthralgias and chest pain. Pertinent negatives include no abdominal pain, coughing or neck pain.    Past Medical History  Diagnosis Date  . Anemia 03/06/2011  . Hypertension   . Coronary artery disease   . Renal disorder   . Aortic stenosis, mild   . A-fib   . Acute CHF    Past Surgical History  Procedure Laterality Date  . Av fistula placement      L arm  . Other surgical history Bilateral     Surgery on both arms.   . Fracture surgery  Bil. Arms   History reviewed. No pertinent family history. History  Substance Use Topics  . Smoking status: Never Smoker   . Smokeless tobacco: Never Used  . Alcohol Use: No   OB History   Grav Para Term Preterm Abortions TAB SAB Ect Mult Living                 Review of Systems  Constitutional: Negative for activity change.       All ROS Neg except as noted in HPI  HENT: Negative for nosebleeds.   Eyes: Negative for photophobia and discharge.  Respiratory: Negative for cough, shortness of breath and wheezing.   Cardiovascular: Positive for chest pain. Negative for palpitations.   Gastrointestinal: Negative for abdominal pain and blood in stool.  Genitourinary: Negative for dysuria, frequency and hematuria.  Musculoskeletal: Positive for arthralgias and back pain. Negative for neck pain.  Skin: Negative.   Neurological: Negative for dizziness, seizures and speech difficulty.  Psychiatric/Behavioral: Negative for hallucinations and confusion.    Allergies  Review of patient's allergies indicates no known allergies.  Home Medications   Current Outpatient Rx  Name  Route  Sig  Dispense  Refill  . atorvastatin (LIPITOR) 40 MG tablet   Oral   Take 40 mg by mouth daily.           Marland Kitchen diltiazem (DILACOR XR) 120 MG 24 hr capsule   Oral   Take 120 mg by mouth daily.         Marland Kitchen labetalol (NORMODYNE) 200 MG tablet   Oral   Take 200 mg by mouth 2 (two) times daily.           Marland Kitchen oxyCODONE-acetaminophen (PERCOCET/ROXICET) 5-325 MG per tablet   Oral   Take 1 tablet by mouth every 4 (four) hours as needed for pain.   30 tablet   0   . sevelamer carbonate (RENVELA) 800 MG tablet   Oral   Take 2,400 mg by mouth 3 (three) times daily with meals.         Marland Kitchen  sodium bicarbonate 650 MG tablet   Oral   Take 1,300 mg by mouth 2 (two) times daily.          . sorbitol 70 % solution   Oral   Take 30 mLs by mouth at bedtime as needed.         . warfarin (COUMADIN) 6 MG tablet   Oral   Take 6 mg by mouth daily.           BP 156/68  Pulse 89  Temp(Src) 97.6 F (36.4 C) (Oral)  Resp 20  Ht 5\' 4"  (1.626 m)  Wt 117 lb (53.071 kg)  BMI 20.07 kg/m2  SpO2 92% Physical Exam  Nursing note and vitals reviewed. Constitutional: She is oriented to person, place, and time. She appears well-developed and well-nourished.  Non-toxic appearance.  HENT:  Head: Normocephalic.  Right Ear: Tympanic membrane and external ear normal.  Left Ear: Tympanic membrane and external ear normal.  Eyes: EOM and lids are normal. Pupils are equal, round, and reactive to light.   Neck: Normal range of motion. Neck supple. Carotid bruit is not present.  Cardiovascular: Normal rate, regular rhythm, intact distal pulses and normal pulses.   Murmur heard. Pulmonary/Chest: Breath sounds normal. No respiratory distress.  Abdominal: Soft. Bowel sounds are normal. There is no tenderness. There is no guarding.  Musculoskeletal: Normal range of motion.  There is pain to palpation and attempted ROM of the left shoulder. No dislocation noted. No humerus deformity on limited exam. Good ROM of the fingers of the left hand. Cap refill less than 2 sec. Fingers warm. Splint in place.  Lymphadenopathy:       Head (right side): No submandibular adenopathy present.       Head (left side): No submandibular adenopathy present.    She has no cervical adenopathy.  Neurological: She is alert and oriented to person, place, and time. She has normal strength. No cranial nerve deficit or sensory deficit.  Skin: Skin is warm and dry.  Psychiatric: She has a normal mood and affect. Her speech is normal.    ED Course  Procedures (including critical care time) Labs Review Labs Reviewed - No data to display Imaging Review Dg Shoulder Left  05/06/2013   CLINICAL DATA:  Pain post fall.  EXAM: LEFT SHOULDER - 2+ VIEW  COMPARISON:  None.  FINDINGS: There is no evidence of fracture or dislocation. There is no evidence of arthropathy or other focal bone abnormality. Soft tissues are unremarkable. Atheromatous thoracic aorta.  IMPRESSION: Negative.   Electronically Signed   By: Oley Balm [name]   On: 05/06/2013 00:37   Dg Humerus Left  05/06/2013   CLINICAL DATA:  Pain post fall  EXAM: LEFT HUMERUS - 2+ VIEW  COMPARISON:  None.  FINDINGS: There is a transverse supracondylar fracture, with several mm anterior displacement and anterior angulation of the distal fracture fragment. No dislocation. Moderate elbow effusion. Proximal humerus unremarkable.  IMPRESSION: 1. Supracondylar humerus fracture with  mild displacement and angulation of distal fragment.   Electronically Signed   By: Oley Balm [name]   On: 05/06/2013 00:36    EKG Interpretation   None       MDM  No diagnosis found. **I have reviewed nursing notes, vital signs, and all appropriate lab and imaging results for this patient.*  Case discussed with Dr Preston Fleeting. Pt's splint reinforced with a sugar-tong to the uppeer arm. Pt given 2 percocet for pain. After these changes, pt is  now able to get up from the bet and walk in the room. She feels comfortable to go home  And see the hand ortho specialist as scheduled. Pt to return if any changes or problem.  Kathie Dike, PA-C 05/07/13 [phone]

## 2013-05-07 NOTE — Progress Notes (Signed)
ANTICOAGULATION CONSULT NOTE - Initial Consult  Pharmacy Consult for warfarin Indication: atrial fibrillation  No Known Allergies  Patient Measurements: Height: 5\' 5"  (165.1 cm) Weight: 115 lb (52.164 kg) IBW/kg (Calculated) : 57  Vital Signs: Temp: 98.1 F (36.7 C) (10/31 1441) Temp src: Oral (10/31 1441) BP: 80/59 mmHg (10/31 1913) Pulse Rate: 49 (10/31 1913)  Labs:  Recent Labs  05/07/13 1549  HGB 11.2*  HCT 35.0*  PLT 229  LABPROT 41.5*  INR 4.57*  CREATININE 4.42*  TROPONINI 1.64*    Estimated Creatinine Clearance: 8.1 ml/min (by C-G formula based on Cr of 4.42).   Medical History: Past Medical History  Diagnosis Date  . Anemia 03/06/2011  . Hypertension   . Coronary artery disease   . Renal disorder   . Aortic stenosis, mild   . A-fib   . Acute CHF     Medications:  See med history  Assessment: 25 yof presented to the ED with AMS s/p recent fall. INR is elevated at 4.57. H/H slightly and plts are WNL. No overt bleeding noted.   Goal of Therapy:  INR 2-3   Plan:  1. No coumadin tonight 2. Daily INR   Lekeya Rollings, Drake Leach 05/07/2013,8:43 PM

## 2013-05-08 DIAGNOSIS — I369 Nonrheumatic tricuspid valve disorder, unspecified: Secondary | ICD-10-CM

## 2013-05-08 DIAGNOSIS — I214 Non-ST elevation (NSTEMI) myocardial infarction: Secondary | ICD-10-CM

## 2013-05-08 DIAGNOSIS — T81718A Complication of other artery following a procedure, not elsewhere classified, initial encounter: Secondary | ICD-10-CM

## 2013-05-08 DIAGNOSIS — I729 Aneurysm of unspecified site: Secondary | ICD-10-CM

## 2013-05-08 DIAGNOSIS — N186 End stage renal disease: Secondary | ICD-10-CM

## 2013-05-08 DIAGNOSIS — I359 Nonrheumatic aortic valve disorder, unspecified: Secondary | ICD-10-CM

## 2013-05-08 HISTORY — DX: Complication of other artery following a procedure, not elsewhere classified, initial encounter: T81.718A

## 2013-05-08 HISTORY — PX: PSEUDOANEURYSM REPAIR: SHX2272

## 2013-05-08 HISTORY — DX: Aneurysm of unspecified site: I72.9

## 2013-05-08 LAB — URINE MICROSCOPIC-ADD ON

## 2013-05-08 LAB — CBC
HCT: 33.5 % — ABNORMAL LOW (ref 36.0–46.0)
MCH: 29.3 pg (ref 26.0–34.0)
Platelets: 196 10*3/uL (ref 150–400)
RBC: 3.68 MIL/uL — ABNORMAL LOW (ref 3.87–5.11)
RDW: 13.6 % (ref 11.5–15.5)
WBC: 8.7 10*3/uL (ref 4.0–10.5)

## 2013-05-08 LAB — CK TOTAL AND CKMB (NOT AT ARMC)
CK, MB: 13.5 ng/mL (ref 0.3–4.0)
Relative Index: 6.8 — ABNORMAL HIGH (ref 0.0–2.5)
Total CK: 200 U/L — ABNORMAL HIGH (ref 7–177)

## 2013-05-08 LAB — BASIC METABOLIC PANEL
Calcium: 8.6 mg/dL (ref 8.4–10.5)
Chloride: 96 mEq/L (ref 96–112)
Creatinine, Ser: 5.28 mg/dL — ABNORMAL HIGH (ref 0.50–1.10)
GFR calc Af Amer: 8 mL/min — ABNORMAL LOW (ref >90)
Glucose, Bld: 86 mg/dL (ref 70–99)
Sodium: 135 mEq/L (ref 135–145)

## 2013-05-08 LAB — URINALYSIS, ROUTINE W REFLEX MICROSCOPIC
Glucose, UA: NEGATIVE mg/dL
Nitrite: POSITIVE — AB
Protein, ur: >300 mg/dL — AB
Specific Gravity, Urine: 1.015 (ref 1.005–1.030)
Urobilinogen, UA: 0.2 mg/dL (ref 0.0–1.0)

## 2013-05-08 LAB — MRSA PCR SCREENING: MRSA by PCR: NEGATIVE

## 2013-05-08 LAB — PROTIME-INR: INR: 2.16 — ABNORMAL HIGH (ref 0.00–1.49)

## 2013-05-08 LAB — TSH: TSH: 5.516 u[IU]/mL — ABNORMAL HIGH (ref 0.350–4.500)

## 2013-05-08 LAB — TROPONIN I: Troponin I: 10.88 ng/mL (ref <0.30)

## 2013-05-08 MED ORDER — CIPROFLOXACIN IN D5W 400 MG/200ML IV SOLN
400.0000 mg | INTRAVENOUS | Status: DC
  Administered 2013-05-08 – 2013-05-09 (×2): 400 mg via INTRAVENOUS
  Filled 2013-05-08 (×3): qty 200

## 2013-05-08 MED ORDER — HYDROMORPHONE HCL 2 MG PO TABS: 1.0000 mg | ORAL_TABLET | ORAL | Status: DC | PRN

## 2013-05-08 MED ORDER — ASPIRIN 325 MG PO TABS
325.0000 mg | ORAL_TABLET | Freq: Every day | ORAL | Status: DC
  Administered 2013-05-08 – 2013-05-15 (×6): 325 mg via ORAL
  Filled 2013-05-08 (×10): qty 1

## 2013-05-08 MED ORDER — HYDROMORPHONE HCL PF 1 MG/ML IJ SOLN
0.5000 mg | INTRAMUSCULAR | Status: DC | PRN
  Administered 2013-05-08 – 2013-05-19 (×14): 0.5 mg via INTRAVENOUS
  Filled 2013-05-08 (×14): qty 1

## 2013-05-08 MED ORDER — ACETAMINOPHEN 325 MG PO TABS
650.0000 mg | ORAL_TABLET | Freq: Four times a day (QID) | ORAL | Status: DC | PRN
  Administered 2013-05-19 (×2): 650 mg via ORAL
  Filled 2013-05-08: qty 2

## 2013-05-08 MED ORDER — IBUPROFEN 200 MG PO TABS
200.0000 mg | ORAL_TABLET | Freq: Once | ORAL | Status: AC
  Administered 2013-05-08: 200 mg via ORAL
  Filled 2013-05-08 (×2): qty 1

## 2013-05-08 MED ORDER — HYDROMORPHONE HCL PF 1 MG/ML IJ SOLN
INTRAMUSCULAR | Status: AC
  Administered 2013-05-08: 0.5 mg via INTRAVENOUS
  Filled 2013-05-08: qty 1

## 2013-05-08 MED ORDER — HYDROCODONE-ACETAMINOPHEN 5-325 MG PO TABS
1.0000 | ORAL_TABLET | ORAL | Status: DC | PRN
  Administered 2013-05-08 – 2013-05-15 (×10): 1 via ORAL
  Filled 2013-05-08 (×10): qty 1

## 2013-05-08 NOTE — Progress Notes (Signed)
DAILY PROGRESS NOTE  Subjective:  Events noted overnight. Appreciate Dr. Norris Cross assistance. She has ruled-in for NSTEMI with increasing troponins, but does have ESRD on HD.  She has described heart fluttering recently, but no specific chest pain. She recently fell and suffered a supracondylar humerus fracture.  She has been in significant pain with this and possibly overdosed on percocet unintentionally.  She is very clear this morning. She had a-fib with RVR on admission and was on cardizem, but converted to NSR overnight.  BP stable this am. Only complaint is left arm pain, which she thinks is due to her splint being inadequately repositioned at dialysis Friday.  Objective:  Temp:  [97.8 F (36.6 C)-98.1 F (36.7 C)] 97.8 F (36.6 C) (11/01 0353) Pulse Rate:  [49-127] 80 (11/01 0600) Resp:  [11-21] 17 (11/01 0600) BP: (75-109)/(34-62) 109/47 mmHg (11/01 0600) SpO2:  [94 %-100 %] 97 % (11/01 0600) Weight:  [115 lb (52.164 kg)] 115 lb (52.164 kg) (10/31 1441) Weight change:   Intake/Output from previous day: 10/31 0701 - 11/01 0700 In: 266 [I.V.:66; IV Piggyback:200] Out: -   Intake/Output from this shift:    Medications: Current Facility-Administered Medications  Medication Dose Route Frequency Provider Last Rate Last Dose  . 0.9 %  sodium chloride infusion  250 mL Intravenous PRN Dorothea Ogle, MD   250 mL at 05/07/13 2200  . atorvastatin (LIPITOR) tablet 40 mg  40 mg Oral Daily Dorothea Ogle, MD   40 mg at 05/07/13 2145  . ciprofloxacin (CIPRO) IVPB 400 mg  400 mg Intravenous Q24H Abran Duke, RPH   400 mg at 05/08/13 0150  . diltiazem (CARDIZEM) 100 mg in dextrose 5 % 100 mL infusion  5-15 mg/hr Intravenous Titrated Dorothea Ogle, MD      . ondansetron [facility]) tablet 4 mg  4 mg Oral Q6H PRN Dorothea Ogle, MD       Or  . ondansetron [facility]) injection 4 mg  4 mg Intravenous Q6H PRN Dorothea Ogle, MD      . oxyCODONE-acetaminophen (PERCOCET/ROXICET) 5-325 MG per  tablet 1 tablet  1 tablet Oral Q4H PRN Dorothea Ogle, MD   1 tablet at 05/08/13 0405  . sevelamer carbonate (RENVELA) tablet 2,400 mg  2,400 mg Oral TID WC Dorothea Ogle, MD      . sodium bicarbonate tablet 1,300 mg  1,300 mg Oral BID Dorothea Ogle, MD   1,300 mg at 05/07/13 2237  . sodium chloride 0.9 % injection 3 mL  3 mL Intravenous Q12H Dorothea Ogle, MD      . sodium chloride 0.9 % injection 3 mL  3 mL Intravenous Q12H Dorothea Ogle, MD      . sodium chloride 0.9 % injection 3 mL  3 mL Intravenous PRN Dorothea Ogle, MD      . sorbitol 70 % solution 30 mL  30 mL Oral QHS PRN Dorothea Ogle, MD        Physical Exam: General appearance: alert and appears in mild pain Neck: no carotid bruit and no JVD Lungs: clear to auscultation bilaterally Heart: regular rate and rhythm, S1, S2 normal and systolic murmur: midsystolic 3/6, crescendo at 2nd right intercostal space Abdomen: soft, non-tender; bowel sounds normal; no masses,  no organomegaly Extremities: extremities normal, atraumatic, no cyanosis or edema. LUE fistula with +thrill Pulses: 2+ and symmetric Skin: Skin color, texture, turgor normal. No rashes or lesions Neurologic: Grossly normal Psych:  Appears in pain  Lab Results: Results for orders placed during the hospital encounter of 05/07/13 (from the past 48 hour(s))  CBC WITH DIFFERENTIAL     Status: Abnormal   Collection Time    05/07/13  3:49 PM      Result Value Range   WBC 8.0  4.0 - 10.5 K/uL   RBC 3.81 (*) 3.87 - 5.11 MIL/uL   Hemoglobin 11.2 (*) 12.0 - 15.0 g/dL   HCT 16.1 (*) 09.6 - 04.5 %   MCV 91.9  78.0 - 100.0 fL   MCH 29.4  26.0 - 34.0 pg   MCHC 32.0  30.0 - 36.0 g/dL   RDW 40.9  81.1 - 91.4 %   Platelets 229  150 - 400 K/uL   Neutrophils Relative % 71  43 - 77 %   Neutro Abs 5.6  1.7 - 7.7 K/uL   Lymphocytes Relative 18  12 - 46 %   Lymphs Abs 1.5  0.7 - 4.0 K/uL   Monocytes Relative 9  3 - 12 %   Monocytes Absolute 0.8  0.1 - 1.0 K/uL   Eosinophils  Relative 1  0 - 5 %   Eosinophils Absolute 0.1  0.0 - 0.7 K/uL   Basophils Relative 0  0 - 1 %   Basophils Absolute 0.0  0.0 - 0.1 K/uL  COMPREHENSIVE METABOLIC PANEL     Status: Abnormal   Collection Time    05/07/13  3:49 PM      Result Value Range   Sodium 135  135 - 145 mEq/L   Potassium 3.6  3.5 - 5.1 mEq/L   Chloride 94 (*) 96 - 112 mEq/L   CO2 27  19 - 32 mEq/L   Glucose, Bld 116 (*) 70 - 99 mg/dL   BUN 22  6 - 23 mg/dL   Creatinine, Ser 7.82 (*) 0.50 - 1.10 mg/dL   Calcium 8.7  8.4 - 95.6 mg/dL   Total Protein 6.9  6.0 - 8.3 g/dL   Albumin 3.2 (*) 3.5 - 5.2 g/dL   AST 18  0 - 37 U/L   ALT 7  0 - 35 U/L   Alkaline Phosphatase 52  39 - 117 U/L   Total Bilirubin 0.4  0.3 - 1.2 mg/dL   GFR calc non Af Amer 8 (*) >90 mL/min   GFR calc Af Amer 10 (*) >90 mL/min   Comment: (NOTE)     The eGFR has been calculated using the CKD EPI equation.     This calculation has not been validated in all clinical situations.     eGFR's persistently <90 mL/min signify possible Chronic Kidney     Disease.  TROPONIN I     Status: Abnormal   Collection Time    05/07/13  3:49 PM      Result Value Range   Troponin I 1.64 (*) <0.30 ng/mL   Comment:            Due to the release kinetics of cTnI,     a negative result within the first hours     of the onset of symptoms does not rule out     myocardial infarction with certainty.     If myocardial infarction is still suspected,     repeat the test at appropriate intervals.     REPEATED TO VERIFY     CRITICAL RESULT CALLED TO, READ BACK BY AND VERIFIED WITH:     K COBB,RN [id number]  05/07/13 WBOND  PROTIME-INR     Status: Abnormal   Collection Time    05/07/13  3:49 PM      Result Value Range   Prothrombin Time 41.5 (*) 11.6 - 15.2 seconds   INR 4.57 (*) 0.00 - 1.49  TROPONIN I     Status: Abnormal   Collection Time    05/07/13  8:41 PM      Result Value Range   Troponin I 2.93 (*) <0.30 ng/mL   Comment: REPEATED TO VERIFY     CRITICAL VALUE  NOTED.  VALUE IS CONSISTENT WITH PREVIOUSLY REPORTED AND CALLED VALUE.                Due to the release kinetics of cTnI,     a negative result within the first hours     of the onset of symptoms does not rule out     myocardial infarction with certainty.     If myocardial infarction is still suspected,     repeat the test at appropriate intervals.  TSH     Status: Abnormal   Collection Time    05/07/13  9:17 PM      Result Value Range   TSH 5.516 (*) 0.350 - 4.500 uIU/mL   Comment: Performed at [facility]  PROCALCITONIN     Status: None   Collection Time    05/07/13  9:18 PM      Result Value Range   Procalcitonin 0.21     Comment:            Interpretation:     PCT (Procalcitonin) <= 0.5 ng/mL:     Systemic infection (sepsis) is not likely.     Local bacterial infection is possible.     (NOTE)             ICU PCT Algorithm               Non ICU PCT Algorithm        ----------------------------     ------------------------------             PCT < 0.25 ng/mL                 PCT < 0.1 ng/mL         Stopping of antibiotics            Stopping of antibiotics           strongly encouraged.               strongly encouraged.        ----------------------------     ------------------------------           PCT level decrease by               PCT < 0.25 ng/mL           >= 80% from peak PCT           OR PCT 0.25 - 0.5 ng/mL          Stopping of antibiotics                                                 encouraged.         Stopping of antibiotics  encouraged.        ----------------------------     ------------------------------           PCT level decrease by              PCT >= 0.25 ng/mL           < 80% from peak PCT            AND PCT >= 0.5 ng/mL            Continuing antibiotics                                                  encouraged.           Continuing antibiotics                encouraged.        ----------------------------      ------------------------------         PCT level increase compared          PCT > 0.5 ng/mL             with peak PCT AND              PCT >= 0.5 ng/mL             Escalation of antibiotics                                              strongly encouraged.          Escalation of antibiotics            strongly encouraged.  MRSA PCR SCREENING     Status: None   Collection Time    05/07/13 10:30 PM      Result Value Range   MRSA by PCR NEGATIVE  NEGATIVE   Comment:            The GeneXpert MRSA Assay (FDA     approved for NASAL specimens     only), is one component of a     comprehensive MRSA colonization     surveillance program. It is not     intended to diagnose MRSA     infection nor to guide or     monitor treatment for     MRSA infections.  CK TOTAL AND CKMB     Status: Abnormal   Collection Time    05/07/13 11:03 PM      Result Value Range   Total CK 200 (*) 7 - 177 U/L   CK, MB 13.5 (*) 0.3 - 4.0 ng/mL   Comment: CRITICAL RESULT CALLED TO, READ BACK BY AND VERIFIED WITH:     JONES L,RN 05/08/13 0122 WAYK   Relative Index 6.8 (*) 0.0 - 2.5  URINALYSIS, ROUTINE W REFLEX MICROSCOPIC     Status: Abnormal   Collection Time    05/07/13 11:33 PM      Result Value Range   Color, Urine AMBER (*) YELLOW   Comment: BIOCHEMICALS MAY BE AFFECTED BY COLOR   APPearance TURBID (*) CLEAR   Specific Gravity, Urine 1.015  1.005 - 1.030   pH 7.0  5.0 - 8.0   Glucose, UA NEGATIVE  NEGATIVE mg/dL   Hgb urine dipstick MODERATE (*) NEGATIVE   Bilirubin Urine SMALL (*) NEGATIVE   Ketones, ur NEGATIVE  NEGATIVE mg/dL   Protein, ur >409 (*) NEGATIVE mg/dL   Urobilinogen, UA 0.2  0.0 - 1.0 mg/dL   Nitrite POSITIVE (*) NEGATIVE   Leukocytes, UA MODERATE (*) NEGATIVE  URINE MICROSCOPIC-ADD ON     Status: Abnormal   Collection Time    05/07/13 11:33 PM      Result Value Range   Squamous Epithelial / LPF MANY (*) RARE   WBC, UA 7-10  <3 WBC/hpf   RBC / HPF 3-6  <3 RBC/hpf   Bacteria, UA  MANY (*) RARE  TROPONIN I     Status: Abnormal   Collection Time    05/08/13  1:42 AM      Result Value Range   Troponin I 7.77 (*) <0.30 ng/mL   Comment:            Due to the release kinetics of cTnI,     a negative result within the first hours     of the onset of symptoms does not rule out     myocardial infarction with certainty.     If myocardial infarction is still suspected,     repeat the test at appropriate intervals.     REPEATED TO VERIFY     CRITICAL VALUE NOTED.  VALUE IS CONSISTENT WITH PREVIOUSLY REPORTED AND CALLED VALUE.  PROTIME-INR     Status: Abnormal   Collection Time    05/08/13  4:12 AM      Result Value Range   Prothrombin Time 23.4 (*) 11.6 - 15.2 seconds   INR 2.16 (*) 0.00 - 1.49  BASIC METABOLIC PANEL     Status: Abnormal   Collection Time    05/08/13  4:12 AM      Result Value Range   Sodium 135  135 - 145 mEq/L   Potassium 4.1  3.5 - 5.1 mEq/L   Chloride 96  96 - 112 mEq/L   CO2 24  19 - 32 mEq/L   Glucose, Bld 86  70 - 99 mg/dL   BUN 33 (*) 6 - 23 mg/dL   Comment: DELTA CHECK NOTED   Creatinine, Ser 5.28 (*) 0.50 - 1.10 mg/dL   Calcium 8.6  8.4 - 81.1 mg/dL   GFR calc non Af Amer 7 (*) >90 mL/min   GFR calc Af Amer 8 (*) >90 mL/min   Comment: (NOTE)     The eGFR has been calculated using the CKD EPI equation.     This calculation has not been validated in all clinical situations.     eGFR's persistently <90 mL/min signify possible Chronic Kidney     Disease.  CBC     Status: Abnormal   Collection Time    05/08/13  4:12 AM      Result Value Range   WBC 8.7  4.0 - 10.5 K/uL   RBC 3.68 (*) 3.87 - 5.11 MIL/uL   Hemoglobin 10.8 (*) 12.0 - 15.0 g/dL   HCT 91.4 (*) 78.2 - 95.6 %   MCV 91.0  78.0 - 100.0 fL   MCH 29.3  26.0 - 34.0 pg   MCHC 32.2  30.0 - 36.0 g/dL   RDW 21.3  08.6 - 57.8 %   Platelets 196  150 - 400 K/uL    Imaging: Dg Chest 2 View  05/07/2013   CLINICAL DATA:  Chest pain  and shortness of breath  EXAM: CHEST  2 VIEW   COMPARISON:  04/20/2013  FINDINGS: The cardiac shadow is mildly enlarged. The lungs are clear bilaterally. A stable compression deformity is seen in the upper thoracic spine. No other focal abnormality is noted.  IMPRESSION: No acute abnormality noted.   Electronically Signed   By: Alcide Clever [name]   On: 05/07/2013 17:17    Assessment:  1. Principal Problem: 2.   Altered mental status 3. Active Problems: 4.   Anemia 5.   HTN (hypertension) 6.   ESRD (end stage renal disease) on dialysis 7.   Long term (current) use of anticoagulants 8.   Atrial fibrillation with RVR 9.   NSTEMI (non-ST elevated myocardial infarction) 10.   Plan:  1. Mrs. Heckart appears to have had an NSTEMI, in the setting of recent humeral fracture, pain and possibly excess narcotic use. She is very clear today, but remains in pain in her arm. She denies chest pain. D/c percocet due to confusion, start po dilaudid with IV breakthrough. Will ask orthopedic tech to come and try to reposition splint. Will review 2D echo today. Agree with holding warfarin and starting heparin when INR <2.  Continue to trend troponin. Start Aspirin 325 mg daily - hold on thienopyridine due to possibility of 3 vessel CAD.  She has never had LHC and I'm recommending it on Monday - keep NPO p MN Sunday night. She is also on cipro for a presumed UTI.  Time Spent Directly with Patient:  15 minutes  Length of Stay:  LOS: 1 day   Chrystie Nose, MD, [facility] Attending Cardiologist [facility]  HILTY,Kenneth C 05/08/2013, 8:00 AM

## 2013-05-08 NOTE — Progress Notes (Signed)
CRITICAL VALUE ALERT  Critical value received:  CKMB 13.5  Date of notification:  05/08/2013  Time of notification:  0144  Critical value read back:yes  Nurse who received alert:  Gillis Ends  MD notified (1st page):  M. Lynch  Time of first page:  0142  MD notified (2nd page):  Time of second page:  Responding MD:  M.Lynch  Time MD responded:  [phone]

## 2013-05-08 NOTE — Progress Notes (Signed)
Edit for critical value sticker this nurse notified at [phone]

## 2013-05-08 NOTE — Progress Notes (Signed)
ANTICOAGULATION CONSULT NOTE - Initial Consult  Pharmacy Consult for Heparin (when INR <2) Indication: chest pain/ACS  No Known Allergies  Patient Measurements: Height: 5\' 5"  (165.1 cm) Weight: 115 lb (52.164 kg) IBW/kg (Calculated) : 57  Vital Signs: Temp: 98.3 F (36.8 C) (11/01 0800) Temp src: Oral (11/01 0800) BP: 112/47 mmHg (11/01 0800) Pulse Rate: 78 (11/01 0800)  Labs:  Recent Labs  05/07/13 1549 05/07/13 2041 05/07/13 2303 05/08/13 0142 05/08/13 0412  HGB 11.2*  --   --   --  10.8*  HCT 35.0*  --   --   --  33.5*  PLT 229  --   --   --  196  LABPROT 41.5*  --   --   --  23.4*  INR 4.57*  --   --   --  2.16*  CREATININE 4.42*  --   --   --  5.28*  CKTOTAL  --   --  200*  --   --   CKMB  --   --  13.5*  --   --   TROPONINI 1.64* 2.93*  --  7.77*  --     Estimated Creatinine Clearance: 6.8 ml/min (by C-G formula based on Cr of 5.28).   Medical History: Past Medical History  Diagnosis Date  . Anemia 03/06/2011  . Hypertension   . Coronary artery disease   . Renal disorder   . Aortic stenosis, mild   . A-fib   . Acute CHF     Medications:  Warfarin PTA  Assessment: 79 y/o F with rising troponin, to start heparin per pharmacy when INR <2. INR on admit was 4.57, 5mg  IV Vit K was given. INR 2.16 today.  Goal of Therapy:  Heparin level 0.3-0.7 units/ml Monitor platelets by anticoagulation protocol: Yes   Plan:  -Holding warfarin -Heparin when INR <2 -INR daily in the AM   Vinnie Level, PharmD.  [place] License #[id number] Application for [place] reciprocity pending  Clinical Pharmacist Pager [phone]   I have read and agree with the note as written above. Follow up tomorrow. Likely will be able to start heparin at that point as INR fell nicely after vitamin K.  Kentrail Shew D. Gwenyth Dingee, PharmD Clinical Pharmacist Pager: [phone] 05/08/2013 10:19 AM

## 2013-05-08 NOTE — Progress Notes (Signed)
ANTIBIOTIC CONSULT NOTE - INITIAL  Pharmacy Consult for Ciprofloxacin Indication: UTI  No Known Allergies  Patient Measurements: Height: 5\' 5"  (165.1 cm) Weight: 115 lb (52.164 kg) IBW/kg (Calculated) : 57  Vital Signs: Temp: 98 F (36.7 C) (10/31 2355) Temp src: Oral (10/31 2355) BP: 89/44 mmHg (11/01 0102) Pulse Rate: 88 (11/01 0102) Intake/Output from previous day: 10/31 0701 - 11/01 0700 In: 16 [I.V.:16] Out: -  Intake/Output from this shift: Total I/O In: 16 [I.V.:16] Out: -   Labs:  Recent Labs  05/07/13 1549  WBC 8.0  HGB 11.2*  PLT 229  CREATININE 4.42*   Estimated Creatinine Clearance: 8.1 ml/min (by C-G formula based on Cr of 4.42). No results found for this basename: VANCOTROUGH, VANCOPEAK, VANCORANDOM, GENTTROUGH, GENTPEAK, GENTRANDOM, TOBRATROUGH, TOBRAPEAK, TOBRARND, AMIKACINPEAK, AMIKACINTROU, AMIKACIN,  in the last 72 hours   Medical History: Past Medical History  Diagnosis Date  . Anemia 03/06/2011  . Hypertension   . Coronary artery disease   . Renal disorder   . Aortic stenosis, mild   . A-fib   . Acute CHF    Assessment: 79 y/o with abnormal UA to start Cipro. CrCl <10. WBC wnl, afebrile. Noted DDI with warfarin; warfarin currently on hold.   Goal of Therapy:  Clinical resolution   Plan:  -Ciprofloxacin 400 mg IV q24h -Trend WBC, temp, renal function -F/U urine cx, change to PO  Thank you for allowing me to take part in this patient's care,  Abran Duke, PharmD Clinical Pharmacist Phone: [phone] Pager: [phone] 05/08/2013 1:19 AM

## 2013-05-08 NOTE — Progress Notes (Signed)
  Echocardiogram 2D Echocardiogram has been performed.  Rhonda Linan 05/08/2013, 11:03 AM

## 2013-05-08 NOTE — Progress Notes (Signed)
[facility] Progress Note [place] TEAM 1 - Stepdown/ICU TEAM   [name] WUJ:[id number] DOB: 1927-08-22 DOA: 05/07/2013 PCP: [name], MD  Admit HPI / Brief Narrative: 79 yo female with history of atrial fibrillation on Coumadin, HTN, HLD, ESRD on HD MWF, grade II diastolic dysfunction per last 2 D ECHO 11/2012, with recent fall at home and with subsequent supracondylar humerus fracture with mild displacement and angulation of distal fragment, who was brought to [facility] ED after found to be more confused at home after she tool 2 tablets of percocet for pain. She apparently took her pain meds after HD then family noted she was somewhat confused. Per ED physician, pt became more alert compared to mental status on arrival.  Assessment/Plan:  Altered mental status Likely due to percocet being used for arm pain - adjust pain regimen and follow - will check B12 and folic acid - MS appears to have returned to baseline at this time   ESRD on HD M/W/F Nephrology to be consulted to continue usual HD   NSTEMI Cardiology following - for probable cath early next week  Atrial fibrillation w/ RVR on chronic Coumadin Transition to heparin once INR decreased - converted back to NSR over night  Grade II diastolic dysfunction Well compensated at present   Supracondylar humerus fracture with mild displacement and angulation of distal fragment s/p fall 10/30 To f/u w/ Orthopedics as outpt as previously arranged as an outpt   Anemia of chronic renal disease  Follow Hgb trend - no evidence of blood loss at present   HTN BP is will controlled at this time/trending to mild hypotension  UTI Cont cipro empirically - f/u urine culture   HLD  Code Status: FULL Family Communication: spoke w/ pt and 2 daughters at bedside  Disposition Plan: SDU  Consultants: Cardiology Nephrology  Procedures: none  Antibiotics: Cipro 10/31 >>  DVT prophylaxis: lovenox  HPI/Subjective: Pt is  resting comfortably in bed. She denies cp, sob, f/c, n/v, or abdom pain.  The pain in her L arm is currently well controlled after IV pain meds.  Objective: Blood pressure 112/47, pulse 78, temperature 98.3 F (36.8 C), temperature source Oral, resp. rate 15, height 5\' 5"  (1.651 m), weight 52.164 kg (115 lb), SpO2 96.00%.  Intake/Output Summary (Last 24 hours) at 05/08/13 1001 Last data filed at 05/08/13 0400  Gross per 24 hour  Intake    266 ml  Output      0 ml  Net    266 ml   Exam: General: No acute respiratory distress Lungs: Clear to auscultation bilaterally without wheezes or crackles Cardiovascular: Regular rate without murmur gallop or rub normal S1 and S2 Abdomen: Nontender, nondistended, soft, bowel sounds positive, no rebound, no ascites, no appreciable mass Extremities: No significant cyanosis, clubbing, or edema bilateral lower extremities - L UE in splint   Data Reviewed: Basic Metabolic Panel:  Recent Labs Lab 05/07/13 1549 05/08/13 0412  NA 135 135  K 3.6 4.1  CL 94* 96  CO2 27 24  GLUCOSE 116* 86  BUN 22 33*  CREATININE 4.42* 5.28*  CALCIUM 8.7 8.6   Liver Function Tests:  Recent Labs Lab 05/07/13 1549  AST 18  ALT 7  ALKPHOS 52  BILITOT 0.4  PROT 6.9  ALBUMIN 3.2*   CBC:  Recent Labs Lab 05/07/13 1549 05/08/13 0412  WBC 8.0 8.7  NEUTROABS 5.6  --   HGB 11.2* 10.8*  HCT 35.0* 33.5*  MCV 91.9  91.0  PLT 229 196   Cardiac Enzymes:  Recent Labs Lab 05/07/13 1549 05/07/13 2041 05/07/13 2303 05/08/13 0142  CKTOTAL  --   --  200*  --   CKMB  --   --  13.5*  --   TROPONINI 1.64* 2.93*  --  7.77*    Recent Results (from the past 240 hour(s))  MRSA PCR SCREENING     Status: None   Collection Time    05/07/13 10:30 PM      Result Value Range Status   MRSA by PCR NEGATIVE  NEGATIVE Final   Comment:            The GeneXpert MRSA Assay (FDA     approved for NASAL specimens     only), is one component of a     comprehensive MRSA  colonization     surveillance program. It is not     intended to diagnose MRSA     infection nor to guide or     monitor treatment for     MRSA infections.     Studies:  Recent x-ray studies have been reviewed in detail by the Attending Physician  Scheduled Meds:  Scheduled Meds: . aspirin  325 mg Oral Daily  . atorvastatin  40 mg Oral Daily  . ciprofloxacin  400 mg Intravenous Q24H  . sevelamer carbonate  2,400 mg Oral TID WC  . sodium bicarbonate  1,300 mg Oral BID  . sodium chloride  3 mL Intravenous Q12H  . sodium chloride  3 mL Intravenous Q12H    Time spent on care of this patient: 35 mins   [facility]  [facility] Office  [phone] Pager - Text Page per Loretha Stapler as per below:  On-Call/Text Page:      [email or web address]      password [email or web address]  If 7PM-7AM, please contact night-coverage [email or web address] Password [email or web address] 05/08/2013, 10:01 AM   LOS: 1 day

## 2013-05-09 DIAGNOSIS — N390 Urinary tract infection, site not specified: Secondary | ICD-10-CM | POA: Diagnosis present

## 2013-05-09 LAB — BASIC METABOLIC PANEL
BUN: 48 mg/dL — ABNORMAL HIGH (ref 6–23)
CO2: 25 mEq/L (ref 19–32)
Calcium: 8.4 mg/dL (ref 8.4–10.5)
GFR calc non Af Amer: 5 mL/min — ABNORMAL LOW (ref >90)
Potassium: 4.3 mEq/L (ref 3.5–5.1)
Sodium: 134 mEq/L — ABNORMAL LOW (ref 135–145)

## 2013-05-09 LAB — CBC
HCT: 31 % — ABNORMAL LOW (ref 36.0–46.0)
Hemoglobin: 10 g/dL — ABNORMAL LOW (ref 12.0–15.0)
MCH: 29.2 pg (ref 26.0–34.0)
MCV: 90.4 fL (ref 78.0–100.0)
Platelets: 201 10*3/uL (ref 150–400)
RBC: 3.43 MIL/uL — ABNORMAL LOW (ref 3.87–5.11)
WBC: 7.8 10*3/uL (ref 4.0–10.5)

## 2013-05-09 LAB — PROTIME-INR: INR: 1.74 — ABNORMAL HIGH (ref 0.00–1.49)

## 2013-05-09 LAB — HEPARIN LEVEL (UNFRACTIONATED): Heparin Unfractionated: <0.1 IU/mL — ABNORMAL LOW (ref 0.30–0.70)

## 2013-05-09 MED ORDER — NEPRO/CARBSTEADY PO LIQD
237.0000 mL | ORAL | Status: DC | PRN
  Filled 2013-05-09: qty 237

## 2013-05-09 MED ORDER — SODIUM CHLORIDE 0.9 % IV SOLN: 100.0000 mL | INTRAVENOUS | Status: DC | PRN

## 2013-05-09 MED ORDER — SODIUM CHLORIDE 0.9 % IJ SOLN
3.0000 mL | Freq: Two times a day (BID) | INTRAMUSCULAR | Status: DC
  Administered 2013-05-10: 21:00:00 via INTRAVENOUS
  Administered 2013-05-10: 3 mL via INTRAVENOUS

## 2013-05-09 MED ORDER — HEPARIN (PORCINE) IN NACL 100-0.45 UNIT/ML-% IJ SOLN
600.0000 [IU]/h | INTRAMUSCULAR | Status: DC
  Administered 2013-05-09: 600 [IU]/h via INTRAVENOUS
  Filled 2013-05-09: qty 250

## 2013-05-09 MED ORDER — LIDOCAINE-PRILOCAINE 2.5-2.5 % EX CREA
1.0000 "application " | TOPICAL_CREAM | CUTANEOUS | Status: DC | PRN
  Filled 2013-05-09: qty 5

## 2013-05-09 MED ORDER — ASPIRIN 81 MG PO CHEW
81.0000 mg | CHEWABLE_TABLET | ORAL | Status: AC
  Administered 2013-05-10: 81 mg via ORAL
  Filled 2013-05-09: qty 1

## 2013-05-09 MED ORDER — OXYCODONE HCL 5 MG PO TABS
5.0000 mg | ORAL_TABLET | ORAL | Status: DC | PRN
  Administered 2013-05-09 – 2013-05-11 (×10): 5 mg via ORAL
  Filled 2013-05-09 (×4): qty 1
  Filled 2013-05-09: qty 2
  Filled 2013-05-09 (×5): qty 1

## 2013-05-09 MED ORDER — HEPARIN (PORCINE) IN NACL 100-0.45 UNIT/ML-% IJ SOLN
950.0000 [IU]/h | INTRAMUSCULAR | Status: DC
  Administered 2013-05-09: 800 [IU]/h via INTRAVENOUS
  Administered 2013-05-10: 950 [IU]/h via INTRAVENOUS
  Administered 2013-05-10: 1050 [IU]/h via INTRAVENOUS
  Administered 2013-05-10: 950 [IU]/h via INTRAVENOUS
  Filled 2013-05-09: qty 250

## 2013-05-09 MED ORDER — SODIUM CHLORIDE 0.9 % IV SOLN: 250.0000 mL | INTRAVENOUS | Status: DC | PRN

## 2013-05-09 MED ORDER — ALTEPLASE 2 MG IJ SOLR
2.0000 mg | Freq: Once | INTRAMUSCULAR | Status: AC | PRN
  Filled 2013-05-09: qty 2

## 2013-05-09 MED ORDER — TRAMADOL HCL 50 MG PO TABS
50.0000 mg | ORAL_TABLET | Freq: Four times a day (QID) | ORAL | Status: DC | PRN
Start: 2013-05-09 — End: 2013-05-22
  Administered 2013-05-16 – 2013-05-22 (×9): 50 mg via ORAL
  Filled 2013-05-09 (×9): qty 1

## 2013-05-09 MED ORDER — SODIUM CHLORIDE 0.9 % IJ SOLN: 3.0000 mL | INTRAMUSCULAR | Status: DC | PRN

## 2013-05-09 MED ORDER — DEXTROSE 5 % IV SOLN
1.0000 g | INTRAVENOUS | Status: AC
  Administered 2013-05-09 – 2013-05-19 (×9): 1 g via INTRAVENOUS
  Filled 2013-05-09 (×11): qty 10

## 2013-05-09 MED ORDER — HEPARIN SODIUM (PORCINE) 1000 UNIT/ML DIALYSIS: 1000.0000 [IU] | INTRAMUSCULAR | Status: DC | PRN

## 2013-05-09 MED ORDER — PENTAFLUOROPROP-TETRAFLUOROETH EX AERO: 1.0000 "application " | INHALATION_SPRAY | CUTANEOUS | Status: DC | PRN

## 2013-05-09 MED ORDER — SODIUM CHLORIDE 0.9 % IV SOLN
1.0000 mL/kg/h | INTRAVENOUS | Status: DC
  Administered 2013-05-10: 1 mL/kg/h via INTRAVENOUS

## 2013-05-09 MED ORDER — LIDOCAINE HCL (PF) 1 % IJ SOLN: 5.0000 mL | INTRAMUSCULAR | Status: DC | PRN

## 2013-05-09 NOTE — Progress Notes (Signed)
Utilization Review Completed.[name] T11/08/2012  

## 2013-05-09 NOTE — Consult Note (Signed)
[facility] Renal Consultation Note  Indication for Consultation:  Management of ESRD/hemodialysis; anemia, hypertension/volume and secondary hyperparathyroidism  HPI: [name] is a 79 y.o. female with a history of atrial fibrillation on Coumadin and ESRD on dialysis on MWF at [facility] in [place] who was seen at the [facility] ED 10/29 for a left supracondylar humerus fracture with mild displacement and angulation of distal fragment, secondary to a fall, and was referred to a hand specialist, but came to [facility] 10/31 with altered mental status after taking two Percocets for pain and was found to be in atrial fibrillation with RVR and elevated troponins.  She was seen by Cardiology and has converted to normal sinus rhythm on IV Cardizem, but her troponin peaked at 11.55 and is currently trending down.  She denies any chest pain or discomfort or dyspnea, but reports frequent left arm pain, secondary to the fracture, especially during dialysis on 10/31, when the ACE wrap and sling were adjusted for access to her left upper arm fistula.  Her mental status has improved and is likely at baseline. She is scheduled for left heart catheterization tomorrow.  When patient was seen by me, is in NAD, family at bedside.   Dialysis Orders:   MWF @ [facility] in [place] 3 hrs    400/800     Heparin 0     AVF @ LUA (buttonhole using 15G needles, both up)  Other details and medications will be obtained tomorrow from the center.   Past Medical History  Diagnosis Date  . Anemia 03/06/2011  . Hypertension   . Coronary artery disease   . Renal disorder   . Aortic stenosis, mild   . A-fib   . Acute CHF    Past Surgical History  Procedure Laterality Date  . Av fistula placement      L arm  . Other surgical history Bilateral     Surgery on both arms.   . Fracture surgery  Bil. Arms   No family history on file.  Social History She denies any history of tobacco, alcohol, or  illicit drug use.  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  No Known Allergies Prior to Admission medications   Medication Sig Start Date End Date Taking? Authorizing Provider  atorvastatin (LIPITOR) 40 MG tablet Take 40 mg by mouth daily.     Yes Historical Provider, MD  diltiazem (DILACOR XR) 120 MG 24 hr capsule Take 120 mg by mouth daily.   Yes Historical Provider, MD  labetalol (NORMODYNE) 200 MG tablet Take 200 mg by mouth 2 (two) times daily.     Yes Historical Provider, MD  oxyCODONE-acetaminophen (PERCOCET/ROXICET) 5-325 MG per tablet Take 1 tablet by mouth every 4 (four) hours as needed for pain. 05/06/13  Yes Lyanne Co, MD  sevelamer carbonate (RENVELA) 800 MG tablet Take 2,400 mg by mouth 3 (three) times daily with meals.   Yes Historical Provider, MD  sodium bicarbonate 650 MG tablet Take 1,300 mg by mouth 2 (two) times daily.    Yes Historical Provider, MD  sorbitol 70 % solution Take 30 mLs by mouth at bedtime as needed.   Yes Historical Provider, MD  warfarin (COUMADIN) 6 MG tablet Take 6 mg by mouth daily.    Yes Historical Provider, MD   Labs:  Results for orders placed during the hospital encounter of 05/07/13 (from the past 48 hour(s))  CBC WITH DIFFERENTIAL  Status: Abnormal   Collection Time    05/07/13  3:49 PM      Result Value Range   WBC 8.0  4.0 - 10.5 K/uL   RBC 3.81 (*) 3.87 - 5.11 MIL/uL   Hemoglobin 11.2 (*) 12.0 - 15.0 g/dL   HCT 28.4 (*) 13.2 - 44.0 %   MCV 91.9  78.0 - 100.0 fL   MCH 29.4  26.0 - 34.0 pg   MCHC 32.0  30.0 - 36.0 g/dL   RDW 10.2  72.5 - 36.6 %   Platelets 229  150 - 400 K/uL   Neutrophils Relative % 71  43 - 77 %   Neutro Abs 5.6  1.7 - 7.7 K/uL   Lymphocytes Relative 18  12 - 46 %   Lymphs Abs 1.5  0.7 - 4.0 K/uL   Monocytes Relative 9  3 - 12 %   Monocytes Absolute 0.8  0.1 - 1.0 K/uL   Eosinophils Relative 1  0 - 5 %   Eosinophils Absolute 0.1  0.0 - 0.7 K/uL   Basophils Relative 0  0 - 1 %    Basophils Absolute 0.0  0.0 - 0.1 K/uL  COMPREHENSIVE METABOLIC PANEL     Status: Abnormal   Collection Time    05/07/13  3:49 PM      Result Value Range   Sodium 135  135 - 145 mEq/L   Potassium 3.6  3.5 - 5.1 mEq/L   Chloride 94 (*) 96 - 112 mEq/L   CO2 27  19 - 32 mEq/L   Glucose, Bld 116 (*) 70 - 99 mg/dL   BUN 22  6 - 23 mg/dL   Creatinine, Ser 4.40 (*) 0.50 - 1.10 mg/dL   Calcium 8.7  8.4 - 34.7 mg/dL   Total Protein 6.9  6.0 - 8.3 g/dL   Albumin 3.2 (*) 3.5 - 5.2 g/dL   AST 18  0 - 37 U/L   ALT 7  0 - 35 U/L   Alkaline Phosphatase 52  39 - 117 U/L   Total Bilirubin 0.4  0.3 - 1.2 mg/dL   GFR calc non Af Amer 8 (*) >90 mL/min   GFR calc Af Amer 10 (*) >90 mL/min   Comment: (NOTE)     The eGFR has been calculated using the CKD EPI equation.     This calculation has not been validated in all clinical situations.     eGFR's persistently <90 mL/min signify possible Chronic Kidney     Disease.  TROPONIN I     Status: Abnormal   Collection Time    05/07/13  3:49 PM      Result Value Range   Troponin I 1.64 (*) <0.30 ng/mL   Comment:            Due to the release kinetics of cTnI,     a negative result within the first hours     of the onset of symptoms does not rule out     myocardial infarction with certainty.     If myocardial infarction is still suspected,     repeat the test at appropriate intervals.     REPEATED TO VERIFY     CRITICAL RESULT CALLED TO, READ BACK BY AND VERIFIED WITH:     K COBB,RN 1658 05/07/13 WBOND  PROTIME-INR     Status: Abnormal   Collection Time    05/07/13  3:49 PM      Result Value Range  Prothrombin Time 41.5 (*) 11.6 - 15.2 seconds   INR 4.57 (*) 0.00 - 1.49  TROPONIN I     Status: Abnormal   Collection Time    05/07/13  8:41 PM      Result Value Range   Troponin I 2.93 (*) <0.30 ng/mL   Comment: REPEATED TO VERIFY     CRITICAL VALUE NOTED.  VALUE IS CONSISTENT WITH PREVIOUSLY REPORTED AND CALLED VALUE.                Due to the  release kinetics of cTnI,     a negative result within the first hours     of the onset of symptoms does not rule out     myocardial infarction with certainty.     If myocardial infarction is still suspected,     repeat the test at appropriate intervals.  TSH     Status: Abnormal   Collection Time    05/07/13  9:17 PM      Result Value Range   TSH 5.516 (*) 0.350 - 4.500 uIU/mL   Comment: Performed at [facility]  PROCALCITONIN     Status: None   Collection Time    05/07/13  9:18 PM      Result Value Range   Procalcitonin 0.21     Comment:            Interpretation:     PCT (Procalcitonin) <= 0.5 ng/mL:     Systemic infection (sepsis) is not likely.     Local bacterial infection is possible.     (NOTE)             ICU PCT Algorithm               Non ICU PCT Algorithm        ----------------------------     ------------------------------             PCT < 0.25 ng/mL                 PCT < 0.1 ng/mL         Stopping of antibiotics            Stopping of antibiotics           strongly encouraged.               strongly encouraged.        ----------------------------     ------------------------------           PCT level decrease by               PCT < 0.25 ng/mL           >= 80% from peak PCT           OR PCT 0.25 - 0.5 ng/mL          Stopping of antibiotics                                                 encouraged.         Stopping of antibiotics               encouraged.        ----------------------------     ------------------------------           PCT level decrease by  PCT >= 0.25 ng/mL           < 80% from peak PCT            AND PCT >= 0.5 ng/mL            Continuing antibiotics                                                  encouraged.           Continuing antibiotics                encouraged.        ----------------------------     ------------------------------         PCT level increase compared          PCT > 0.5 ng/mL             with  peak PCT AND              PCT >= 0.5 ng/mL             Escalation of antibiotics                                              strongly encouraged.          Escalation of antibiotics            strongly encouraged.  MRSA PCR SCREENING     Status: None   Collection Time    05/07/13 10:30 PM      Result Value Range   MRSA by PCR NEGATIVE  NEGATIVE   Comment:            The GeneXpert MRSA Assay (FDA     approved for NASAL specimens     only), is one component of a     comprehensive MRSA colonization     surveillance program. It is not     intended to diagnose MRSA     infection nor to guide or     monitor treatment for     MRSA infections.  CK TOTAL AND CKMB     Status: Abnormal   Collection Time    05/07/13 11:03 PM      Result Value Range   Total CK 200 (*) 7 - 177 U/L   CK, MB 13.5 (*) 0.3 - 4.0 ng/mL   Comment: CRITICAL RESULT CALLED TO, READ BACK BY AND VERIFIED WITH:     Yetta Barre L,RN 05/08/13 0122 WAYK   Relative Index 6.8 (*) 0.0 - 2.5  URINALYSIS, ROUTINE W REFLEX MICROSCOPIC     Status: Abnormal   Collection Time    05/07/13 11:33 PM      Result Value Range   Color, Urine AMBER (*) YELLOW   Comment: BIOCHEMICALS MAY BE AFFECTED BY COLOR   APPearance TURBID (*) CLEAR   Specific Gravity, Urine 1.015  1.005 - 1.030   pH 7.0  5.0 - 8.0   Glucose, UA NEGATIVE  NEGATIVE mg/dL   Hgb urine dipstick MODERATE (*) NEGATIVE   Bilirubin Urine SMALL (*) NEGATIVE   Ketones, ur NEGATIVE  NEGATIVE mg/dL   Protein, ur >147 (*) NEGATIVE mg/dL   Urobilinogen, UA 0.2  0.0 -  1.0 mg/dL   Nitrite POSITIVE (*) NEGATIVE   Leukocytes, UA MODERATE (*) NEGATIVE  URINE MICROSCOPIC-ADD ON     Status: Abnormal   Collection Time    05/07/13 11:33 PM      Result Value Range   Squamous Epithelial / LPF MANY (*) RARE   WBC, UA 7-10  <3 WBC/hpf   RBC / HPF 3-6  <3 RBC/hpf   Bacteria, UA MANY (*) RARE  URINE CULTURE     Status: None   Collection Time    05/07/13 11:34 PM      Result Value Range    Specimen Description URINE, RANDOM     Special Requests NONE     Culture  Setup Time       Value: 05/08/2013 04:05     Performed at [facility] Count       Value: >=100,000 COLONIES/ML     Performed at [facility]   Culture       Value: ESCHERICHIA COLI     Performed at [facility]   Report Status PENDING    TROPONIN I     Status: Abnormal   Collection Time    05/08/13  1:42 AM      Result Value Range   Troponin I 7.77 (*) <0.30 ng/mL   Comment:            Due to the release kinetics of cTnI,     a negative result within the first hours     of the onset of symptoms does not rule out     myocardial infarction with certainty.     If myocardial infarction is still suspected,     repeat the test at appropriate intervals.     REPEATED TO VERIFY     CRITICAL VALUE NOTED.  VALUE IS CONSISTENT WITH PREVIOUSLY REPORTED AND CALLED VALUE.  PROTIME-INR     Status: Abnormal   Collection Time    05/08/13  4:12 AM      Result Value Range   Prothrombin Time 23.4 (*) 11.6 - 15.2 seconds   INR 2.16 (*) 0.00 - 1.49  BASIC METABOLIC PANEL     Status: Abnormal   Collection Time    05/08/13  4:12 AM      Result Value Range   Sodium 135  135 - 145 mEq/L   Potassium 4.1  3.5 - 5.1 mEq/L   Chloride 96  96 - 112 mEq/L   CO2 24  19 - 32 mEq/L   Glucose, Bld 86  70 - 99 mg/dL   BUN 33 (*) 6 - 23 mg/dL   Comment: DELTA CHECK NOTED   Creatinine, Ser 5.28 (*) 0.50 - 1.10 mg/dL   Calcium 8.6  8.4 - 57.8 mg/dL   GFR calc non Af Amer 7 (*) >90 mL/min   GFR calc Af Amer 8 (*) >90 mL/min   Comment: (NOTE)     The eGFR has been calculated using the CKD EPI equation.     This calculation has not been validated in all clinical situations.     eGFR's persistently <90 mL/min signify possible Chronic Kidney     Disease.  CBC     Status: Abnormal   Collection Time    05/08/13  4:12 AM      Result Value Range   WBC 8.7  4.0 - 10.5 K/uL   RBC 3.68 (*) 3.87 - 5.11  MIL/uL   Hemoglobin 10.8 (*)  12.0 - 15.0 g/dL   HCT 81.1 (*) 91.4 - 78.2 %   MCV 91.0  78.0 - 100.0 fL   MCH 29.3  26.0 - 34.0 pg   MCHC 32.2  30.0 - 36.0 g/dL   RDW 95.6  21.3 - 08.6 %   Platelets 196  150 - 400 K/uL  TROPONIN I     Status: Abnormal   Collection Time    05/08/13  2:20 PM      Result Value Range   Troponin I 11.55 (*) <0.30 ng/mL   Comment:            Due to the release kinetics of cTnI,     a negative result within the first hours     of the onset of symptoms does not rule out     myocardial infarction with certainty.     If myocardial infarction is still suspected,     repeat the test at appropriate intervals.     CRITICAL VALUE NOTED.  VALUE IS CONSISTENT WITH PREVIOUSLY REPORTED AND CALLED VALUE.     REPEATED TO VERIFY  TROPONIN I     Status: Abnormal   Collection Time    05/08/13  7:58 PM      Result Value Range   Troponin I 10.88 (*) <0.30 ng/mL   Comment:            Due to the release kinetics of cTnI,     a negative result within the first hours     of the onset of symptoms does not rule out     myocardial infarction with certainty.     If myocardial infarction is still suspected,     repeat the test at appropriate intervals.     CRITICAL VALUE NOTED.  VALUE IS CONSISTENT WITH PREVIOUSLY REPORTED AND CALLED VALUE.     REPEATED TO VERIFY  TROPONIN I     Status: Abnormal   Collection Time    05/09/13  2:00 AM      Result Value Range   Troponin I 7.12 (*) <0.30 ng/mL   Comment:            Due to the release kinetics of cTnI,     a negative result within the first hours     of the onset of symptoms does not rule out     myocardial infarction with certainty.     If myocardial infarction is still suspected,     repeat the test at appropriate intervals.     REPEATED TO VERIFY     CRITICAL VALUE NOTED.  VALUE IS CONSISTENT WITH PREVIOUSLY REPORTED AND CALLED VALUE.  PROTIME-INR     Status: Abnormal   Collection Time    05/09/13  2:00 AM       Result Value Range   Prothrombin Time 19.8 (*) 11.6 - 15.2 seconds   INR 1.74 (*) 0.00 - 1.49  BASIC METABOLIC PANEL     Status: Abnormal   Collection Time    05/09/13  2:00 AM      Result Value Range   Sodium 134 (*) 135 - 145 mEq/L   Potassium 4.3  3.5 - 5.1 mEq/L   Chloride 93 (*) 96 - 112 mEq/L   CO2 25  19 - 32 mEq/L   Glucose, Bld 77  70 - 99 mg/dL   BUN 48 (*) 6 - 23 mg/dL   Creatinine, Ser 5.78 (*) 0.50 - 1.10 mg/dL   Calcium  8.4  8.4 - 10.5 mg/dL   GFR calc non Af Amer 5 (*) >90 mL/min   GFR calc Af Amer 6 (*) >90 mL/min   Comment: (NOTE)     The eGFR has been calculated using the CKD EPI equation.     This calculation has not been validated in all clinical situations.     eGFR's persistently <90 mL/min signify possible Chronic Kidney     Disease.  CBC     Status: Abnormal   Collection Time    05/09/13  2:00 AM      Result Value Range   WBC 7.8  4.0 - 10.5 K/uL   RBC 3.43 (*) 3.87 - 5.11 MIL/uL   Hemoglobin 10.0 (*) 12.0 - 15.0 g/dL   HCT 29.5 (*) 62.1 - 30.8 %   MCV 90.4  78.0 - 100.0 fL   MCH 29.2  26.0 - 34.0 pg   MCHC 32.3  30.0 - 36.0 g/dL   RDW 65.7  84.6 - 96.2 %   Platelets 201  150 - 400 K/uL   Constitutional: negative for chills, fatigue, fevers and sweats Ears, nose, mouth, throat, and face: negative for earaches, hoarseness, nasal congestion and sore throat Respiratory: negative for cough, dyspnea on exertion, hemoptysis and sputum Cardiovascular: negative for chest pain, chest pressure/discomfort, dyspnea, orthopnea and palpitations Gastrointestinal: negative for abdominal pain, change in bowel habits, nausea and vomiting Genitourinary:negative, oliguric Musculoskeletal:positive for left elbow pain, negative for back pain, myalgias and neck pain Neurological: negative for dizziness, headaches, paresthesia and speech problems  Physical Exam: Filed Vitals:   05/09/13 0800  BP: 147/57  Pulse: 74  Temp:   Resp: 15     General appearance: alert,  cooperative and no distress Head: Normocephalic, without obvious abnormality, atraumatic Neck: no adenopathy, no carotid bruit, no JVD and supple, symmetrical, trachea midline Resp: clear to auscultation bilaterally Cardio: RRR with Gr III/VI systolic murmur, no rub GI: soft, non-tender; bowel sounds normal; no masses,  no organomegaly Extremities: left arm with ACE wrap and sling, lower extremities with no edema Neurologic: Grossly normal Dialysis Access: AVF @ LUA with + bruit (under ACE)   Assessment/Plan: 1. AMS - likely sec to pain meds, now @ baseline. 2. A-fib with RVR - now in NSR on Cardizem IV; Hx previously on Coumadin, now on Heparin per pharmacy. 3. NSTEMI - no chest pain, but elevated troponin (peaked at 11.55, now 7.12); echo yesterday showed preserved LVEF (60-65%), but moderate AS and mild mitral disease.  Cardiac cath tomorrow. Per cards 4. Left elbow fx - supracondylar humerus fx with anterior displacement and angulation of distal fx; on pain meds, appt with surgeon on 11/4.   5. ESRD -  HD on MWF @ [facility] in [place]; K 4.3; ACE wrap currently covers AVF @ LUA.  HD tomorrow, needs outpatient dialysis orders and meds. 6. Hypertension/volume - BP 147/57, frequently runs low on HD, currently on IV Cardizem for A-fib; wt 52.2 kg, no signs or symptoms of fluid overload. 7. Anemia - Hgb 10, trending down.  Aranesp 60 mcg tomorrow. agree 8. Metabolic bone disease - Ca 8.4; Renvela 3 with meals.  Check on outpatient meds, renal panel tomorrow. 9. Nutrition - renal diet.  LYLES,CHARLES 05/09/2013, 9:03 AM   Attending Nephrologist: Annie Sable, MD  Patient seen and examined, agree with above note with above modifications. Pleasant 79 year old BF with ESRD- HD q MWF at [facility].  She is s/p fall and arm fracture.  Admitted with MS change but also Afib and positive trroponin.  Tomorrow she will get cardiac cath as well as her regular diaysis Annie Sable,  MD 05/09/2013

## 2013-05-09 NOTE — Progress Notes (Signed)
ANTICOAGULATION CONSULT NOTE - Follow up  Pharmacy Consult for Heparin (when INR <2) Indication: chest pain/ACS  No Known Allergies  Patient Measurements: Height: 5\' 5"  (165.1 cm) Weight: 115 lb (52.164 kg) IBW/kg (Calculated) : 57  Vital Signs: Temp: 98.5 F (36.9 C) (11/02 0300) Temp src: Oral (11/02 0300) BP: 147/72 mmHg (11/02 0300) Pulse Rate: 75 (11/02 0300)  Labs:  Recent Labs  05/07/13 1549  05/07/13 2303  05/08/13 0412 05/08/13 1420 05/08/13 1958 05/09/13 0200  HGB 11.2*  --   --   --  10.8*  --   --  10.0*  HCT 35.0*  --   --   --  33.5*  --   --  31.0*  PLT 229  --   --   --  196  --   --  201  LABPROT 41.5*  --   --   --  23.4*  --   --  19.8*  INR 4.57*  --   --   --  2.16*  --   --  1.74*  CREATININE 4.42*  --   --   --  5.28*  --   --  6.98*  CKTOTAL  --   --  200*  --   --   --   --   --   CKMB  --   --  13.5*  --   --   --   --   --   TROPONINI 1.64*  < >  --   < >  --  11.55* 10.88* 7.12*  < > = values in this interval not displayed.  Estimated Creatinine Clearance: 5.1 ml/min (by C-G formula based on Cr of 6.98).   Medical History: Past Medical History  Diagnosis Date  . Anemia 03/06/2011  . Hypertension   . Coronary artery disease   . Renal disorder   . Aortic stenosis, mild   . A-fib   . Acute CHF     Medications:  Warfarin PTA  Assessment: 79 y/o F  admitted 05/07/2013  with elevated troponin (now trending down), to start heparin per pharmacy.   PMH:  ESRD MWF, Afib,   Events:  Arm fracture, resplinted, hesitant to use pain meds.  Plan for cath 11/3  Coag: Afib, NSTEMI: INR on admit was 4.57, 5mg  IV Vit K was given. INR 1.74 today. H/H low but stable, no bleeding noted.  Goal of Therapy:  Heparin level 0.3-0.7 units/ml Monitor platelets by anticoagulation protocol: Yes   Plan:  -Holding warfarin -Start heparin drip at 600 units/hr at 0930 without bolus dose -F/U 8 hour HL at 1730 -Monitor CBC, levels, s/s of bleeding   -INR daily in the AM   Vinnie Level, PharmD.  [place] License #[id number] Application for [place] reciprocity pending  Clinical Pharmacist Pager [phone]   Thank you for allowing pharmacy to be a part of this patients care team.  Lovenia Kim Pharm.D., BCPS Clinical Pharmacist 05/09/2013 9:36 AM Pager: [phone] Phone: [phone]

## 2013-05-09 NOTE — Progress Notes (Signed)
DAILY PROGRESS NOTE  Subjective:  Arm feels better with re-splinting yesterday. Troponin rose and peaked at 11.55 and is now trending down.  Echo reviewed yesterday shows preserved LVEF without obvious wall motion abnormalities. She does have moderate calcific AS and mild calcific mitral disease.  Still without chest pain complaints.  INR has drifted down below 2.0 - now on heparin per pharmacy.   Objective:  Temp:  [97.7 F (36.5 C)-98.5 F (36.9 C)] 98.5 F (36.9 C) (11/02 0300) Pulse Rate:  [70-82] 75 (11/02 0300) Resp:  [11-20] 15 (11/02 0300) BP: (117-152)/(45-72) 147/72 mmHg (11/02 0300) SpO2:  [94 %-100 %] 98 % (11/02 0300) Weight change:   Intake/Output from previous day: 11/01 0701 - 11/02 0700 In: 1120 [P.O.:920; IV Piggyback:200] Out: -   Intake/Output from this shift:    Medications: Current Facility-Administered Medications  Medication Dose Route Frequency Provider Last Rate Last Dose  . acetaminophen (TYLENOL) tablet 650 mg  650 mg Oral Q6H PRN Brittany Blood, MD      . aspirin tablet 325 mg  325 mg Oral Daily Brittany Nose, MD   325 mg at 05/08/13 1118  . atorvastatin (LIPITOR) tablet 40 mg  40 mg Oral Daily Brittany Ogle, MD   40 mg at 05/08/13 1118  . ciprofloxacin (CIPRO) IVPB 400 mg  400 mg Intravenous Q24H [name], RPH   400 mg at 05/09/13 0217  . diltiazem (CARDIZEM) 100 mg in dextrose 5 % 100 mL infusion  5-15 mg/hr Intravenous Titrated Brittany Ogle, MD      . HYDROcodone-acetaminophen (NORCO/VICODIN) 5-325 MG per tablet 1 tablet  1 tablet Oral Q4H PRN Brittany Blood, MD   1 tablet at 05/09/13 [phone]  . HYDROmorphone (DILAUDID) injection 0.5 mg  0.5 mg Intravenous Q2H PRN Brittany Nose, MD   0.5 mg at 05/08/13 1959  . ondansetron (ZOFRAN) tablet 4 mg  4 mg Oral Q6H PRN Brittany Ogle, MD       Or  . ondansetron [facility]) injection 4 mg  4 mg Intravenous Q6H PRN Brittany Ogle, MD      . sevelamer carbonate (RENVELA) tablet 2,400 mg   2,400 mg Oral TID WC Brittany Ogle, MD   2,400 mg at 05/08/13 1754  . sodium bicarbonate tablet 1,300 mg  1,300 mg Oral BID Brittany Ogle, MD   1,300 mg at 05/08/13 2121  . sorbitol 70 % solution 30 mL  30 mL Oral QHS PRN Brittany Ogle, MD        Physical Exam: General appearance: alert and no distress Neck: no carotid bruit and no JVD Lungs: clear to auscultation bilaterally Heart: regular rate and rhythm, S1, S2 normal and systolic murmur: midsystolic 3/6, crescendo at 2nd right intercostal space Abdomen: soft, non-tender; bowel sounds normal; no masses,  no organomegaly Extremities: extremities normal, atraumatic, no cyanosis or edema Pulses: 2+ and symmetric Skin: Skin color, texture, turgor normal. No rashes or lesions Neurologic: Grossly normal Psych: Pleasant, no distress  Lab Results: Results for orders placed during the hospital encounter of 05/07/13 (from the past 48 hour(s))  CBC WITH DIFFERENTIAL     Status: Abnormal   Collection Time    05/07/13  3:49 PM      Result Value Range   WBC 8.0  4.0 - 10.5 Brittany/uL   RBC 3.81 (*) 3.87 - 5.11 MIL/uL   Hemoglobin 11.2 (*) 12.0 - 15.0 g/dL   HCT 96.0 (*) 45.4 -  46.0 %   MCV 91.9  78.0 - 100.0 fL   MCH 29.4  26.0 - 34.0 pg   MCHC 32.0  30.0 - 36.0 g/dL   RDW 96.2  95.2 - 84.1 %   Platelets 229  150 - 400 Brittany/uL   Neutrophils Relative % 71  43 - 77 %   Neutro Abs 5.6  1.7 - 7.7 Brittany/uL   Lymphocytes Relative 18  12 - 46 %   Lymphs Abs 1.5  0.7 - 4.0 Brittany/uL   Monocytes Relative 9  3 - 12 %   Monocytes Absolute 0.8  0.1 - 1.0 Brittany/uL   Eosinophils Relative 1  0 - 5 %   Eosinophils Absolute 0.1  0.0 - 0.7 Brittany/uL   Basophils Relative 0  0 - 1 %   Basophils Absolute 0.0  0.0 - 0.1 Brittany/uL  COMPREHENSIVE METABOLIC PANEL     Status: Abnormal   Collection Time    05/07/13  3:49 PM      Result Value Range   Sodium 135  135 - 145 mEq/L   Potassium 3.6  3.5 - 5.1 mEq/L   Chloride 94 (*) 96 - 112 mEq/L   CO2 27  19 - 32 mEq/L   Glucose, Bld 116  (*) 70 - 99 mg/dL   BUN 22  6 - 23 mg/dL   Creatinine, Ser 3.24 (*) 0.50 - 1.10 mg/dL   Calcium 8.7  8.4 - 40.1 mg/dL   Total Protein 6.9  6.0 - 8.3 g/dL   Albumin 3.2 (*) 3.5 - 5.2 g/dL   AST 18  0 - 37 U/L   ALT 7  0 - 35 U/L   Alkaline Phosphatase 52  39 - 117 U/L   Total Bilirubin 0.4  0.3 - 1.2 mg/dL   GFR calc non Af Amer 8 (*) >90 mL/min   GFR calc Af Amer 10 (*) >90 mL/min   Comment: (NOTE)     The eGFR has been calculated using the CKD EPI equation.     This calculation has not been validated in all clinical situations.     eGFR's persistently <90 mL/min signify possible Chronic Kidney     Disease.  TROPONIN I     Status: Abnormal   Collection Time    05/07/13  3:49 PM      Result Value Range   Troponin I 1.64 (*) <0.30 ng/mL   Comment:            Due to the release kinetics of cTnI,     a negative result within the first hours     of the onset of symptoms does not rule out     myocardial infarction with certainty.     If myocardial infarction is still suspected,     repeat the test at appropriate intervals.     REPEATED TO VERIFY     CRITICAL RESULT CALLED TO, READ BACK BY AND VERIFIED WITH:     Brittany COBB,RN 1658 05/07/13 WBOND  PROTIME-INR     Status: Abnormal   Collection Time    05/07/13  3:49 PM      Result Value Range   Prothrombin Time 41.5 (*) 11.6 - 15.2 seconds   INR 4.57 (*) 0.00 - 1.49  TROPONIN I     Status: Abnormal   Collection Time    05/07/13  8:41 PM      Result Value Range   Troponin I 2.93 (*) <0.30 ng/mL  Comment: REPEATED TO VERIFY     CRITICAL VALUE NOTED.  VALUE IS CONSISTENT WITH PREVIOUSLY REPORTED AND CALLED VALUE.                Due to the release kinetics of cTnI,     a negative result within the first hours     of the onset of symptoms does not rule out     myocardial infarction with certainty.     If myocardial infarction is still suspected,     repeat the test at appropriate intervals.  TSH     Status: Abnormal   Collection  Time    05/07/13  9:17 PM      Result Value Range   TSH 5.516 (*) 0.350 - 4.500 uIU/mL   Comment: Performed at [facility]  PROCALCITONIN     Status: None   Collection Time    05/07/13  9:18 PM      Result Value Range   Procalcitonin 0.21     Comment:            Interpretation:     PCT (Procalcitonin) <= 0.5 ng/mL:     Systemic infection (sepsis) is not likely.     Local bacterial infection is possible.     (NOTE)             ICU PCT Algorithm               Non ICU PCT Algorithm        ----------------------------     ------------------------------             PCT < 0.25 ng/mL                 PCT < 0.1 ng/mL         Stopping of antibiotics            Stopping of antibiotics           strongly encouraged.               strongly encouraged.        ----------------------------     ------------------------------           PCT level decrease by               PCT < 0.25 ng/mL           >= 80% from peak PCT           OR PCT 0.25 - 0.5 ng/mL          Stopping of antibiotics                                                 encouraged.         Stopping of antibiotics               encouraged.        ----------------------------     ------------------------------           PCT level decrease by              PCT >= 0.25 ng/mL           < 80% from peak PCT            AND PCT >= 0.5 ng/mL  Continuing antibiotics                                                  encouraged.           Continuing antibiotics                encouraged.        ----------------------------     ------------------------------         PCT level increase compared          PCT > 0.5 ng/mL             with peak PCT AND              PCT >= 0.5 ng/mL             Escalation of antibiotics                                              strongly encouraged.          Escalation of antibiotics            strongly encouraged.  MRSA PCR SCREENING     Status: None   Collection Time    05/07/13 10:30 PM       Result Value Range   MRSA by PCR NEGATIVE  NEGATIVE   Comment:            The GeneXpert MRSA Assay (FDA     approved for NASAL specimens     only), is one component of a     comprehensive MRSA colonization     surveillance program. It is not     intended to diagnose MRSA     infection nor to guide or     monitor treatment for     MRSA infections.  CK TOTAL AND CKMB     Status: Abnormal   Collection Time    05/07/13 11:03 PM      Result Value Range   Total CK 200 (*) 7 - 177 U/L   CK, MB 13.5 (*) 0.3 - 4.0 ng/mL   Comment: CRITICAL RESULT CALLED TO, READ BACK BY AND VERIFIED WITH:     Yetta Barre L,RN 05/08/13 0122 WAYK   Relative Index 6.8 (*) 0.0 - 2.5  URINALYSIS, ROUTINE W REFLEX MICROSCOPIC     Status: Abnormal   Collection Time    05/07/13 11:33 PM      Result Value Range   Color, Urine AMBER (*) YELLOW   Comment: BIOCHEMICALS MAY BE AFFECTED BY COLOR   APPearance TURBID (*) CLEAR   Specific Gravity, Urine 1.015  1.005 - 1.030   pH 7.0  5.0 - 8.0   Glucose, UA NEGATIVE  NEGATIVE mg/dL   Hgb urine dipstick MODERATE (*) NEGATIVE   Bilirubin Urine SMALL (*) NEGATIVE   Ketones, ur NEGATIVE  NEGATIVE mg/dL   Protein, ur >409 (*) NEGATIVE mg/dL   Urobilinogen, UA 0.2  0.0 - 1.0 mg/dL   Nitrite POSITIVE (*) NEGATIVE   Leukocytes, UA MODERATE (*) NEGATIVE  URINE MICROSCOPIC-ADD ON     Status: Abnormal   Collection Time    05/07/13 11:33 PM      Result Value Range  Squamous Epithelial / LPF MANY (*) RARE   WBC, UA 7-10  <3 WBC/hpf   RBC / HPF 3-6  <3 RBC/hpf   Bacteria, UA MANY (*) RARE  URINE CULTURE     Status: None   Collection Time    05/07/13 11:34 PM      Result Value Range   Specimen Description URINE, RANDOM     Special Requests NONE     Culture  Setup Time       Value: 05/08/2013 04:05     Performed at [facility] Count       Value: >=100,000 COLONIES/ML     Performed at [facility]   Culture       Value: ESCHERICHIA COLI      Performed at [facility]   Report Status PENDING    TROPONIN I     Status: Abnormal   Collection Time    05/08/13  1:42 AM      Result Value Range   Troponin I 7.77 (*) <0.30 ng/mL   Comment:            Due to the release kinetics of cTnI,     a negative result within the first hours     of the onset of symptoms does not rule out     myocardial infarction with certainty.     If myocardial infarction is still suspected,     repeat the test at appropriate intervals.     REPEATED TO VERIFY     CRITICAL VALUE NOTED.  VALUE IS CONSISTENT WITH PREVIOUSLY REPORTED AND CALLED VALUE.  PROTIME-INR     Status: Abnormal   Collection Time    05/08/13  4:12 AM      Result Value Range   Prothrombin Time 23.4 (*) 11.6 - 15.2 seconds   INR 2.16 (*) 0.00 - 1.49  BASIC METABOLIC PANEL     Status: Abnormal   Collection Time    05/08/13  4:12 AM      Result Value Range   Sodium 135  135 - 145 mEq/L   Potassium 4.1  3.5 - 5.1 mEq/L   Chloride 96  96 - 112 mEq/L   CO2 24  19 - 32 mEq/L   Glucose, Bld 86  70 - 99 mg/dL   BUN 33 (*) 6 - 23 mg/dL   Comment: DELTA CHECK NOTED   Creatinine, Ser 5.28 (*) 0.50 - 1.10 mg/dL   Calcium 8.6  8.4 - 16.1 mg/dL   GFR calc non Af Amer 7 (*) >90 mL/min   GFR calc Af Amer 8 (*) >90 mL/min   Comment: (NOTE)     The eGFR has been calculated using the CKD EPI equation.     This calculation has not been validated in all clinical situations.     eGFR's persistently <90 mL/min signify possible Chronic Kidney     Disease.  CBC     Status: Abnormal   Collection Time    05/08/13  4:12 AM      Result Value Range   WBC 8.7  4.0 - 10.5 Brittany/uL   RBC 3.68 (*) 3.87 - 5.11 MIL/uL   Hemoglobin 10.8 (*) 12.0 - 15.0 g/dL   HCT 09.6 (*) 04.5 - 40.9 %   MCV 91.0  78.0 - 100.0 fL   MCH 29.3  26.0 - 34.0 pg   MCHC 32.2  30.0 - 36.0 g/dL   RDW 13.6  11.5 - 15.5 %   Platelets 196  150 - 400 Brittany/uL  TROPONIN I     Status: Abnormal   Collection Time    05/08/13  2:20 PM        Result Value Range   Troponin I 11.55 (*) <0.30 ng/mL   Comment:            Due to the release kinetics of cTnI,     a negative result within the first hours     of the onset of symptoms does not rule out     myocardial infarction with certainty.     If myocardial infarction is still suspected,     repeat the test at appropriate intervals.     CRITICAL VALUE NOTED.  VALUE IS CONSISTENT WITH PREVIOUSLY REPORTED AND CALLED VALUE.     REPEATED TO VERIFY  TROPONIN I     Status: Abnormal   Collection Time    05/08/13  7:58 PM      Result Value Range   Troponin I 10.88 (*) <0.30 ng/mL   Comment:            Due to the release kinetics of cTnI,     a negative result within the first hours     of the onset of symptoms does not rule out     myocardial infarction with certainty.     If myocardial infarction is still suspected,     repeat the test at appropriate intervals.     CRITICAL VALUE NOTED.  VALUE IS CONSISTENT WITH PREVIOUSLY REPORTED AND CALLED VALUE.     REPEATED TO VERIFY  TROPONIN I     Status: Abnormal   Collection Time    05/09/13  2:00 AM      Result Value Range   Troponin I 7.12 (*) <0.30 ng/mL   Comment:            Due to the release kinetics of cTnI,     a negative result within the first hours     of the onset of symptoms does not rule out     myocardial infarction with certainty.     If myocardial infarction is still suspected,     repeat the test at appropriate intervals.     REPEATED TO VERIFY     CRITICAL VALUE NOTED.  VALUE IS CONSISTENT WITH PREVIOUSLY REPORTED AND CALLED VALUE.  PROTIME-INR     Status: Abnormal   Collection Time    05/09/13  2:00 AM      Result Value Range   Prothrombin Time 19.8 (*) 11.6 - 15.2 seconds   INR 1.74 (*) 0.00 - 1.49  BASIC METABOLIC PANEL     Status: Abnormal   Collection Time    05/09/13  2:00 AM      Result Value Range   Sodium 134 (*) 135 - 145 mEq/L   Potassium 4.3  3.5 - 5.1 mEq/L   Chloride 93 (*) 96 - 112  mEq/L   CO2 25  19 - 32 mEq/L   Glucose, Bld 77  70 - 99 mg/dL   BUN 48 (*) 6 - 23 mg/dL   Creatinine, Ser 1.61 (*) 0.50 - 1.10 mg/dL   Calcium 8.4  8.4 - 09.6 mg/dL   GFR calc non Af Amer 5 (*) >90 mL/min   GFR calc Af Amer 6 (*) >90 mL/min   Comment: (NOTE)     The eGFR has been calculated using the CKD  EPI equation.     This calculation has not been validated in all clinical situations.     eGFR's persistently <90 mL/min signify possible Chronic Kidney     Disease.  CBC     Status: Abnormal   Collection Time    05/09/13  2:00 AM      Result Value Range   WBC 7.8  4.0 - 10.5 Brittany/uL   RBC 3.43 (*) 3.87 - 5.11 MIL/uL   Hemoglobin 10.0 (*) 12.0 - 15.0 g/dL   HCT 16.1 (*) 09.6 - 04.5 %   MCV 90.4  78.0 - 100.0 fL   MCH 29.2  26.0 - 34.0 pg   MCHC 32.3  30.0 - 36.0 g/dL   RDW 40.9  81.1 - 91.4 %   Platelets 201  150 - 400 Brittany/uL    Imaging: Dg Chest 2 View  05/07/2013   CLINICAL DATA:  Chest pain and shortness of breath  EXAM: CHEST  2 VIEW  COMPARISON:  04/20/2013  FINDINGS: The cardiac shadow is mildly enlarged. The lungs are clear bilaterally. A stable compression deformity is seen in the upper thoracic spine. No other focal abnormality is noted.  IMPRESSION: No acute abnormality noted.   Electronically Signed   By: Alcide Clever [name]   On: 05/07/2013 17:17    Assessment:  1. Principal Problem: 2.   Altered mental status 3. Active Problems: 4.   Anemia 5.   HTN (hypertension) 6.   ESRD (end stage renal disease) on dialysis 7.   Long term (current) use of anticoagulants 8.   Atrial fibrillation with RVR 9.   NSTEMI (non-ST elevated myocardial infarction) 10.   Plan:  1. Troponin peak over 10, now trending down.  Recommend LHC tomorrow. Discussed the procedure and risk/benefit today. Encouraged pain medication use today for arm (she has been hesitant due to confusion) - she will need to be mobilized. Recommend PT and/or cardiac rehab.   Time Spent Directly with  Patient:  15 minutes  Length of Stay:  LOS: 2 days   Brittany Nose, MD, [facility] Attending Cardiologist [facility]  Ahmia Colford C 05/09/2013, 8:17 AM

## 2013-05-09 NOTE — Progress Notes (Addendum)
ANTICOAGULATION CONSULT NOTE - Follow up  Pharmacy Consult for Heparin (when INR <2) Indication: chest pain/ACS  No Known Allergies  Patient Measurements: Height: 5\' 5"  (165.1 cm) Weight: 115 lb (52.164 kg) IBW/kg (Calculated) : 57  Vital Signs: Temp: 97.8 F (36.6 C) (11/02 1600) Temp src: Oral (11/02 1600) BP: 145/59 mmHg (11/02 1200) Pulse Rate: 75 (11/02 1200)  Labs:  Recent Labs  05/07/13 1549  05/07/13 2303  05/08/13 0412 05/08/13 1420 05/08/13 1958 05/09/13 0200 05/09/13 1650  HGB 11.2*  --   --   --  10.8*  --   --  10.0*  --   HCT 35.0*  --   --   --  33.5*  --   --  31.0*  --   PLT 229  --   --   --  196  --   --  201  --   LABPROT 41.5*  --   --   --  23.4*  --   --  19.8*  --   INR 4.57*  --   --   --  2.16*  --   --  1.74*  --   HEPARINUNFRC  --   --   --   --   --   --   --   --  <0.10*  CREATININE 4.42*  --   --   --  5.28*  --   --  6.98*  --   CKTOTAL  --   --  200*  --   --   --   --   --   --   CKMB  --   --  13.5*  --   --   --   --   --   --   TROPONINI 1.64*  < >  --   < >  --  11.55* 10.88* 7.12*  --   < > = values in this interval not displayed.  Estimated Creatinine Clearance: 5.1 ml/min (by C-G formula based on Cr of 6.98).   Medications:  Warfarin PTA  Assessment: 79 y/o F  admitted 05/07/2013  with elevated troponin (now trending down), on heparin per pharmacy (coumadin on hold). The initial heparin level is undetectable (HL < 0.1; no infusion interruptions per RN), INR today= 1.74. Noted for cath in am.   Goal of Therapy:  Heparin level 0.3-0.7 units/ml Monitor platelets by anticoagulation protocol: Yes   Plan:  -Increase infusion to 800 units/hr -Heparin level in 8 hours and daily wth CBC daily  Harland German, Pharm D 05/09/2013 6:21 PM

## 2013-05-09 NOTE — Progress Notes (Signed)
[facility] Progress Note [name] TEAM 1 - Stepdown/ICU TEAM   [name] EXB:[id number] DOB: [birth year] DOA: 05/07/2013 PCP: [name], MD  Admit HPI / Brief Narrative: 79 yo female with history of atrial fibrillation on Coumadin, HTN, HLD, ESRD on HD MWF, grade II diastolic dysfunction per last 2 D ECHO 11/2012, with recent fall at home and with subsequent supracondylar humerus fracture with mild displacement and angulation of distal fragment, who was brought to [facility] ED after found to be more confused at home after she tool 2 tablets of percocet for pain. She apparently took her pain meds after HD then family noted she was somewhat confused. Per ED physician, pt became more alert compared to mental status on arrival.  Assessment/Plan:  Altered mental status Likely due to percocet being used for arm pain + UTI - will check B12 and folic acid - MS appears to have returned to baseline at this time   ESRD on HD M/W/F Nephrology consulted to continue usual HD   NSTEMI Cardiology following - for cath in AM, preferably before HD  Atrial fibrillation w/ RVR on chronic Coumadin Transitioned to heparin now that INR <2.0  Grade II diastolic dysfunction Well compensated at present   Supracondylar humerus fracture with mild displacement and angulation of distal fragment s/p fall 10/30 Family reports pt has an appointment with Dr. [name] as outpt for Tuesday - will alert him to her admit in AM to give the option for intpt consult if indicated   Anemia of chronic renal disease  Follow Hgb trend - no evidence of blood loss at present   HTN BP is reasonably controlled at this time  E coli UTI Will change to rocephin due to prevalence of cipro resistant E coli in community - f/u sensitivities when available   HLD  Code Status: FULL Family Communication: spoke w/ pt and daughter at bedside  Disposition Plan: SDU until cath completed    Consultants: Cardiology Nephrology  Procedures: none  Antibiotics: Cipro 10/31 >> 11/02 Rocephin 11/02 >>   DVT prophylaxis: IV heparin   HPI/Subjective: Pt is experiencing severe discomfort in her L arm.  She has been hesitant to take pain meds out of fear for inducing confusion. She denies cp, sob, f/c, n/v, or abdom pain.   Objective: Blood pressure 147/72, pulse 75, temperature 98.5 F (36.9 C), temperature source Oral, resp. rate 15, height 5\' 5"  (1.651 m), weight 52.164 kg (115 lb), SpO2 98.00%.  Intake/Output Summary (Last 24 hours) at 05/09/13 0838 Last data filed at 05/09/13 0600  Gross per 24 hour  Intake    880 ml  Output      0 ml  Net    880 ml   Exam: General: No acute respiratory distress Lungs: Clear to auscultation bilaterally without wheezes or crackles Cardiovascular: Regular rate without murmur gallop or rub - normal S1 and S2 Abdomen: Nontender, nondistended, soft, bowel sounds positive, no rebound, no ascites, no appreciable mass Extremities: No significant cyanosis, clubbing, or edema bilateral lower extremities - L UE in splint   Data Reviewed: Basic Metabolic Panel:  Recent Labs Lab 05/07/13 1549 05/08/13 0412 05/09/13 0200  NA 135 135 134*  K 3.6 4.1 4.3  CL 94* 96 93*  CO2 27 24 25   GLUCOSE 116* 86 77  BUN 22 33* 48*  CREATININE 4.42* 5.28* 6.98*  CALCIUM 8.7 8.6 8.4   Liver Function Tests:  Recent Labs Lab 05/07/13 1549  AST 18  ALT 7  ALKPHOS 52  BILITOT 0.4  PROT 6.9  ALBUMIN 3.2*   CBC:  Recent Labs Lab 05/07/13 1549 05/08/13 0412 05/09/13 0200  WBC 8.0 8.7 7.8  NEUTROABS 5.6  --   --   HGB 11.2* 10.8* 10.0*  HCT 35.0* 33.5* 31.0*  MCV 91.9 91.0 90.4  PLT 229 196 201   Cardiac Enzymes:  Recent Labs Lab 05/07/13 2041 05/07/13 2303 05/08/13 0142 05/08/13 1420 05/08/13 1958 05/09/13 0200  CKTOTAL  --  200*  --   --   --   --   CKMB  --  13.5*  --   --   --   --   TROPONINI 2.93*  --  7.77* 11.55*  10.88* 7.12*    Recent Results (from the past 240 hour(s))  MRSA PCR SCREENING     Status: None   Collection Time    05/07/13 10:30 PM      Result Value Range Status   MRSA by PCR NEGATIVE  NEGATIVE Final   Comment:            The GeneXpert MRSA Assay (FDA     approved for NASAL specimens     only), is one component of a     comprehensive MRSA colonization     surveillance program. It is not     intended to diagnose MRSA     infection nor to guide or     monitor treatment for     MRSA infections.  URINE CULTURE     Status: None   Collection Time    05/07/13 11:34 PM      Result Value Range Status   Specimen Description URINE, RANDOM   Final   Special Requests NONE   Final   Culture  Setup Time     Final   Value: 05/08/2013 04:05     Performed at [facility] Count     Final   Value: >=100,000 COLONIES/ML     Performed at [facility]   Culture     Final   Value: ESCHERICHIA COLI     Performed at [facility]   Report Status PENDING   Incomplete     Studies:  Recent x-ray studies have been reviewed in detail by the Attending Physician  Scheduled Meds:  Scheduled Meds: . aspirin  325 mg Oral Daily  . atorvastatin  40 mg Oral Daily  . ciprofloxacin  400 mg Intravenous Q24H  . sevelamer carbonate  2,400 mg Oral TID WC  . sodium bicarbonate  1,300 mg Oral BID    Time spent on care of this patient: 35 mins   [facility] T  [facility] Office  [phone] Pager - Text Page per Loretha Stapler as per below:  On-Call/Text Page:      [email or web address]      password [email or web address]  If 7PM-7AM, please contact night-coverage [email or web address] Password [facility] 05/09/2013, 8:38 AM   LOS: 2 days

## 2013-05-10 ENCOUNTER — Inpatient Hospital Stay (HOSPITAL_COMMUNITY): Payer: Medicare Other

## 2013-05-10 DIAGNOSIS — N390 Urinary tract infection, site not specified: Secondary | ICD-10-CM

## 2013-05-10 LAB — CBC
HCT: 30.4 % — ABNORMAL LOW (ref 36.0–46.0)
HCT: 30.9 % — ABNORMAL LOW (ref 36.0–46.0)
Hemoglobin: 10.2 g/dL — ABNORMAL LOW (ref 12.0–15.0)
MCH: 29.4 pg (ref 26.0–34.0)
MCV: 88.4 fL (ref 78.0–100.0)
Platelets: 227 10*3/uL (ref 150–400)
Platelets: 249 10*3/uL (ref 150–400)
RBC: 3.44 MIL/uL — ABNORMAL LOW (ref 3.87–5.11)
RBC: 3.47 MIL/uL — ABNORMAL LOW (ref 3.87–5.11)
RDW: 13.4 % (ref 11.5–15.5)
WBC: 9.6 10*3/uL (ref 4.0–10.5)
WBC: 8.7 10*3/uL (ref 4.0–10.5)

## 2013-05-10 LAB — FOLATE: Folate: 6.6 ng/mL

## 2013-05-10 LAB — RENAL FUNCTION PANEL
Albumin: 2.8 g/dL — ABNORMAL LOW (ref 3.5–5.2)
BUN: 67 mg/dL — ABNORMAL HIGH (ref 6–23)
CO2: 23 mEq/L (ref 19–32)
CO2: 22 mEq/L (ref 19–32)
Calcium: 8 mg/dL — ABNORMAL LOW (ref 8.4–10.5)
Chloride: 91 mEq/L — ABNORMAL LOW (ref 96–112)
GFR calc non Af Amer: 4 mL/min — ABNORMAL LOW (ref >90)
GFR calc non Af Amer: 4 mL/min — ABNORMAL LOW (ref >90)
Glucose, Bld: 75 mg/dL (ref 70–99)
Phosphorus: 8.5 mg/dL — ABNORMAL HIGH (ref 2.3–4.6)
Potassium: 4.8 mEq/L (ref 3.5–5.1)
Potassium: 4.4 mEq/L (ref 3.5–5.1)
Sodium: 133 mEq/L — ABNORMAL LOW (ref 135–145)

## 2013-05-10 LAB — FERRITIN: Ferritin: 1403 ng/mL — ABNORMAL HIGH (ref 10–291)

## 2013-05-10 LAB — HEPARIN LEVEL (UNFRACTIONATED)
Heparin Unfractionated: 0.1 IU/mL — ABNORMAL LOW (ref 0.30–0.70)
Heparin Unfractionated: 0.21 IU/mL — ABNORMAL LOW (ref 0.30–0.70)

## 2013-05-10 LAB — IRON AND TIBC
Iron: 23 ug/dL — ABNORMAL LOW (ref 42–135)
Saturation Ratios: 13 % — ABNORMAL LOW (ref 20–55)
TIBC: 174 ug/dL — ABNORMAL LOW (ref 250–470)

## 2013-05-10 LAB — URINE CULTURE: Colony Count: >=100000

## 2013-05-10 LAB — PROTIME-INR
INR: 2.09 — ABNORMAL HIGH (ref 0.00–1.49)
Prothrombin Time: 22.8 seconds — ABNORMAL HIGH (ref 11.6–15.2)

## 2013-05-10 LAB — VITAMIN B12: Vitamin B-12: 312 pg/mL (ref 211–911)

## 2013-05-10 MED ORDER — DILTIAZEM LOAD VIA INFUSION
5.0000 mg | Freq: Once | INTRAVENOUS | Status: AC
  Administered 2013-05-10: 5 mg via INTRAVENOUS
  Filled 2013-05-10: qty 5

## 2013-05-10 MED ORDER — DILTIAZEM HCL 25 MG/5ML IV SOLN
5.0000 mg | Freq: Once | INTRAVENOUS | Status: DC
  Filled 2013-05-10: qty 5

## 2013-05-10 NOTE — Progress Notes (Signed)
Patients heart rate 120-135, pt states that she is okay and is not having any chest pain or problems at this time. Dr. [name] paged, Dr. Arlean Hopping paged-ordered 4 k+ bath and cardizem 5 mg iv to be given x1 dose. Rapid response nurse called.

## 2013-05-10 NOTE — Significant Event (Addendum)
Rapid Response Event Note  Overview:  Called to assist with patient in HD with afib RVR Time Called: 1435 Arrival Time: 1440 Event Type: Cardiac  Initial Focused Assessment:  On arrival patient supine in bed - alert - w/d - denies CP or SOB but feels intermittent flutter in chest.  On HD.  BP 148/92 HR 163 afib with RVR RR 20 O2 sats 96% on RA.  Bil BS present - clear.  Left arm in cast.  IV heparin infusing left PIV.  2nd PIV NSL slow to flush = denies pain.  Unable to appreciate blood return - #22 catheter.  Dr. Arlean Hopping and Dr. Tresa Endo paged prior to RRT call - orders from Dr. Arlean Hopping noted - repaged Dr. Tresa Endo.   Interventions:  Placed on O2 2 liter nasal cannula.  HD changes noted per Dr. Arlean Hopping.  1445:  145/90 HR 167.  1500: 133/99 HR 160. Wilburt Finlay PA with cardiology here - Cardizem 5 mg IV bolus given followed by Cardizem infusion at 5 mg/hr.  1505: 119/57 HR 136.  Patient now asx - states the O2 feels good.  1510:  148/78  HR fluctuating between 116-142.  1515:  135/78 HR 126.  1530:  126/76 HR 124.  Denies any sx.  Cardizem remains at 5 mg/hr.  RN to call as needed.  30 mins HD remains.  1535:  Patient now sustaining 130-140 - Cardizem titrated to 10 mg/hr.  BP 159/72 HR 135.  1600:  HR 116  Bp 128/62.  Patient resting - no sx.     Event Summary: Name of Physician Notified: Dr. Arlean Hopping at 1430  Name of Consulting Physician Notified: Dr. Tresa Endo at 1435  Outcome:  (patient from 2H - back after HD)  Event End Time: 1530  [place]

## 2013-05-10 NOTE — Progress Notes (Signed)
  [facility] Progress Note    Subjective: alert, no complaints  Exam: Blood pressure 123/44, pulse 78, temperature 98.1 F (36.7 C), temperature source Oral, resp. rate 11, height 5\' 5"  (1.651 m), weight 55.8 kg (123 lb 0.3 oz), SpO2 97.00%. Gen: alert, cooperative and no distress  Resp: clear to auscultation bilaterally  Cardio: RRR with Gr III/VI systolic murmur, no rub  GI: soft, non-tender; bowel sounds normal; no masses, no organomegaly  Ext: left arm with ACE wrap and sling, lower extremities with no edema  Neuro: Grossly normal  Access: AVF @ LUA with + bruit (under ACE)   Dialysis Orders: MWF [facility]  3hrs  2K/2.5Ca  51.5kg  LUA AVF (buttonhole using 15G needles, both up)   Heparin none   400/600   Max UF 1.5/hr EPO 2200 biw   Hect 3.5ug tiw    Assessment:  1. L elbow fracture- in splint, has not had surgery yet 2. A-fib (chronic) with RVR- iv heparin and diltiazem 3. NSTEMI - peak trop 11, for cath today 4. ESRD MWF no hep at center 5. Hypertension/volume - BP up, 4-5kg over dry wt 6. Anemia - Hgb 10, trending down. Aranesp 60 mcg today 7. Metabolic bone disease - Ca 8.4; Renvela 3 with meals    Plan-  HD prob later today after cath, UF 3-4 kg , no hep   Vinson Moselle MD  pager [phone]    cell [phone]  05/10/2013, 9:15 AM   Recent Labs Lab 05/07/13 1549 05/08/13 0412 05/09/13 0200  NA 135 135 134*  K 3.6 4.1 4.3  CL 94* 96 93*  CO2 27 24 25   GLUCOSE 116* 86 77  BUN 22 33* 48*  CREATININE 4.42* 5.28* 6.98*  CALCIUM 8.7 8.6 8.4    Recent Labs Lab 05/07/13 1549  AST 18  ALT 7  ALKPHOS 52  BILITOT 0.4  PROT 6.9  ALBUMIN 3.2*    Recent Labs Lab 05/07/13 1549 05/08/13 0412 05/09/13 0200  WBC 8.0 8.7 7.8  NEUTROABS 5.6  --   --   HGB 11.2* 10.8* 10.0*  HCT 35.0* 33.5* 31.0*  MCV 91.9 91.0 90.4  PLT 229 196 201   . aspirin  325 mg Oral Daily  . atorvastatin  40 mg Oral Daily  . cefTRIAXone (ROCEPHIN)  IV  1 g  Intravenous Q24H  . sevelamer carbonate  2,400 mg Oral TID WC  . sodium bicarbonate  1,300 mg Oral BID  . sodium chloride  3 mL Intravenous Q12H   . sodium chloride 1 mL/kg/hr (05/10/13 0407)  . diltiazem (CARDIZEM) infusion Stopped (05/07/13 2119)  . heparin 800 Units/hr (05/09/13 1838)   sodium chloride, sodium chloride, sodium chloride, acetaminophen, feeding supplement (NEPRO CARB STEADY), heparin, HYDROcodone-acetaminophen, HYDROmorphone (DILAUDID) injection, lidocaine (PF), lidocaine-prilocaine, ondansetron (ZOFRAN) IV, ondansetron, oxyCODONE, pentafluoroprop-tetrafluoroeth, sodium chloride, sorbitol, traMADol

## 2013-05-10 NOTE — Progress Notes (Signed)
I agree with above note.   Christoper Fabian, PharmD, BCPS Clinical pharmacist, pager [phone] 05/10/2013   9:40 PM

## 2013-05-10 NOTE — Progress Notes (Addendum)
Subjective: Left arm pain. Never had CP.  She does report some fluttering in her chest 2-3 weeks ago.  Objective: Vital signs in last 24 hours: Temp:  [97.8 F (36.6 C)-98.8 F (37.1 C)] 98.1 F (36.7 C) (11/03 0844) Pulse Rate:  [75-88] 78 (11/03 0844) Resp:  [11-24] 11 (11/03 0844) BP: (121-152)/(44-65) 123/44 mmHg (11/03 0844) SpO2:  [96 %-100 %] 97 % (11/03 0844) Weight:  [123 lb 0.3 oz (55.8 kg)] 123 lb 0.3 oz (55.8 kg) (11/03 0400)    Intake/Output from previous day: 11/02 0701 - 11/03 0700 In: 419.2 [P.O.:180; I.V.:239.2] Out: -  Intake/Output this shift:    Medications Current Facility-Administered Medications  Medication Dose Route Frequency Provider Last Rate Last Dose  . 0.9 %  sodium chloride infusion  250 mL Intravenous PRN Chrystie Nose, MD      . 0.9 %  sodium chloride infusion  1 mL/kg/hr Intravenous Continuous Chrystie Nose, MD 52.2 mL/hr at 05/10/13 0407 1 mL/kg/hr at 05/10/13 0407  . 0.9 %  sodium chloride infusion  100 mL Intravenous PRN Gerome Apley, PA-C      . 0.9 %  sodium chloride infusion  100 mL Intravenous PRN Gerome Apley, PA-C      . acetaminophen (TYLENOL) tablet 650 mg  650 mg Oral Q6H PRN Lonia Blood, MD      . aspirin tablet 325 mg  325 mg Oral Daily Chrystie Nose, MD   325 mg at 05/09/13 0927  . atorvastatin (LIPITOR) tablet 40 mg  40 mg Oral Daily Dorothea Ogle, MD   40 mg at 05/09/13 4540  . cefTRIAXone (ROCEPHIN) 1 g in dextrose 5 % 50 mL IVPB  1 g Intravenous Q24H Lonia Blood, MD   1 g at 05/09/13 1728  . diltiazem (CARDIZEM) 100 mg in dextrose 5 % 100 mL infusion  5-15 mg/hr Intravenous Titrated Dorothea Ogle, MD      . feeding supplement (NEPRO CARB STEADY) liquid 237 mL  237 mL Oral PRN Gerome Apley, PA-C      . heparin ADULT infusion 100 units/mL (25000 units/250 mL)  800 Units/hr Intravenous Continuous Benny Lennert, [facility] 8 mL/hr at 05/09/13 1838 800 Units/hr at 05/09/13 1838  . heparin injection 1,000  Units  1,000 Units Dialysis PRN Gerome Apley, PA-C      . HYDROcodone-acetaminophen (NORCO/VICODIN) 5-325 MG per tablet 1 tablet  1 tablet Oral Q4H PRN Lonia Blood, MD   1 tablet at 05/09/13 1107  . HYDROmorphone (DILAUDID) injection 0.5 mg  0.5 mg Intravenous Q2H PRN Chrystie Nose, MD   0.5 mg at 05/10/13 0844  . lidocaine (PF) (XYLOCAINE) 1 % injection 5 mL  5 mL Intradermal PRN Gerome Apley, PA-C      . lidocaine-prilocaine (EMLA) cream 1 application  1 application Topical PRN Gerome Apley, PA-C      . ondansetron (ZOFRAN) tablet 4 mg  4 mg Oral Q6H PRN Dorothea Ogle, MD       Or  . ondansetron [facility]) injection 4 mg  4 mg Intravenous Q6H PRN Dorothea Ogle, MD      . oxyCODONE (Oxy IR/ROXICODONE) immediate release tablet 5-10 mg  5-10 mg Oral Q4H PRN Lonia Blood, MD   5 mg at 05/10/13 9811  . pentafluoroprop-tetrafluoroeth (GEBAUERS) aerosol 1 application  1 application Topical PRN Gerome Apley, PA-C      . sevelamer carbonate (RENVELA) tablet 2,400 mg  2,400 mg  Oral TID WC Dorothea Ogle, MD   2,400 mg at 05/09/13 1725  . sodium bicarbonate tablet 1,300 mg  1,300 mg Oral BID Dorothea Ogle, MD   1,300 mg at 05/09/13 2143  . sodium chloride 0.9 % injection 3 mL  3 mL Intravenous Q12H Chrystie Nose, MD      . sodium chloride 0.9 % injection 3 mL  3 mL Intravenous PRN Chrystie Nose, MD      . sorbitol 70 % solution 30 mL  30 mL Oral QHS PRN Dorothea Ogle, MD      . traMADol Janean Sark) tablet 50 mg  50 mg Oral Q6H PRN Lonia Blood, MD        PE: General appearance: alert, cooperative and no distress Lungs: clear to auscultation bilaterally Heart: regular rate and rhythm and 2/6 sys MM Extremities: No LEE Pulses: 2+ and symmetric Decreased pedal pulses Skin: Warm and dry. Neurologic: Grossly normal  Lab Results:   Recent Labs  05/07/13 1549 05/08/13 0412 05/09/13 0200  WBC 8.0 8.7 7.8  HGB 11.2* 10.8* 10.0*  HCT 35.0* 33.5* 31.0*  PLT 229 196 201    BMET  Recent Labs  05/07/13 1549 05/08/13 0412 05/09/13 0200  NA 135 135 134*  K 3.6 4.1 4.3  CL 94* 96 93*  CO2 27 24 25   GLUCOSE 116* 86 77  BUN 22 33* 48*  CREATININE 4.42* 5.28* 6.98*  CALCIUM 8.7 8.6 8.4   PT/INR  Recent Labs  05/07/13 1549 05/08/13 0412 05/09/13 0200  LABPROT 41.5* 23.4* 19.8*  INR 4.57* 2.16* 1.74*    Cardiac Panel (last 3 results)  Recent Labs  05/07/13 2303  05/08/13 1420 05/08/13 1958 05/09/13 0200  CKTOTAL 200*  --   --   --   --   CKMB 13.5*  --   --   --   --   TROPONINI  --   < > 11.55* 10.88* 7.12*  RELINDX 6.8*  --   --   --   --   < > = values in this interval not displayed.    Assessment/Plan  Principal Problem:   Altered mental status Active Problems:   Anemia   HTN (hypertension)   ESRD (end stage renal disease) on dialysis   Long term (current) use of anticoagulants   Atrial fibrillation with RVR   NSTEMI (non-ST elevated myocardial infarction)   UTI (urinary tract infection)  Plan:  Troponin continues to trend down. Left/right diagnostic heart cath planned for today.  HD after cath.  BP and HR stable.  EF 60-65%.  Regional WMA could not be excluded.  Ortho to see patient today.  ASAS/ heparin, lipitor, rocephin,    LOS: 3 days    HAGER, BRYAN 05/10/2013 9:46 AM   Patient seen and examined. Agree with assessment and plan. No chest pain. Trop increased to 11.55 c/w NSTEMI. Mod AS on echo. Will need R&L heart cath. Pt has not yet seen an orthopedist. ? If need for surgery vs just cast. Will need to move arm laterally if possible to allow for imaging of heart at cath. Will plan diagnostic cath today, but may need to defer PCI if indicated. Hemodialysis today.Discussed at l;ength with patient and family.   Lennette Bihari, MD, [facility] 05/10/2013 10:14 AM    Addendum:  INR just back; now risen to 2.05 from 1.7 yesterday.  Therefore will cancel cath for today, check LFT's. Cath when INR <1.7. Nicki Guadalajara  A,  MD 05/10/2013 11:05 AM

## 2013-05-10 NOTE — Progress Notes (Signed)
[facility] Progress Note [name]   [name]:[id number] DOB: 06/21/1928 DOA: 05/07/2013 PCP: [name], MD  Admit HPI / Brief Narrative: 79 yo female with history of atrial fibrillation on Coumadin, HTN, HLD, ESRD on HD MWF, grade II diastolic dysfunction per last 2 D ECHO 11/2012. Fell at home 10/29 and was treated in th ER for a  supracondylar humerus fracture with mild displacement and angulation of distal fragment. Splint was applied by the EDP and appointment made for pt to FU with Dr. [name]. She returned on 10/30 for inadequate pain control. She returned on  10/31 after she was found to be more confused after she had taken 2 tablets of percocet for pain. She apparently took her pain meds after HD then family noted she was somewhat confused. Per ED physician, pt became more alert compared to mental status on arrival. Of note she had presented to the ER 10/14 for palpitations.  Assessment/Plan:  Altered mental status Likely due to percocet being used for arm pain + UTI - B12 and folic acid pending- MS appears to have returned to baseline at this time   ESRD on HD M/W/F Nephrology consulted to continue usual HD   NSTEMI Cardiology following - cath planned for 11/3 but INR >2.0, plan to proceed once INR<1.7- peak TNI 11.55  Atrial fibrillation w/ RVR on chronic Coumadin Transitioned to heparin after INR <2.0 on 11/2 - recurred in HD 11/3 and now on Cardizem gtt per Cards  Grade II diastolic dysfunction Well compensated at present   Supracondylar humerus fracture with mild displacement and angulation of distal fragment s/p fall 10/30 Family reported pt has an appointment with Dr. [name] as outpt for Tuesday - office contacted and PA updated on current hospitalization including concerns that may need surgical repair and this may influence future cardiac treatments (ie potential stent placement) - f/u xray ordered per discussion btwn  Dr. [name] and Dr. [name] - determination on need for further tx will be based upon xray results and pt fitness for possible surgery  Anemia of chronic renal disease  Follow Hgb trend - no evidence of blood loss at present   HTN BP is reasonably controlled at this time  E coli UTI Was changed to Rocephin due to prevalence of cipro resistant E coli in community   HLD  Code Status: FULL Family Communication: spoke w/ pt and daughters at bedside  Disposition Plan: SDU until cath completed   Consultants: Cardiology Nephrology  Orthopedics  Procedures: none  Antibiotics: Cipro 10/31 >> 11/02 Rocephin 11/02 >>   DVT prophylaxis: IV heparin   HPI/Subjective: Pt left arm pain improved and now pt able to mobilize more freely. No CP or SOB- no palpitations  Objective: Blood pressure 128/44, pulse 82, temperature 98.1 F (36.7 C), temperature source Oral, resp. rate 14, height 5\' 5"  (1.651 m), weight 55.4 kg (122 lb 2.2 oz), SpO2 96.00%.  Intake/Output Summary (Last 24 hours) at 05/10/13 1357 Last data filed at 05/10/13 0600  Gross per 24 hour  Intake 389.24 ml  Output      0 ml  Net 389.24 ml   Exam: General: No acute respiratory distress Lungs: Clear to auscultation bilaterally without wheezes or crackles Cardiovascular: Regular rate without murmur gallop or rub - normal S1 and S2 Abdomen: Nontender, nondistended, soft, bowel sounds positive, no rebound, no ascites, no appreciable mass Extremities: No significant cyanosis, clubbing, or edema bilateral lower extremities - L UE  in splint   Data Reviewed: Basic Metabolic Panel:  Recent Labs Lab 05/07/13 1549 05/08/13 0412 05/09/13 0200 05/10/13 0910  NA 135 135 134* 133*  K 3.6 4.1 4.3 4.8  CL 94* 96 93* 92*  CO2 27 24 25 23   GLUCOSE 116* 86 77 75  BUN 22 33* 48* 67*  CREATININE 4.42* 5.28* 6.98* 8.76*  CALCIUM 8.7 8.6 8.4 8.0*  PHOS  --   --   --  8.5*   Liver Function Tests:  Recent Labs Lab  05/07/13 1549 05/10/13 0910  AST 18  --   ALT 7  --   ALKPHOS 52  --   BILITOT 0.4  --   PROT 6.9  --   ALBUMIN 3.2* 2.7*   CBC:  Recent Labs Lab 05/07/13 1549 05/08/13 0412 05/09/13 0200 05/10/13 0910 05/10/13 1318  WBC 8.0 8.7 7.8 9.6 8.7  NEUTROABS 5.6  --   --   --   --   HGB 11.2* 10.8* 10.0* 10.1* 10.2*  HCT 35.0* 33.5* 31.0* 30.4* 30.9*  MCV 91.9 91.0 90.4 88.4 89.0  PLT 229 196 201 227 249   Cardiac Enzymes:  Recent Labs Lab 05/07/13 2041 05/07/13 2303 05/08/13 0142 05/08/13 1420 05/08/13 1958 05/09/13 0200  CKTOTAL  --  200*  --   --   --   --   CKMB  --  13.5*  --   --   --   --   TROPONINI 2.93*  --  7.77* 11.55* 10.88* 7.12*    Recent Results (from the past 240 hour(s))  MRSA PCR SCREENING     Status: None   Collection Time    05/07/13 10:30 PM      Result Value Range Status   MRSA by PCR NEGATIVE  NEGATIVE Final   Comment:            The GeneXpert MRSA Assay (FDA     approved for NASAL specimens     only), is one component of a     comprehensive MRSA colonization     surveillance program. It is not     intended to diagnose MRSA     infection nor to guide or     monitor treatment for     MRSA infections.  URINE CULTURE     Status: None   Collection Time    05/07/13 11:34 PM      Result Value Range Status   Specimen Description URINE, RANDOM   Final   Special Requests NONE   Final   Culture  Setup Time     Final   Value: 05/08/2013 04:05     Performed at [facility] Count     Final   Value: >=100,000 COLONIES/ML     Performed at [facility]   Culture     Final   Value: ESCHERICHIA COLI     Performed at [facility]   Report Status 05/10/2013 FINAL   Final   Organism ID, Bacteria ESCHERICHIA COLI   Final     Studies:  Recent x-ray studies have been reviewed in detail by the Attending Physician  Scheduled Meds:  Scheduled Meds: . aspirin  325 mg Oral Daily  . atorvastatin  40 mg Oral Daily   . cefTRIAXone (ROCEPHIN)  IV  1 g Intravenous Q24H  . sevelamer carbonate  2,400 mg Oral TID WC  . sodium bicarbonate  1,300 mg Oral BID  . sodium  chloride  3 mL Intravenous Q12H    Time spent on care of this patient: 35 mins   ELLIS,[name] ANP  [facility] Office  [phone] Pager - Text Page per Loretha Stapler as per below:  On-Call/Text Page:      [email or web address]      password [email or web address]  If 7PM-7AM, please contact night-coverage [email or web address] Password [email or web address] 05/10/2013, 1:57 PM   LOS: 3 days   I have personally examined this patient and reviewed the entire database. I have reviewed the above note, made any necessary editorial changes, and agree with its content.  Lonia Blood, MD [facility]

## 2013-05-10 NOTE — Progress Notes (Signed)
ANTICOAGULATION CONSULT NOTE - Follow up  Pharmacy Consult for Heparin  Indication: chest pain/ACS  No Known Allergies  Patient Measurements: Height: 5\' 5"  (165.1 cm) Weight: 117 lb 11.6 oz (53.4 kg) IBW/kg (Calculated) : 57  Vital Signs: Temp: 99.4 F (37.4 C) (11/03 2017) Temp src: Oral (11/03 2017) BP: 105/46 mmHg (11/03 2000) Pulse Rate: 78 (11/03 2000)  Labs:  Recent Labs  05/07/13 2303  05/08/13 1420 05/08/13 1958 05/09/13 0200 05/09/13 1650 05/10/13 0910 05/10/13 1318 05/10/13 2035  HGB  --   < >  --   --  10.0*  --  10.1* 10.2*  --   HCT  --   < >  --   --  31.0*  --  30.4* 30.9*  --   PLT  --   < >  --   --  201  --  227 249  --   LABPROT  --   < >  --   --  19.8*  --  22.8*  --  21.8*  INR  --   < >  --   --  1.74*  --  2.09*  --  1.97*  HEPARINUNFRC  --   --   --   --   --  <0.10* 0.10*  --  0.21*  CREATININE  --   < >  --   --  6.98*  --  8.76* 9.00*  --   CKTOTAL 200*  --   --   --   --   --   --   --   --   CKMB 13.5*  --   --   --   --   --   --   --   --   TROPONINI  --   < > 11.55* 10.88* 7.12*  --   --   --   --   < > = values in this interval not displayed.  Estimated Creatinine Clearance: 4.1 ml/min (by C-G formula based on Cr of 9).   Medications:  Warfarin PTA  Assessment: 79 y/o F  admitted 05/07/2013  with elevated troponin (now trending down), on heparin per pharmacy (coumadin on hold). Heparin level increased to 0.21 but still remains subtherapeutic.  No bleeding or complications noted.  Goal of Therapy:  Heparin level 0.3-0.7 units/ml Monitor platelets by anticoagulation protocol: Yes   Plan:  1. Increase IV heparin to higher rate of 1050 units/hr. 2. Recheck heparin level in 7 hrs at 0500  3. Continue daily heparin level and CBC. 4. No Coumadin for now, will f/u plans for cath.  Vinnie Level, PharmD.  [place] License #[id number] Application for [place] reciprocity pending  Clinical Pharmacist Pager [phone]

## 2013-05-10 NOTE — Progress Notes (Signed)
ANTICOAGULATION CONSULT NOTE - Follow up  Pharmacy Consult for Heparin  Indication: chest pain/ACS  No Known Allergies  Patient Measurements: Height: 5\' 5"  (165.1 cm) Weight: 123 lb 0.3 oz (55.8 kg) IBW/kg (Calculated) : 57  Vital Signs: Temp: 98.1 F (36.7 C) (11/03 0844) Temp src: Oral (11/03 0844) BP: 123/44 mmHg (11/03 0844) Pulse Rate: 78 (11/03 0844)  Labs:  Recent Labs  05/07/13 2303  05/08/13 0412 05/08/13 1420 05/08/13 1958 05/09/13 0200 05/09/13 1650 05/10/13 0910  HGB  --   --  10.8*  --   --  10.0*  --  10.1*  HCT  --   --  33.5*  --   --  31.0*  --  30.4*  PLT  --   --  196  --   --  201  --  227  LABPROT  --   --  23.4*  --   --  19.8*  --  22.8*  INR  --   --  2.16*  --   --  1.74*  --  2.09*  HEPARINUNFRC  --   --   --   --   --   --  <0.10* 0.10*  CREATININE  --   --  5.28*  --   --  6.98*  --  8.76*  CKTOTAL 200*  --   --   --   --   --   --   --   CKMB 13.5*  --   --   --   --   --   --   --   TROPONINI  --   < >  --  11.55* 10.88* 7.12*  --   --   < > = values in this interval not displayed.  Estimated Creatinine Clearance: 4.4 ml/min (by C-G formula based on Cr of 8.76).   Medications:  Warfarin PTA  Assessment: 79 y/o F  admitted 05/07/2013  with elevated troponin (now trending down), on heparin per pharmacy (coumadin on hold). Heparin level still remains undetectable despite rate increase earlier today.  No bleeding or complications noted.  INR with rise to 2.05 today despite no Coumadin since admission and vitamin K administration 10/31.  Cath delayed until INR down.  Heparin was turned off in anticipation of cath.  Confirmed with Dr. Tresa Endo - will restart heparin.  Goal of Therapy:  Heparin level 0.3-0.7 units/ml Monitor platelets by anticoagulation protocol: Yes   Plan:  1. Restart IV heparin at higher rate of 950 units/hr. 2. Recheck heparin level in 8 hrs. 3. Continue daily heparin level and CBC. 4. No Coumadin for now, will f/u  plans for cath.  Tad Moore, BCPS  Clinical Pharmacist Pager [phone]  05/10/2013 11:29 AM

## 2013-05-10 NOTE — Clinical Documentation Improvement (Signed)
Per chart the Pdx is "altered mental status". In the Coding world this term is considered nonspecific and low weighted. If possible please clarify this term to illustrate this patient's severity of illness and risk of mortality. Thank you.  Possible Clinical Conditions?  Encephalopathy (describe type if known)                       Metabolic                       Toxic Poisoning / Overdose Other Condition  Supporting Information: -  Risk factors: Took 2 Percocet for arm pain, UTI -  Signs & Symptoms: altered mental status  Thank You, Beverley Fiedler, RN Clinical Documentation Specialist:  [phone]  [facility]- Health Information Management

## 2013-05-10 NOTE — Progress Notes (Signed)
PA. Leron Croak was present at patients bedside, and rapid response nurse also. Cardizem given per rapid response nurse.

## 2013-05-11 ENCOUNTER — Encounter (HOSPITAL_COMMUNITY)
Admission: EM | Disposition: A | Discharge status: Home / Self Care | Payer: Self-pay | Source: Home / Self Care | Attending: Internal Medicine

## 2013-05-11 DIAGNOSIS — R4182 Altered mental status, unspecified: Secondary | ICD-10-CM | POA: Diagnosis not present

## 2013-05-11 DIAGNOSIS — G92 Toxic encephalopathy: Secondary | ICD-10-CM

## 2013-05-11 DIAGNOSIS — I214 Non-ST elevation (NSTEMI) myocardial infarction: Secondary | ICD-10-CM | POA: Diagnosis not present

## 2013-05-11 DIAGNOSIS — I2510 Atherosclerotic heart disease of native coronary artery without angina pectoris: Secondary | ICD-10-CM

## 2013-05-11 HISTORY — PX: LEFT AND RIGHT HEART CATHETERIZATION WITH CORONARY ANGIOGRAM: SHX5449

## 2013-05-11 LAB — PROTIME-INR
INR: 1.72 — ABNORMAL HIGH (ref 0.00–1.49)
Prothrombin Time: 21.1 seconds — ABNORMAL HIGH (ref 11.6–15.2)

## 2013-05-11 LAB — HEPATITIS B SURFACE ANTIGEN: Hepatitis B Surface Ag: NEGATIVE

## 2013-05-11 LAB — COMPREHENSIVE METABOLIC PANEL
ALT: 5 U/L (ref 0–35)
AST: 16 U/L (ref 0–37)
Albumin: 2.4 g/dL — ABNORMAL LOW (ref 3.5–5.2)
Alkaline Phosphatase: 37 U/L — ABNORMAL LOW (ref 39–117)
Chloride: 95 mEq/L — ABNORMAL LOW (ref 96–112)
GFR calc non Af Amer: 7 mL/min — ABNORMAL LOW (ref >90)
Glucose, Bld: 107 mg/dL — ABNORMAL HIGH (ref 70–99)
Potassium: 4.2 mEq/L (ref 3.5–5.1)
Sodium: 133 mEq/L — ABNORMAL LOW (ref 135–145)
Total Bilirubin: 0.3 mg/dL (ref 0.3–1.2)

## 2013-05-11 LAB — POCT ACTIVATED CLOTTING TIME: Activated Clotting Time: 160 s

## 2013-05-11 LAB — CBC
Platelets: 210 10*3/uL (ref 150–400)
RDW: 13.4 % (ref 11.5–15.5)
WBC: 7.6 10*3/uL (ref 4.0–10.5)

## 2013-05-11 SURGERY — LEFT AND RIGHT HEART CATHETERIZATION WITH CORONARY ANGIOGRAM
Anesthesia: LOCAL

## 2013-05-11 MED ORDER — HYDRALAZINE HCL 20 MG/ML IJ SOLN: 10.0000 mg | INTRAMUSCULAR | Status: DC

## 2013-05-11 MED ORDER — ASPIRIN 81 MG PO CHEW: 81.0000 mg | CHEWABLE_TABLET | ORAL | Status: DC

## 2013-05-11 MED ORDER — SODIUM CHLORIDE 0.9 % IJ SOLN: 3.0000 mL | INTRAMUSCULAR | Status: DC | PRN

## 2013-05-11 MED ORDER — ONDANSETRON HCL 4 MG/2ML IJ SOLN: 4.0000 mg | Freq: Four times a day (QID) | INTRAMUSCULAR | Status: DC | PRN

## 2013-05-11 MED ORDER — FOLIC ACID 1 MG PO TABS
1.0000 mg | ORAL_TABLET | Freq: Every day | ORAL | Status: DC
  Administered 2013-05-11 – 2013-05-22 (×9): 1 mg via ORAL
  Filled 2013-05-11 (×12): qty 1

## 2013-05-11 MED ORDER — DARBEPOETIN ALFA-POLYSORBATE 60 MCG/0.3ML IJ SOLN
60.0000 ug | Freq: Once | INTRAMUSCULAR | Status: DC
  Filled 2013-05-11: qty 0.3

## 2013-05-11 MED ORDER — DARBEPOETIN ALFA-POLYSORBATE 60 MCG/0.3ML IJ SOLN
60.0000 ug | INTRAMUSCULAR | Status: DC
  Administered 2013-05-20 – 2013-05-21 (×2): 60 ug via INTRAVENOUS
  Filled 2013-05-11: qty 0.3

## 2013-05-11 MED ORDER — LIDOCAINE HCL (PF) 1 % IJ SOLN
INTRAMUSCULAR | Status: AC
  Filled 2013-05-11: qty 30

## 2013-05-11 MED ORDER — ACETAMINOPHEN 325 MG PO TABS
650.0000 mg | ORAL_TABLET | ORAL | Status: DC | PRN
  Filled 2013-05-11: qty 2

## 2013-05-11 MED ORDER — HEPARIN (PORCINE) IN NACL 2-0.9 UNIT/ML-% IJ SOLN
INTRAMUSCULAR | Status: AC
  Filled 2013-05-11: qty 1000

## 2013-05-11 MED ORDER — SODIUM CHLORIDE 0.9 % IJ SOLN: 3.0000 mL | Freq: Two times a day (BID) | INTRAMUSCULAR | Status: DC

## 2013-05-11 MED ORDER — DARBEPOETIN ALFA-POLYSORBATE 60 MCG/0.3ML IJ SOLN
60.0000 ug | Freq: Once | INTRAMUSCULAR | Status: AC
  Administered 2013-05-11: 60 ug via SUBCUTANEOUS
  Filled 2013-05-11: qty 0.3

## 2013-05-11 MED ORDER — HEPARIN (PORCINE) IN NACL 100-0.45 UNIT/ML-% IJ SOLN
1150.0000 [IU]/h | INTRAMUSCULAR | Status: DC
  Administered 2013-05-12 (×2): 1050 [IU]/h via INTRAVENOUS
  Administered 2013-05-13: 1150 [IU]/h via INTRAVENOUS
  Filled 2013-05-11 (×6): qty 250

## 2013-05-11 MED ORDER — SODIUM CHLORIDE 0.9 % IV SOLN: 250.0000 mL | INTRAVENOUS | Status: DC | PRN

## 2013-05-11 MED ORDER — MORPHINE SULFATE 2 MG/ML IJ SOLN: 2.0000 mg | INTRAMUSCULAR | Status: DC | PRN

## 2013-05-11 MED ORDER — HEPARIN (PORCINE) IN NACL 100-0.45 UNIT/ML-% IJ SOLN
1050.0000 [IU]/h | INTRAMUSCULAR | Status: DC
  Filled 2013-05-11: qty 250

## 2013-05-11 MED ORDER — SODIUM CHLORIDE 0.9 % IV SOLN
INTRAVENOUS | Status: DC
  Administered 2013-05-15: 23:00:00 via INTRAVENOUS
  Administered 2013-05-20: 10 mL/h via INTRAVENOUS

## 2013-05-11 MED ORDER — SODIUM CHLORIDE 0.9 % IV SOLN
INTRAVENOUS | Status: DC
  Administered 2013-05-11: 06:00:00 via INTRAVENOUS

## 2013-05-11 MED ORDER — NITROGLYCERIN 0.2 MG/ML ON CALL CATH LAB
INTRAVENOUS | Status: AC
  Filled 2013-05-11: qty 1

## 2013-05-11 MED ORDER — CYANOCOBALAMIN 1000 MCG/ML IJ SOLN
1000.0000 ug | Freq: Once | INTRAMUSCULAR | Status: AC
  Administered 2013-05-11: 1000 ug via INTRAMUSCULAR
  Filled 2013-05-11: qty 1

## 2013-05-11 NOTE — Progress Notes (Deleted)
ANTICOAGULATION CONSULT NOTE - Follow up  Pharmacy Consult for Heparin  Indication: chest pain/ACS  No Known Allergies  Patient Measurements: Height: 5\' 5"  (165.1 cm) Weight: 117 lb 11.6 oz (53.4 kg) IBW/kg (Calculated) : 57  Vital Signs: Temp: 98.3 F (36.8 C) (11/04 0438) Temp src: Oral (11/04 0438) BP: 114/45 mmHg (11/04 0400) Pulse Rate: 73 (11/04 0400)  Labs:  Recent Labs  05/08/13 1420 05/08/13 1958  05/09/13 0200 05/09/13 1650 05/10/13 0910 05/10/13 1318 05/10/13 2035 05/11/13 0405  HGB  --   --   < > 10.0*  --  10.1* 10.2*  --  8.8*  HCT  --   --   < > 31.0*  --  30.4* 30.9*  --  26.2*  PLT  --   --   < > 201  --  227 249  --  210  LABPROT  --   --   --  19.8*  --  22.8*  --  21.8*  --   INR  --   --   --  1.74*  --  2.09*  --  1.97*  --   HEPARINUNFRC  --   --   --   --  <0.10* 0.10*  --  0.21*  --   CREATININE  --   --   < > 6.98*  --  8.76* 9.00*  --  5.20*  TROPONINI 11.55* 10.88*  --  7.12*  --   --   --   --   --   < > = values in this interval not displayed.  Estimated Creatinine Clearance: 7 ml/min (by C-G formula based on Cr of 5.2).   Medications:  Warfarin PTA  Assessment: 79 y/o F  admitted 05/07/2013  with elevated troponin (now trending down), on heparin per pharmacy (coumadin on hold). Heparin level supratherapeutic this am.    Goal of Therapy:  Heparin level 0.3-0.7 units/ml Monitor platelets by anticoagulation protocol: Yes   Plan:  1. Decrease heparin to 950 units/hr 2. Recheck heparin level in 8 hrs. 3. Continue daily heparin level and CBC.   Talbert Cage, Ilda Basset D Clinical Pharmacist  05/11/2013 6:47 AM

## 2013-05-11 NOTE — CV Procedure (Signed)
[name] is a 79 y.o. female    [id number] LOCATION:  FACILITY: [facility]  PHYSICIAN: [name], [name] 1927/10/27   DATE OF PROCEDURE:  05/11/2013  DATE OF DISCHARGE:     CARDIAC CATHETERIZATION     History obtained from chart review.This is an 79 yo female with history of atrial fibrillation on Coumadin, HTN, HLD, ESRD on HD MWF, grade II diastolic dysfunction per last 2 D ECHO 11/2012, with recent fall at home and with subsequent supracondylar humerus fracture with mild displacement and angulation of distal fragment, now brought to [facility] ED after found to be more confused at home after she took 2 tablets of percocet for pain. Please note that pt is rather somnolent at the time of the admission and unable to provide history. She was apparently in HD today and took tablets after HD, family noted she was somewhat confused prior to HD but more so after the session. Pt able to reports no pain at this time, no chest pain and no shortness of breath, no reported fevers, chills, no specific focal neurological symptoms. SHe was found to have an elevated troponin and cardiology was asked to evaluate. She was found to be in afib with RVR at 110bpm but is now in NSR after Cardizem gtt. Because of elevated troponins/non-STEMI, the patient presents now for cardiac catheterization to define her anatomy. Her 2-D echo revealed normal LV function with moderate aortic stenosis.    PROCEDURE DESCRIPTION:   The patient was brought to the second floor [name] Cardiac cath lab in the postabsorptive state. She was not premedicated  Her right groinwas prepped and shaved in usual sterile fashion. Xylocaine 1% was used for local anesthesia. A 5 French sheath was inserted into the right common femoral artery using standard Seldinger technique.5 French right and left Judkins diagnostic catheters were used for selective coronary angiography. Left ventriculography was not performed. Visipaque dye was used for the  entirety of the case. A retrograde aortic pressure was monitored during the case.   HEMODYNAMICS:    AO SYSTOLIC/AO DIASTOLIC: 151/62    ANGIOGRAPHIC RESULTS:   1. Left main; large and patulous.  2. LAD; diffusely calcified fluoroscopically without evidence of obstructive disease 3. Left circumflex; the AV groove circumflex after the first obtuse marginal branch was occluded. This was the "culprit vessel. The first obtuse marginal branch was a large vessel with a 90% eccentric proximal calcified stenosis. It bifurcated in the midportion with 90% stenosis at the origin of the inferior bifurcating branch..  4. Right coronary artery; 90% calcified near ostial stenosis, 90% stenosis in the midportion. 7. Left ventriculography;not performed today  IMPRESSION:[name] has high-grade calcified circumflex obtuse marginal branch and dominant RCA stenoses best treated with high-speed rotational atherectomy. He does have preserved LV function by 2-D echo and moderate aortic stenosis. I will review her interventions with my colleagues including Dr. Daphene Beard. The sheath was removed and pressure was held on the groin to achieve hemostasis. The patient left the lab in stable condition. She will have hemodialysis tomorrow as already scheduled a potential revascularization on Friday.  Brittany Gess MD, [facility] 05/11/2013 3:51 PM

## 2013-05-11 NOTE — Progress Notes (Signed)
[facility] Progress Note    Subjective: rapid afib occurred in HD yesterday, treated with IV diltiazem, 2kg removed with stable BP. In sinus rhythm today  Exam: Blood pressure 130/52, pulse 72, temperature 98 F (36.7 C), temperature source Oral, resp. rate 11, height 5\' 5"  (1.651 m), weight 53.4 kg (117 lb 11.6 oz), SpO2 100.00%. Gen: alert, calm, no distress  Resp: clear bilat Cardio: RRR with Gr III/VI systolic murmur, no rub  GI: soft, NT, ND Ext: left arm with ACE wrap and sling, no LE edema  Neuro: NF, ox3  Access: AVF @ LUA with + bruit  Dialysis Orders: MWF [facility]  3hrs  2K/2.5Ca  51.5kg  LUA AVF (BH, both needles up, 15ga)   Heparin none EPO 2200 biw   Hect 3.5ug tiw    Assessment:  1. L elbow fracture- in splint, seen by ortho, plan OP f/u 2. A-fib (chronic) with RVR- iv heparin and diltiazem, nsr now, cards following; use 4K bath with HD 3. NSTEMI - peak trop 11, for cath today if INR low enough 4. ESRD MWF no hep at center 5. HTN/volume - BP stable, 2kg up; po dilt and labetalol at home, diltiazem only here 6. Anemia - Hgb down to 8's, give darbe 60ug today then weekly, check tsat 7. Metabolic bone disease - Ca 8.4; Renvela 3 with meals    Plan-  HD wed, UF to dry wt   Vinson Moselle MD  pager [phone]    cell [phone]  05/11/2013, 10:08 AM   Recent Labs Lab 05/10/13 0910 05/10/13 1318 05/11/13 0405  NA 133* 133* 133*  K 4.8 4.4 4.2  CL 92* 91* 95*  CO2 23 22 26   GLUCOSE 75 145* 107*  BUN 67* 69* 32*  CREATININE 8.76* 9.00* 5.20*  CALCIUM 8.0* 8.2* 7.7*  PHOS 8.5* 8.7*  --     Recent Labs Lab 05/07/13 1549 05/10/13 0910 05/10/13 1318 05/11/13 0405  AST 18  --   --  16  ALT 7  --   --  5  ALKPHOS 52  --   --  37*  BILITOT 0.4  --   --  0.3  PROT 6.9  --   --  5.6*  ALBUMIN 3.2* 2.7* 2.8* 2.4*    Recent Labs Lab 05/07/13 1549  05/10/13 0910 05/10/13 1318 05/11/13 0405  WBC 8.0  < > 9.6 8.7 7.6  NEUTROABS  5.6  --   --   --   --   HGB 11.2*  < > 10.1* 10.2* 8.8*  HCT 35.0*  < > 30.4* 30.9* 26.2*  MCV 91.9  < > 88.4 89.0 89.7  PLT 229  < > 227 249 210  < > = values in this interval not displayed. Marland Kitchen aspirin  325 mg Oral Daily  . atorvastatin  40 mg Oral Daily  . cefTRIAXone (ROCEPHIN)  IV  1 g Intravenous Q24H  . cyanocobalamin  1,000 mcg Intramuscular Once  . folic acid  1 mg Oral Daily  . sevelamer carbonate  2,400 mg Oral TID WC  . sodium bicarbonate  1,300 mg Oral BID  . sodium chloride  3 mL Intravenous Q12H   . sodium chloride 10 mL/hr at 05/11/13 0616  . diltiazem (CARDIZEM) infusion 5 mg/hr (05/11/13 0800)  . heparin 1,050 Units/hr (05/11/13 0900)   sodium chloride, sodium chloride, sodium chloride, acetaminophen, feeding supplement (NEPRO CARB STEADY), heparin, HYDROcodone-acetaminophen, HYDROmorphone (DILAUDID) injection, lidocaine (PF), lidocaine-prilocaine, ondansetron (ZOFRAN) IV, ondansetron,  oxyCODONE, pentafluoroprop-tetrafluoroeth, sodium chloride, sorbitol, traMADol

## 2013-05-11 NOTE — Progress Notes (Signed)
ANTICOAGULATION CONSULT NOTE - Follow up  Pharmacy Consult for Heparin  Indication: chest pain/ACS  No Known Allergies  Patient Measurements: Height: 5\' 5"  (165.1 cm) Weight: 117 lb 11.6 oz (53.4 kg) IBW/kg (Calculated) : 57  Vital Signs: Temp: 98 F (36.7 C) (11/04 0758) Temp src: Oral (11/04 0758) BP: 130/52 mmHg (11/04 0800) Pulse Rate: 72 (11/04 0800)  Labs:  Recent Labs  05/08/13 1420 05/08/13 1958  05/09/13 0200  05/10/13 0910 05/10/13 1318 05/10/13 2035 05/11/13 0405 05/11/13 0740  HGB  --   --   < > 10.0*  --  10.1* 10.2*  --  8.8*  --   HCT  --   --   < > 31.0*  --  30.4* 30.9*  --  26.2*  --   PLT  --   --   < > 201  --  227 249  --  210  --   LABPROT  --   --   < > 19.8*  --  22.8*  --  21.8*  --  21.1*  INR  --   --   < > 1.74*  --  2.09*  --  1.97*  --  1.89*  HEPARINUNFRC  --   --   --   --   < > 0.10*  --  0.21*  --  0.31  CREATININE  --   --   < > 6.98*  --  8.76* 9.00*  --  5.20*  --   TROPONINI 11.55* 10.88*  --  7.12*  --   --   --   --   --   --   < > = values in this interval not displayed.  Estimated Creatinine Clearance: 7 ml/min (by C-G formula based on Cr of 5.2).   Medications:  Warfarin PTA  Assessment: 79 y/o F  admitted 05/07/2013  with elevated troponin (now trending down), on heparin per pharmacy (coumadin on hold).  No bleeding or complications noted.  Heparin level now in therapeutic range at 0.31.  Heparin drip was decreased to 950 units/hr for about 1 hr this AM unintentionally, about 1 hr before heparin level was drawn.  INR 1.89 today.  Planning cath when INR < 1.7  Goal of Therapy:  Heparin level 0.3-0.7 units/ml Monitor platelets by anticoagulation protocol: Yes   Plan:  1. Increase IV heparin back to 1050 units/hr. 2. Continue daily CBC and heparin level. 3. F/u plans for cath lab, could consider small dose of po vitamin K?  Tad Moore, BCPS  Clinical Pharmacist Pager [phone]  05/11/2013 8:57  AM

## 2013-05-11 NOTE — Consult Note (Signed)
  Have reviewed left elbow films done 11/3   Acceptable align in both ap and lateral views   Would continue current splinting and will need to see in my office next week

## 2013-05-11 NOTE — Interval H&P Note (Signed)
Cath Lab Visit (complete for each Cath Lab visit)  Clinical Evaluation Leading to the Procedure:   ACS: yes  Non-ACS:    Anginal Classification: No Symptoms  Anti-ischemic medical therapy: No Therapy  Non-Invasive Test Results: No non-invasive testing performed  Prior CABG: No previous CABG      History and Physical Interval Note:  05/11/2013 3:12 PM  [name]  has presented today for surgery, with the diagnosis of NSTEMI  The various methods of treatment have been discussed with the patient and family. After consideration of risks, benefits and other options for treatment, the patient has consented to  Procedure(s): LEFT AND RIGHT HEART CATHETERIZATION WITH CORONARY ANGIOGRAM (N/A) as a surgical intervention .  The patient's history has been reviewed, patient examined, no change in status, stable for surgery.  I have reviewed the patient's chart and labs.  Questions were answered to the patient's satisfaction.     Runell Gess

## 2013-05-11 NOTE — Progress Notes (Signed)
ANTICOAGULATION CONSULT NOTE - Follow Up Consult  Pharmacy Consult for Heparin Indication: chest pain/ACS  No Known Allergies  Patient Measurements: Height: 5\' 5"  (165.1 cm) Weight: 117 lb 11.6 oz (53.4 kg) IBW/kg (Calculated) : 57 Heparin Dosing Weight: 57 kg  Labs:  Recent Labs  05/08/13 1958  05/09/13 0200  05/10/13 0910 05/10/13 1318 05/10/13 2035 05/11/13 0405 05/11/13 0740 05/11/13 1304  HGB  --   < > 10.0*  --  10.1* 10.2*  --  8.8*  --   --   HCT  --   < > 31.0*  --  30.4* 30.9*  --  26.2*  --   --   PLT  --   < > 201  --  227 249  --  210  --   --   LABPROT  --   < > 19.8*  --  22.8*  --  21.8*  --  21.1* 19.7*  INR  --   < > 1.74*  --  2.09*  --  1.97*  --  1.89* 1.72*  HEPARINUNFRC  --   --   --   < > 0.10*  --  0.21*  --  0.31  --   CREATININE  --   < > 6.98*  --  8.76* 9.00*  --  5.20*  --   --   TROPONINI 10.88*  --  7.12*  --   --   --   --   --   --   --   < > = values in this interval not displayed.  Estimated Creatinine Clearance: 7 ml/min (by C-G formula based on Cr of 5.2).  Assessment:  s/p cardiac cath.  May have atherectomy later this week. Heparin drip to resume 8 hours after sheath out => 12:30 am on 11/5.  Site noted without bleeding or hematoma.     Heparin level was 0.31 this morning on 950 units/hr, and drip was increased to 1050 units/hr.   Coumadin on hold. INR down to 1.72 at 1pm today.  Heparin needs likely to change as Coumadin effect diminishes.  Goal of Therapy:  Heparin level 0.3-0.7 units/ml Monitor platelets by anticoagulation protocol: Yes   Plan:    Resume heparin drip at 12:30am on 11/5 at 1050 units/hr.   Heparin level ~ 8 hours after drip resumes.   Daily heparin level and CBC while on Heparin.   Add daily PT/INR.  Dennie Fetters, [place] Pager: [phone] 05/11/2013,6:02 PM

## 2013-05-11 NOTE — Progress Notes (Signed)
[facility] Progress Note [place] TEAM 1 - Stepdown/ICU TEAM   [name] ZOX:[id number] DOB: 1928/05/30 DOA: 05/07/2013 PCP: [name], MD  Admit HPI / Brief Narrative: 79 yo female with history of atrial fibrillation on Coumadin, HTN, HLD, ESRD on HD MWF, grade II diastolic dysfunction per last 2 D ECHO 11/2012. Fell at home 10/29 and was treated in th ER for a  supracondylar humerus fracture with mild displacement and angulation of distal fragment. Splint was applied by the EDP and appointment made for pt to FU with Dr. [name]. She returned on 10/30 for inadequate pain control. She returned on  10/31 after she was found to be more confused after she had taken 2 tablets of percocet for pain. She apparently took her pain meds after HD then family noted she was somewhat confused. Per ED physician, pt became more alert compared to mental status on arrival. Of note she had presented to the ER 10/14 for palpitations.  Assessment/Plan:  Acute Encephalopathy- toxic  Likely due to percocet being used for arm pain + UTI - B12 low but folic acid low normal so will begin to replete (see below)- MS appears to have returned to baseline at this time   ESRD on HD M/W/F Nephrology consulted to continue usual HD   NSTEMI Cardiology following - cath planned for 11/3 but INR >2.0, plan to proceed once INR<1.7- peak TNI 11.55  Atrial fibrillation w/ RVR on chronic Coumadin Transitioned to heparin after INR <2.0 on 11/2 - recurred in HD 11/3 and now on Cardizem gtt per Cards-back in NSR thus far  Grade II diastolic dysfunction Well compensated at present   Supracondylar humerus fracture with mild displacement and angulation of distal fragment s/p fall 10/30 -Appreciate Dr. [name] assistance -XR revealed appropriate alignment of fx's so OK to continue splint and supportive care -will need to reschedule OP appointment for 1 week after dc  Anemia of chronic renal disease iron  deficiency and low normal B 12 and folic acid Follow Hgb trend - no evidence of blood loss at present -give  s/c B12 and PO Folic acid- will need formal OP labs/studies to determine if cannot absorb B12 - will likely dc on PO B12 until that can be accomplished-allow renal to replete iron  HTN BP is reasonably controlled at this time  E coli UTI Was changed to Rocephin due to prevalence of cipro resistant E coli in community   HLD  Code Status: FULL Family Communication: spoke w/ pt and daughters at bedside  Disposition Plan: SDU until cath completed   Consultants: Cardiology Nephrology  Orthopedics  Procedures: none  Antibiotics: Cipro 10/31 >> 11/02 Rocephin 11/02 >>   DVT prophylaxis: IV heparin   HPI/Subjective: Pt left arm pain improved but still with pain while ambulating/ repositioning. No CP or SOB.  Objective: Blood pressure 129/54, pulse 71, temperature 98 F (36.7 C), temperature source Oral, resp. rate 10, height 5\' 5"  (1.651 m), weight 117 lb 11.6 oz (53.4 kg), SpO2 100.00%.  Intake/Output Summary (Last 24 hours) at 05/11/13 1350 Last data filed at 05/11/13 1300  Gross per 24 hour  Intake 665.12 ml  Output   2041 ml  Net -1375.88 ml   Exam: General: No acute respiratory distress Lungs: Clear to auscultation bilaterally without wheezes or crackles Cardiovascular: Regular rate without murmur gallop or rub - normal S1 and S2 Abdomen: Nontender, nondistended, soft, bowel sounds positive, no rebound, no ascites, no appreciable mass Extremities: No significant cyanosis,  clubbing, or edema bilateral lower extremities - L UE in splint   Data Reviewed: Basic Metabolic Panel:  Recent Labs Lab 05/08/13 0412 05/09/13 0200 05/10/13 0910 05/10/13 1318 05/11/13 0405  NA 135 134* 133* 133* 133*  K 4.1 4.3 4.8 4.4 4.2  CL 96 93* 92* 91* 95*  CO2 24 25 23 22 26   GLUCOSE 86 77 75 145* 107*  BUN 33* 48* 67* 69* 32*  CREATININE 5.28* 6.98* 8.76* 9.00* 5.20*   CALCIUM 8.6 8.4 8.0* 8.2* 7.7*  PHOS  --   --  8.5* 8.7*  --    Liver Function Tests:  Recent Labs Lab 05/07/13 1549 05/10/13 0910 05/10/13 1318 05/11/13 0405  AST 18  --   --  16  ALT 7  --   --  5  ALKPHOS 52  --   --  37*  BILITOT 0.4  --   --  0.3  PROT 6.9  --   --  5.6*  ALBUMIN 3.2* 2.7* 2.8* 2.4*   CBC:  Recent Labs Lab 05/07/13 1549 05/08/13 0412 05/09/13 0200 05/10/13 0910 05/10/13 1318 05/11/13 0405  WBC 8.0 8.7 7.8 9.6 8.7 7.6  NEUTROABS 5.6  --   --   --   --   --   HGB 11.2* 10.8* 10.0* 10.1* 10.2* 8.8*  HCT 35.0* 33.5* 31.0* 30.4* 30.9* 26.2*  MCV 91.9 91.0 90.4 88.4 89.0 89.7  PLT 229 196 201 227 249 210   Cardiac Enzymes:  Recent Labs Lab 05/07/13 2041 05/07/13 2303 05/08/13 0142 05/08/13 1420 05/08/13 1958 05/09/13 0200  CKTOTAL  --  200*  --   --   --   --   CKMB  --  13.5*  --   --   --   --   TROPONINI 2.93*  --  7.77* 11.55* 10.88* 7.12*    Recent Results (from the past 240 hour(s))  MRSA PCR SCREENING     Status: None   Collection Time    05/07/13 10:30 PM      Result Value Range Status   MRSA by PCR NEGATIVE  NEGATIVE Final   Comment:            The GeneXpert MRSA Assay (FDA     approved for NASAL specimens     only), is one component of a     comprehensive MRSA colonization     surveillance program. It is not     intended to diagnose MRSA     infection nor to guide or     monitor treatment for     MRSA infections.  URINE CULTURE     Status: None   Collection Time    05/07/13 11:34 PM      Result Value Range Status   Specimen Description URINE, RANDOM   Final   Special Requests NONE   Final   Culture  Setup Time     Final   Value: 05/08/2013 04:05     Performed at [facility] Count     Final   Value: >=100,000 COLONIES/ML     Performed at [facility]   Culture     Final   Value: ESCHERICHIA COLI     Performed at [facility]   Report Status 05/10/2013 FINAL   Final   Organism  ID, Bacteria ESCHERICHIA COLI   Final     Studies:  Recent x-ray studies have been reviewed in detail by the  Attending Physician  Scheduled Meds:  Scheduled Meds: . aspirin  325 mg Oral Daily  . atorvastatin  40 mg Oral Daily  . cefTRIAXone (ROCEPHIN)  IV  1 g Intravenous Q24H  . [START ON 05/19/2013] darbepoetin (ARANESP) injection - DIALYSIS  60 mcg Intravenous Q Wed-HD  . folic acid  1 mg Oral Daily  . sevelamer carbonate  2,400 mg Oral TID WC    Time spent on care of this patient: 35 mins   ELLIS,[name] ANP  [facility] Office  [phone] Pager - Text Page per Loretha Stapler as per below:  On-Call/Text Page:      [email or web address]      password [email or web address]  If 7PM-7AM, please contact night-coverage [email or web address] Password [email or web address] 05/11/2013, 1:50 PM   LOS: 4 days       I have examined the patient, reviewed the chart and modified the above note which I agree with.   [place],MD [phone] 05/11/2013, 2:03 PM

## 2013-05-11 NOTE — H&P (View-Only) (Signed)
Subjective: Complaining of right arm pain. Denies CP. Mild SOB. Using [place].   Objective: Vital signs in last 24 hours: Temp:  [97.5 F (36.4 C)-99.4 F (37.4 C)] 98 F (36.7 C) (11/04 0758) Pulse Rate:  [68-153] 72 (11/04 0800) Resp:  [11-21] 11 (11/04 0800) BP: (96-163)/(39-92) 130/52 mmHg (11/04 0800) SpO2:  [96 %-100 %] 100 % (11/04 0800) Weight:  [117 lb 11.6 oz (53.4 kg)-122 lb 2.2 oz (55.4 kg)] 117 lb 11.6 oz (53.4 kg) (11/03 1600)    Intake/Output from previous day: 11/03 0701 - 11/04 0700 In: 573.1 [P.O.:40; I.V.:483.1; IV Piggyback:50] Out: 2041  Intake/Output this shift: Total I/O In: 14.5 [I.V.:14.5] Out: -   Medications Current Facility-Administered Medications  Medication Dose Route Frequency Provider Last Rate Last Dose  . 0.9 %  sodium chloride infusion  250 mL Intravenous PRN Chrystie Nose, MD      . 0.9 %  sodium chloride infusion  100 mL Intravenous PRN Gerome Apley, PA-C      . 0.9 %  sodium chloride infusion  100 mL Intravenous PRN Gerome Apley, PA-C      . 0.9 %  sodium chloride infusion   Intravenous Continuous Runell Gess, MD 10 mL/hr at 05/11/13 [id number]    . acetaminophen (TYLENOL) tablet 650 mg  650 mg Oral Q6H PRN Lonia Blood, MD      . aspirin tablet 325 mg  325 mg Oral Daily Chrystie Nose, MD   325 mg at 05/09/13 0927  . atorvastatin (LIPITOR) tablet 40 mg  40 mg Oral Daily Dorothea Ogle, MD   40 mg at 05/10/13 1637  . cefTRIAXone (ROCEPHIN) 1 g in dextrose 5 % 50 mL IVPB  1 g Intravenous Q24H Lonia Blood, MD   1 g at 05/10/13 1638  . cyanocobalamin ((VITAMIN B-12)) injection 1,000 mcg  1,000 mcg Intramuscular Once Calvert Cantor, MD      . diltiazem (CARDIZEM) 100 mg in dextrose 5 % 100 mL infusion  5-15 mg/hr Intravenous Titrated Dorothea Ogle, MD 5 mL/hr at 05/11/13 0800 5 mg/hr at 05/11/13 0800  . feeding supplement (NEPRO CARB STEADY) liquid 237 mL  237 mL Oral PRN Gerome Apley, PA-C      . folic acid (FOLVITE) tablet 1  mg  1 mg Oral Daily Saima Rizwan, MD      . heparin ADULT infusion 100 units/mL (25000 units/250 mL)  1,050 Units/hr Intravenous Continuous Gardner Candle, RPH      . heparin injection 1,000 Units  1,000 Units Dialysis PRN Gerome Apley, PA-C      . HYDROcodone-acetaminophen (NORCO/VICODIN) 5-325 MG per tablet 1 tablet  1 tablet Oral Q4H PRN Lonia Blood, MD   1 tablet at 05/09/13 1107  . HYDROmorphone (DILAUDID) injection 0.5 mg  0.5 mg Intravenous Q2H PRN Chrystie Nose, MD   0.5 mg at 05/11/13 1191  . lidocaine (PF) (XYLOCAINE) 1 % injection 5 mL  5 mL Intradermal PRN Gerome Apley, PA-C      . lidocaine-prilocaine (EMLA) cream 1 application  1 application Topical PRN Gerome Apley, PA-C      . ondansetron (ZOFRAN) tablet 4 mg  4 mg Oral Q6H PRN Dorothea Ogle, MD       Or  . ondansetron [facility]) injection 4 mg  4 mg Intravenous Q6H PRN Dorothea Ogle, MD      . oxyCODONE (Oxy IR/ROXICODONE) immediate release tablet 5-10 mg  5-10 mg Oral  Q4H PRN Lonia Blood, MD   5 mg at 05/11/13 0605  . pentafluoroprop-tetrafluoroeth (GEBAUERS) aerosol 1 application  1 application Topical PRN Gerome Apley, PA-C      . sevelamer carbonate (RENVELA) tablet 2,400 mg  2,400 mg Oral TID WC Dorothea Ogle, MD   2,400 mg at 05/10/13 1637  . sodium bicarbonate tablet 1,300 mg  1,300 mg Oral BID Dorothea Ogle, MD   1,300 mg at 05/10/13 2110  . sodium chloride 0.9 % injection 3 mL  3 mL Intravenous Q12H Chrystie Nose, MD      . sodium chloride 0.9 % injection 3 mL  3 mL Intravenous PRN Chrystie Nose, MD      . sorbitol 70 % solution 30 mL  30 mL Oral QHS PRN Dorothea Ogle, MD      . traMADol Janean Sark) tablet 50 mg  50 mg Oral Q6H PRN Lonia Blood, MD        PE: General appearance: alert, cooperative and no distress Lungs: clear to auscultation bilaterally Heart: regular rate and rhythm and 3/6 murmur, best heard at the apex Extremities: no LEE Pulses: 2+ and symmetric Skin: warm and  dry Neurologic: Grossly normal  Lab Results:   Recent Labs  05/10/13 0910 05/10/13 1318 05/11/13 0405  WBC 9.6 8.7 7.6  HGB 10.1* 10.2* 8.8*  HCT 30.4* 30.9* 26.2*  PLT 227 249 210   BMET  Recent Labs  05/10/13 0910 05/10/13 1318 05/11/13 0405  NA 133* 133* 133*  K 4.8 4.4 4.2  CL 92* 91* 95*  CO2 23 22 26   GLUCOSE 75 145* 107*  BUN 67* 69* 32*  CREATININE 8.76* 9.00* 5.20*  CALCIUM 8.0* 8.2* 7.7*   PT/INR  Recent Labs  05/10/13 0910 05/10/13 2035 05/11/13 0740  LABPROT 22.8* 21.8* 21.1*  INR 2.09* 1.97* 1.89*   Cardiac Panel (last 3 results)  Recent Labs  05/08/13 1420 05/08/13 1958 05/09/13 0200  TROPONINI 11.55* 10.88* 7.12*    Studies/Results:  2D echo Study Conclusions  - Left ventricle: The cavity size was normal. There was severe concentric hypertrophy. Systolic function was normal. The estimated ejection fraction was in the range of 60% to 65%. Regional wall motion abnormalities cannot be excluded. Doppler parameters are consistent with diastolic dysfunction. The E/e' ratio is >10, suggesting elevated LV filling pressure. - Aortic valve: Moderately calcified aortic valve. Peak and mean gradients of 33 mmHg and 17 mmHg, respectively. Based on an LVOT diameter of 2.0 cm, the calculated AVA is 1.2 cm2 - consistent with moderate aortic stenosis. No regurgitation. Valve area: 1.14cm^2(VTI). Valve area: 1.03cm^2 (Vmax). - Mitral valve: Moderately calcified posterior annulus and heavy calcification of the posterior leaflet. Mild regurgitation - no significant stenosis. - Left atrium: Severely dilated (50 ml/m2). - Right ventricle: The cavity size was mildly dilated. Systolic function is mildly reduced by TAPSE. - Right atrium: The atrium was mildly dilated (20.8 cm2). - Tricuspid valve: Moderate regurgitation. - Pulmonic valve: Trivial regurgitation. - Pulmonary arteries: PA peak pressure: 39mm Hg (S).  Assessment/Plan  Principal  Problem:   Altered mental status Active Problems:   Anemia   HTN (hypertension)   ESRD (end stage renal disease) on dialysis   Long term (current) use of anticoagulants   Atrial fibrillation with RVR   NSTEMI (non-ST elevated myocardial infarction)   UTI (urinary tract infection)  Plan: INR this am 1.89. Recommend rechecking INR this afternoon and plan for diagnostic LHC if <1.7. Can  attempt radially to reduce risk of bleed. No CP. NSR on telemetry. HR in the 80s. BP stable. MD to follow.     LOS: 4 days    [name] 05/11/2013 9:34 AM  Agree with note written by Boyce Medici  PAC  PAF, NSR. INR still supra therapeutic. Re checking this afternoon so can do cath if INR < 1.7. No CP/SOB. Exam benign except fpr 2/6 out flow murmur c/w her moderate AS (1.2 cm squared by 2D). . NSTEMI. If cath tomorrow the will need to do prior to HD. Can't cath radially because of HD.   Runell Gess 05/11/2013 10:43 AM'

## 2013-05-11 NOTE — Progress Notes (Addendum)
  Subjective: Complaining of right arm pain. Denies CP. Mild SOB. Using [facility].   Objective: Vital signs in last 24 hours: Temp:  [97.5 F (36.4 C)-99.4 F (37.4 C)] 98 F (36.7 C) (11/04 0758) Pulse Rate:  [68-153] 72 (11/04 0800) Resp:  [11-21] 11 (11/04 0800) BP: (96-163)/(39-92) 130/52 mmHg (11/04 0800) SpO2:  [96 %-100 %] 100 % (11/04 0800) Weight:  [117 lb 11.6 oz (53.4 kg)-122 lb 2.2 oz (55.4 kg)] 117 lb 11.6 oz (53.4 kg) (11/03 1600)    Intake/Output from previous day: 11/03 0701 - 11/04 0700 In: 573.1 [P.O.:40; I.V.:483.1; IV Piggyback:50] Out: 2041  Intake/Output this shift: Total I/O In: 14.5 [I.V.:14.5] Out: -   Medications Current Facility-Administered Medications  Medication Dose Route Frequency Provider Last Rate Last Dose  . 0.9 %  sodium chloride infusion  250 mL Intravenous PRN Kenneth C. Hilty, MD      . 0.9 %  sodium chloride infusion  100 mL Intravenous PRN Charles Lyles, PA-C      . 0.9 %  sodium chloride infusion  100 mL Intravenous PRN Charles Lyles, PA-C      . 0.9 %  sodium chloride infusion   Intravenous Continuous Jonathan J Berry, MD 10 mL/hr at 05/11/13 0616    . acetaminophen (TYLENOL) tablet 650 mg  650 mg Oral Q6H PRN Jeffrey T McClung, MD      . aspirin tablet 325 mg  325 mg Oral Daily Kenneth C. Hilty, MD   325 mg at 05/09/13 0927  . atorvastatin (LIPITOR) tablet 40 mg  40 mg Oral Daily Iskra M Myers, MD   40 mg at 05/10/13 1637  . cefTRIAXone (ROCEPHIN) 1 g in dextrose 5 % 50 mL IVPB  1 g Intravenous Q24H Jeffrey T McClung, MD   1 g at 05/10/13 1638  . cyanocobalamin ((VITAMIN B-12)) injection 1,000 mcg  1,000 mcg Intramuscular Once Saima Rizwan, MD      . diltiazem (CARDIZEM) 100 mg in dextrose 5 % 100 mL infusion  5-15 mg/hr Intravenous Titrated Iskra M Myers, MD 5 mL/hr at 05/11/13 0800 5 mg/hr at 05/11/13 0800  . feeding supplement (NEPRO CARB STEADY) liquid 237 mL  237 mL Oral PRN Charles Lyles, PA-C      . folic acid (FOLVITE) tablet 1  mg  1 mg Oral Daily Saima Rizwan, MD      . heparin ADULT infusion 100 units/mL (25000 units/250 mL)  1,050 Units/hr Intravenous Continuous Jessica C Carney, RPH      . heparin injection 1,000 Units  1,000 Units Dialysis PRN Charles Lyles, PA-C      . HYDROcodone-acetaminophen (NORCO/VICODIN) 5-325 MG per tablet 1 tablet  1 tablet Oral Q4H PRN Jeffrey T McClung, MD   1 tablet at 05/09/13 1107  . HYDROmorphone (DILAUDID) injection 0.5 mg  0.5 mg Intravenous Q2H PRN Kenneth C. Hilty, MD   0.5 mg at 05/11/13 0613  . lidocaine (PF) (XYLOCAINE) 1 % injection 5 mL  5 mL Intradermal PRN Charles Lyles, PA-C      . lidocaine-prilocaine (EMLA) cream 1 application  1 application Topical PRN Charles Lyles, PA-C      . ondansetron (ZOFRAN) tablet 4 mg  4 mg Oral Q6H PRN Iskra M Myers, MD       Or  . ondansetron (ZOFRAN) injection 4 mg  4 mg Intravenous Q6H PRN Iskra M Myers, MD      . oxyCODONE (Oxy IR/ROXICODONE) immediate release tablet 5-10 mg  5-10 mg Oral   Q4H PRN Jeffrey T McClung, MD   5 mg at 05/11/13 0605  . pentafluoroprop-tetrafluoroeth (GEBAUERS) aerosol 1 application  1 application Topical PRN Charles Lyles, PA-C      . sevelamer carbonate (RENVELA) tablet 2,400 mg  2,400 mg Oral TID WC Iskra M Myers, MD   2,400 mg at 05/10/13 1637  . sodium bicarbonate tablet 1,300 mg  1,300 mg Oral BID Iskra M Myers, MD   1,300 mg at 05/10/13 2110  . sodium chloride 0.9 % injection 3 mL  3 mL Intravenous Q12H Kenneth C. Hilty, MD      . sodium chloride 0.9 % injection 3 mL  3 mL Intravenous PRN Kenneth C. Hilty, MD      . sorbitol 70 % solution 30 mL  30 mL Oral QHS PRN Iskra M Myers, MD      . traMADol (ULTRAM) tablet 50 mg  50 mg Oral Q6H PRN Jeffrey T McClung, MD        PE: General appearance: alert, cooperative and no distress Lungs: clear to auscultation bilaterally Heart: regular rate and rhythm and 3/6 murmur, best heard at the apex Extremities: no LEE Pulses: 2+ and symmetric Skin: warm and  dry Neurologic: Grossly normal  Lab Results:   Recent Labs  05/10/13 0910 05/10/13 1318 05/11/13 0405  WBC 9.6 8.7 7.6  HGB 10.1* 10.2* 8.8*  HCT 30.4* 30.9* 26.2*  PLT 227 249 210   BMET  Recent Labs  05/10/13 0910 05/10/13 1318 05/11/13 0405  NA 133* 133* 133*  K 4.8 4.4 4.2  CL 92* 91* 95*  CO2 23 22 26  GLUCOSE 75 145* 107*  BUN 67* 69* 32*  CREATININE 8.76* 9.00* 5.20*  CALCIUM 8.0* 8.2* 7.7*   PT/INR  Recent Labs  05/10/13 0910 05/10/13 2035 05/11/13 0740  LABPROT 22.8* 21.8* 21.1*  INR 2.09* 1.97* 1.89*   Cardiac Panel (last 3 results)  Recent Labs  05/08/13 1420 05/08/13 1958 05/09/13 0200  TROPONINI 11.55* 10.88* 7.12*    Studies/Results:  2D echo Study Conclusions  - Left ventricle: The cavity size was normal. There was severe concentric hypertrophy. Systolic function was normal. The estimated ejection fraction was in the range of 60% to 65%. Regional wall motion abnormalities cannot be excluded. Doppler parameters are consistent with diastolic dysfunction. The E/e' ratio is >10, suggesting elevated LV filling pressure. - Aortic valve: Moderately calcified aortic valve. Peak and mean gradients of 33 mmHg and 17 mmHg, respectively. Based on an LVOT diameter of 2.0 cm, the calculated AVA is 1.2 cm2 - consistent with moderate aortic stenosis. No regurgitation. Valve area: 1.14cm^2(VTI). Valve area: 1.03cm^2 (Vmax). - Mitral valve: Moderately calcified posterior annulus and heavy calcification of the posterior leaflet. Mild regurgitation - no significant stenosis. - Left atrium: Severely dilated (50 ml/m2). - Right ventricle: The cavity size was mildly dilated. Systolic function is mildly reduced by TAPSE. - Right atrium: The atrium was mildly dilated (20.8 cm2). - Tricuspid valve: Moderate regurgitation. - Pulmonic valve: Trivial regurgitation. - Pulmonary arteries: PA peak pressure: 39mm Hg (S).  Assessment/Plan  Principal  Problem:   Altered mental status Active Problems:   Anemia   HTN (hypertension)   ESRD (end stage renal disease) on dialysis   Long term (current) use of anticoagulants   Atrial fibrillation with RVR   NSTEMI (non-ST elevated myocardial infarction)   UTI (urinary tract infection)  Plan: INR this am 1.89. Recommend rechecking INR this afternoon and plan for diagnostic LHC if <1.7. Can   attempt radially to reduce risk of bleed. No CP. NSR on telemetry. HR in the 80s. BP stable. MD to follow.     LOS: 4 days    Brittainy M. Simmons, PA-C 05/11/2013 9:34 AM  Agree with note written by Brittany Simmons  PAC  PAF, NSR. INR still supra therapeutic. Re checking this afternoon so can do cath if INR < 1.7. No CP/SOB. Exam benign except fpr 2/6 out flow murmur c/w her moderate AS (1.2 cm squared by 2D). . NSTEMI. If cath tomorrow the will need to do prior to HD. Can't cath radially because of HD.   BERRY,JONATHAN J 05/11/2013 10:43 AM' 

## 2013-05-12 LAB — PROTIME-INR
INR: 2.06 — ABNORMAL HIGH (ref 0.00–1.49)
Prothrombin Time: 22.6 seconds — ABNORMAL HIGH (ref 11.6–15.2)

## 2013-05-12 LAB — CBC
Hemoglobin: 9.4 g/dL — ABNORMAL LOW (ref 12.0–15.0)
MCHC: 32.4 g/dL (ref 30.0–36.0)
MCV: 91.2 fL (ref 78.0–100.0)
Platelets: 259 10*3/uL (ref 150–400)
RDW: 13.8 % (ref 11.5–15.5)
WBC: 7.6 10*3/uL (ref 4.0–10.5)

## 2013-05-12 LAB — HEPARIN LEVEL (UNFRACTIONATED): Heparin Unfractionated: 0.26 IU/mL — ABNORMAL LOW (ref 0.30–0.70)

## 2013-05-12 LAB — IRON AND TIBC
Saturation Ratios: 28 % (ref 20–55)
TIBC: 151 ug/dL — ABNORMAL LOW (ref 250–470)

## 2013-05-12 MED ORDER — QUETIAPINE 12.5 MG HALF TABLET
12.5000 mg | ORAL_TABLET | Freq: Every day | ORAL | Status: DC
  Administered 2013-05-12 – 2013-05-13 (×2): 12.5 mg via ORAL
  Filled 2013-05-12 (×3): qty 1

## 2013-05-12 MED ORDER — HEPARIN SODIUM (PORCINE) 1000 UNIT/ML DIALYSIS: 1000.0000 [IU] | INTRAMUSCULAR | Status: DC | PRN

## 2013-05-12 MED ORDER — FENTANYL 12 MCG/HR TD PT72
12.5000 ug | MEDICATED_PATCH | TRANSDERMAL | Status: DC
  Administered 2013-05-12 – 2013-05-18 (×3): 12.5 ug via TRANSDERMAL
  Filled 2013-05-12 (×3): qty 1

## 2013-05-12 MED ORDER — LIDOCAINE HCL (PF) 1 % IJ SOLN: 5.0000 mL | INTRAMUSCULAR | Status: DC | PRN

## 2013-05-12 MED ORDER — LIDOCAINE-PRILOCAINE 2.5-2.5 % EX CREA: 1.0000 "application " | TOPICAL_CREAM | CUTANEOUS | Status: DC | PRN

## 2013-05-12 MED ORDER — PENTAFLUOROPROP-TETRAFLUOROETH EX AERO: 1.0000 "application " | INHALATION_SPRAY | CUTANEOUS | Status: DC | PRN

## 2013-05-12 MED ORDER — ALTEPLASE 2 MG IJ SOLR
2.0000 mg | Freq: Once | INTRAMUSCULAR | Status: DC | PRN
  Filled 2013-05-12: qty 2

## 2013-05-12 MED ORDER — METOPROLOL TARTRATE 12.5 MG HALF TABLET
12.5000 mg | ORAL_TABLET | Freq: Two times a day (BID) | ORAL | Status: DC
  Administered 2013-05-12 – 2013-05-13 (×3): 12.5 mg via ORAL
  Filled 2013-05-12 (×4): qty 1

## 2013-05-12 MED ORDER — SODIUM CHLORIDE 0.9 % IV SOLN: 100.0000 mL | INTRAVENOUS | Status: DC | PRN

## 2013-05-12 MED ORDER — NEPRO/CARBSTEADY PO LIQD
237.0000 mL | ORAL | Status: DC | PRN
  Filled 2013-05-12: qty 237

## 2013-05-12 NOTE — Progress Notes (Signed)
ANTICOAGULATION CONSULT NOTE - Follow Up Consult  Pharmacy Consult for Heparin Indication: chest pain/ACS  No Known Allergies  Patient Measurements: Height: 5\' 5"  (165.1 cm) Weight: 121 lb 0.5 oz (54.9 kg) IBW/kg (Calculated) : 57 Heparin Dosing Weight: 57 kg  Labs:  Recent Labs  05/10/13 0910 05/10/13 1318 05/10/13 2035 05/11/13 0405 05/11/13 0740 05/11/13 1304 05/12/13 0419 05/12/13 1100  HGB 10.1* 10.2*  --  8.8*  --   --  9.4*  --   HCT 30.4* 30.9*  --  26.2*  --   --  29.0*  --   PLT 227 249  --  210  --   --  259  --   LABPROT 22.8*  --  21.8*  --  21.1* 19.7* 22.6*  --   INR 2.09*  --  1.97*  --  1.89* 1.72* 2.06*  --   HEPARINUNFRC 0.10*  --  0.21*  --  0.31  --   --  0.26*  CREATININE 8.76* 9.00*  --  5.20*  --   --   --   --     Estimated Creatinine Clearance: 7.2 ml/min (by C-G formula based on Cr of 5.2).  Assessment: Assessment: 79 y.o.f with recent fall at home with subsequent fracture brought to ED after she was confused at home after taking 2 percocet tablets for pain. INR at admission was 4.57.  Pharmacy consulted to start heparin after INR down to 1.89  PMH: anemia, HTN, CAD, CKD, AS, afib, CHF  AC:  Afib/ ACS NSTEMI - - given 5mg  IV vit K 10/31. Lab drawn about an hour after HD , was low, anticipate accumulation s/p HD, and given INR therapeutic will continue current rate and check  ID: starting cipro for abnormal UA, WBC wnl, afebrile Cipro 11/1>>11/2 CTX 11/2 >  10/31 Urine Cx>>E.Coli -->change to rocephin 10/31 MRSA PCR neg  CV: ACS, CK 200, CK MB 13.5, Trop 11.55>>7.12 (trend down), BP 130/52,  ASA 325mg , lipitor, metop 12.5 bid,  Endo: AM glucoses ok, TSH 5.5  GI/Nutr: Renal diet   Neuro: mental status back to baseline, narcotic use vs. UTI ; Fent patch, quetiapine,   Renal: ESRD - usually MWF OP.Overall tolerated ~ 3 hrs HD w/ BFR 400.  Pulm: 100% on 2L [place]  Heme/Onc: H/H low, stabilized, PLT wnl  Best practices: heparin  Goal  of Therapy:  Heparin level 0.3-0.7 units/ml Monitor platelets by anticoagulation protocol: Yes   Plan:    Heparin drip infusing at 1050 units/hr.  Will follow up 8 h level and adjust as indicated  Daily heparin level and CBC while on Heparin.   Add daily PT/INR.   Thank you for allowing pharmacy to be a part of this patients care team.  Lovenia Kim Pharm.D., BCPS Clinical Pharmacist 05/12/2013 12:26 PM Pager: [phone] Phone: [phone]

## 2013-05-12 NOTE — Progress Notes (Addendum)
Subjective: Complaints of left arm pain  Objective: Vital signs in last 24 hours: Temp:  [97.6 F (36.4 C)-98.3 F (36.8 C)] 97.6 F (36.4 C) (11/05 1016) Pulse Rate:  [31-103] 31 (11/05 1100) Resp:  [9-23] 19 (11/05 1100) BP: (86-157)/(45-101) 135/101 mmHg (11/05 1100) SpO2:  [95 %-100 %] 98 % (11/05 1100) Weight:  [121 lb 0.5 oz (54.9 kg)-123 lb 7.3 oz (56 kg)] 121 lb 0.5 oz (54.9 kg) (11/05 1016)    Intake/Output from previous day: 11/04 0701 - 11/05 0700 In: 481.2 [I.V.:431.2; IV Piggyback:50] Out: -  Intake/Output this shift: Total I/O In: -  Out: 1395 [Other:1395]  Medications Current Facility-Administered Medications  Medication Dose Route Frequency Provider Last Rate Last Dose  . 0.9 %  sodium chloride infusion   Intravenous Continuous Runell Gess, MD 5 mL/hr at 05/11/13 1900    . acetaminophen (TYLENOL) tablet 650 mg  650 mg Oral Q6H PRN Lonia Blood, MD      . acetaminophen (TYLENOL) tablet 650 mg  650 mg Oral Q4H PRN Runell Gess, MD      . aspirin tablet 325 mg  325 mg Oral Daily Chrystie Nose, MD   325 mg at 05/11/13 1135  . atorvastatin (LIPITOR) tablet 40 mg  40 mg Oral Daily Dorothea Ogle, MD   40 mg at 05/11/13 1135  . cefTRIAXone (ROCEPHIN) 1 g in dextrose 5 % 50 mL IVPB  1 g Intravenous Q24H Lonia Blood, MD   1 g at 05/11/13 1730  . [START ON 05/19/2013] darbepoetin (ARANESP) injection 60 mcg  60 mcg Intravenous Q Wed-HD Maree Krabbe, MD      . diltiazem (CARDIZEM) 100 mg in dextrose 5 % 100 mL infusion  5-15 mg/hr Intravenous Titrated Dorothea Ogle, MD 5 mL/hr at 05/12/13 0941 5 mg/hr at 05/12/13 0941  . folic acid (FOLVITE) tablet 1 mg  1 mg Oral Daily Saima Rizwan, MD   1 mg at 05/11/13 1158  . heparin ADULT infusion 100 units/mL (25000 units/250 mL)  1,050 Units/hr Intravenous Continuous Dennie Fetters, RPH 10.5 mL/hr at 05/12/13 0056 1,050 Units/hr at 05/12/13 0056  . hydrALAZINE (APRESOLINE) injection 10 mg  10 mg  Intravenous UD Runell Gess, MD      . HYDROcodone-acetaminophen (NORCO/VICODIN) 5-325 MG per tablet 1 tablet  1 tablet Oral Q4H PRN Lonia Blood, MD   1 tablet at 05/09/13 1107  . HYDROmorphone (DILAUDID) injection 0.5 mg  0.5 mg Intravenous Q2H PRN Chrystie Nose, MD   0.5 mg at 05/12/13 1036  . morphine 2 MG/ML injection 2 mg  2 mg Intravenous Q1H PRN Runell Gess, MD      . ondansetron [facility]) tablet 4 mg  4 mg Oral Q6H PRN Dorothea Ogle, MD       Or  . ondansetron [facility]) injection 4 mg  4 mg Intravenous Q6H PRN Dorothea Ogle, MD      . ondansetron [facility]) injection 4 mg  4 mg Intravenous Q6H PRN Runell Gess, MD      . oxyCODONE (Oxy IR/ROXICODONE) immediate release tablet 5-10 mg  5-10 mg Oral Q4H PRN Lonia Blood, MD   5 mg at 05/11/13 1730  . sevelamer carbonate (RENVELA) tablet 2,400 mg  2,400 mg Oral TID WC Dorothea Ogle, MD   2,400 mg at 05/10/13 1637  . sorbitol 70 % solution 30 mL  30 mL Oral QHS PRN Iskra  Aurther Loft, MD      . traMADol Janean Sark) tablet 50 mg  50 mg Oral Q6H PRN Lonia Blood, MD        PE: General appearance: alert, cooperative and no distress Lungs: clear to auscultation bilaterally Heart: regular rate and rhythm and 2/6 systolic MM Extremities: No LEE Pulses: 2+ and symmetric Skin: Warm and dry Neurologic: Grossly normal  Lab Results:   Recent Labs  05/10/13 1318 05/11/13 0405 05/12/13 0419  WBC 8.7 7.6 7.6  HGB 10.2* 8.8* 9.4*  HCT 30.9* 26.2* 29.0*  PLT 249 210 259   BMET  Recent Labs  05/10/13 0910 05/10/13 1318 05/11/13 0405  NA 133* 133* 133*  K 4.8 4.4 4.2  CL 92* 91* 95*  CO2 23 22 26   GLUCOSE 75 145* 107*  BUN 67* 69* 32*  CREATININE 8.76* 9.00* 5.20*  CALCIUM 8.0* 8.2* 7.7*   PT/INR  Recent Labs  05/11/13 0740 05/11/13 1304 05/12/13 0419  LABPROT 21.1* 19.7* 22.6*  INR 1.89* 1.72* 2.06*    Assessment/Plan  Principal Problem:   Encephalopathy, toxic Active Problems:   Anemia   HTN  (hypertension)   ESRD (end stage renal disease) on dialysis   Long term (current) use of anticoagulants   Atrial fibrillation with RVR   NSTEMI (non-ST elevated myocardial infarction)   UTI (urinary tract infection)  Plan:  INR went up to 2.06. Will delay HSRA of the RCA and OM until INR has decreased.  Possibly Friday.    LOS: 5 days    HAGER, BRYAN 05/12/2013 11:10 AM   Patient seen and examined. Agree with assessment and plan. Ci nes reviewed; complex anatomy with very significant calcification. Agree with if PCI to be done needs HSRA to debulk lesions prior to stenting.  INR today elevated; therefore, cannot due today.  Currently tachycardic at 122. Will check ECG. Add beta-blocker.    Lennette Bihari, MD, [facility] 05/12/2013 11:45 AM   Addendum: 12 lead ECG now shows AF at 120 bpm.  Will give 10 mg IV cardizem bolus and increase drip, start bb as above. Lennette Bihari, MD 12:08 PM

## 2013-05-12 NOTE — Procedures (Signed)
I was present at this dialysis session, have reviewed the session itself and made  appropriate changes   Vinson Moselle MD  pager [phone]    cell [phone]  05/12/2013, 9:46 AM

## 2013-05-12 NOTE — Progress Notes (Signed)
ANTICOAGULATION CONSULT NOTE - Follow Up Consult  Pharmacy Consult for heparin  Indication: chest pain/ACS  No Known Allergies  Patient Measurements: Height: 5\' 5"  (165.1 cm) Weight: 121 lb 0.5 oz (54.9 kg) IBW/kg (Calculated) : 57 Heparin Dosing Weight:   Vital Signs: Temp: 98 F (36.7 C) (11/05 1940) Temp src: Oral (11/05 1940) BP: 122/46 mmHg (11/05 2000) Pulse Rate: 70 (11/05 2000)  Labs:  Recent Labs  05/10/13 0910 05/10/13 1318  05/11/13 0405 05/11/13 0740 05/11/13 1304 05/12/13 0419 05/12/13 1100 05/12/13 2200  HGB 10.1* 10.2*  --  8.8*  --   --  9.4*  --   --   HCT 30.4* 30.9*  --  26.2*  --   --  29.0*  --   --   PLT 227 249  --  210  --   --  259  --   --   LABPROT 22.8*  --   < >  --  21.1* 19.7* 22.6*  --   --   INR 2.09*  --   < >  --  1.89* 1.72* 2.06*  --   --   HEPARINUNFRC 0.10*  --   < >  --  0.31  --   --  0.26* 0.21*  CREATININE 8.76* 9.00*  --  5.20*  --   --   --   --   --   < > = values in this interval not displayed.  Estimated Creatinine Clearance: 7.2 ml/min (by C-G formula based on Cr of 5.2).    Assessment: Heparin level 0.21 des[ite rate increase. No bleeding complications noted.  Goal of Therapy:  Heparin level 0.3-0.7 units/ml Monitor platelets by anticoagulation protocol: Yes   Plan:  Increase heparin to 1150 units/hr and recheck with am labs.   Janice Coffin 05/12/2013,11:05 PM

## 2013-05-12 NOTE — Progress Notes (Signed)
[facility] Progress Note [place] TEAM 1 - Stepdown/ICU TEAM   [name] ZOX:[id number] DOB: 1927-12-18 DOA: 05/07/2013 PCP: [name], MD  Admit HPI / Brief Narrative: 79 yo female with history of atrial fibrillation on Coumadin, HTN, HLD, ESRD on HD MWF, grade II diastolic dysfunction per last 2 D ECHO 11/2012. Fell at home 10/29 and was treated in th ER for a  supracondylar humerus fracture with mild displacement and angulation of distal fragment. Splint was applied by the EDP and appointment made for pt to FU with Dr. [name]. She returned on 10/30 for inadequate pain control. She returned on  10/31 after she was found to be more confused after she had taken 2 tablets of percocet for pain. She apparently took her pain meds after HD then family noted she was somewhat confused. Per ED physician, pt became more alert compared to mental status on arrival. Of note she had presented to the ER 10/14 for palpitations.  Assessment/Plan:  Acute Encephalopathy- toxic  -Likely due to percocet being used for arm pain + UTI - B12 low and folic acid low normal so will begin to replete (see below) - MS appears to have returned to baseline at this time but troubled now by sundowning and insomnia so will try low dose Seroquel at HS  ESRD on HD M/W/F Nephrology consulted to continue usual HD   NSTEMI Cardiology following - cath completed 11/4 and needs PTCA/arthrectomy- on hold until INR < 2.0  Atrial fibrillation w/ RVR on chronic Coumadin Transitioned to heparin after INR <2.0 on 11/2 - recurred in HD 11/3 and now on Cardizem gtt per Cards-back in NSR 11/4 so Cards dc'd Cardizem gtt but back into AF/RVR 11/5 so gtt resumed  Grade II diastolic dysfunction Well compensated at present   Supracondylar humerus fracture with mild displacement and angulation of distal fragment s/p fall 10/30 -inadequate pain control so will add low dose Duragesic patch -Appreciate Dr. [name]  assistance -XR revealed appropriate alignment of fx's so OK to continue splint and supportive care -will need to reschedule OP appointment for 1 week after dc  Anemia of chronic renal disease iron deficiency and low normal B 12 and folic acid Follow Hgb trend - no evidence of blood loss at present - give s/c B12 and PO Folic acid - will need to consider OP study to determine if cannot absorb B12 - will likely dc on PO B12 until that can be accomplished-allow renal to replete iron  HTN BP is reasonably controlled at this time  E coli UTI Was changed to Rocephin due to prevalence of cipro resistant E coli in community   HLD  Code Status: FULL Family Communication: spoke w/ pt and daughters at bedside  Disposition Plan: SDU until cath/Interventions completed   Consultants: Cardiology Nephrology  Orthopedics  Procedures: none  Antibiotics: Cipro 10/31 >> 11/02 Rocephin 11/02 >>   DVT prophylaxis: IV heparin   HPI/Subjective: Pt left arm pain improved but still with pain while ambulating/ repositioning. No CP or SOB.  Objective: Blood pressure 151/72, pulse 92, temperature 98.9 F (37.2 C), temperature source Oral, resp. rate 23, height 5\' 5"  (1.651 m), weight 121 lb 0.5 oz (54.9 kg), SpO2 98.00%.  Intake/Output Summary (Last 24 hours) at 05/12/13 1416 Last data filed at 05/12/13 1300  Gross per 24 hour  Intake  436.7 ml  Output   1395 ml  Net -958.3 ml   Exam: General: No acute respiratory distress Lungs: Clear to  auscultation bilaterally without wheezes or crackles Cardiovascular: Regular rate without murmur gallop or rub  Abdomen: Nontender, nondistended, soft, bowel sounds positive, no rebound, no ascites, no appreciable mass Extremities: No significant cyanosis, clubbing, or edema bilateral lower extremities - L UE in splint   Data Reviewed: Basic Metabolic Panel:  Recent Labs Lab 05/08/13 0412 05/09/13 0200 05/10/13 0910 05/10/13 1318 05/11/13 0405   NA 135 134* 133* 133* 133*  K 4.1 4.3 4.8 4.4 4.2  CL 96 93* 92* 91* 95*  CO2 24 25 23 22 26   GLUCOSE 86 77 75 145* 107*  BUN 33* 48* 67* 69* 32*  CREATININE 5.28* 6.98* 8.76* 9.00* 5.20*  CALCIUM 8.6 8.4 8.0* 8.2* 7.7*  PHOS  --   --  8.5* 8.7*  --    Liver Function Tests:  Recent Labs Lab 05/07/13 1549 05/10/13 0910 05/10/13 1318 05/11/13 0405  AST 18  --   --  16  ALT 7  --   --  5  ALKPHOS 52  --   --  37*  BILITOT 0.4  --   --  0.3  PROT 6.9  --   --  5.6*  ALBUMIN 3.2* 2.7* 2.8* 2.4*   CBC:  Recent Labs Lab 05/07/13 1549  05/09/13 0200 05/10/13 0910 05/10/13 1318 05/11/13 0405 05/12/13 0419  WBC 8.0  < > 7.8 9.6 8.7 7.6 7.6  NEUTROABS 5.6  --   --   --   --   --   --   HGB 11.2*  < > 10.0* 10.1* 10.2* 8.8* 9.4*  HCT 35.0*  < > 31.0* 30.4* 30.9* 26.2* 29.0*  MCV 91.9  < > 90.4 88.4 89.0 89.7 91.2  PLT 229  < > 201 227 249 210 259  < > = values in this interval not displayed. Cardiac Enzymes:  Recent Labs Lab 05/07/13 2041 05/07/13 2303 05/08/13 0142 05/08/13 1420 05/08/13 1958 05/09/13 0200  CKTOTAL  --  200*  --   --   --   --   CKMB  --  13.5*  --   --   --   --   TROPONINI 2.93*  --  7.77* 11.55* 10.88* 7.12*    Recent Results (from the past 240 hour(s))  MRSA PCR SCREENING     Status: None   Collection Time    05/07/13 10:30 PM      Result Value Range Status   MRSA by PCR NEGATIVE  NEGATIVE Final   Comment:            The GeneXpert MRSA Assay (FDA     approved for NASAL specimens     only), is one component of a     comprehensive MRSA colonization     surveillance program. It is not     intended to diagnose MRSA     infection nor to guide or     monitor treatment for     MRSA infections.  URINE CULTURE     Status: None   Collection Time    05/07/13 11:34 PM      Result Value Range Status   Specimen Description URINE, RANDOM   Final   Special Requests NONE   Final   Culture  Setup Time     Final   Value: 05/08/2013 04:05      Performed at [facility]   Colony Count     Final   Value: >=100,000 COLONIES/ML     Performed at  [facility]     Final   Value: ESCHERICHIA COLI     Performed at [facility]   Report Status 05/10/2013 FINAL   Final   Organism ID, Bacteria ESCHERICHIA COLI   Final     Studies:  Recent x-ray studies have been reviewed in detail by the Attending Physician  Scheduled Meds:  Scheduled Meds: . aspirin  325 mg Oral Daily  . atorvastatin  40 mg Oral Daily  . cefTRIAXone (ROCEPHIN)  IV  1 g Intravenous Q24H  . [START ON 05/19/2013] darbepoetin (ARANESP) injection - DIALYSIS  60 mcg Intravenous Q Wed-HD  . fentaNYL  12.5 mcg Transdermal Q72H  . folic acid  1 mg Oral Daily  . hydrALAZINE  10 mg Intravenous UD  . metoprolol tartrate  12.5 mg Oral BID  . QUEtiapine  12.5 mg Oral QHS  . sevelamer carbonate  2,400 mg Oral TID WC    Time spent on care of this patient: 35 mins   ELLIS,[name] ANP  [facility] Office  [phone] Pager - Text Page per Loretha Stapler as per below:  On-Call/Text Page:      [email or web address]      password [email or web address]  If 7PM-7AM, please contact night-coverage [email or web address] Password [email or web address] 05/12/2013, 2:16 PM   LOS: 5 days   I have personally examined this patient and reviewed the entire database. I have reviewed the above note, made any necessary editorial changes, and agree with its content.  Lonia Blood, MD [facility]

## 2013-05-12 NOTE — Progress Notes (Signed)
[facility] Progress Note    Subjective: Heart cath yest showed high grade lesions of RCA and LCx branch; in HD today  Exam: Blood pressure 149/72, pulse 82, temperature 98.1 F (36.7 C), temperature source Oral, resp. rate 16, height 5\' 5"  (1.651 m), weight 56 kg (123 lb 7.3 oz), SpO2 100.00%. Gen: alert, calm, no distress  Resp: clear bilat Cardio: RRR with Gr III/VI systolic murmur, no rub  GI: soft, NT, ND Ext: left arm with ACE wrap and sling, no LE edema  Neuro: NF, ox3  Access: AVF @ LUA with + bruit  Dialysis Orders: MWF [facility]  3hrs  2K/2.5Ca  51.5kg  LUA AVF (BH, both needles up, 15ga)   Heparin none EPO 2200 biw   Hect 3.5ug tiw    Assessment:  1. L elbow fracture- in splint, seen by ortho, plan OP f/u 2. A-fib (chronic) with RVR- iv heparin and diltiazem, cards following; use 4K bath with HD 3. NSTEMI, s/p cath- needs additional procedure per cardiology 4. ESRD MWF no hep at center 5. HTN/volume - BP stable; on po dilt here, holding labetalol; not tolerating UF with HD, doubt accuracy of wts 6. Anemia - Hgb down to 8's, give darbe 60ug today then weekly, check tsat 7. Metabolic bone disease - Ca 8.4; Renvela 3 with meals    Plan-  HD today   Vinson Moselle MD  pager [phone]    cell [phone]  05/12/2013, 9:39 AM   Recent Labs Lab 05/10/13 0910 05/10/13 1318 05/11/13 0405  NA 133* 133* 133*  K 4.8 4.4 4.2  CL 92* 91* 95*  CO2 23 22 26   GLUCOSE 75 145* 107*  BUN 67* 69* 32*  CREATININE 8.76* 9.00* 5.20*  CALCIUM 8.0* 8.2* 7.7*  PHOS 8.5* 8.7*  --     Recent Labs Lab 05/07/13 1549 05/10/13 0910 05/10/13 1318 05/11/13 0405  AST 18  --   --  16  ALT 7  --   --  5  ALKPHOS 52  --   --  37*  BILITOT 0.4  --   --  0.3  PROT 6.9  --   --  5.6*  ALBUMIN 3.2* 2.7* 2.8* 2.4*    Recent Labs Lab 05/07/13 1549  05/10/13 1318 05/11/13 0405 05/12/13 0419  WBC 8.0  < > 8.7 7.6 7.6  NEUTROABS 5.6  --   --   --   --   HGB  11.2*  < > 10.2* 8.8* 9.4*  HCT 35.0*  < > 30.9* 26.2* 29.0*  MCV 91.9  < > 89.0 89.7 91.2  PLT 229  < > 249 210 259  < > = values in this interval not displayed. Marland Kitchen aspirin  325 mg Oral Daily  . atorvastatin  40 mg Oral Daily  . cefTRIAXone (ROCEPHIN)  IV  1 g Intravenous Q24H  . [START ON 05/19/2013] darbepoetin (ARANESP) injection - DIALYSIS  60 mcg Intravenous Q Wed-HD  . folic acid  1 mg Oral Daily  . hydrALAZINE  10 mg Intravenous UD  . sevelamer carbonate  2,400 mg Oral TID WC   . sodium chloride 5 mL/hr at 05/11/13 1900  . diltiazem (CARDIZEM) infusion 5 mg/hr (05/11/13 1415)  . heparin 1,050 Units/hr (05/12/13 0056)   sodium chloride, sodium chloride, acetaminophen, acetaminophen, alteplase, feeding supplement (NEPRO CARB STEADY), feeding supplement (NEPRO CARB STEADY), heparin, heparin, HYDROcodone-acetaminophen, HYDROmorphone (DILAUDID) injection, lidocaine (PF), lidocaine (PF), lidocaine-prilocaine, lidocaine-prilocaine, morphine injection, ondansetron (ZOFRAN) IV, ondansetron (ZOFRAN)  IV, ondansetron, oxyCODONE, pentafluoroprop-tetrafluoroeth pentafluoroprop-tetrafluoroeth, sorbitol, traMADol

## 2013-05-13 DIAGNOSIS — I509 Heart failure, unspecified: Secondary | ICD-10-CM

## 2013-05-13 DIAGNOSIS — I10 Essential (primary) hypertension: Secondary | ICD-10-CM

## 2013-05-13 LAB — CBC
MCH: 29.6 pg (ref 26.0–34.0)
MCHC: 32 g/dL (ref 30.0–36.0)
Platelets: 255 10*3/uL (ref 150–400)
RDW: 13.8 % (ref 11.5–15.5)
WBC: 8.3 10*3/uL (ref 4.0–10.5)

## 2013-05-13 LAB — PROTIME-INR
INR: 1.78 — ABNORMAL HIGH (ref 0.00–1.49)
Prothrombin Time: 20.2 seconds — ABNORMAL HIGH (ref 11.6–15.2)

## 2013-05-13 LAB — HEPARIN LEVEL (UNFRACTIONATED): Heparin Unfractionated: 0.3 IU/mL (ref 0.30–0.70)

## 2013-05-13 MED ORDER — METOPROLOL TARTRATE 25 MG PO TABS
25.0000 mg | ORAL_TABLET | Freq: Two times a day (BID) | ORAL | Status: AC
  Administered 2013-05-13 – 2013-05-19 (×11): 25 mg via ORAL
  Filled 2013-05-13 (×14): qty 1

## 2013-05-13 MED ORDER — LIDOCAINE-PRILOCAINE 2.5-2.5 % EX CREA
1.0000 "application " | TOPICAL_CREAM | CUTANEOUS | Status: DC | PRN
  Filled 2013-05-13: qty 5

## 2013-05-13 MED ORDER — SODIUM CHLORIDE 0.9 % IV SOLN: 100.0000 mL | INTRAVENOUS | Status: DC | PRN

## 2013-05-13 MED ORDER — LIDOCAINE HCL (PF) 1 % IJ SOLN: 5.0000 mL | INTRAMUSCULAR | Status: DC | PRN

## 2013-05-13 MED ORDER — PENTAFLUOROPROP-TETRAFLUOROETH EX AERO: 1.0000 "application " | INHALATION_SPRAY | CUTANEOUS | Status: DC | PRN

## 2013-05-13 MED ORDER — NEPRO/CARBSTEADY PO LIQD
237.0000 mL | ORAL | Status: DC | PRN
  Filled 2013-05-13: qty 237

## 2013-05-13 MED ORDER — ALTEPLASE 2 MG IJ SOLR
2.0000 mg | Freq: Once | INTRAMUSCULAR | Status: AC | PRN
  Filled 2013-05-13: qty 2

## 2013-05-13 MED ORDER — HEPARIN SODIUM (PORCINE) 1000 UNIT/ML DIALYSIS: 1000.0000 [IU] | INTRAMUSCULAR | Status: DC | PRN

## 2013-05-13 NOTE — Progress Notes (Signed)
[facility] Progress Note [place] TEAM 1 - Stepdown/ICU TEAM   [name] UJW:[id number] DOB: [birth year] DOA: 05/07/2013 PCP: [name], MD  Admit HPI / Brief Narrative: 79 yo female with history of atrial fibrillation on Coumadin, HTN, HLD, ESRD on HD MWF, grade II diastolic dysfunction per last 2 D ECHO 11/2012. Fell at home 10/29 and was treated in th ER for a  supracondylar humerus fracture with mild displacement and angulation of distal fragment. Splint was applied by the EDP and appointment made for pt to FU with Dr. [name]. She returned on 10/30 for inadequate pain control. She returned on  10/31 after she was found to be more confused after she had taken 2 tablets of percocet for pain. She apparently took her pain meds after HD then family noted she was somewhat confused. Per ED physician, pt became more alert compared to mental status on arrival. Of note she had presented to the ER 10/14 for palpitations.  Assessment/Plan:  Acute Encephalopathy- toxic  -Likely due to percocet being used for arm pain + UTI - B12 low and folic acid low normal so will begin to replete (see below) - MS appears to have returned to baseline at this time but was troubled by sundowning and insomnia/ improved after low dose Seroquel at HS initiated  ESRD on HD M/W/F Nephrology consulted to continue usual HD   NSTEMI Cardiology following - cath completed 11/4 and needs PTCA/arthrectomy/planned for 11/7  Atrial fibrillation w/ RVR on chronic Coumadin Transitioned to heparin after INR <2.0 on 11/2 - recurred in HD 11/3 and now on Cardizem gtt per Cards-back in NSR 11/4 so Cards dc'd Cardizem gtt but back into AF/RVR 11/5 so gtt resumed and plan to transition to PO 11/6  Grade II diastolic dysfunction Well compensated at present   Supracondylar humerus fracture with mild displacement and angulation of distal fragment s/p fall 10/30 -inadequate pain control so added low dose Duragesic  patch -Appreciate Dr. [name] assistance -XR revealed appropriate alignment of fx's so OK to continue splint and supportive care -will need to reschedule OP appointment for 1 week after dc  Anemia of chronic renal disease iron deficiency and low normal B 12 and folic acid Follow Hgb trend - no evidence of blood loss at present - give s/c B12 and PO Folic acid - will need to consider OP study to determine if cannot absorb B12 - will likely dc on PO B12 until that can be accomplished-allow renal to replete iron  HTN BP is reasonably controlled at this time  E coli UTI Was changed to Rocephin due to prevalence of cipro resistant E coli in community   HLD  Code Status: FULL Family Communication: spoke w/ pt and daughters at bedside  Disposition Plan: SDU until cath/Interventions completed   Consultants: Cardiology Nephrology  Orthopedics  Procedures: none  Antibiotics: Cipro 10/31 >> 11/02 Rocephin 11/02 >>   DVT prophylaxis: IV heparin   HPI/Subjective: Pt left arm pain markedly improved and she slept better overnight. No CP or SOB.  Objective: Blood pressure 110/41, pulse 64, temperature 97.5 F (36.4 C), temperature source Oral, resp. rate 9, height 5\' 5"  (1.651 m), weight 126 lb 1.7 oz (57.2 kg), SpO2 98.00%.  Intake/Output Summary (Last 24 hours) at 05/13/13 1407 Last data filed at 05/13/13 1300  Gross per 24 hour  Intake 1108.5 ml  Output      0 ml  Net 1108.5 ml   Exam: General: No acute respiratory distress  Lungs: Clear to auscultation bilaterally without wheezes or crackles Cardiovascular: Regular rate without murmur gallop or rub  Abdomen: Nontender, nondistended, soft, bowel sounds positive, no rebound, no ascites, no appreciable mass Extremities: No significant cyanosis, clubbing, or edema bilateral lower extremities - L UE in splint   Data Reviewed: Basic Metabolic Panel:  Recent Labs Lab 05/08/13 0412 05/09/13 0200 05/10/13 0910 05/10/13 1318  05/11/13 0405  NA 135 134* 133* 133* 133*  K 4.1 4.3 4.8 4.4 4.2  CL 96 93* 92* 91* 95*  CO2 24 25 23 22 26   GLUCOSE 86 77 75 145* 107*  BUN 33* 48* 67* 69* 32*  CREATININE 5.28* 6.98* 8.76* 9.00* 5.20*  CALCIUM 8.6 8.4 8.0* 8.2* 7.7*  PHOS  --   --  8.5* 8.7*  --    Liver Function Tests:  Recent Labs Lab 05/07/13 1549 05/10/13 0910 05/10/13 1318 05/11/13 0405  AST 18  --   --  16  ALT 7  --   --  5  ALKPHOS 52  --   --  37*  BILITOT 0.4  --   --  0.3  PROT 6.9  --   --  5.6*  ALBUMIN 3.2* 2.7* 2.8* 2.4*   CBC:  Recent Labs Lab 05/07/13 1549  05/10/13 0910 05/10/13 1318 05/11/13 0405 05/12/13 0419 05/13/13 0420  WBC 8.0  < > 9.6 8.7 7.6 7.6 8.3  NEUTROABS 5.6  --   --   --   --   --   --   HGB 11.2*  < > 10.1* 10.2* 8.8* 9.4* 9.4*  HCT 35.0*  < > 30.4* 30.9* 26.2* 29.0* 29.4*  MCV 91.9  < > 88.4 89.0 89.7 91.2 92.5  PLT 229  < > 227 249 210 259 255  < > = values in this interval not displayed. Cardiac Enzymes:  Recent Labs Lab 05/07/13 2041 05/07/13 2303 05/08/13 0142 05/08/13 1420 05/08/13 1958 05/09/13 0200  CKTOTAL  --  200*  --   --   --   --   CKMB  --  13.5*  --   --   --   --   TROPONINI 2.93*  --  7.77* 11.55* 10.88* 7.12*    Recent Results (from the past 240 hour(s))  MRSA PCR SCREENING     Status: None   Collection Time    05/07/13 10:30 PM      Result Value Range Status   MRSA by PCR NEGATIVE  NEGATIVE Final   Comment:            The GeneXpert MRSA Assay (FDA     approved for NASAL specimens     only), is one component of a     comprehensive MRSA colonization     surveillance program. It is not     intended to diagnose MRSA     infection nor to guide or     monitor treatment for     MRSA infections.  URINE CULTURE     Status: None   Collection Time    05/07/13 11:34 PM      Result Value Range Status   Specimen Description URINE, RANDOM   Final   Special Requests NONE   Final   Culture  Setup Time     Final   Value:  05/08/2013 04:05     Performed at [facility]   Colony Count     Final   Value: >=100,000 COLONIES/ML  Performed at [facility]     Final   Value: ESCHERICHIA COLI     Performed at [facility]   Report Status 05/10/2013 FINAL   Final   Organism ID, Bacteria ESCHERICHIA COLI   Final     Studies:  Recent x-ray studies have been reviewed in detail by the Attending Physician  Scheduled Meds:  Scheduled Meds: . aspirin  325 mg Oral Daily  . atorvastatin  40 mg Oral Daily  . cefTRIAXone (ROCEPHIN)  IV  1 g Intravenous Q24H  . [START ON 05/19/2013] darbepoetin (ARANESP) injection - DIALYSIS  60 mcg Intravenous Q Wed-HD  . fentaNYL  12.5 mcg Transdermal Q72H  . folic acid  1 mg Oral Daily  . hydrALAZINE  10 mg Intravenous UD  . metoprolol tartrate  25 mg Oral BID  . QUEtiapine  12.5 mg Oral QHS  . sevelamer carbonate  2,400 mg Oral TID WC    Time spent on care of this patient: 35 mins   ELLIS,[name] ANP  [facility] Office  [phone] Pager - Text Page per Loretha Stapler as per below:  On-Call/Text Page:      [email or web address]      password [email or web address]  If 7PM-7AM, please contact night-coverage [email or web address] Password [email or web address] 05/13/2013, 2:07 PM   LOS: 6 days    I have examined the patient, reviewed the chart and modified the above note which I agree with.   Amanda Steuart,MD [phone] 05/13/2013, 5:19 PM

## 2013-05-13 NOTE — Progress Notes (Signed)
ANTICOAGULATION CONSULT NOTE - Follow Up Consult  Pharmacy Consult for Heparin Indication: chest pain/ACS  No Known Allergies  Patient Measurements: Height: 5\' 5"  (165.1 cm) Weight: 121 lb 0.5 oz (54.9 kg) IBW/kg (Calculated) : 57 Heparin Dosing Weight: 57 kg  Labs:  Recent Labs  05/10/13 0910 05/10/13 1318  05/11/13 0405  05/11/13 1304 05/12/13 0419 05/12/13 1100 05/12/13 2200 05/13/13 0420  HGB 10.1* 10.2*  --  8.8*  --   --  9.4*  --   --  9.4*  HCT 30.4* 30.9*  --  26.2*  --   --  29.0*  --   --  29.4*  PLT 227 249  --  210  --   --  259  --   --  255  LABPROT 22.8*  --   < >  --   < > 19.7* 22.6*  --   --  20.2*  INR 2.09*  --   < >  --   < > 1.72* 2.06*  --   --  1.78*  HEPARINUNFRC 0.10*  --   < >  --   < >  --   --  0.26* 0.21* 0.30  CREATININE 8.76* 9.00*  --  5.20*  --   --   --   --   --   --   < > = values in this interval not displayed.  Estimated Creatinine Clearance: 7.2 ml/min (by C-G formula based on Cr of 5.2).  Assessment:  79 y.o.f  admitted 05/07/2013  with recent fall at home with subsequent fracture brought to ED after she was confused at home after taking 2 percocet tablets for pain. INR at admission was 4.57.  Pharmacy consulted to start heparin after INR down to 1.89  PMH: anemia, HTN, CAD, CKD, AS, afib, CHF   AC:  Afib/ ACS NSTEMI - - given 5mg  IV vit K 10/31. Heparin now therapeutic on 1150 units/hr.  H/h stable, no bleeding noted .  INR 1.78  ID: UTI WBC wnl, afebrile Cipro 11/1>>11/2 CTX 11/2 >  10/31 Urine Cx>>E.Coli -->change to rocephin 10/31 MRSA PCR neg  CV: ACS, CK 200, CK MB 13.5, Trop 11.55>>7.12 (trend down), BP 130/52,  ASA 325mg , lipitor, metop 12.5 bid, statin Endo: AM glucoses ok, TSH 5.5  GI/Nutr: Renal diet   Neuro: mental status back to baseline, narcotic use vs. UTI ; Fent patch, quetiapine,   Renal: ESRD - usually MWF OP.Overall tolerated ~ 3 hrs HD w/ BFR 400.  Pulm: 100% on 2L [place]  Heme/Onc: H/H low,  stabilized, PLT wnl  Best practices: heparin  Goal of Therapy:  Heparin level 0.3-0.7 units/ml Monitor platelets by anticoagulation protocol: Yes   Plan:   Continue heparin at current rate  Daily heparin level and CBC while on Heparin. Follow up plan to resume warfarin after PTCA arthrectomy   Thank you for allowing pharmacy to be a part of this patients care team.  Lovenia Kim Pharm.D., BCPS Clinical Pharmacist 05/13/2013 9:00 AM Pager: [phone] Phone: [phone]

## 2013-05-13 NOTE — Progress Notes (Signed)
[facility] Progress Note    Subjective: Doing well, no complaints  Exam: Blood pressure 122/51, pulse 63, temperature 97.7 F (36.5 C), temperature source Oral, resp. rate 17, height 5\' 5"  (1.651 m), weight 54.9 kg (121 lb 0.5 oz), SpO2 100.00%. Gen: alert, calm, no distress  Resp: clear bilat Cardio: RRR with Gr III/VI systolic murmur, no rub  GI: soft, NT, ND Ext: left arm with ACE wrap and sling, no LE edema  Neuro: NF, ox3  Access: AVF @ LUA with + bruit  Dialysis Orders: MWF [facility]  3hrs  2K/2.5Ca  51.5kg  LUA AVF (BH, both needles up, 15ga)   Heparin none EPO 2200 biw   Hect 3.5ug tiw    Assessment:   1. NSTEMI, s/p cath, CAD x 2 vessels- needs PCI, planned for tomorrow 2. A-fib (chronic) with RVR- iv heparin and diltiazem 3. L elbow fracture- in splint, seen by ortho, plan OP f/u 4. ESRD MWF no hep at center, HD tomorrow or Sat 5. HTN/volume - BP stable; on po dilt here, holding labetalol; get standing wt today 6. Anemia - on darbe 60/wk, no iv Fe d/t high ferritin (1403) 7. Metabolic bone disease - Ca 8.4; Renvela 3 with meals 8. EColi UTI- on rocephin, D#6 of abx 9. Mobility- ordered PT consult   Vinson Moselle MD  pager [phone]    cell [phone]  05/13/2013, 9:46 AM   Recent Labs Lab 05/10/13 0910 05/10/13 1318 05/11/13 0405  NA 133* 133* 133*  K 4.8 4.4 4.2  CL 92* 91* 95*  CO2 23 22 26   GLUCOSE 75 145* 107*  BUN 67* 69* 32*  CREATININE 8.76* 9.00* 5.20*  CALCIUM 8.0* 8.2* 7.7*  PHOS 8.5* 8.7*  --     Recent Labs Lab 05/07/13 1549 05/10/13 0910 05/10/13 1318 05/11/13 0405  AST 18  --   --  16  ALT 7  --   --  5  ALKPHOS 52  --   --  37*  BILITOT 0.4  --   --  0.3  PROT 6.9  --   --  5.6*  ALBUMIN 3.2* 2.7* 2.8* 2.4*    Recent Labs Lab 05/07/13 1549  05/11/13 0405 05/12/13 0419 05/13/13 0420  WBC 8.0  < > 7.6 7.6 8.3  NEUTROABS 5.6  --   --   --   --   HGB 11.2*  < > 8.8* 9.4* 9.4*  HCT 35.0*  < > 26.2*  29.0* 29.4*  MCV 91.9  < > 89.7 91.2 92.5  PLT 229  < > 210 259 255  < > = values in this interval not displayed. Marland Kitchen aspirin  325 mg Oral Daily  . atorvastatin  40 mg Oral Daily  . cefTRIAXone (ROCEPHIN)  IV  1 g Intravenous Q24H  . [START ON 05/19/2013] darbepoetin (ARANESP) injection - DIALYSIS  60 mcg Intravenous Q Wed-HD  . fentaNYL  12.5 mcg Transdermal Q72H  . folic acid  1 mg Oral Daily  . hydrALAZINE  10 mg Intravenous UD  . metoprolol tartrate  12.5 mg Oral BID  . QUEtiapine  12.5 mg Oral QHS  . sevelamer carbonate  2,400 mg Oral TID WC   . sodium chloride 5 mL/hr at 05/11/13 1900  . diltiazem (CARDIZEM) infusion 10 mg/hr (05/13/13 0751)  . heparin 1,150 Units/hr (05/12/13 2348)   acetaminophen, acetaminophen, HYDROcodone-acetaminophen, HYDROmorphone (DILAUDID) injection, morphine injection, ondansetron (ZOFRAN) IV, ondansetron (ZOFRAN) IV, ondansetron, oxyCODONE, sorbitol, traMADol

## 2013-05-13 NOTE — Progress Notes (Signed)
Subjective: Left arm feels better  Objective: Vital signs in last 24 hours: Temp:  [97.6 F (36.4 C)-98.9 F (37.2 C)] 97.7 F (36.5 C) (11/06 0746) Pulse Rate:  [31-124] 63 (11/06 0800) Resp:  [6-23] 17 (11/06 0800) BP: (89-157)/(37-101) 122/51 mmHg (11/06 0800) SpO2:  [97 %-100 %] 100 % (11/06 0800) Weight:  [121 lb 0.5 oz (54.9 kg)] 121 lb 0.5 oz (54.9 kg) (11/05 1016)    Intake/Output from previous day: 11/05 0701 - 11/06 0700 In: 725.5 [P.O.:200; I.V.:475.5; IV Piggyback:50] Out: 1395  Intake/Output this shift: Total I/O In: 20.5 [I.V.:20.5] Out: -   Medications Current Facility-Administered Medications  Medication Dose Route Frequency Provider Last Rate Last Dose  . 0.9 %  sodium chloride infusion   Intravenous Continuous Runell Gess, MD 5 mL/hr at 05/11/13 1900    . acetaminophen (TYLENOL) tablet 650 mg  650 mg Oral Q6H PRN Lonia Blood, MD      . acetaminophen (TYLENOL) tablet 650 mg  650 mg Oral Q4H PRN Runell Gess, MD      . aspirin tablet 325 mg  325 mg Oral Daily Chrystie Nose, MD   325 mg at 05/13/13 0907  . atorvastatin (LIPITOR) tablet 40 mg  40 mg Oral Daily Dorothea Ogle, MD   40 mg at 05/13/13 0907  . cefTRIAXone (ROCEPHIN) 1 g in dextrose 5 % 50 mL IVPB  1 g Intravenous Q24H Lonia Blood, MD   1 g at 05/12/13 1625  . [START ON 05/19/2013] darbepoetin (ARANESP) injection 60 mcg  60 mcg Intravenous Q Wed-HD Maree Krabbe, MD      . diltiazem (CARDIZEM) 100 mg in dextrose 5 % 100 mL infusion  5-15 mg/hr Intravenous Titrated Dorothea Ogle, MD 10 mL/hr at 05/13/13 0751 10 mg/hr at 05/13/13 0751  . fentaNYL (DURAGESIC - dosed mcg/hr) 12.5 mcg  12.5 mcg Transdermal Q72H Russella Dar, NP   12.5 mcg at 05/12/13 1224  . folic acid (FOLVITE) tablet 1 mg  1 mg Oral Daily Calvert Cantor, MD   1 mg at 05/13/13 0907  . heparin ADULT infusion 100 units/mL (25000 units/250 mL)  1,150 Units/hr Intravenous Continuous Lonia Blood, MD 11.5  mL/hr at 05/12/13 2348 1,150 Units/hr at 05/12/13 2348  . hydrALAZINE (APRESOLINE) injection 10 mg  10 mg Intravenous UD Runell Gess, MD      . HYDROcodone-acetaminophen (NORCO/VICODIN) 5-325 MG per tablet 1 tablet  1 tablet Oral Q4H PRN Lonia Blood, MD   1 tablet at 05/13/13 0751  . HYDROmorphone (DILAUDID) injection 0.5 mg  0.5 mg Intravenous Q2H PRN Chrystie Nose, MD   0.5 mg at 05/12/13 2346  . metoprolol tartrate (LOPRESSOR) tablet 12.5 mg  12.5 mg Oral BID Lennette Bihari, MD   12.5 mg at 05/13/13 0907  . morphine 2 MG/ML injection 2 mg  2 mg Intravenous Q1H PRN Runell Gess, MD      . ondansetron [facility]) tablet 4 mg  4 mg Oral Q6H PRN Dorothea Ogle, MD       Or  . ondansetron [facility]) injection 4 mg  4 mg Intravenous Q6H PRN Dorothea Ogle, MD      . ondansetron [facility]) injection 4 mg  4 mg Intravenous Q6H PRN Runell Gess, MD      . oxyCODONE (Oxy IR/ROXICODONE) immediate release tablet 5-10 mg  5-10 mg Oral Q4H PRN Lonia Blood, MD   5  mg at 05/11/13 1730  . QUEtiapine (SEROQUEL) tablet 12.5 mg  12.5 mg Oral QHS Russella Dar, NP   12.5 mg at 05/12/13 2159  . sevelamer carbonate (RENVELA) tablet 2,400 mg  2,400 mg Oral TID WC Dorothea Ogle, MD   2,400 mg at 05/13/13 0907  . sorbitol 70 % solution 30 mL  30 mL Oral QHS PRN Dorothea Ogle, MD      . traMADol Janean Sark) tablet 50 mg  50 mg Oral Q6H PRN Lonia Blood, MD        PE: General appearance: alert, cooperative and no distress  Lungs: clear to auscultation bilaterally  Heart: regular rate and rhythm and 2/6 systolic MM  Extremities: No LEE  Pulses: 2+ and symmetric  Skin: Warm and dry.  Right groin:  Nontender, no ecchymosis or hematoma. Neurologic: Grossly normal  Lab Results:   Recent Labs  05/11/13 0405 05/12/13 0419 05/13/13 0420  WBC 7.6 7.6 8.3  HGB 8.8* 9.4* 9.4*  HCT 26.2* 29.0* 29.4*  PLT 210 259 255   BMET  Recent Labs  05/10/13 1318 05/11/13 0405  NA 133* 133*  K 4.4  4.2  CL 91* 95*  CO2 22 26  GLUCOSE 145* 107*  BUN 69* 32*  CREATININE 9.00* 5.20*  CALCIUM 8.2* 7.7*   PT/INR  Recent Labs  05/11/13 1304 05/12/13 0419 05/13/13 0420  LABPROT 19.7* 22.6* 20.2*  INR 1.72* 2.06* 1.78*    Assessment/Plan    Principal Problem:   Encephalopathy, toxic Active Problems:   Anemia   HTN (hypertension)   ESRD (end stage renal disease) on dialysis   Long term (current) use of anticoagulants   Atrial fibrillation with RVR   NSTEMI (non-ST elevated myocardial infarction)   UTI (urinary tract infection)  Plan:  Needs HSRA of the RCA and OM on Friday.  INR has decreased today to 1.78.  On full dose heparin.  Yesterday afternoon she went back into afib RVR.  IV dilt was adjusted and today she is in SR.  If she continues in SR throughout the day, I will change to PO dilt late this afternoon.      LOS: 6 days    HAGER, BRYAN 05/13/2013 9:14 AM  I have seen and evaluated the patient this AM along with Wilburt Finlay, PA. I agree with his findings, examination as well as impression recommendations.  She looks & feels better today - arm less painful, no Angina.  Back in NSR this AM -- on IV Heparin, INR ~1.78.  Still on IV Dilt -- will keep her on this until post cath  Probably up-titrate BB as well.  Cath showed calcified CAD lesions - plan is HRSA with Dr. Tresa Endo in Am.  On statin + ASA.   On Rocephin for UTI.   Marykay Lex, [name], M.S. [facility] [address] [place], [place]  [address]  [phone] Pager # [phone] 05/13/2013 10:09 AM    \

## 2013-05-13 NOTE — Progress Notes (Signed)
Spoke with pt and family. All verbalize understanding of cath procedure and denies further questions. Consent signed by daughter per pt request. Consent placed in chart.

## 2013-05-14 ENCOUNTER — Encounter (HOSPITAL_COMMUNITY)
Admission: EM | Disposition: A | Discharge status: Home / Self Care | Payer: Self-pay | Source: Home / Self Care | Attending: Internal Medicine

## 2013-05-14 DIAGNOSIS — I2510 Atherosclerotic heart disease of native coronary artery without angina pectoris: Secondary | ICD-10-CM

## 2013-05-14 HISTORY — PX: PERCUTANEOUS CORONARY ROTOBLATOR INTERVENTION (PCI-R): SHX5484

## 2013-05-14 LAB — CBC
HCT: 31 % — ABNORMAL LOW (ref 36.0–46.0)
MCH: 29.2 pg (ref 26.0–34.0)
MCHC: 31.6 g/dL (ref 30.0–36.0)
MCV: 92.3 fL (ref 78.0–100.0)
Platelets: 270 10*3/uL (ref 150–400)
RBC: 3.36 MIL/uL — ABNORMAL LOW (ref 3.87–5.11)

## 2013-05-14 LAB — RENAL FUNCTION PANEL
Albumin: 2.6 g/dL — ABNORMAL LOW (ref 3.5–5.2)
CO2: 28 mEq/L (ref 19–32)
Calcium: 8.8 mg/dL (ref 8.4–10.5)
Creatinine, Ser: 6.05 mg/dL — ABNORMAL HIGH (ref 0.50–1.10)
GFR calc Af Amer: 7 mL/min — ABNORMAL LOW (ref >90)
GFR calc non Af Amer: 6 mL/min — ABNORMAL LOW (ref >90)
Glucose, Bld: 91 mg/dL (ref 70–99)
Phosphorus: 5 mg/dL — ABNORMAL HIGH (ref 2.3–4.6)
Sodium: 130 mEq/L — ABNORMAL LOW (ref 135–145)

## 2013-05-14 SURGERY — PERCUTANEOUS CORONARY ROTOBLATOR INTERVENTION (PCI-R)
Anesthesia: LOCAL

## 2013-05-14 MED ORDER — CLOPIDOGREL BISULFATE 75 MG PO TABS
75.0000 mg | ORAL_TABLET | Freq: Every day | ORAL | Status: DC
  Administered 2013-05-15 – 2013-05-22 (×8): 75 mg via ORAL
  Filled 2013-05-14 (×9): qty 1

## 2013-05-14 MED ORDER — DILTIAZEM HCL 100 MG IV SOLR
5.0000 mg/h | INTRAVENOUS | Status: DC
  Administered 2013-05-15: 5 mg/h via INTRAVENOUS
  Filled 2013-05-14 (×2): qty 100

## 2013-05-14 MED ORDER — NITROGLYCERIN 0.2 MG/ML ON CALL CATH LAB
INTRAVENOUS | Status: AC
  Filled 2013-05-14: qty 1

## 2013-05-14 MED ORDER — VERAPAMIL HCL 2.5 MG/ML IV SOLN
INTRAVENOUS | Status: AC
  Filled 2013-05-14: qty 4

## 2013-05-14 MED ORDER — QUETIAPINE 12.5 MG HALF TABLET
12.5000 mg | ORAL_TABLET | Freq: Every evening | ORAL | Status: DC | PRN
  Administered 2013-05-21: 12.5 mg via ORAL
  Filled 2013-05-14 (×2): qty 1

## 2013-05-14 MED ORDER — MIDAZOLAM HCL 2 MG/2ML IJ SOLN
INTRAMUSCULAR | Status: AC
  Filled 2013-05-14: qty 2

## 2013-05-14 MED ORDER — HEPARIN (PORCINE) IN NACL 2-0.9 UNIT/ML-% IJ SOLN
INTRAMUSCULAR | Status: AC
  Filled 2013-05-14: qty 1000

## 2013-05-14 MED ORDER — NOREPINEPHRINE BITARTRATE 1 MG/ML IJ SOLN
INTRAMUSCULAR | Status: AC
  Filled 2013-05-14: qty 4

## 2013-05-14 MED ORDER — HEPARIN SODIUM (PORCINE) 1000 UNIT/ML IJ SOLN
INTRAMUSCULAR | Status: AC
  Filled 2013-05-14: qty 1

## 2013-05-14 MED ORDER — BIVALIRUDIN 250 MG IV SOLR
INTRAVENOUS | Status: AC
  Filled 2013-05-14: qty 250

## 2013-05-14 MED ORDER — ASPIRIN EC 81 MG PO TBEC
81.0000 mg | DELAYED_RELEASE_TABLET | Freq: Every day | ORAL | Status: DC
  Administered 2013-05-16 – 2013-05-22 (×5): 81 mg via ORAL
  Filled 2013-05-14 (×4): qty 1

## 2013-05-14 MED ORDER — LIDOCAINE HCL (PF) 1 % IJ SOLN
INTRAMUSCULAR | Status: AC
  Filled 2013-05-14: qty 30

## 2013-05-14 MED ORDER — FENTANYL CITRATE 0.05 MG/ML IJ SOLN
INTRAMUSCULAR | Status: AC
  Filled 2013-05-14: qty 2

## 2013-05-14 MED ORDER — SODIUM CHLORIDE 0.9 % IV SOLN: INTRAVENOUS | Status: DC

## 2013-05-14 MED ORDER — ACETAMINOPHEN 325 MG PO TABS
650.0000 mg | ORAL_TABLET | ORAL | Status: DC | PRN
  Administered 2013-05-15: 650 mg via ORAL

## 2013-05-14 MED ORDER — ONDANSETRON HCL 4 MG/2ML IJ SOLN: 4.0000 mg | Freq: Four times a day (QID) | INTRAMUSCULAR | Status: DC | PRN

## 2013-05-14 MED ORDER — CLOPIDOGREL BISULFATE 300 MG PO TABS
ORAL_TABLET | ORAL | Status: AC
  Filled 2013-05-14: qty 1

## 2013-05-14 MED ORDER — DOPAMINE-DEXTROSE 3.2-5 MG/ML-% IV SOLN
INTRAVENOUS | Status: AC
  Filled 2013-05-14: qty 250

## 2013-05-14 NOTE — H&P (View-Only) (Signed)
  Subjective: Pt feels "funny". States she feels confused after taking sleeping pill last night. Her daughter is by her bedside. She denies CP/SOB.  Objective: Vital signs in last 24 hours: Temp:  [97.5 F (36.4 C)-98.7 F (37.1 C)] 98.7 F (37.1 C) (11/07 0800) Pulse Rate:  [58-75] 68 (11/07 0800) Resp:  [8-18] 18 (11/07 0800) BP: (104-150)/(39-60) 150/45 mmHg (11/07 0800) SpO2:  [74 %-100 %] 97 % (11/07 0800) Weight:  [126 lb 1.7 oz (57.2 kg)] 126 lb 1.7 oz (57.2 kg) (11/06 1000)    Intake/Output from previous day: 11/06 0701 - 11/07 0700 In: 1157 [P.O.:600; I.V.:507; IV Piggyback:50] Out: -  Intake/Output this shift: Total I/O In: 21.5 [I.V.:21.5] Out: -   Medications Current Facility-Administered Medications  Medication Dose Route Frequency Provider Last Rate Last Dose  . 0.9 %  sodium chloride infusion   Intravenous Continuous Jonathan J Berry, MD 5 mL/hr at 05/11/13 1900    . acetaminophen (TYLENOL) tablet 650 mg  650 mg Oral Q6H PRN Jeffrey T McClung, MD      . acetaminophen (TYLENOL) tablet 650 mg  650 mg Oral Q4H PRN Jonathan J Berry, MD      . aspirin tablet 325 mg  325 mg Oral Daily Kenneth C. Hilty, MD   325 mg at 05/13/13 0907  . atorvastatin (LIPITOR) tablet 40 mg  40 mg Oral Daily Iskra M Myers, MD   40 mg at 05/13/13 0907  . cefTRIAXone (ROCEPHIN) 1 g in dextrose 5 % 50 mL IVPB  1 g Intravenous Q24H Jeffrey T McClung, MD   1 g at 05/13/13 1708  . [START ON 05/19/2013] darbepoetin (ARANESP) injection 60 mcg  60 mcg Intravenous Q Wed-HD Robert D Schertz, MD      . diltiazem (CARDIZEM) 100 mg in dextrose 5 % 100 mL infusion  5-15 mg/hr Intravenous Titrated Iskra M Myers, MD 10 mL/hr at 05/14/13 0324 10 mg/hr at 05/14/13 0324  . feeding supplement (NEPRO CARB STEADY) liquid 237 mL  237 mL Oral PRN Robert D Schertz, MD      . fentaNYL (DURAGESIC - dosed mcg/hr) 12.5 mcg  12.5 mcg Transdermal Q72H Allison L Ellis, NP   12.5 mcg at 05/12/13 1224  . folic acid  (FOLVITE) tablet 1 mg  1 mg Oral Daily Saima Rizwan, MD   1 mg at 05/13/13 0907  . heparin ADULT infusion 100 units/mL (25000 units/250 mL)  1,150 Units/hr Intravenous Continuous Jeffrey T McClung, MD 11.5 mL/hr at 05/13/13 1709 1,150 Units/hr at 05/13/13 1709  . heparin injection 1,000 Units  1,000 Units Dialysis PRN Robert D Schertz, MD      . hydrALAZINE (APRESOLINE) injection 10 mg  10 mg Intravenous UD Jonathan J Berry, MD      . HYDROcodone-acetaminophen (NORCO/VICODIN) 5-325 MG per tablet 1 tablet  1 tablet Oral Q4H PRN Jeffrey T McClung, MD   1 tablet at 05/13/13 2106  . HYDROmorphone (DILAUDID) injection 0.5 mg  0.5 mg Intravenous Q2H PRN Kenneth C. Hilty, MD   0.5 mg at 05/13/13 1145  . lidocaine (PF) (XYLOCAINE) 1 % injection 5 mL  5 mL Intradermal PRN Robert D Schertz, MD      . lidocaine-prilocaine (EMLA) cream 1 application  1 application Topical PRN Robert D Schertz, MD      . metoprolol tartrate (LOPRESSOR) tablet 25 mg  25 mg Oral BID David W Harding, MD   25 mg at 05/13/13 2106  . ondansetron (ZOFRAN) tablet 4 mg    4 mg Oral Q6H PRN Iskra M Myers, MD       Or  . ondansetron (ZOFRAN) injection 4 mg  4 mg Intravenous Q6H PRN Iskra M Myers, MD      . ondansetron (ZOFRAN) injection 4 mg  4 mg Intravenous Q6H PRN Jonathan J Berry, MD      . oxyCODONE (Oxy IR/ROXICODONE) immediate release tablet 5-10 mg  5-10 mg Oral Q4H PRN Jeffrey T McClung, MD   5 mg at 05/11/13 1730  . pentafluoroprop-tetrafluoroeth (GEBAUERS) aerosol 1 application  1 application Topical PRN Robert D Schertz, MD      . QUEtiapine (SEROQUEL) tablet 12.5 mg  12.5 mg Oral QHS PRN Robert D Schertz, MD      . sevelamer carbonate (RENVELA) tablet 2,400 mg  2,400 mg Oral TID WC Iskra M Myers, MD   2,400 mg at 05/13/13 1708  . sorbitol 70 % solution 30 mL  30 mL Oral QHS PRN Iskra M Myers, MD      . traMADol (ULTRAM) tablet 50 mg  50 mg Oral Q6H PRN Jeffrey T McClung, MD        PE: General appearance: alert, cooperative  and no distress No JVD Lungs: clear to auscultation bilaterally Heart: regular rate and rhythm and 3/6 SM Extremities: no LEE Pulses: 2+ and symmetric Skin: warm and dry Neurologic: Grossly normal  Lab Results:   Recent Labs  05/12/13 0419 05/13/13 0420 05/14/13 0447  WBC 7.6 8.3 9.5  HGB 9.4* 9.4* 9.8*  HCT 29.0* 29.4* 31.0*  PLT 259 255 270   BMET  Recent Labs  05/14/13 0447  NA 130*  K 4.6  CL 92*  CO2 28  GLUCOSE 91  BUN 34*  CREATININE 6.05*  CALCIUM 8.8   PT/INR  Recent Labs  05/12/13 0419 05/13/13 0420 05/14/13 0447  LABPROT 22.6* 20.2* 18.1*  INR 2.06* 1.78* 1.54*    Assessment/Plan    Principal Problem:   Encephalopathy, toxic Active Problems:   Anemia   HTN (hypertension)   ESRD (end stage renal disease) on dialysis   Long term (current) use of anticoagulants   Atrial fibrillation with RVR   NSTEMI (non-ST elevated myocardial infarction)   UTI (urinary tract infection)  Plan:  Plan for HSRA of the RCA and OM today with Dr. Jalesa Thien. INR ok for cath at 1.54. D/c heparin on call to cath lab. She remains in NSR on telemetry. She continues on IV Cardizem. If she remains in NSR post cath, can convert to PO Cardizem. Pt has already been seen by Nephrology today. The plan is to cancel HD for today and reschedule for tomorrow. Seroquel order has been changed by Renal MD to PRN. Dr. Betti Goodenow to follow.     LOS: 7 days    Brittainy M. Simmons, PA-C 05/14/2013 8:55 AM   Patient seen and examined. Agree with assessment and plan. Long discussion with patient and family. Cines again reviewed. Subtotal proximal RCA stenosi as well as stenosis near acute margin; all calcified. Also with total mid AV LCX occlusion but large OM1 vessel more proximally with significant eccentrically calcified very proximal OM1 stenosis. Will probably need staged procedure, with plan for temporary pacemaker and RCA PCI today. Discussed risks and benefits witgh pt and family who  wish to proceed. Dialysis is planned for tomorrow.   Cuthrell A. Criston Chancellor, MD, FACC 05/14/2013 9:20 AM   

## 2013-05-14 NOTE — Progress Notes (Signed)
[facility] Progress Note    Subjective: Doing well, no complaints  Exam: Blood pressure 150/45, pulse 68, temperature 98.7 F (37.1 C), temperature source Oral, resp. rate 18, height 5\' 5"  (1.651 m), weight 57.2 kg (126 lb 1.7 oz), SpO2 97.00%. Gen: alert, calm, no distress  Resp: clear bilat Cardio: RRR with Gr III/VI systolic murmur, no rub  GI: soft, NT, ND Ext: left arm with ACE wrap and sling, no LE edema  Neuro: NF, ox3  Access: AVF @ LUA with + bruit  Dialysis Orders: MWF [facility]  3hrs  2K/2.5Ca  51.5kg  LUA AVF (BH, both needles up, 15ga)   Heparin none EPO 2200 biw   Hect 3.5ug tiw    Assessment:   1. NSTEMI / 2V CAD- for PCI today 2. AMS- mild, from scheduled pm seroquel most likely 3. A-fib (chronic) with RVR- iv heparin and diltiazem 4. ESRD MWF no hep at center, HD tomorrow or Sat 5. HTN/volume - BP stable; on po dilt here, holding labetalol; get standing wt today 6. Anemia - on darbe 60/wk, no iv Fe d/t high ferritin (1403) 7. Metabolic bone disease - Ca 8.4; Renvela 3 with meals 8. L elbow fracture- in splint, seen by ortho, plan OP f/u 9. EColi UTI- on rocephin, D#7 of abx 10. Mobility- ordered PT consult  Rec- changed seroquel to prn at night, no HD today, plan HD Sat, ?d/c abx for UTI (7th day today)   Vinson Moselle MD  pager [phone]    cell [phone]  05/14/2013, 8:26 AM   Recent Labs Lab 05/10/13 0910 05/10/13 1318 05/11/13 0405 05/14/13 0447  NA 133* 133* 133* 130*  K 4.8 4.4 4.2 4.6  CL 92* 91* 95* 92*  CO2 23 22 26 28   GLUCOSE 75 145* 107* 91  BUN 67* 69* 32* 34*  CREATININE 8.76* 9.00* 5.20* 6.05*  CALCIUM 8.0* 8.2* 7.7* 8.8  PHOS 8.5* 8.7*  --  5.0*    Recent Labs Lab 05/07/13 1549  05/10/13 1318 05/11/13 0405 05/14/13 0447  AST 18  --   --  16  --   ALT 7  --   --  5  --   ALKPHOS 52  --   --  37*  --   BILITOT 0.4  --   --  0.3  --   PROT 6.9  --   --  5.6*  --   ALBUMIN 3.2*  < > 2.8* 2.4*  2.6*  < > = values in this interval not displayed.  Recent Labs Lab 05/07/13 1549  05/12/13 0419 05/13/13 0420 05/14/13 0447  WBC 8.0  < > 7.6 8.3 9.5  NEUTROABS 5.6  --   --   --   --   HGB 11.2*  < > 9.4* 9.4* 9.8*  HCT 35.0*  < > 29.0* 29.4* 31.0*  MCV 91.9  < > 91.2 92.5 92.3  PLT 229  < > 259 255 270  < > = values in this interval not displayed. Marland Kitchen aspirin  325 mg Oral Daily  . atorvastatin  40 mg Oral Daily  . cefTRIAXone (ROCEPHIN)  IV  1 g Intravenous Q24H  . [START ON 05/19/2013] darbepoetin (ARANESP) injection - DIALYSIS  60 mcg Intravenous Q Wed-HD  . fentaNYL  12.5 mcg Transdermal Q72H  . folic acid  1 mg Oral Daily  . hydrALAZINE  10 mg Intravenous UD  . metoprolol tartrate  25 mg Oral BID  . QUEtiapine  12.5 mg Oral QHS  . sevelamer carbonate  2,400 mg Oral TID WC   . sodium chloride 5 mL/hr at 05/11/13 1900  . diltiazem (CARDIZEM) infusion 10 mg/hr (05/14/13 0324)  . heparin 1,150 Units/hr (05/13/13 1709)   sodium chloride, sodium chloride, acetaminophen, acetaminophen, feeding supplement (NEPRO CARB STEADY), heparin, HYDROcodone-acetaminophen, HYDROmorphone (DILAUDID) injection, lidocaine (PF), lidocaine-prilocaine, morphine injection, ondansetron (ZOFRAN) IV, ondansetron (ZOFRAN) IV, ondansetron, oxyCODONE, pentafluoroprop-tetrafluoroeth, sorbitol, traMADol

## 2013-05-14 NOTE — Interval H&P Note (Signed)
Cath Lab Visit (complete for each Cath Lab visit)  Clinical Evaluation Leading to the Procedure:   ACS: yes  Non-ACS:    Anginal Classification: CCS IV  Anti-ischemic medical therapy: Maximal Therapy (2 or more classes of medications)  Non-Invasive Test Results: No non-invasive testing performed  Prior CABG: No previous CABG      History and Physical Interval Note:  05/14/2013 12:55 PM  [name]  has presented today for surgery, with the diagnosis of CAD  The various methods of treatment have been discussed with the patient and family. After consideration of risks, benefits and other options for treatment, the patient has consented to  Procedure(s): PERCUTANEOUS CORONARY ROTOBLATOR INTERVENTION (PCI-R) (N/A) as a surgical intervention .  The patient's history has been reviewed, patient examined, no change in status, stable for surgery.  I have reviewed the patient's chart and labs.  Questions were answered to the patient's satisfaction.     Shakeel Disney,Lafuente A

## 2013-05-14 NOTE — Progress Notes (Signed)
Subjective: Pt feels "funny". States she feels confused after taking sleeping pill last night. Her daughter is by her bedside. She denies CP/SOB.  Objective: Vital signs in last 24 hours: Temp:  [97.5 F (36.4 C)-98.7 F (37.1 C)] 98.7 F (37.1 C) (11/07 0800) Pulse Rate:  [58-75] 68 (11/07 0800) Resp:  [8-18] 18 (11/07 0800) BP: (104-150)/(39-60) 150/45 mmHg (11/07 0800) SpO2:  [74 %-100 %] 97 % (11/07 0800) Weight:  [126 lb 1.7 oz (57.2 kg)] 126 lb 1.7 oz (57.2 kg) (11/06 1000)    Intake/Output from previous day: 11/06 0701 - 11/07 0700 In: 1157 [P.O.:600; I.V.:507; IV Piggyback:50] Out: -  Intake/Output this shift: Total I/O In: 21.5 [I.V.:21.5] Out: -   Medications Current Facility-Administered Medications  Medication Dose Route Frequency Provider Last Rate Last Dose  . 0.9 %  sodium chloride infusion   Intravenous Continuous Runell Gess, MD 5 mL/hr at 05/11/13 1900    . acetaminophen (TYLENOL) tablet 650 mg  650 mg Oral Q6H PRN Lonia Blood, MD      . acetaminophen (TYLENOL) tablet 650 mg  650 mg Oral Q4H PRN Runell Gess, MD      . aspirin tablet 325 mg  325 mg Oral Daily Chrystie Nose, MD   325 mg at 05/13/13 0907  . atorvastatin (LIPITOR) tablet 40 mg  40 mg Oral Daily Dorothea Ogle, MD   40 mg at 05/13/13 0907  . cefTRIAXone (ROCEPHIN) 1 g in dextrose 5 % 50 mL IVPB  1 g Intravenous Q24H Lonia Blood, MD   1 g at 05/13/13 1708  . [START ON 05/19/2013] darbepoetin (ARANESP) injection 60 mcg  60 mcg Intravenous Q Wed-HD Maree Krabbe, MD      . diltiazem (CARDIZEM) 100 mg in dextrose 5 % 100 mL infusion  5-15 mg/hr Intravenous Titrated Dorothea Ogle, MD 10 mL/hr at 05/14/13 0324 10 mg/hr at 05/14/13 0324  . feeding supplement (NEPRO CARB STEADY) liquid 237 mL  237 mL Oral PRN Maree Krabbe, MD      . fentaNYL (DURAGESIC - dosed mcg/hr) 12.5 mcg  12.5 mcg Transdermal Q72H Russella Dar, NP   12.5 mcg at 05/12/13 1224  . folic acid  (FOLVITE) tablet 1 mg  1 mg Oral Daily Calvert Cantor, MD   1 mg at 05/13/13 0907  . heparin ADULT infusion 100 units/mL (25000 units/250 mL)  1,150 Units/hr Intravenous Continuous Lonia Blood, MD 11.5 mL/hr at 05/13/13 1709 1,150 Units/hr at 05/13/13 1709  . heparin injection 1,000 Units  1,000 Units Dialysis PRN Maree Krabbe, MD      . hydrALAZINE (APRESOLINE) injection 10 mg  10 mg Intravenous UD Runell Gess, MD      . HYDROcodone-acetaminophen (NORCO/VICODIN) 5-325 MG per tablet 1 tablet  1 tablet Oral Q4H PRN Lonia Blood, MD   1 tablet at 05/13/13 2106  . HYDROmorphone (DILAUDID) injection 0.5 mg  0.5 mg Intravenous Q2H PRN Chrystie Nose, MD   0.5 mg at 05/13/13 1145  . lidocaine (PF) (XYLOCAINE) 1 % injection 5 mL  5 mL Intradermal PRN Maree Krabbe, MD      . lidocaine-prilocaine (EMLA) cream 1 application  1 application Topical PRN Maree Krabbe, MD      . metoprolol tartrate (LOPRESSOR) tablet 25 mg  25 mg Oral BID Marykay Lex, MD   25 mg at 05/13/13 2106  . ondansetron (ZOFRAN) tablet 4 mg  4 mg Oral Q6H PRN Dorothea Ogle, MD       Or  . ondansetron [facility]) injection 4 mg  4 mg Intravenous Q6H PRN Dorothea Ogle, MD      . ondansetron [facility]) injection 4 mg  4 mg Intravenous Q6H PRN Runell Gess, MD      . oxyCODONE (Oxy IR/ROXICODONE) immediate release tablet 5-10 mg  5-10 mg Oral Q4H PRN Lonia Blood, MD   5 mg at 05/11/13 1730  . pentafluoroprop-tetrafluoroeth (GEBAUERS) aerosol 1 application  1 application Topical PRN Maree Krabbe, MD      . QUEtiapine (SEROQUEL) tablet 12.5 mg  12.5 mg Oral QHS PRN Maree Krabbe, MD      . sevelamer carbonate (RENVELA) tablet 2,400 mg  2,400 mg Oral TID WC Dorothea Ogle, MD   2,400 mg at 05/13/13 1708  . sorbitol 70 % solution 30 mL  30 mL Oral QHS PRN Dorothea Ogle, MD      . traMADol Janean Sark) tablet 50 mg  50 mg Oral Q6H PRN Lonia Blood, MD        PE: General appearance: alert, cooperative  and no distress No JVD Lungs: clear to auscultation bilaterally Heart: regular rate and rhythm and 3/6 SM Extremities: no LEE Pulses: 2+ and symmetric Skin: warm and dry Neurologic: Grossly normal  Lab Results:   Recent Labs  05/12/13 0419 05/13/13 0420 05/14/13 0447  WBC 7.6 8.3 9.5  HGB 9.4* 9.4* 9.8*  HCT 29.0* 29.4* 31.0*  PLT 259 255 270   BMET  Recent Labs  05/14/13 0447  NA 130*  K 4.6  CL 92*  CO2 28  GLUCOSE 91  BUN 34*  CREATININE 6.05*  CALCIUM 8.8   PT/INR  Recent Labs  05/12/13 0419 05/13/13 0420 05/14/13 0447  LABPROT 22.6* 20.2* 18.1*  INR 2.06* 1.78* 1.54*    Assessment/Plan    Principal Problem:   Encephalopathy, toxic Active Problems:   Anemia   HTN (hypertension)   ESRD (end stage renal disease) on dialysis   Long term (current) use of anticoagulants   Atrial fibrillation with RVR   NSTEMI (non-ST elevated myocardial infarction)   UTI (urinary tract infection)  Plan:  Plan for HSRA of the RCA and OM today with Dr. Tresa Endo. INR ok for cath at 1.54. D/c heparin on call to cath lab. She remains in NSR on telemetry. She continues on IV Cardizem. If she remains in NSR post cath, can convert to PO Cardizem. Pt has already been seen by Nephrology today. The plan is to cancel HD for today and reschedule for tomorrow. Seroquel order has been changed by Renal MD to PRN. Dr. Tresa Endo to follow.     LOS: 7 days    Brittainy M. Delmer Islam 05/14/2013 8:55 AM   Patient seen and examined. Agree with assessment and plan. Long discussion with patient and family. Cines again reviewed. Subtotal proximal RCA stenosi as well as stenosis near acute margin; all calcified. Also with total mid AV LCX occlusion but large OM1 vessel more proximally with significant eccentrically calcified very proximal OM1 stenosis. Will probably need staged procedure, with plan for temporary pacemaker and RCA PCI today. Discussed risks and benefits witgh pt and family who  wish to proceed. Dialysis is planned for tomorrow.   Lennette Bihari, MD, [facility] 05/14/2013 9:20 AM

## 2013-05-14 NOTE — Progress Notes (Signed)
[facility] Progress Note [place] TEAM 1 - Stepdown/ICU TEAM   RASHAUN CURL ZOX:[id number] DOB: [birth year] DOA: 05/07/2013 PCP: [name], MD  Admit HPI / Brief Narrative: 79 yo female with history of atrial fibrillation on Coumadin, HTN, HLD, ESRD on HD MWF, grade II diastolic dysfunction per last 2 D ECHO 11/2012. Fell at home 10/29 and was treated in th ER for a  supracondylar humerus fracture with mild displacement and angulation of distal fragment. Splint was applied by the EDP and appointment made for pt to FU with Dr. [name]. She returned on 10/30 for inadequate pain control. She returned on  10/31 after she was found to be more confused after she had taken 2 tablets of percocet for pain. She apparently took her pain meds after HD then family noted she was somewhat confused. Per ED physician, pt became more alert compared to mental status on arrival. Of note she had presented to the ER 10/14 for palpitations.  Assessment/Plan:  Acute Encephalopathy- toxic  -Likely due to percocet being used for arm pain + UTI - B12 low and folic acid low normal so will begin to replete (see below) - MS appears to have returned to baseline at this time but was troubled by sundowning and insomnia/ improved after low dose Seroquel at HS initiated but 11/7 am pt too sedated so Nephro stopped Seroquel  ESRD on HD M/W/F Nephrology consulted to continue usual HD   NSTEMI Cardiology following - cath completed 11/4 and needs PTCA/arthrectomy/planned for 11/7-likely staged procedure and temporary pacer  Atrial fibrillation w/ RVR on chronic Coumadin Transitioned to heparin after INR <2.0 on 11/2 - recurred in HD 11/3 and now on Cardizem gtt per Cards-back in NSR 11/4 so Cards dc'd Cardizem gtt but back into AF/RVR 11/5 so gtt resumed and plan to transition to PO 11/6  Grade II diastolic dysfunction Well compensated at present   Supracondylar humerus fracture with mild displacement and  angulation of distal fragment s/p fall 10/30 -inadequate pain control so added low dose Duragesic patch -Appreciate Dr. [name] assistance -XR revealed appropriate alignment of fx's so OK to continue splint and supportive care -will need to reschedule OP appointment for 1 week after dc  Anemia of chronic renal disease iron deficiency and low normal B 12 and folic acid Follow Hgb trend - no evidence of blood loss at present - give s/c B12 and PO Folic acid - will need to consider OP study to determine if cannot absorb B12 - will likely dc on PO B12 until that can be accomplished-allow renal to replete iron  HTN BP is reasonably controlled at this time  E coli UTI Was changed to Rocephin due to prevalence of cipro resistant E coli in community   HLD  Code Status: FULL Family Communication: spoke w/ pt and daughters at bedside  Disposition Plan: SDU until cath/Interventions completed   Consultants: Cardiology Nephrology  Orthopedics  Procedures: none  Antibiotics: Cipro 10/31 >> 11/02 Rocephin 11/02 >>   DVT prophylaxis: IV heparin   HPI/Subjective: Pt left arm pain markedly improved and she slept better overnight. No CP or SOB.  Objective: Blood pressure 150/45, pulse 68, temperature 98.7 F (37.1 C), temperature source Oral, resp. rate 18, height 5\' 5"  (1.651 m), weight 126 lb 1.7 oz (57.2 kg), SpO2 97.00%.  Intake/Output Summary (Last 24 hours) at 05/14/13 1058 Last data filed at 05/14/13 1000  Gross per 24 hour  Intake    920 ml  Output  0 ml  Net    920 ml   Exam: General: No acute respiratory distress Lungs: Clear to auscultation bilaterally without wheezes or crackles Cardiovascular: Regular rate without murmur gallop or rub  Abdomen: Nontender, nondistended, soft, bowel sounds positive, no rebound, no ascites, no appreciable mass Extremities: No significant cyanosis, clubbing, or edema bilateral lower extremities - L UE in splint   Data  Reviewed: Basic Metabolic Panel:  Recent Labs Lab 05/09/13 0200 05/10/13 0910 05/10/13 1318 05/11/13 0405 05/14/13 0447  NA 134* 133* 133* 133* 130*  K 4.3 4.8 4.4 4.2 4.6  CL 93* 92* 91* 95* 92*  CO2 25 23 22 26 28   GLUCOSE 77 75 145* 107* 91  BUN 48* 67* 69* 32* 34*  CREATININE 6.98* 8.76* 9.00* 5.20* 6.05*  CALCIUM 8.4 8.0* 8.2* 7.7* 8.8  PHOS  --  8.5* 8.7*  --  5.0*   Liver Function Tests:  Recent Labs Lab 05/07/13 1549 05/10/13 0910 05/10/13 1318 05/11/13 0405 05/14/13 0447  AST 18  --   --  16  --   ALT 7  --   --  5  --   ALKPHOS 52  --   --  37*  --   BILITOT 0.4  --   --  0.3  --   PROT 6.9  --   --  5.6*  --   ALBUMIN 3.2* 2.7* 2.8* 2.4* 2.6*   CBC:  Recent Labs Lab 05/07/13 1549  05/10/13 1318 05/11/13 0405 05/12/13 0419 05/13/13 0420 05/14/13 0447  WBC 8.0  < > 8.7 7.6 7.6 8.3 9.5  NEUTROABS 5.6  --   --   --   --   --   --   HGB 11.2*  < > 10.2* 8.8* 9.4* 9.4* 9.8*  HCT 35.0*  < > 30.9* 26.2* 29.0* 29.4* 31.0*  MCV 91.9  < > 89.0 89.7 91.2 92.5 92.3  PLT 229  < > 249 210 259 255 270  < > = values in this interval not displayed. Cardiac Enzymes:  Recent Labs Lab 05/07/13 2041 05/07/13 2303 05/08/13 0142 05/08/13 1420 05/08/13 1958 05/09/13 0200  CKTOTAL  --  200*  --   --   --   --   CKMB  --  13.5*  --   --   --   --   TROPONINI 2.93*  --  7.77* 11.55* 10.88* 7.12*    Recent Results (from the past 240 hour(s))  MRSA PCR SCREENING     Status: None   Collection Time    05/07/13 10:30 PM      Result Value Range Status   MRSA by PCR NEGATIVE  NEGATIVE Final   Comment:            The GeneXpert MRSA Assay (FDA     approved for NASAL specimens     only), is one component of a     comprehensive MRSA colonization     surveillance program. It is not     intended to diagnose MRSA     infection nor to guide or     monitor treatment for     MRSA infections.  URINE CULTURE     Status: None   Collection Time    05/07/13 11:34 PM       Result Value Range Status   Specimen Description URINE, RANDOM   Final   Special Requests NONE   Final   Culture  Setup Time  Final   Value: 05/08/2013 04:05     Performed at [facility] Count     Final   Value: >=100,000 COLONIES/ML     Performed at [facility]   Culture     Final   Value: ESCHERICHIA COLI     Performed at [facility]   Report Status 05/10/2013 FINAL   Final   Organism ID, Bacteria ESCHERICHIA COLI   Final     Studies:  Recent x-ray studies have been reviewed in detail by the Attending Physician  Scheduled Meds:  Scheduled Meds: . aspirin  325 mg Oral Daily  . atorvastatin  40 mg Oral Daily  . cefTRIAXone (ROCEPHIN)  IV  1 g Intravenous Q24H  . [START ON 05/19/2013] darbepoetin (ARANESP) injection - DIALYSIS  60 mcg Intravenous Q Wed-HD  . fentaNYL  12.5 mcg Transdermal Q72H  . folic acid  1 mg Oral Daily  . hydrALAZINE  10 mg Intravenous UD  . metoprolol tartrate  25 mg Oral BID  . sevelamer carbonate  2,400 mg Oral TID WC    Time spent on care of this patient: 35 mins   ELLIS,[name] ANP  [facility] Office  [phone] Pager - Text Page per Loretha Stapler as per below:  On-Call/Text Page:      [email or web address]      password [email or web address]  If 7PM-7AM, please contact night-coverage [email or web address] Password [email or web address] 05/14/2013, 10:58 AM   LOS: 7 days        I have examined the patient, reviewed the chart and modified the above note which I agree with.   Darleth Eustache,MD [phone] 05/14/2013, 1:35 PM

## 2013-05-14 NOTE — Progress Notes (Signed)
ANTICOAGULATION CONSULT NOTE - Follow Up Consult  Pharmacy Consult for Heparin Indication: chest pain/ACS  No Known Allergies  Patient Measurements: Height: 5\' 5"  (165.1 cm) Weight: 126 lb 1.7 oz (57.2 kg) IBW/kg (Calculated) : 57 Heparin Dosing Weight: 57kg  Vital Signs: Temp: 98.7 F (37.1 C) (11/07 0800) Temp src: Oral (11/07 0800) BP: 150/45 mmHg (11/07 0800) Pulse Rate: 68 (11/07 0800)  Labs:  Recent Labs  05/12/13 0419  05/12/13 2200 05/13/13 0420 05/14/13 0447  HGB 9.4*  --   --  9.4* 9.8*  HCT 29.0*  --   --  29.4* 31.0*  PLT 259  --   --  255 270  LABPROT 22.6*  --   --  20.2* 18.1*  INR 2.06*  --   --  1.78* 1.54*  HEPARINUNFRC  --   < > 0.21* 0.30 0.39  CREATININE  --   --   --   --  6.05*  < > = values in this interval not displayed.  Estimated Creatinine Clearance: 6.5 ml/min (by C-G formula based on Cr of 6.05).   Medications:  Heparin @ 1150 units/hr  Assessment: 84yof continues on heparin for calcified CAD lesions with plans for high-speed rotational atherectomy today. Heparin level is therapeutic. Hgb/Hct are low but stable, platelets ok. No bleeding reported.   Was on coumadin pta for afib (6mg  daily). Admitted with INR 4.57. Coumadin was held and reversed (5mg  IV vitamin k given 10/31). INR down to 1.54. Will need to follow up resuming coumadin after procedure.  Goal of Therapy:  Heparin level 0.3-0.7 units/ml Monitor platelets by anticoagulation protocol: Yes   Plan:  1) Continue heparin at 1150 units/hr 2) Follow up resuming heparin/coumadin after atherectomy  Fredrik Rigger 05/14/2013,8:38 AM

## 2013-05-15 DIAGNOSIS — R4182 Altered mental status, unspecified: Secondary | ICD-10-CM | POA: Diagnosis not present

## 2013-05-15 DIAGNOSIS — I214 Non-ST elevation (NSTEMI) myocardial infarction: Secondary | ICD-10-CM | POA: Diagnosis not present

## 2013-05-15 LAB — BASIC METABOLIC PANEL
BUN: 40 mg/dL — ABNORMAL HIGH (ref 6–23)
CO2: 20 mEq/L (ref 19–32)
Chloride: 96 mEq/L (ref 96–112)
Creatinine, Ser: 6.85 mg/dL — ABNORMAL HIGH (ref 0.50–1.10)
Glucose, Bld: 73 mg/dL (ref 70–99)
Sodium: 130 mEq/L — ABNORMAL LOW (ref 135–145)

## 2013-05-15 LAB — RENAL FUNCTION PANEL
Albumin: 2.6 g/dL — ABNORMAL LOW (ref 3.5–5.2)
CO2: 21 mEq/L (ref 19–32)
Calcium: 8.7 mg/dL (ref 8.4–10.5)
Chloride: 96 mEq/L (ref 96–112)
GFR calc Af Amer: 5 mL/min — ABNORMAL LOW (ref >90)
GFR calc non Af Amer: 5 mL/min — ABNORMAL LOW (ref >90)
Sodium: 132 mEq/L — ABNORMAL LOW (ref 135–145)

## 2013-05-15 LAB — CBC
HCT: 30.1 % — ABNORMAL LOW (ref 36.0–46.0)
Hemoglobin: 9.7 g/dL — ABNORMAL LOW (ref 12.0–15.0)
MCH: 29.2 pg (ref 26.0–34.0)
MCHC: 32.2 g/dL (ref 30.0–36.0)
MCV: 90.7 fL (ref 78.0–100.0)
RBC: 3.32 MIL/uL — ABNORMAL LOW (ref 3.87–5.11)
WBC: 10.5 10*3/uL (ref 4.0–10.5)

## 2013-05-15 LAB — POCT ACTIVATED CLOTTING TIME: Activated Clotting Time: 155 seconds

## 2013-05-15 MED ORDER — RENA-VITE PO TABS
1.0000 | ORAL_TABLET | Freq: Every day | ORAL | Status: DC
  Administered 2013-05-15 – 2013-05-21 (×7): 1 via ORAL
  Filled 2013-05-15 (×8): qty 1

## 2013-05-15 MED ORDER — SORBITOL 70 % SOLN
30.0000 mL | Freq: Four times a day (QID) | Status: AC
  Administered 2013-05-15: 30 mL via ORAL
  Filled 2013-05-15 (×3): qty 30

## 2013-05-15 MED ORDER — DILTIAZEM HCL ER COATED BEADS 120 MG PO CP24
120.0000 mg | ORAL_CAPSULE | Freq: Every day | ORAL | Status: DC
  Administered 2013-05-15: 120 mg via ORAL
  Filled 2013-05-15 (×2): qty 1

## 2013-05-15 MED ORDER — DILTIAZEM HCL ER COATED BEADS 120 MG PO TB24
120.0000 mg | ORAL_TABLET | Freq: Every day | ORAL | Status: DC
  Filled 2013-05-15: qty 1

## 2013-05-15 NOTE — Progress Notes (Signed)
After educating patient and family on the procedure and in the presence of a second Rn with atropine at bedside, right femoral artery sheath was pulled out and pressure held for 20 minutes to achieve hemostasis. Patient tolerated procedure well. Will continue to monitor

## 2013-05-15 NOTE — Progress Notes (Signed)
[facility] Progress Note [place] TEAM 1 - Stepdown/ICU TEAM   [name] ZOX:[id number] DOB: 1927/10/25 DOA: 05/07/2013 PCP: [name], MD  Admit HPI / Brief Narrative: 79 yo female with history of atrial fibrillation on Coumadin, HTN, HLD, ESRD on HD MWF, grade II diastolic dysfunction per last 2 D ECHO 11/2012. Fell at home 10/29 and was treated in th ER for a  supracondylar humerus fracture with mild displacement and angulation of distal fragment. Splint was applied by the EDP and appointment made for pt to FU with Dr. [name]. She returned on 10/30 for inadequate pain control. She returned on  10/31 after she was found to be more confused after she had taken 2 tablets of percocet for pain. She apparently took her pain meds after HD then family noted she was somewhat confused. Per ED physician, pt became more alert compared to mental status on arrival. Of note she had presented to the ER 10/14 for palpitations.  Assessment/Plan:  Acute Encephalopathy- toxic  -Likely due to percocet being used for arm pain + UTI - B12 low and folic acid low normal so will begin to replete (see below) - MS appears to have returned to baseline at this time but is troubled by sundowning   ESRD on HD M/W/F Nephrology consulted to continue usual HD   NSTEMI Cardiology following - cath completed 11/4  - temp pacer, atherectomy, PTCA and DES stent x 2 in RCA - complicated by hypotension requiring Dopamine and Levophed  - LAD atherectomy planned for next week.   Atrial fibrillation w/ RVR on chronic Coumadin Rate controlled, INR therapeutic  Grade II diastolic dysfunction Well compensated at present   Supracondylar humerus fracture with mild displacement and angulation of distal fragment s/p fall 10/30 -inadequate pain control so added low dose Duragesic patch -Appreciate Dr. [name] assistance -XR revealed appropriate alignment of fx's so OK to continue splint and supportive  care -will need to reschedule OP appointment for 1 week after dc  Anemia of chronic renal disease iron deficiency and low normal B 12 and folic acid Follow Hgb trend - no evidence of blood loss at present - give s/c B12 and PO Folic acid - will need to consider OP study to determine if cannot absorb B12 - will likely dc on PO B12 until that can be accomplished-allow renal to replete iron  HTN BP is reasonably controlled at this time  E coli UTI cont Rocephin started 11/2  HLD  Code Status: FULL Family Communication: spoke w/ pt and daughters at bedside  Disposition Plan: SDU   Consultants: Cardiology Nephrology  Orthopedics  Procedures: none  Antibiotics: Cipro 10/31 >> 11/02 Rocephin 11/02 >>   DVT prophylaxis: IV heparin   HPI/Subjective: Pain still controlled, no BM in days, willing to get out of bed today  Objective: Blood pressure 160/49, pulse 76, temperature 98.5 F (36.9 C), temperature source Oral, resp. rate 15, height 5\' 5"  (1.651 m), weight 59 kg (130 lb 1.1 oz), SpO2 98.00%.  Intake/Output Summary (Last 24 hours) at 05/15/13 1533 Last data filed at 05/15/13 1300  Gross per 24 hour  Intake    725 ml  Output   2000 ml  Net  -1275 ml   Exam: General: No acute respiratory distress Lungs: Clear to auscultation bilaterally without wheezes or crackles Cardiovascular: Regular rate without murmur gallop or rub  Abdomen: Nontender, nondistended, soft, bowel sounds positive, no rebound, no ascites, no appreciable mass Extremities: No significant cyanosis, clubbing,  or edema bilateral lower extremities - L UE in splint   Data Reviewed: Basic Metabolic Panel:  Recent Labs Lab 05/10/13 0910 05/10/13 1318 05/11/13 0405 05/14/13 0447 05/15/13 0500 05/15/13 0817  NA 133* 133* 133* 130* 130* 132*  K 4.8 4.4 4.2 4.6 5.8* 5.7*  CL 92* 91* 95* 92* 96 96  CO2 23 22 26 28 20 21   GLUCOSE 75 145* 107* 91 73 80  BUN 67* 69* 32* 34* 40* 41*  CREATININE 8.76*  9.00* 5.20* 6.05* 6.85* 7.22*  CALCIUM 8.0* 8.2* 7.7* 8.8 8.6 8.7  PHOS 8.5* 8.7*  --  5.0*  --  6.9*   Liver Function Tests:  Recent Labs Lab 05/10/13 0910 05/10/13 1318 05/11/13 0405 05/14/13 0447 05/15/13 0817  AST  --   --  16  --   --   ALT  --   --  5  --   --   ALKPHOS  --   --  37*  --   --   BILITOT  --   --  0.3  --   --   PROT  --   --  5.6*  --   --   ALBUMIN 2.7* 2.8* 2.4* 2.6* 2.6*   CBC:  Recent Labs Lab 05/11/13 0405 05/12/13 0419 05/13/13 0420 05/14/13 0447 05/15/13 0500  WBC 7.6 7.6 8.3 9.5 10.5  HGB 8.8* 9.4* 9.4* 9.8* 9.7*  HCT 26.2* 29.0* 29.4* 31.0* 30.1*  MCV 89.7 91.2 92.5 92.3 90.7  PLT 210 259 255 270 295   Cardiac Enzymes:  Recent Labs Lab 05/08/13 1958 05/09/13 0200  TROPONINI 10.88* 7.12*    Recent Results (from the past 240 hour(s))  MRSA PCR SCREENING     Status: None   Collection Time    05/07/13 10:30 PM      Result Value Range Status   MRSA by PCR NEGATIVE  NEGATIVE Final   Comment:            The GeneXpert MRSA Assay (FDA     approved for NASAL specimens     only), is one component of a     comprehensive MRSA colonization     surveillance program. It is not     intended to diagnose MRSA     infection nor to guide or     monitor treatment for     MRSA infections.  URINE CULTURE     Status: None   Collection Time    05/07/13 11:34 PM      Result Value Range Status   Specimen Description URINE, RANDOM   Final   Special Requests NONE   Final   Culture  Setup Time     Final   Value: 05/08/2013 04:05     Performed at [facility] Count     Final   Value: >=100,000 COLONIES/ML     Performed at [facility]   Culture     Final   Value: ESCHERICHIA COLI     Performed at [facility]   Report Status 05/10/2013 FINAL   Final   Organism ID, Bacteria ESCHERICHIA COLI   Final     Studies:  Recent x-ray studies have been reviewed in detail by the Attending Physician  Scheduled  Meds:  Scheduled Meds: . aspirin EC  81 mg Oral Daily  . aspirin  325 mg Oral Daily  . atorvastatin  40 mg Oral Daily  . cefTRIAXone (ROCEPHIN)  IV  1 g Intravenous Q24H  . clopidogrel  75 mg Oral Q breakfast  . [START ON 05/19/2013] darbepoetin (ARANESP) injection - DIALYSIS  60 mcg Intravenous Q Wed-HD  . diltiazem  120 mg Oral Daily  . fentaNYL  12.5 mcg Transdermal Q72H  . folic acid  1 mg Oral Daily  . hydrALAZINE  10 mg Intravenous UD  . metoprolol tartrate  25 mg Oral BID  . multivitamin  1 tablet Oral QHS  . sevelamer carbonate  2,400 mg Oral TID WC  . sorbitol  30 mL Oral Q6H    Time spent on care of this patient: 35 mins   Kaiser Belluomini,MD [phone] 05/15/2013, 3:33 PM

## 2013-05-15 NOTE — Progress Notes (Signed)
[facility] Progress Note  Subjective:   No c/o's.  A little confused.  Objective Filed Vitals:   05/15/13 0900 05/15/13 0930 05/15/13 1000 05/15/13 1030  BP: 146/59 146/57 148/65 145/60  Pulse: 67 68 69 72  Temp:      TempSrc:      Resp:      Height:      Weight:      SpO2:       Physical Exam General: Frail, cooperative, NAD Heart: RRR, 3/6 murmur Lungs: CTA bilat, no wheezes, rales or rhonchi noted Abdomen: Soft, NT, non-distended, nl BS Extremities: L arm with ace wrap, No LE edema, SCDs present Dialysis Access: L AVF patent  Dialysis Orders: MWF [facility]  3hrs 2K/2.5Ca 51.5kg LUA AVF (BH, both needles up, 15ga) Heparin none  EPO 2200 biw Hect 3.5ug tiw   Assessment/Plan: 1. NSTEMI / 2V CAD- PCI 11/7. Cardiac rehab pending 2. AMS - Improving after decr in percocet dose and Seroquel prn vs qhs. 3. A-fib (chronic) with RVR- iv heparin and diltiazem 4. ESRD MWF no hep at center, HD today, Plan again on Mon to stay on sched. 5. HTN/volume - BP stable; on metoprolol BID and prn hydralazine; ? Accuracy of bed wgts. Unable get standing wt today 6. Anemia - Hgb 9.7. On darbe 60/wk, no iv Fe d/t high ferritin (1403) 7. Metabolic bone disease - Ca 8.7; ^ Phos. Continue Renvela 3 with meals 8. Nutrition - on clears. Renal diet when appropriate. Multivitamin, nepro. 9. L elbow fracture - in splint, seen by ortho, plan OP f/u 10. EColi UTI - on rocephin, D#7 of abx, d/c soon? 11. Mobility - PT eval pending  Scot Jun. Broadus John, PA-C [facility] Pager [phone] 05/15/2013,10:50 AM  LOS: 8 days   I have seen and examined patient, discussed with PA and agree with assessment and plan as outlined above. Vinson Moselle MD pager [phone]    cell [phone] 05/15/2013, 11:22 AM    Additional Objective Labs: Basic Metabolic Panel:  Recent Labs Lab 05/10/13 1318  05/14/13 0447 05/15/13 0500 05/15/13 0817  NA 133*  < > 130* 130* 132*  K  4.4  < > 4.6 5.8* 5.7*  CL 91*  < > 92* 96 96  CO2 22  < > 28 20 21   GLUCOSE 145*  < > 91 73 80  BUN 69*  < > 34* 40* 41*  CREATININE 9.00*  < > 6.05* 6.85* 7.22*  CALCIUM 8.2*  < > 8.8 8.6 8.7  PHOS 8.7*  --  5.0*  --  6.9*  < > = values in this interval not displayed. Liver Function Tests:  Recent Labs Lab 05/11/13 0405 05/14/13 0447 05/15/13 0817  AST 16  --   --   ALT 5  --   --   ALKPHOS 37*  --   --   BILITOT 0.3  --   --   PROT 5.6*  --   --   ALBUMIN 2.4* 2.6* 2.6*   CBC:  Recent Labs Lab 05/11/13 0405 05/12/13 0419 05/13/13 0420 05/14/13 0447 05/15/13 0500  WBC 7.6 7.6 8.3 9.5 10.5  HGB 8.8* 9.4* 9.4* 9.8* 9.7*  HCT 26.2* 29.0* 29.4* 31.0* 30.1*  MCV 89.7 91.2 92.5 92.3 90.7  PLT 210 259 255 270 295   Blood Culture    Component Value Date/Time   SDES URINE, RANDOM 05/07/2013 2334   SPECREQUEST NONE 05/07/2013 2334   CULT  Value: ESCHERICHIA COLI  Performed at [facility] 05/07/2013 2334   REPTSTATUS 05/10/2013 FINAL 05/07/2013 2334    Cardiac Enzymes:  Recent Labs Lab 05/08/13 1420 05/08/13 1958 05/09/13 0200  TROPONINI 11.55* 10.88* 7.12*   Studies/Results: No results found. Medications: . sodium chloride 5 mL/hr at 05/11/13 1900  . sodium chloride 50 mL/hr at 05/14/13 1645  . diltiazem (CARDIZEM) infusion 5 mg/hr (05/15/13 0724)   . aspirin EC  81 mg Oral Daily  . aspirin  325 mg Oral Daily  . atorvastatin  40 mg Oral Daily  . cefTRIAXone (ROCEPHIN)  IV  1 g Intravenous Q24H  . clopidogrel  75 mg Oral Q breakfast  . [START ON 05/19/2013] darbepoetin (ARANESP) injection - DIALYSIS  60 mcg Intravenous Q Wed-HD  . fentaNYL  12.5 mcg Transdermal Q72H  . folic acid  1 mg Oral Daily  . hydrALAZINE  10 mg Intravenous UD  . metoprolol tartrate  25 mg Oral BID  . sevelamer carbonate  2,400 mg Oral TID WC

## 2013-05-15 NOTE — CV Procedure (Signed)
[name] is a 79 y.o. female    [id number]  [id number] LOCATION:  FACILITY: [facility]  PHYSICIAN: Lennette Bihari, MD, [facility] [birth year]   DATE OF PROCEDURE:  05/15/2013      Complex PCI with Temporary Pacemaker, High Speed Rotational Atherectomy, PTCA, Cutting Balloon, DES stenting x 2    HISTORY:  [name] is an 79 year old African American female with history of end-stage renal disease on hemodialysis I pretension, hyperlipidemia, paroxysmal atrial fibrillation, as well as his moderate aortic stenosis. She recently developed a non-ST segment elevation myocardial infarction. She also is status post recent left arm fracture. She had undergone cardiac catheterization on 05/11/2013 by Dr. Allyson Sabal. Please refer to his catheterization findings. Is severely calcified coronary arteries no ostial stenosis of her right ring artery with 95% mid distal RCA stenosis, as well as severe we calcified circumflex system with complete occlusion of the mid AV groove circumflex but with high-grade eccentric calcified stenosis in a large marginal branch arising prior to the occlusion. She now is brought to the laboratory to attempt complex high-speed rotational atherectomy of her severely calcified right carotid artery with initial plans for staging of her circumflex vessel.   PROCEDURE:  The patient was brought to the second floor [place] [facility]iac cath lab in the postabsorptive state. She was premedicated initially with Versed 1 mg and fentanyl 25 mcg. With the plan for  rotational atherectomy  to the right coronary artery,  the left femoral vein was punctured and a 7 French sheath was inserted. A temporary transvenous pacemaker was then advanced to the RV apex with documentation of excellent capture. A right femoral artery was then punctured anteriorly and a 6 French sheath was inserted. With the subtotal near ostial RCA stenosis a 6 French FR4 guide with sideholes was inserted and advanced to the RCA.  Scout angiography confirmed 99.9% stenosis with TIMI 1 flow such that the flow did not reach the previously noted 95% stenosis in the region proximal to the the acute margin.  Angiomax bolus plus infusion at renal doses was then administered and ACT was documented to be therapeutic. On there was significant difficulty in initially crossing the lesion the lesion was crossed proximally with a Roto extra support wire. A 1.25 burr was then inserted. In order to rotoblate the ostial site, the guiding catheter had to be removed from the ostium and this created some difficulty but ultimately once it was felt that the Roto burr was coaxial to the ostium several runs were made. Athough it was difficult to cross the distal stenosis the wire was able to be advanced distally enough to allow for rotational atherectomy of the mid RCA site. After each run, the burr had to be  pulled back into the guide and there was difficulty in establishing coaxialability but ultimately successful runs were made proximally to allow for calcific debulking and creation of a very small luminal channel. Then A 1.5 mm burr was inserted. Several additional runs were made both ostially and extending distally to the acute margin. The 1.5 mm burr was then successfully removed with dynagluide technique. A 2.0x15 mm Emerge balloon was then inserted and dilatation at both sites was performed. A 2.5x10 mm Flextome cutting balloon was then inserted and although there was some difficulty in crossing the ostial lesion this ultimately was successful and several dilatations were made at this site. However, the cutting balloon was never able to reach the mid lesion do to the residual calcification throughout remaining  RCA. A 3.0x20 mm Emerge balloon was then used at all sites that had undergone rotational atherectomy from ostium to the acute margin underwent PTCA at low levels. A 3.0x16 mm Promus Premier DES stent was able to be advanced with some difficulty but  successfully to the mid lesion and dilated x2 up to 13 atmospheres. A second 3.0 x16 mm Promus Premier DES stent was then inserted at the ostium cover the previous subtotal stenosis. Post stent dilatation was done at both sites with a 3.25x12 mm noncompliant Quantum. Plavix 300 mg was administered during the procedure.  At completion of the procedure, scout angiography confirmed excellent angiographic result and from ostium to the acute margin the stenoses was reduced to approximately 0%. There was brisk TIMI-3 flow. The distal RCA supply a different small branches. Of note, during the initial rotational atherectomy runs, the patient did become hypotensive and did require initiation of dopamine due to persistent hypotension as well as Levophed. When she was hypotensive her pacemaker rate was increased to 90 transiently. Ultimately, she left the catheterization laboratory with stable hemodynamics, chest pain free. Her Cardizem dose had been reduced from 10 to 5 mg per hour. She remained in sinus rhythm.    HEMODYNAMICS:    Initial central pressure was approximately 120 - 140/70. During the procedure, patient did drop her blood pressure to 75 mm systolically. Dopamine was instituted. Her blood pressure continued to be low despite dopamine titration, and Levothroid was started. She left the catheterization laboratory with stable hemodynamics chest pain-free     ANGIOGRAPHY:  At the start of the procedure, initial scout angiography of the right coronary artery revealed a 99.9% severely calcified somewhat tortuous stenosis. The RCA was severely calcified. Initially there was TIMI 1 flow and the flow did not reach the second lesion which was previously noted to be proximal to the acute margin. Following this a prolonged, difficult but successful intervention to include high-speed rotational atherectomy, diffuse PTCA, flexed on Cutting Balloon atherotomy, and ultimate stenting with Promus Premier 3.0x16 mm  drug-eluting stents inserted at the ostium and also at the mid lesion both stenoses were reduced to 0%. The right coronary artery in between the stents had also undergone rotational atherectomy and consequently underwent PTCA at low level with a 3.0 mm balloon. At the completion of the procedure, there was brisk TIMI-3 flow in the right cardiography with significant calcification debulking and both high-grade stenoses were reduced to 0%. The distal RCA gave rise to four small  PDA, inferior LV, and posterolateral branches.   IMPRESSION:  Difficult but successful complex percutaneous coronary intervention requiring temporary pacemaker insertion, high-speed rotational atherectomy, PTCA, Cutting Balloon atherotomy, and ultimate stenting x2 with Promus Premier 3.0x16 mm drug-eluting stents with an initial 99.9% subtotal ostial stenosis reduced to 0% and the mid RCA stenosis reduced from 95%  to 0%. The patient developed transient hypotension to blood pressure were proximally 70-75 mm requiring dopamine and levophed during the procedure. Anticoagulation was done with Angiomax as well as 300 mg of oral Plavix.   Plan for staged high-speed rotational atherectomy procedure of the very calcified left circumflex coronary artery later next week.   Lennette Bihari, MD, [facility] 05/15/2013 12:14 PM

## 2013-05-15 NOTE — Progress Notes (Signed)
Pt just now returning from HD. Noted needs another PCI. Will hold ambulation today (Rn will get her OOB later today). Please specify ambulation before PCI. Ethelda Chick CES, ACSM 11:31 AM 05/15/2013

## 2013-05-15 NOTE — Procedures (Deleted)
I was present at this dialysis session, have reviewed the session itself and made  appropriate changes   Vinson Moselle MD  pager [phone]    cell [phone]  05/15/2013, 11:22 AM

## 2013-05-15 NOTE — Procedures (Signed)
I was present at this dialysis session, have reviewed the session itself and made  appropriate changes   Rob Chin Wachter MD  pager [phone]    cell [phone]  05/15/2013, 11:13 AM   

## 2013-05-15 NOTE — Progress Notes (Signed)
Subjective:  No chest pain; just back from dialysis  Objective:   Vital Signs in the last 24 hours: Temp:  [97.6 F (36.4 C)-98.4 F (36.9 C)] 98.3 F (36.8 C) (11/08 1100) Pulse Rate:  [64-72] 71 (11/08 1100) Resp:  [0-19] 16 (11/08 1100) BP: (130-168)/(47-65) 149/65 mmHg (11/08 1100) SpO2:  [97 %-100 %] 98 % (11/08 1100) Arterial Line BP: (133-165)/(43-54) 143/47 mmHg (11/08 0000) Weight:  [130 lb 1.1 oz (59 kg)-131 lb 6.3 oz (59.6 kg)] 130 lb 1.1 oz (59 kg) (11/08 0739)  Intake/Output from previous day: 11/07 0701 - 11/08 0700 In: 745.5 [I.V.:745.5] Out: 0   Scheduled: . aspirin EC  81 mg Oral Daily  . aspirin  325 mg Oral Daily  . atorvastatin  40 mg Oral Daily  . cefTRIAXone (ROCEPHIN)  IV  1 g Intravenous Q24H  . clopidogrel  75 mg Oral Q breakfast  . [START ON 05/19/2013] darbepoetin (ARANESP) injection - DIALYSIS  60 mcg Intravenous Q Wed-HD  . fentaNYL  12.5 mcg Transdermal Q72H  . folic acid  1 mg Oral Daily  . hydrALAZINE  10 mg Intravenous UD  . metoprolol tartrate  25 mg Oral BID  . multivitamin  1 tablet Oral QHS  . sevelamer carbonate  2,400 mg Oral TID WC   . sodium chloride 5 mL/hr at 05/11/13 1900  . sodium chloride 50 mL/hr at 05/14/13 1645  . diltiazem (CARDIZEM) infusion 5 mg/hr (05/15/13 0724)     Physical Exam:   General appearance: alert, cooperative and no distress Neck: no JVD, supple, symmetrical, trachea midline and thyroid not enlarged, symmetric, no tenderness/mass/nodules Lungs: clear to auscultation bilaterally Heart: regular rate and rhythm and 2/6 AS murmur Abdomen: soft, non-tender; bowel sounds normal; no masses,  no organomegaly Extremities: L arm in cast Skin: Skin color, texture, turgor normal. No rashes or lesions Neurologic: Grossly normal   Rate: 75  Rhythm: normal sinus rhythm  Lab Results:    Recent Labs  05/15/13 0500 05/15/13 0817  NA 130* 132*  K 5.8* 5.7*  CL 96 96  CO2 20 21  GLUCOSE 73 80  BUN 40*  41*  CREATININE 6.85* 7.22*   No results found for this basename: TROPONINI, CK, MB,  in the last 72 hours Hepatic Function Panel  Recent Labs  05/15/13 0817  ALBUMIN 2.6*    Recent Labs  05/15/13 0500  INR 2.23*   BNP (last 3 results) No results found for this basename: PROBNP,  in the last 8760 hours  Lipid Panel     Component Value Date/Time   CHOL 220* 11/10/2012 1100   TRIG 122 11/10/2012 1100   HDL 95 11/10/2012 1100   CHOLHDL 2.3 11/10/2012 1100   VLDL 24 11/10/2012 1100   LDLCALC 101* 11/10/2012 1100      Imaging:  No results found.    Assessment/Plan:   Principal Problem:   Encephalopathy, toxic Active Problems:   Anemia   HTN (hypertension)   ESRD (end stage renal disease) on dialysis   Long term (current) use of anticoagulants   Atrial fibrillation with RVR   NSTEMI (non-ST elevated myocardial infarction)   UTI (urinary tract infection)   Day 1 s/p very difficult but successful PCI to severely calcified RCA with initial [place] 1 flow with temporary pacemaker, HSRA, PTCA diffusely, cutting balloon, and stenting at 2 sites in RCA. No recurrent chest pain. Maintaining NSR.  Will wean and DC IV Cardizem and start oral cardizem. On ASA/Plavix, BB.  Will tentatively plan for HSRA of LCx on Wednesday. Long discussion with patient and family.   Lennette Bihari, MD, [facility] 05/15/2013, 11:50 AM

## 2013-05-16 LAB — CBC
Hemoglobin: 10 g/dL — ABNORMAL LOW (ref 12.0–15.0)
MCHC: 32.5 g/dL (ref 30.0–36.0)
Platelets: 282 10*3/uL (ref 150–400)
RBC: 3.36 MIL/uL — ABNORMAL LOW (ref 3.87–5.11)
WBC: 8.9 10*3/uL (ref 4.0–10.5)

## 2013-05-16 LAB — PROTIME-INR
INR: 1.8 — ABNORMAL HIGH (ref 0.00–1.49)
Prothrombin Time: 20.4 seconds — ABNORMAL HIGH (ref 11.6–15.2)

## 2013-05-16 MED ORDER — DILTIAZEM HCL ER COATED BEADS 180 MG PO CP24
180.0000 mg | ORAL_CAPSULE | Freq: Every day | ORAL | Status: DC
  Administered 2013-05-16 – 2013-05-17 (×2): 180 mg via ORAL
  Filled 2013-05-16 (×6): qty 1

## 2013-05-16 MED ORDER — SORBITOL 70 % SOLN
30.0000 mL | Freq: Every day | Status: DC | PRN
  Administered 2013-05-21: 30 mL via ORAL
  Filled 2013-05-16 (×2): qty 30

## 2013-05-16 NOTE — Evaluation (Signed)
Physical Therapy Evaluation Patient Details Name: [name] MRN: [id number] DOB: 1927/07/27 Today's Date: 05/16/2013 Time: 8469-6295 PT Time Calculation (min): 19 min  PT Assessment / Plan / Recommendation History of Present Illness  79 yo female with history of atrial fibrillation on Coumadin, HTN, HLD, ESRD on HD MWF, grade II diastolic dysfunction.  Patient with recent fall at home with Lt humerus fx with splint/sling.  Patient now admitted on 10/31 with confusion, A-fib and NSTEMI.  Patient s/p stents on 11/7.  Plan additional stenting on Wed 11/12.  Clinical Impression  Patient presents with problems listed below.  Will benefit from acute PT to maximize independence prior to discharge home.  LUE in splint/sling impacts balance and function.  Recommend HHPT at discharge for continued therapy.  Also recommend OT consult to address ADL's with LUE in sling.    PT Assessment  Patient needs continued PT services    Follow Up Recommendations  Home health PT;Supervision/Assistance - 24 hour    Does the patient have the potential to tolerate intense rehabilitation      Barriers to Discharge        Equipment Recommendations  None recommended by PT    Recommendations for Other Services OT consult   Frequency Min 3X/week    Precautions / Restrictions Precautions Precautions: Fall Precaution Comments: LUE in splint/sling Required Braces or Orthoses: Sling (LUE) Restrictions Weight Bearing Restrictions: No (No orders written for LUE) Other Position/Activity Restrictions: LUE functionally NWB with splint/sling   Pertinent Vitals/Pain Pain in LUE impacting mobility      Mobility  Bed Mobility Bed Mobility: Not assessed Transfers Transfers: Sit to Stand;Stand to Sit Sit to Stand: 1: +2 Total assist;With upper extremity assist;With armrests;From chair/3-in-1 Sit to Stand: Patient Percentage: 70% Stand to Sit: 1: +2 Total assist;With upper extremity assist;With armrests;To  chair/3-in-1 Stand to Sit: Patient Percentage: 70% Details for Transfer Assistance: Adjusted sling on LUE. Verbal cues for use of RUE on armrest.  Assist to come to standing and for balance initially. Ambulation/Gait Ambulation/Gait Assistance: 1: +2 Total assist Ambulation/Gait: Patient Percentage: 80% Ambulation Distance (Feet): 20 Feet Assistive device: 2 person hand held assist Ambulation/Gait Assistance Details: Hand-hold assist with RUE, and assist with gait belt on Lt side.  Cues to stand upright.  Patient taking short shuffling steps. Gait Pattern: Step-through pattern;Decreased step length - right;Decreased step length - left;Shuffle;Decreased trunk rotation;Trunk flexed Gait velocity: Slow gait speed General Gait Details: HR increased from 82 to 87 with ambulation.  Dyspnea 2/4.      Exercises     PT Diagnosis: Difficulty walking;Abnormality of gait;Generalized weakness;Acute pain  PT Problem List: Decreased strength;Decreased activity tolerance;Decreased balance;Decreased mobility;Decreased knowledge of use of DME;Cardiopulmonary status limiting activity;Pain PT Treatment Interventions: DME instruction;Gait training;Functional mobility training;Patient/family education     PT Goals(Current goals can be found in the care plan section) Acute Rehab PT Goals Patient Stated Goal: To get stronger PT Goal Formulation: With patient/family Time For Goal Achievement: 05/23/13 Potential to Achieve Goals: Good  Visit Information  Last PT Received On: 05/16/13 Assistance Needed: +2 (+1 for lines) History of Present Illness: 79 yo female with history of atrial fibrillation on Coumadin, HTN, HLD, ESRD on HD MWF, grade II diastolic dysfunction.  Patient with recent fall at home with Lt humerus fx with splint/sling.  Patient now admitted on 10/31 with confusion, A-fib and NSTEMI.  Patient s/p stents on 11/7.  Plan additional stenting on Wed 11/12.       Prior Functioning  Home  Living Family/patient expects to be discharged to:: Private residence Living Arrangements: Spouse/significant other;Children (Has 9 children living close by) Available Help at Discharge: Family;Available 24 hours/day Type of Home: House Home Access: Ramped entrance Home Layout: One level Home Equipment: Walker - 2 wheels;Cane - single point;Wheelchair - [employer];Shower seat Additional Comments: Husband had recent hospitalization and is getting f/u care at home Prior Function Level of Independence: Independent Communication Communication: No difficulties    Cognition  Cognition Arousal/Alertness: Awake/alert Behavior During Therapy: WFL for tasks assessed/performed Overall Cognitive Status: Within Functional Limits for tasks assessed    Extremity/Trunk Assessment Upper Extremity Assessment Upper Extremity Assessment: LUE deficits/detail LUE Deficits / Details: Decreased strength and ROM - cast/sling LUE: Unable to fully assess due to pain;Unable to fully assess due to immobilization LUE Coordination: decreased gross motor Lower Extremity Assessment Lower Extremity Assessment: Generalized weakness Cervical / Trunk Assessment Cervical / Trunk Assessment: Normal   Balance    End of Session PT - End of Session Equipment Utilized During Treatment: Gait belt Activity Tolerance: Patient limited by pain;Patient limited by fatigue Patient left: in chair;with call bell/phone within reach;with family/visitor present Nurse Communication: Mobility status  GP     Vena Austria 05/16/2013, 10:39 AM Durenda Hurt. Renaldo Fiddler, [facility] Acute Rehab Services Pager [phone]

## 2013-05-16 NOTE — Progress Notes (Signed)
[facility] Progress Note [name] TEAM 1 - Stepdown/ICU TEAM   [name] RUE:[id number] DOB: [birth year] DOA: 05/07/2013 PCP: [name], MD  Admit HPI / Brief Narrative: 79 yo female with history of atrial fibrillation on Coumadin, HTN, HLD, ESRD on HD MWF, grade II diastolic dysfunction per last 2 D ECHO 11/2012. Fell at home 10/29 and was treated in th ER for a  supracondylar humerus fracture with mild displacement and angulation of distal fragment. Splint was applied by the EDP and appointment made for pt to FU with Dr. [name]. She returned on 10/30 for inadequate pain control. She returned on  10/31 after she was found to be more confused after she had taken 2 tablets of percocet for pain. She apparently took her pain meds after HD then family noted she was somewhat confused. Per ED physician, pt became more alert compared to mental status on arrival. Of note she had presented to the ER 10/14 for palpitations.  Assessment/Plan:  Acute Encephalopathy- toxic  -Likely due to percocet being used for arm pain + UTI - B12 low and folic acid low normal so will begin to replete (see below) - MS appears to have returned to baseline at this time but is troubled by sundowning   ESRD on HD M/W/F Nephrology consulted to continue usual HD   NSTEMI Cardiology following - cath completed 11/4  - temp pacer, atherectomy, PTCA and DES stent x 2 in RCA - complicated by hypotension requiring Dopamine and Levophed  - LAD atherectomy planned for next week.   Atrial fibrillation w/ RVR on chronic Coumadin Rate controlled, INR varying   Grade II diastolic dysfunction Well compensated at present   Supracondylar humerus fracture with mild displacement and angulation of distal fragment s/p fall 10/30 -inadequate pain control so added low dose Duragesic patch -Appreciate Dr. [name] assistance -XR revealed appropriate alignment of fx's so OK to continue splint and supportive care -will  need to reschedule OP appointment for 1 week after dc  Anemia of chronic renal disease iron deficiency and low normal B 12 and folic acid Follow Hgb trend - no evidence of blood loss at present - give s/c B12 and PO Folic acid - will need to consider OP study to determine if cannot absorb B12 - will likely dc on PO B12 until that can be accomplished-allow renal to replete iron  HTN BP is reasonably controlled at this time  E coli UTI cont Rocephin started 11/2  HLD  Code Status: FULL Family Communication: spoke w/ pt and daughters at bedside  Disposition Plan: SDU   Consultants: Cardiology Nephrology  Orthopedics  Procedures: none  Antibiotics: Cipro 10/31 >> 11/02 Rocephin 11/02 >>   DVT prophylaxis: IV heparin   HPI/Subjective: Pain still controlled- still no BM- will re-dose Sorbitol  Objective: Blood pressure 134/72, pulse 87, temperature 97.6 F (36.4 C), temperature source Oral, resp. rate 18, height 5\' 5"  (1.651 m), weight 59 kg (130 lb 1.1 oz), SpO2 95.00%.  Intake/Output Summary (Last 24 hours) at 05/16/13 1353 Last data filed at 05/16/13 1101  Gross per 24 hour  Intake    530 ml  Output      1 ml  Net    529 ml   Exam: General: No acute respiratory distress Lungs: Clear to auscultation bilaterally without wheezes or crackles Cardiovascular: Regular rate without murmur gallop or rub  Abdomen: Nontender, nondistended, soft, bowel sounds positive, no rebound, no ascites, no appreciable mass Extremities: No significant cyanosis,  clubbing, or edema bilateral lower extremities - L UE in splint   Data Reviewed: Basic Metabolic Panel:  Recent Labs Lab 05/10/13 0910 05/10/13 1318 05/11/13 0405 05/14/13 0447 05/15/13 0500 05/15/13 0817  NA 133* 133* 133* 130* 130* 132*  K 4.8 4.4 4.2 4.6 5.8* 5.7*  CL 92* 91* 95* 92* 96 96  CO2 23 22 26 28 20 21   GLUCOSE 75 145* 107* 91 73 80  BUN 67* 69* 32* 34* 40* 41*  CREATININE 8.76* 9.00* 5.20* 6.05* 6.85*  7.22*  CALCIUM 8.0* 8.2* 7.7* 8.8 8.6 8.7  PHOS 8.5* 8.7*  --  5.0*  --  6.9*   Liver Function Tests:  Recent Labs Lab 05/10/13 0910 05/10/13 1318 05/11/13 0405 05/14/13 0447 05/15/13 0817  AST  --   --  16  --   --   ALT  --   --  5  --   --   ALKPHOS  --   --  37*  --   --   BILITOT  --   --  0.3  --   --   PROT  --   --  5.6*  --   --   ALBUMIN 2.7* 2.8* 2.4* 2.6* 2.6*   CBC:  Recent Labs Lab 05/12/13 0419 05/13/13 0420 05/14/13 0447 05/15/13 0500 05/16/13 0445  WBC 7.6 8.3 9.5 10.5 8.9  HGB 9.4* 9.4* 9.8* 9.7* 10.0*  HCT 29.0* 29.4* 31.0* 30.1* 30.8*  MCV 91.2 92.5 92.3 90.7 91.7  PLT 259 255 270 295 282   Cardiac Enzymes: No results found for this basename: CKTOTAL, CKMB, CKMBINDEX, TROPONINI,  in the last 168 hours  Recent Results (from the past 240 hour(s))  MRSA PCR SCREENING     Status: None   Collection Time    05/07/13 10:30 PM      Result Value Range Status   MRSA by PCR NEGATIVE  NEGATIVE Final   Comment:            The GeneXpert MRSA Assay (FDA     approved for NASAL specimens     only), is one component of a     comprehensive MRSA colonization     surveillance program. It is not     intended to diagnose MRSA     infection nor to guide or     monitor treatment for     MRSA infections.  URINE CULTURE     Status: None   Collection Time    05/07/13 11:34 PM      Result Value Range Status   Specimen Description URINE, RANDOM   Final   Special Requests NONE   Final   Culture  Setup Time     Final   Value: 05/08/2013 04:05     Performed at [facility] Count     Final   Value: >=100,000 COLONIES/ML     Performed at [facility]   Culture     Final   Value: ESCHERICHIA COLI     Performed at [facility]   Report Status 05/10/2013 FINAL   Final   Organism ID, Bacteria ESCHERICHIA COLI   Final     Studies:  Recent x-ray studies have been reviewed in detail by the Attending Physician  Scheduled  Meds:  Scheduled Meds: . aspirin EC  81 mg Oral Daily  . atorvastatin  40 mg Oral Daily  . cefTRIAXone (ROCEPHIN)  IV  1 g Intravenous Q24H  .  clopidogrel  75 mg Oral Q breakfast  . [START ON 05/19/2013] darbepoetin (ARANESP) injection - DIALYSIS  60 mcg Intravenous Q Wed-HD  . diltiazem  180 mg Oral Daily  . fentaNYL  12.5 mcg Transdermal Q72H  . folic acid  1 mg Oral Daily  . hydrALAZINE  10 mg Intravenous UD  . metoprolol tartrate  25 mg Oral BID  . multivitamin  1 tablet Oral QHS  . sevelamer carbonate  2,400 mg Oral TID WC    Time spent on care of this patient: 35 mins   Jamaree Hosier,MD [phone] 05/16/2013, 1:53 PM

## 2013-05-16 NOTE — Progress Notes (Signed)
[facility] Progress Note  Subjective:   Fully alert today  Objective Filed Vitals:   05/15/13 2047 05/15/13 2300 05/16/13 0014 05/16/13 0402  BP:  139/60    Pulse:      Temp: 98.7 F (37.1 C)  98.3 F (36.8 C) 97.4 F (36.3 C)  TempSrc: Oral  Oral Oral  Resp:      Height:      Weight:      SpO2: 94%  96% 95%   Physical Exam General: Frail, cooperative, NAD Heart: RRR, 3/6 murmur Lungs: CTA bilat, no wheezes, rales or rhonchi noted Abdomen: Soft, NT, non-distended, nl BS Extremities: L arm with ace wrap, No LE edema, SCDs present Dialysis Access: L AVF patent  Dialysis Orders: MWF [facility]  3hrs 2K/2.5Ca 51.5kg LUA AVF (BH, both needles up, 15ga) Heparin none  EPO 2200 twice a week  Hect 3.5ug   Assessment/Plan: 1. NSTEMI / 2V CAD- had RCA rotablator procedure with stenting and is going to have similar procedure to LCx later this week on Wed. Will dialyze on TTS schedule until procedures are done 2. AMS - Improving after decr in percocet dose and Seroquel prn vs qhs. 3. CAF- back in nsr now, on po dilt, BB, asa/plavix 4. ESRD usual MWF, now on TTS as above 5. HTN/volume - BP stable; on metoprolol BID and prn hydralazine; ? Accuracy of bed wgts. Unable get standing wt today 6. Anemia - Hgb 9.7, on darbe 60/wk, no iv Fe d/t high ferritin (1403) 7. MBD- ^phos, ca ok, cont binders, vit D 8. L elbow fracture - in splint, seen by ortho, plan OP f/u 9. EColi UTI - day #10 of IV abx (cipro>rocephin), should be fully treated 10. Mobility - PT eval pending   Vinson Moselle MD pager [phone]    cell [phone] 05/16/2013, 8:57 AM    Additional Objective Labs: Basic Metabolic Panel:  Recent Labs Lab 05/10/13 1318  05/14/13 0447 05/15/13 0500 05/15/13 0817  NA 133*  < > 130* 130* 132*  K 4.4  < > 4.6 5.8* 5.7*  CL 91*  < > 92* 96 96  CO2 22  < > 28 20 21   GLUCOSE 145*  < > 91 73 80  BUN 69*  < > 34* 40* 41*  CREATININE 9.00*  < > 6.05*  6.85* 7.22*  CALCIUM 8.2*  < > 8.8 8.6 8.7  PHOS 8.7*  --  5.0*  --  6.9*  < > = values in this interval not displayed. Liver Function Tests:  Recent Labs Lab 05/11/13 0405 05/14/13 0447 05/15/13 0817  AST 16  --   --   ALT 5  --   --   ALKPHOS 37*  --   --   BILITOT 0.3  --   --   PROT 5.6*  --   --   ALBUMIN 2.4* 2.6* 2.6*   CBC:  Recent Labs Lab 05/12/13 0419 05/13/13 0420 05/14/13 0447 05/15/13 0500 05/16/13 0445  WBC 7.6 8.3 9.5 10.5 8.9  HGB 9.4* 9.4* 9.8* 9.7* 10.0*  HCT 29.0* 29.4* 31.0* 30.1* 30.8*  MCV 91.2 92.5 92.3 90.7 91.7  PLT 259 255 270 295 282   Blood Culture    Component Value Date/Time   SDES URINE, RANDOM 05/07/2013 2334   SPECREQUEST NONE 05/07/2013 2334   CULT  Value: ESCHERICHIA COLI Performed at [facility] 05/07/2013 2334   REPTSTATUS 05/10/2013 FINAL 05/07/2013 2334    Cardiac Enzymes: No results  found for this basename: CKTOTAL, CKMB, CKMBINDEX, TROPONINI,  in the last 168 hours Studies/Results: No results found. Medications: . sodium chloride 10 mL/hr at 05/15/13 2308  . sodium chloride 50 mL/hr at 05/14/13 1645   . aspirin EC  81 mg Oral Daily  . aspirin  325 mg Oral Daily  . atorvastatin  40 mg Oral Daily  . cefTRIAXone (ROCEPHIN)  IV  1 g Intravenous Q24H  . clopidogrel  75 mg Oral Q breakfast  . [START ON 05/19/2013] darbepoetin (ARANESP) injection - DIALYSIS  60 mcg Intravenous Q Wed-HD  . diltiazem  120 mg Oral Daily  . fentaNYL  12.5 mcg Transdermal Q72H  . folic acid  1 mg Oral Daily  . hydrALAZINE  10 mg Intravenous UD  . metoprolol tartrate  25 mg Oral BID  . multivitamin  1 tablet Oral QHS  . sevelamer carbonate  2,400 mg Oral TID WC

## 2013-05-16 NOTE — Progress Notes (Signed)
05/15/2013 7 p - 7a shift. Pt.A/Ox4 and is on room air. Her left arm is in a sling due to fracture at home. She had c/o arm pain during the shift 3/10 and prn tylenol was given. Pt.says her left arm pain becomes worse with movement. She has a hemodialysis fistula to the left upper arm and with no complications noted. She also has a dressing to her right groin area from previous cardiac cath and its a level 0. She has sleep throughout the night without any signs of distress.

## 2013-05-16 NOTE — Progress Notes (Addendum)
   Subjective:   No chest pain; feels better and stronger.  Objective:   Vital Signs in the last 24 hours: Temp:  [97.4 F (36.3 C)-98.7 F (37.1 C)] 97.4 F (36.3 C) (11/09 0402) Pulse Rate:  [67-76] 76 (11/08 1626) Resp:  [15-18] 18 (11/08 1626) BP: (139-160)/(49-65) 139/60 mmHg (11/08 2300) SpO2:  [94 %-98 %] 95 % (11/09 0402)  Intake/Output from previous day: 11/08 0701 - 11/09 0700 In: 1075 [P.O.:720; I.V.:305; IV Piggyback:50] Out: 2000   Medications: . aspirin EC  81 mg Oral Daily  . aspirin  325 mg Oral Daily  . atorvastatin  40 mg Oral Daily  . cefTRIAXone (ROCEPHIN)  IV  1 g Intravenous Q24H  . clopidogrel  75 mg Oral Q breakfast  . [START ON 05/19/2013] darbepoetin (ARANESP) injection - DIALYSIS  60 mcg Intravenous Q Wed-HD  . diltiazem  120 mg Oral Daily  . fentaNYL  12.5 mcg Transdermal Q72H  . folic acid  1 mg Oral Daily  . hydrALAZINE  10 mg Intravenous UD  . metoprolol tartrate  25 mg Oral BID  . multivitamin  1 tablet Oral QHS  . sevelamer carbonate  2,400 mg Oral TID WC    . sodium chloride 10 mL/hr at 05/15/13 2308  . sodium chloride 50 mL/hr at 05/14/13 1645    Physical Exam:   General appearance: alert, cooperative and no distress Neck: no JVD and supple, symmetrical, trachea midline Lungs: clear to auscultation bilaterally Heart: regular rate and rhythm and 2/6 AS murmur Abdomen: soft, non-tender; bowel sounds normal; no masses,  no organomegaly Extremities: no edema, redness or tenderness in the calves or thighs and L arnm in soft cast   Rate: 69  Rhythm: normal sinus rhythm  Lab Results:    Recent Labs  05/15/13 0500 05/15/13 0817  NA 130* 132*  K 5.8* 5.7*  CL 96 96  CO2 20 21  GLUCOSE 73 80  BUN 40* 41*  CREATININE 6.85* 7.22*   No results found for this basename: TROPONINI, CK, MB,  in the last 72 hours Hepatic Function Panel  Recent Labs  05/15/13 0817  ALBUMIN 2.6*    Recent Labs  05/16/13 0445  INR 1.80*    BNP (last 3 results) No results found for this basename: PROBNP,  in the last 8760 hours  Lipid Panel     Component Value Date/Time   CHOL 220* 11/10/2012 1100   TRIG 122 11/10/2012 1100   HDL 95 11/10/2012 1100   CHOLHDL 2.3 11/10/2012 1100   VLDL 24 11/10/2012 1100   LDLCALC 101* 11/10/2012 1100    ECG today : NSR at 68  Imaging:  No results found.    Assessment/Plan:   Principal Problem:   Encephalopathy, toxic Active Problems:   Anemia   HTN (hypertension)   ESRD (end stage renal disease) on dialysis   Long term (current) use of anticoagulants   Atrial fibrillation with RVR   NSTEMI (non-ST elevated myocardial infarction)   UTI (urinary tract infection)   Day 2 s/p complex PCI/HSRAcutting balloon/ stenting to RCA. Plan HSRA of eccentric calcified OM stenosis on Wednesday. Spoke with Dr. Arlean Hopping of renal  And plan for dialysis on Tuesday and Thursday this week. No further AF on oral cardizem.BP 155/65, will titrate to 180 mg daily. On ASA 81 mg/ Plavix. Coumadin on hold.   Lennette Bihari, MD, [facility] 05/16/2013, 8:50 AM

## 2013-05-17 ENCOUNTER — Encounter: Payer: Self-pay | Admitting: Cardiovascular Disease

## 2013-05-17 ENCOUNTER — Encounter: Payer: Self-pay | Admitting: *Deleted

## 2013-05-17 LAB — CBC
Hemoglobin: 9.9 g/dL — ABNORMAL LOW (ref 12.0–15.0)
MCH: 29.1 pg (ref 26.0–34.0)
MCV: 92.1 fL (ref 78.0–100.0)
Platelets: 293 10*3/uL (ref 150–400)
RBC: 3.4 MIL/uL — ABNORMAL LOW (ref 3.87–5.11)
RDW: 14.2 % (ref 11.5–15.5)

## 2013-05-17 LAB — PROTIME-INR: Prothrombin Time: 22 seconds — ABNORMAL HIGH (ref 11.6–15.2)

## 2013-05-17 MED ORDER — PHYTONADIONE 5 MG PO TABS
5.0000 mg | ORAL_TABLET | Freq: Once | ORAL | Status: AC
  Administered 2013-05-17: 5 mg via ORAL
  Filled 2013-05-17: qty 1

## 2013-05-17 MED ORDER — HEPARIN SODIUM (PORCINE) 5000 UNIT/ML IJ SOLN: 5000.0000 [IU] | Freq: Three times a day (TID) | INTRAMUSCULAR | Status: DC

## 2013-05-17 NOTE — Progress Notes (Signed)
Admit: 05/07/2013 LOS: 10  67F w/ NSTEMI, ESRD (MWF as outpt, THS in house)  Subjective:  NAEON, AMS resolved  11/09 0701 - 11/10 0700 In: -  Out: 1 [Stool:1]  Filed Weights   05/13/13 1000 05/15/13 0500 05/15/13 0739  Weight: 57.2 kg (126 lb 1.7 oz) 59.6 kg (131 lb 6.3 oz) 59 kg (130 lb 1.1 oz)    Current meds: reviewed including darbepoeitin qWk, Renvela 2400 qAC Current Labs: reviewed   Outpt HD Orders Unit: [facility] Time: 3h Dialyzer:  EDW: 51.5kg K/Ca: 2K/2.5Ca Access: LUA AVF UF Proflie:  VDRA: 3.5 Hectoral qTx EPO: Epo 2200 2x/wk IV Fe:    Physical Exam:  Blood pressure 131/45, pulse 63, temperature 97.9 F (36.6 C), temperature source Oral, resp. rate 16, height 5\' 5"  (1.651 m), weight 59 kg (130 lb 1.1 oz), SpO2 97.00%. NAD, appears well IRIR CTAB, nl wob No rashes/lesions Nonfocal.  AAOx3  Assessment/Plan 1. ESRD, outpt MWF, inpt THS.  HD tomorrow.   2. HTN/Vol: stable 3. Anemia: Hb stable.  No changes.   4. MBD: Ca at goal.  No changes.  5. NSTEMI: per cardiology.    Brittany Heck MD 05/17/2013, 8:47 AM   Recent Labs Lab 05/10/13 1318  05/14/13 0447 05/15/13 0500 05/15/13 0817  NA 133*  < > 130* 130* 132*  K 4.4  < > 4.6 5.8* 5.7*  CL 91*  < > 92* 96 96  CO2 22  < > 28 20 21   GLUCOSE 145*  < > 91 73 80  BUN 69*  < > 34* 40* 41*  CREATININE 9.00*  < > 6.05* 6.85* 7.22*  CALCIUM 8.2*  < > 8.8 8.6 8.7  PHOS 8.7*  --  5.0*  --  6.9*  < > = values in this interval not displayed.  Recent Labs Lab 05/15/13 0500 05/16/13 0445 05/17/13 0415  WBC 10.5 8.9 10.1  HGB 9.7* 10.0* 9.9*  HCT 30.1* 30.8* 31.3*  MCV 90.7 91.7 92.1  PLT 295 282 293    Current Facility-Administered Medications  Medication Dose Route Frequency Provider Last Rate Last Dose  . 0.9 %  sodium chloride infusion   Intravenous Continuous Runell Gess, MD      . acetaminophen (TYLENOL) tablet 650 mg  650 mg Oral Q6H PRN Lonia Blood, MD      .  aspirin EC tablet 81 mg  81 mg Oral Daily Lennette Bihari, MD   81 mg at 05/16/13 1200  . atorvastatin (LIPITOR) tablet 40 mg  40 mg Oral Daily Dorothea Ogle, MD   40 mg at 05/16/13 0902  . cefTRIAXone (ROCEPHIN) 1 g in dextrose 5 % 50 mL IVPB  1 g Intravenous Q24H Lonia Blood, MD   1 g at 05/16/13 1604  . clopidogrel (PLAVIX) tablet 75 mg  75 mg Oral Q breakfast Lennette Bihari, MD   75 mg at 05/16/13 0901  . [START ON 05/19/2013] darbepoetin (ARANESP) injection 60 mcg  60 mcg Intravenous Q Wed-HD Maree Krabbe, MD      . diltiazem (CARDIZEM CD) 24 hr capsule 180 mg  180 mg Oral Daily Lennette Bihari, MD   180 mg at 05/16/13 1100  . feeding supplement (NEPRO CARB STEADY) liquid 237 mL  237 mL Oral PRN Maree Krabbe, MD      . fentaNYL (DURAGESIC - dosed mcg/hr) 12.5 mcg  12.5 mcg Transdermal Q72H Russella Dar, NP   12.5  mcg at 05/15/13 1321  . folic acid (FOLVITE) tablet 1 mg  1 mg Oral Daily Calvert Cantor, MD   1 mg at 05/16/13 0901  . heparin injection 1,000 Units  1,000 Units Dialysis PRN Maree Krabbe, MD      . hydrALAZINE (APRESOLINE) injection 10 mg  10 mg Intravenous UD Runell Gess, MD      . HYDROmorphone (DILAUDID) injection 0.5 mg  0.5 mg Intravenous Q2H PRN Chrystie Nose, MD   0.5 mg at 05/13/13 1145  . lidocaine (PF) (XYLOCAINE) 1 % injection 5 mL  5 mL Intradermal PRN Maree Krabbe, MD      . lidocaine-prilocaine (EMLA) cream 1 application  1 application Topical PRN Maree Krabbe, MD      . metoprolol tartrate (LOPRESSOR) tablet 25 mg  25 mg Oral BID Marykay Lex, MD   25 mg at 05/16/13 2201  . multivitamin (RENA-VIT) tablet 1 tablet  1 tablet Oral QHS Kerin Salen, PA-C   1 tablet at 05/16/13 2201  . ondansetron (ZOFRAN) tablet 4 mg  4 mg Oral Q6H PRN Dorothea Ogle, MD       Or  . ondansetron [facility]) injection 4 mg  4 mg Intravenous Q6H PRN Dorothea Ogle, MD      . pentafluoroprop-tetrafluoroeth Peggye Pitt) aerosol 1 application  1 application  Topical PRN Maree Krabbe, MD      . QUEtiapine (SEROQUEL) tablet 12.5 mg  12.5 mg Oral QHS PRN Maree Krabbe, MD      . sevelamer carbonate (RENVELA) tablet 2,400 mg  2,400 mg Oral TID WC Dorothea Ogle, MD   2,400 mg at 05/17/13 2841  . sorbitol 70 % solution 30 mL  30 mL Oral Daily PRN Calvert Cantor, MD      . traMADol (ULTRAM) tablet 50 mg  50 mg Oral Q6H PRN Lonia Blood, MD   50 mg at 05/16/13 2000

## 2013-05-17 NOTE — Progress Notes (Signed)
CARDIAC REHAB PHASE I   PRE:  Rate/Rhythm: 63 SR    BP: lying141/52, sitting 145/54    SaO2: 96 RA  MODE:  Ambulation: 180 ft   POST:  Rate/Rhythm: 76 SR    BP: sitting 144/60     SaO2: 95 RA  Pt willing to walk. Needed assist to get to EOB due to left arm restrictions. Able to stand and push Rollator with right arm (I assisted pushing left side of rollator). Fairly steady first half of walk, pt talkative. However fatigued, rested x2 standing. We used assist x2 with gait belt. Tired after walk, to recliner. VSS. Denied angina. Will f/u. [phone]   Elissa Lovett [place] CES, ACSM 05/17/2013 9:40 AM

## 2013-05-17 NOTE — Progress Notes (Addendum)
[facility] Progress Note [place] TEAM 1 - Stepdown/ICU TEAM   KRYSTALYN KUBOTA ZOX:[id number] DOB: 09/04/1927 DOA: 05/07/2013 PCP: [name], MD  Admit HPI / Brief Narrative: 79 yo female with history of atrial fibrillation on Coumadin, HTN, HLD, ESRD on HD MWF, grade II diastolic dysfunction per last 2 D ECHO 11/2012. Fell at home 10/29 and was treated in th ER for a  supracondylar humerus fracture with mild displacement and angulation of distal fragment. Splint was applied by the EDP and appointment made for pt to FU with Dr. [name]. She returned on 10/30 for inadequate pain control. She returned on  10/31 after she was found to be more confused after she had taken 2 tablets of percocet for pain. She apparently took her pain meds after HD then family noted she was somewhat confused. Per ED physician, pt became more alert compared to mental status on arrival. Of note she had presented to the ER 10/14 for palpitations.  Assessment/Plan:  Acute Encephalopathy -Likely due to percocet being used for arm pain + UTI - B12 was low and folic acid low normal so replacing both  (see below) - MS appears to have returned to baseline at this time but is troubled by sundowning   ESRD on HD M/W/F -Nephrology consulted to continue usual HD   NSTEMI -Cardiology following - cath completed 11/4  -11/7: temp pacer, atherectomy, PTCA and DES stent x 2 in RCA - complicated by hypotension that required Dopamine and Levophed  - LAD atherectomy planned for next 11/11  Atrial fibrillation w/ RVR on chronic Coumadin -Rate controlled, INR varying   Warfarin induced coagulopathy INR climbing again, despite lack of warfarin - likely indicative of malnutrition/Vit K deficiency - will give low dose oral vitamin K x1 and follow   Grade II diastolic dysfunction Well compensated at present   Supracondylar humerus fracture with mild displacement and angulation of distal fragment s/p fall  10/30 -inadequate pain control so added low dose Duragesic patch with marked improvement in pain control -Appreciate Dr. [name] assistance -XR revealed appropriate alignment of fx's so OK to continue splint and supportive care -will need to reschedule OP appointment for 1 week after dc  Anemia of chronic renal disease iron deficiency and low normal B 12 and folic acid -Hgb trend has been stable - no evidence of blood loss at present - was given SQ B12 and PO Folic acid - will need to consider OP study to determine if cannot absorb B12 - will likely dc on PO B12 until that can be accomplished-allow renal to replete iron  HTN -BP is reasonably controlled at this time  E coli UTI -cont Rocephin started 11/2 to comlete 10 day course   HLD  Code Status: FULL Family Communication: spoke w/ pt and daughter at bedside  Disposition Plan: SDU   Consultants: Cardiology Nephrology  Orthopedics  Procedures: none  Antibiotics: Cipro 10/31 >> 11/02 Rocephin 11/02 >>   DVT prophylaxis: SCDs  HPI/Subjective: Pain still controlled - slept better past 2 nights.  Denies cp, sob, or abdom pain.    Objective: Blood pressure 129/48, pulse 66, temperature 98.3 F (36.8 C), temperature source Oral, resp. rate 15, height 5\' 5"  (1.651 m), weight 130 lb 1.1 oz (59 kg), SpO2 98.00%.  Intake/Output Summary (Last 24 hours) at 05/17/13 1247 Last data filed at 05/17/13 0900  Gross per 24 hour  Intake    240 ml  Output      0 ml  Net    240 ml   Exam: General: No acute respiratory distress Lungs: Clear to auscultation bilaterally without wheezes or crackles Cardiovascular: Regular rate without murmur gallop or rub  Abdomen: Nontender, nondistended, soft, bowel sounds positive, no rebound, no ascites, no appreciable mass Extremities: No significant cyanosis, clubbing, or edema bilateral lower extremities - L UE in splint   Data Reviewed: Basic Metabolic Panel:  Recent Labs Lab  05/10/13 1318 05/11/13 0405 05/14/13 0447 05/15/13 0500 05/15/13 0817  NA 133* 133* 130* 130* 132*  K 4.4 4.2 4.6 5.8* 5.7*  CL 91* 95* 92* 96 96  CO2 22 26 28 20 21   GLUCOSE 145* 107* 91 73 80  BUN 69* 32* 34* 40* 41*  CREATININE 9.00* 5.20* 6.05* 6.85* 7.22*  CALCIUM 8.2* 7.7* 8.8 8.6 8.7  PHOS 8.7*  --  5.0*  --  6.9*   Liver Function Tests:  Recent Labs Lab 05/10/13 1318 05/11/13 0405 05/14/13 0447 05/15/13 0817  AST  --  16  --   --   ALT  --  5  --   --   ALKPHOS  --  37*  --   --   BILITOT  --  0.3  --   --   PROT  --  5.6*  --   --   ALBUMIN 2.8* 2.4* 2.6* 2.6*   CBC:  Recent Labs Lab 05/13/13 0420 05/14/13 0447 05/15/13 0500 05/16/13 0445 05/17/13 0415  WBC 8.3 9.5 10.5 8.9 10.1  HGB 9.4* 9.8* 9.7* 10.0* 9.9*  HCT 29.4* 31.0* 30.1* 30.8* 31.3*  MCV 92.5 92.3 90.7 91.7 92.1  PLT 255 270 295 282 293   Cardiac Enzymes: No results found for this basename: CKTOTAL, CKMB, CKMBINDEX, TROPONINI,  in the last 168 hours  Recent Results (from the past 240 hour(s))  MRSA PCR SCREENING     Status: None   Collection Time    05/07/13 10:30 PM      Result Value Range Status   MRSA by PCR NEGATIVE  NEGATIVE Final   Comment:            The GeneXpert MRSA Assay (FDA     approved for NASAL specimens     only), is one component of a     comprehensive MRSA colonization     surveillance program. It is not     intended to diagnose MRSA     infection nor to guide or     monitor treatment for     MRSA infections.  URINE CULTURE     Status: None   Collection Time    05/07/13 11:34 PM      Result Value Range Status   Specimen Description URINE, RANDOM   Final   Special Requests NONE   Final   Culture  Setup Time     Final   Value: 05/08/2013 04:05     Performed at [facility] Count     Final   Value: >=100,000 COLONIES/ML     Performed at [facility]   Culture     Final   Value: ESCHERICHIA COLI     Performed at [facility]   Report Status 05/10/2013 FINAL   Final   Organism ID, Bacteria ESCHERICHIA COLI   Final     Studies:  Recent x-ray studies have been reviewed in detail by the Attending Physician  Scheduled Meds:  Scheduled Meds: . aspirin EC  81 mg Oral  Daily  . atorvastatin  40 mg Oral Daily  . cefTRIAXone (ROCEPHIN)  IV  1 g Intravenous Q24H  . clopidogrel  75 mg Oral Q breakfast  . [START ON 05/19/2013] darbepoetin (ARANESP) injection - DIALYSIS  60 mcg Intravenous Q Wed-HD  . diltiazem  180 mg Oral Daily  . fentaNYL  12.5 mcg Transdermal Q72H  . folic acid  1 mg Oral Daily  . hydrALAZINE  10 mg Intravenous UD  . metoprolol tartrate  25 mg Oral BID  . multivitamin  1 tablet Oral QHS  . sevelamer carbonate  2,400 mg Oral TID WC    Time spent on care of this patient: 25 mins   ELLIS,[name], ANP [phone] 05/17/2013, 12:47 PM  I have personally examined this patient and reviewed the entire database. I have reviewed the above note, made any necessary editorial changes, and agree with its content.  Lonia Blood, MD [facility]

## 2013-05-17 NOTE — Progress Notes (Signed)
Subjective: Denies CP/SOB. Left arm remains sore, but is getting better. She feels she is getting stronger. Has worked with PT.   Objective: Vital signs in last 24 hours: Temp:  [97.6 F (36.4 C)-98.5 F (36.9 C)] 97.9 F (36.6 C) (11/10 0806) Pulse Rate:  [63] 63 (11/10 0806) Resp:  [12-18] 16 (11/10 0806) BP: (125-151)/(45-76) 131/45 mmHg (11/10 0800) SpO2:  [97 %] 97 % (11/10 0806) Last BM Date: 05/16/13  Intake/Output from previous day: 11/09 0701 - 11/10 0700 In: -  Out: 1 [Stool:1] Intake/Output this shift:    Medications Current Facility-Administered Medications  Medication Dose Route Frequency Provider Last Rate Last Dose  . 0.9 %  sodium chloride infusion   Intravenous Continuous Runell Gess, MD      . acetaminophen (TYLENOL) tablet 650 mg  650 mg Oral Q6H PRN Lonia Blood, MD      . aspirin EC tablet 81 mg  81 mg Oral Daily Lennette Bihari, MD   81 mg at 05/16/13 1200  . atorvastatin (LIPITOR) tablet 40 mg  40 mg Oral Daily Dorothea Ogle, MD   40 mg at 05/16/13 0902  . cefTRIAXone (ROCEPHIN) 1 g in dextrose 5 % 50 mL IVPB  1 g Intravenous Q24H Lonia Blood, MD   1 g at 05/16/13 1604  . clopidogrel (PLAVIX) tablet 75 mg  75 mg Oral Q breakfast Lennette Bihari, MD   75 mg at 05/16/13 0901  . [START ON 05/19/2013] darbepoetin (ARANESP) injection 60 mcg  60 mcg Intravenous Q Wed-HD Maree Krabbe, MD      . diltiazem (CARDIZEM CD) 24 hr capsule 180 mg  180 mg Oral Daily Lennette Bihari, MD   180 mg at 05/16/13 1100  . feeding supplement (NEPRO CARB STEADY) liquid 237 mL  237 mL Oral PRN Maree Krabbe, MD      . fentaNYL (DURAGESIC - dosed mcg/hr) 12.5 mcg  12.5 mcg Transdermal Q72H Russella Dar, NP   12.5 mcg at 05/15/13 1321  . folic acid (FOLVITE) tablet 1 mg  1 mg Oral Daily Calvert Cantor, MD   1 mg at 05/16/13 0901  . heparin injection 1,000 Units  1,000 Units Dialysis PRN Maree Krabbe, MD      . hydrALAZINE (APRESOLINE) injection 10 mg  10  mg Intravenous UD Runell Gess, MD      . HYDROmorphone (DILAUDID) injection 0.5 mg  0.5 mg Intravenous Q2H PRN Chrystie Nose, MD   0.5 mg at 05/13/13 1145  . lidocaine (PF) (XYLOCAINE) 1 % injection 5 mL  5 mL Intradermal PRN Maree Krabbe, MD      . lidocaine-prilocaine (EMLA) cream 1 application  1 application Topical PRN Maree Krabbe, MD      . metoprolol tartrate (LOPRESSOR) tablet 25 mg  25 mg Oral BID Marykay Lex, MD   25 mg at 05/16/13 2201  . multivitamin (RENA-VIT) tablet 1 tablet  1 tablet Oral QHS Kerin Salen, PA-C   1 tablet at 05/16/13 2201  . ondansetron (ZOFRAN) tablet 4 mg  4 mg Oral Q6H PRN Dorothea Ogle, MD       Or  . ondansetron [facility]) injection 4 mg  4 mg Intravenous Q6H PRN Dorothea Ogle, MD      . pentafluoroprop-tetrafluoroeth Peggye Pitt) aerosol 1 application  1 application Topical PRN Maree Krabbe, MD      . QUEtiapine (SEROQUEL) tablet 12.5  mg  12.5 mg Oral QHS PRN Maree Krabbe, MD      . sevelamer carbonate (RENVELA) tablet 2,400 mg  2,400 mg Oral TID WC Dorothea Ogle, MD   2,400 mg at 05/17/13 4540  . sorbitol 70 % solution 30 mL  30 mL Oral Daily PRN Calvert Cantor, MD      . traMADol (ULTRAM) tablet 50 mg  50 mg Oral Q6H PRN Lonia Blood, MD   50 mg at 05/16/13 2000    PE: General appearance: alert, cooperative and no distress Lungs: clear to auscultation bilaterally Heart: regular rate and rhythm and 3/6 AS murmur Extremities: no LEE Pulses: 2+ and symmetric Skin: warm and dry Neurologic: Grossly normal  Lab Results:   Recent Labs  05/15/13 0500 05/16/13 0445 05/17/13 0415  WBC 10.5 8.9 10.1  HGB 9.7* 10.0* 9.9*  HCT 30.1* 30.8* 31.3*  PLT 295 282 293   BMET  Recent Labs  05/15/13 0500 05/15/13 0817  NA 130* 132*  K 5.8* 5.7*  CL 96 96  CO2 20 21  GLUCOSE 73 80  BUN 40* 41*  CREATININE 6.85* 7.22*  CALCIUM 8.6 8.7   PT/INR  Recent Labs  05/15/13 0500 05/16/13 0445 05/17/13 0415  LABPROT 24.0*  20.4* 22.0*  INR 2.23* 1.80* 1.99*     Assessment/Plan  Principal Problem:   Encephalopathy, toxic Active Problems:   Anemia   HTN (hypertension)   ESRD (end stage renal disease) on dialysis   Long term (current) use of anticoagulants   Atrial fibrillation with RVR   NSTEMI (non-ST elevated myocardial infarction)   UTI (urinary tract infection)  Plan:  Day 3 s/p complex PCI/HSRAcutting balloon/ stenting to RCA. Plan HSRA of eccentric calcified OM stenosis on Wednesday. No CP. Will plan of HD Tue/Thurs. HR and BP both stable. Will continue to monitor. MD to follow.      LOS: 10 days    Brittainy M. Sharol Harness, PA-C 05/17/2013 9:20 AM   I have seen and examined the patient along with Brittainy M. Sharol Harness, PA-C.  I have reviewed the chart, notes and new data.  I agree with PA's note.  Key new complaints: feels well, without dyspnea, angina or groin problems Key examination changes: no clinical signs of hypervolemia, groin without hematoma   PLAN: PCI of LCX-OM on Wednesday. Can go to telemetry today. Watch INR - not sure why it is heading back up.  Thurmon Fair, MD, [facility] [phone] 05/17/2013, 11:31 AM

## 2013-05-17 NOTE — Progress Notes (Signed)
PT Cancellation Note  Patient Details Name: [name] MRN: [id number] DOB: March 23, 1928   Cancelled Treatment:    Reason Eval/Treat Not Completed: Other (comment) (pt eating lunch. Will attempt later if time allows)   Toney Sang [facility] 05/17/2013, 12:58 PM Delaney Meigs, PT [phone]

## 2013-05-18 DIAGNOSIS — E785 Hyperlipidemia, unspecified: Secondary | ICD-10-CM | POA: Diagnosis present

## 2013-05-18 DIAGNOSIS — I2510 Atherosclerotic heart disease of native coronary artery without angina pectoris: Secondary | ICD-10-CM | POA: Diagnosis present

## 2013-05-18 DIAGNOSIS — W19XXXA Unspecified fall, initial encounter: Secondary | ICD-10-CM

## 2013-05-18 DIAGNOSIS — Y92009 Unspecified place in unspecified non-institutional (private) residence as the place of occurrence of the external cause: Secondary | ICD-10-CM

## 2013-05-18 LAB — CBC
MCH: 29.5 pg (ref 26.0–34.0)
MCHC: 32 g/dL (ref 30.0–36.0)
MCV: 92.4 fL (ref 78.0–100.0)
Platelets: 314 10*3/uL (ref 150–400)
RDW: 14.4 % (ref 11.5–15.5)

## 2013-05-18 LAB — PROTIME-INR: Prothrombin Time: 18.9 seconds — ABNORMAL HIGH (ref 11.6–15.2)

## 2013-05-18 MED ORDER — SODIUM CHLORIDE 0.9 % IV SOLN: 100.0000 mL | INTRAVENOUS | Status: DC | PRN

## 2013-05-18 MED ORDER — ALTEPLASE 2 MG IJ SOLR
2.0000 mg | Freq: Once | INTRAMUSCULAR | Status: DC | PRN
  Filled 2013-05-18: qty 2

## 2013-05-18 MED ORDER — DARBEPOETIN ALFA-POLYSORBATE 60 MCG/0.3ML IJ SOLN
INTRAMUSCULAR | Status: AC
  Filled 2013-05-18: qty 0.3

## 2013-05-18 MED ORDER — DIAZEPAM 2 MG PO TABS
2.0000 mg | ORAL_TABLET | ORAL | Status: AC
  Administered 2013-05-19: 2 mg via ORAL
  Filled 2013-05-18: qty 1

## 2013-05-18 MED ORDER — SODIUM CHLORIDE 0.9 % IV SOLN
1.0000 mL/kg/h | INTRAVENOUS | Status: DC
  Administered 2013-05-18: 1 mL/kg/h via INTRAVENOUS

## 2013-05-18 NOTE — Procedures (Signed)
I was present at this dialysis session. I have reviewed the session itself and made appropriate changes.   Sabra Heck  MD 05/18/2013, 7:50 PM

## 2013-05-18 NOTE — Progress Notes (Signed)
DAILY PROGRESS NOTE  Subjective:  No complaints. Left arm is not hurting as much. No chest pain.  RN's report she is "sundowning" at night.  Objective:  Temp:  [97.3 F (36.3 C)-98.3 F (36.8 C)] 97.4 F (36.3 C) (11/11 0800) Pulse Rate:  [61-67] 65 (11/11 0800) Resp:  [11-17] 13 (11/11 0400) BP: (115-144)/(42-52) 136/49 mmHg (11/11 0800) SpO2:  [94 %-98 %] 96 % (11/11 0800) Weight change:   Intake/Output from previous day: 11/10 0701 - 11/11 0700 In: 480 [P.O.:480] Out: 1 [Urine:1]  Intake/Output from this shift:    Medications: Current Facility-Administered Medications  Medication Dose Route Frequency Provider Last Rate Last Dose  . 0.9 %  sodium chloride infusion   Intravenous Continuous Runell Gess, MD      . acetaminophen (TYLENOL) tablet 650 mg  650 mg Oral Q6H PRN Lonia Blood, MD      . aspirin EC tablet 81 mg  81 mg Oral Daily Lennette Bihari, MD   81 mg at 05/17/13 1000  . atorvastatin (LIPITOR) tablet 40 mg  40 mg Oral Daily Dorothea Ogle, MD   40 mg at 05/17/13 1015  . cefTRIAXone (ROCEPHIN) 1 g in dextrose 5 % 50 mL IVPB  1 g Intravenous Q24H Lonia Blood, MD   1 g at 05/17/13 1501  . clopidogrel (PLAVIX) tablet 75 mg  75 mg Oral Q breakfast Lennette Bihari, MD   75 mg at 05/18/13 0803  . [START ON 05/19/2013] darbepoetin (ARANESP) injection 60 mcg  60 mcg Intravenous Q Wed-HD Maree Krabbe, MD      . diltiazem (CARDIZEM CD) 24 hr capsule 180 mg  180 mg Oral Daily Lennette Bihari, MD   180 mg at 05/17/13 1015  . feeding supplement (NEPRO CARB STEADY) liquid 237 mL  237 mL Oral PRN Maree Krabbe, MD      . fentaNYL (DURAGESIC - dosed mcg/hr) 12.5 mcg  12.5 mcg Transdermal Q72H Russella Dar, NP   12.5 mcg at 05/15/13 1321  . folic acid (FOLVITE) tablet 1 mg  1 mg Oral Daily Calvert Cantor, MD   1 mg at 05/17/13 1015  . heparin injection 1,000 Units  1,000 Units Dialysis PRN Maree Krabbe, MD      . hydrALAZINE (APRESOLINE) injection 10 mg   10 mg Intravenous UD Runell Gess, MD      . HYDROmorphone (DILAUDID) injection 0.5 mg  0.5 mg Intravenous Q2H PRN Chrystie Nose, MD   0.5 mg at 05/13/13 1145  . lidocaine (PF) (XYLOCAINE) 1 % injection 5 mL  5 mL Intradermal PRN Maree Krabbe, MD      . lidocaine-prilocaine (EMLA) cream 1 application  1 application Topical PRN Maree Krabbe, MD      . metoprolol tartrate (LOPRESSOR) tablet 25 mg  25 mg Oral BID Marykay Lex, MD   25 mg at 05/17/13 2123  . multivitamin (RENA-VIT) tablet 1 tablet  1 tablet Oral QHS Kerin Salen, PA-C   1 tablet at 05/17/13 2123  . ondansetron (ZOFRAN) tablet 4 mg  4 mg Oral Q6H PRN Dorothea Ogle, MD       Or  . ondansetron [facility]) injection 4 mg  4 mg Intravenous Q6H PRN Dorothea Ogle, MD      . pentafluoroprop-tetrafluoroeth Peggye Pitt) aerosol 1 application  1 application Topical PRN Maree Krabbe, MD      . QUEtiapine (SEROQUEL) tablet  12.5 mg  12.5 mg Oral QHS PRN Maree Krabbe, MD      . sevelamer carbonate (RENVELA) tablet 2,400 mg  2,400 mg Oral TID WC Dorothea Ogle, MD   2,400 mg at 05/18/13 0803  . sorbitol 70 % solution 30 mL  30 mL Oral Daily PRN Calvert Cantor, MD      . traMADol Janean Sark) tablet 50 mg  50 mg Oral Q6H PRN Lonia Blood, MD   50 mg at 05/17/13 2124    Physical Exam: General appearance: alert and no distress Neck: no carotid bruit and no JVD Lungs: clear to auscultation bilaterally Heart: regular rate and rhythm, S1, S2 normal and systolic murmur: midsystolic 3/6, crescendo at 2nd right intercostal space Abdomen: soft, non-tender; bowel sounds normal; no masses,  no organomegaly Extremities: left forearm is splinted in sling Pulses: 2+ and symmetric Skin: Skin color, texture, turgor normal. No rashes or lesions Neurologic: Grossly normal Psych: Pleasant, normal mood  Lab Results: Results for orders placed during the hospital encounter of 05/07/13 (from the past 48 hour(s))  CBC     Status: Abnormal    Collection Time    05/17/13  4:15 AM      Result Value Range   WBC 10.1  4.0 - 10.5 K/uL   RBC 3.40 (*) 3.87 - 5.11 MIL/uL   Hemoglobin 9.9 (*) 12.0 - 15.0 g/dL   HCT 16.1 (*) 09.6 - 04.5 %   MCV 92.1  78.0 - 100.0 fL   MCH 29.1  26.0 - 34.0 pg   MCHC 31.6  30.0 - 36.0 g/dL   RDW 40.9  81.1 - 91.4 %   Platelets 293  150 - 400 K/uL  PROTIME-INR     Status: Abnormal   Collection Time    05/17/13  4:15 AM      Result Value Range   Prothrombin Time 22.0 (*) 11.6 - 15.2 seconds   INR 1.99 (*) 0.00 - 1.49  APTT     Status: Abnormal   Collection Time    05/17/13  9:35 PM      Result Value Range   aPTT 38 (*) 24 - 37 seconds   Comment:            IF BASELINE aPTT IS ELEVATED,     SUGGEST PATIENT RISK ASSESSMENT     BE USED TO DETERMINE APPROPRIATE     ANTICOAGULANT THERAPY.  CBC     Status: Abnormal   Collection Time    05/18/13  5:20 AM      Result Value Range   WBC 10.5  4.0 - 10.5 K/uL   RBC 3.15 (*) 3.87 - 5.11 MIL/uL   Hemoglobin 9.3 (*) 12.0 - 15.0 g/dL   HCT 78.2 (*) 95.6 - 21.3 %   MCV 92.4  78.0 - 100.0 fL   MCH 29.5  26.0 - 34.0 pg   MCHC 32.0  30.0 - 36.0 g/dL   RDW 08.6  57.8 - 46.9 %   Platelets 314  150 - 400 K/uL  PROTIME-INR     Status: Abnormal   Collection Time    05/18/13  5:20 AM      Result Value Range   Prothrombin Time 18.9 (*) 11.6 - 15.2 seconds   INR 1.63 (*) 0.00 - 1.49    Imaging: No results found.  Assessment:  1. Principal Problem: 2.   Encephalopathy, toxic 3. Active Problems: 4.   Anemia 5.   HTN (hypertension)  6.   ESRD (end stage renal disease) on dialysis 7.   Moderate aortic stenosis 8.   Long term (current) use of anticoagulants 9.   Atrial fibrillation with RVR- PAF on adm 10/31 10.   NSTEMI (non-ST elevated myocardial infarction) 11.   UTI (urinary tract infection) 12.   CAD- OM HSRA/DES 05/17/13- residual CFX dis 13.   Fall at home prior to admission 10/31- Fx Lt arm 14.   Dyslipidemia 15.   Plan:  1. Chest pain  free. Arm pain is well-managed. Need to work on environmental variables at night to help with sundowning. Plan for HSRA and PCI to LCx tomorrow with Dr. Tresa Endo.  INR is again drifting down - will need to restart warfarin after the procedure. I do not see an indication for bridging therapy (she takes it for PAF).  May be for dialysis today - last was on Saturday.  Time Spent Directly with Patient:  15 minutes  Length of Stay:  LOS: 11 days   Chrystie Nose, MD, [facility] Attending Cardiologist [facility]  HILTY,Kenneth C 05/18/2013, 8:58 AM

## 2013-05-18 NOTE — Progress Notes (Signed)
Admit: 05/07/2013 LOS: 11  53F w/ NSTEMI, ESRD (MWF as outpt, THS in house)  Subjective:  No new events  No CP or SOB  11/10 0701 - 11/11 0700 In: 480 [P.O.:480] Out: 1 [Urine:1]  Filed Weights   05/13/13 1000 05/15/13 0500 05/15/13 0739  Weight: 57.2 kg (126 lb 1.7 oz) 59.6 kg (131 lb 6.3 oz) 59 kg (130 lb 1.1 oz)    Current meds: reviewed including darbepoeitin qWk, Renvela 2400 qAC Current Labs: reviewed   Outpt HD Orders Unit: [facility] Time: 3h Dialyzer:  EDW: 51.5kg K/Ca: 2K/2.5Ca Access: LUA AVF UF Proflie:  VDRA: 3.5 Hectoral qTx EPO: Epo 2200 2x/wk IV Fe:    Physical Exam:  Blood pressure 136/49, pulse 65, temperature 97.4 F (36.3 C), temperature source Oral, resp. rate 13, height 5\' 5"  (1.651 m), weight 59 kg (130 lb 1.1 oz), SpO2 96.00%. NAD, appears well IRIR CTAB, nl wob No rashes/lesions Nonfocal.  AAOx3  Assessment/Plan 1. ESRD, outpt MWF, inpt THS.  HD tomorrow.   2. HTN/Vol: stable 3. Anemia: Hb stable.  No changes.  Cont ESA.  High ferritin, TSat 28% recently 4. MBD: Ca at goal.  No changes.  Cont binder.   5. NSTEMI: per cardiology.  Repeat LHC tomorrow.    Sabra Heck MD 05/18/2013, 10:31 AM   Recent Labs Lab 05/14/13 0447 05/15/13 0500 05/15/13 0817  NA 130* 130* 132*  K 4.6 5.8* 5.7*  CL 92* 96 96  CO2 28 20 21   GLUCOSE 91 73 80  BUN 34* 40* 41*  CREATININE 6.05* 6.85* 7.22*  CALCIUM 8.8 8.6 8.7  PHOS 5.0*  --  6.9*    Recent Labs Lab 05/16/13 0445 05/17/13 0415 05/18/13 0520  WBC 8.9 10.1 10.5  HGB 10.0* 9.9* 9.3*  HCT 30.8* 31.3* 29.1*  MCV 91.7 92.1 92.4  PLT 282 293 314    Current Facility-Administered Medications  Medication Dose Route Frequency Provider Last Rate Last Dose  . 0.9 %  sodium chloride infusion   Intravenous Continuous Runell Gess, MD      . acetaminophen (TYLENOL) tablet 650 mg  650 mg Oral Q6H PRN Lonia Blood, MD      . aspirin EC tablet 81 mg  81 mg Oral Daily  Lennette Bihari, MD   81 mg at 05/17/13 1000  . atorvastatin (LIPITOR) tablet 40 mg  40 mg Oral Daily Dorothea Ogle, MD   40 mg at 05/17/13 1015  . cefTRIAXone (ROCEPHIN) 1 g in dextrose 5 % 50 mL IVPB  1 g Intravenous Q24H Lonia Blood, MD   1 g at 05/17/13 1501  . clopidogrel (PLAVIX) tablet 75 mg  75 mg Oral Q breakfast Lennette Bihari, MD   75 mg at 05/18/13 0803  . [START ON 05/19/2013] darbepoetin (ARANESP) injection 60 mcg  60 mcg Intravenous Q Wed-HD Maree Krabbe, MD      . diltiazem (CARDIZEM CD) 24 hr capsule 180 mg  180 mg Oral Daily Lennette Bihari, MD   180 mg at 05/17/13 1015  . feeding supplement (NEPRO CARB STEADY) liquid 237 mL  237 mL Oral PRN Maree Krabbe, MD      . fentaNYL (DURAGESIC - dosed mcg/hr) 12.5 mcg  12.5 mcg Transdermal Q72H Russella Dar, NP   12.5 mcg at 05/15/13 1321  . folic acid (FOLVITE) tablet 1 mg  1 mg Oral Daily Calvert Cantor, MD   1 mg at 05/17/13 1015  .  heparin injection 1,000 Units  1,000 Units Dialysis PRN Maree Krabbe, MD      . hydrALAZINE (APRESOLINE) injection 10 mg  10 mg Intravenous UD Runell Gess, MD      . HYDROmorphone (DILAUDID) injection 0.5 mg  0.5 mg Intravenous Q2H PRN Chrystie Nose, MD   0.5 mg at 05/13/13 1145  . lidocaine (PF) (XYLOCAINE) 1 % injection 5 mL  5 mL Intradermal PRN Maree Krabbe, MD      . lidocaine-prilocaine (EMLA) cream 1 application  1 application Topical PRN Maree Krabbe, MD      . metoprolol tartrate (LOPRESSOR) tablet 25 mg  25 mg Oral BID Marykay Lex, MD   25 mg at 05/17/13 2123  . multivitamin (RENA-VIT) tablet 1 tablet  1 tablet Oral QHS Kerin Salen, PA-C   1 tablet at 05/17/13 2123  . ondansetron (ZOFRAN) tablet 4 mg  4 mg Oral Q6H PRN Dorothea Ogle, MD       Or  . ondansetron [facility]) injection 4 mg  4 mg Intravenous Q6H PRN Dorothea Ogle, MD      . pentafluoroprop-tetrafluoroeth Peggye Pitt) aerosol 1 application  1 application Topical PRN Maree Krabbe, MD      .  QUEtiapine (SEROQUEL) tablet 12.5 mg  12.5 mg Oral QHS PRN Maree Krabbe, MD      . sevelamer carbonate (RENVELA) tablet 2,400 mg  2,400 mg Oral TID WC Dorothea Ogle, MD   2,400 mg at 05/18/13 0803  . sorbitol 70 % solution 30 mL  30 mL Oral Daily PRN Calvert Cantor, MD      . traMADol (ULTRAM) tablet 50 mg  50 mg Oral Q6H PRN Lonia Blood, MD   50 mg at 05/17/13 2124

## 2013-05-18 NOTE — Progress Notes (Signed)
CARDIAC REHAB PHASE I   PRE:  Rate/Rhythm: 68 SR  BP:  Supine: 147/62  Sitting:   Standing:    SaO2: 97 RA  MODE:  Ambulation: 170 ft   POST:  Rate/Rhythm: 82 SR  BP:  Supine:   Sitting: 165/59  Standing:    SaO2: 96 RA 1102-1125 Assisted X 2 used gait belt and hand held to ambulate. Pt a little wobbly when first standing. She was able to walk 170 feet without c/o of cp or SOB. Pt back to bed after walk with call light in reach and daughter present. Pt has two canes, walker and w/c at home that was her husband's. We ask daughter to bring in cane for [place] to try for next walk.  Melina Copa RN 05/18/2013 11:22 AM

## 2013-05-18 NOTE — Progress Notes (Signed)
Physical Therapy Cancellation Note  Pt currently HD at this time.  Will plan to see tomorrow.  [place], [place] DPT [phone]

## 2013-05-18 NOTE — Progress Notes (Signed)
[facility] Progress Note [place] TEAM 1 - Stepdown/ICU TEAM   JI FAIRBURN ZOX:[id number] DOB: [birth year] DOA: 05/07/2013 PCP: [name], MD  Admit HPI / Brief Narrative: 79 yo female with history of atrial fibrillation on Coumadin, HTN, HLD, ESRD on HD MWF, grade II diastolic dysfunction per last 2 D ECHO 11/2012. Fell at home 10/29 and was treated in th ER for a  supracondylar humerus fracture with mild displacement and angulation of distal fragment. Splint was applied by the EDP and appointment made for pt to FU with Dr. [name]. She returned on 10/30 for inadequate pain control. She returned on  10/31 after she was found to be more confused after she had taken 2 tablets of percocet for pain. She apparently took her pain meds after HD then family noted she was somewhat confused. Per ED physician, pt became more alert compared to mental status on arrival. Of note she had presented to the ER 10/14 for palpitations.  Assessment/Plan:  Acute Encephalopathy -Likely due to percocet being used for arm pain + UTI - B12 was low and folic acid low normal so replacing both  (see below) - MS appears to have returned to baseline at this time but is troubled by sundowning   ESRD on HD M/W/F -Nephrology consulted to continue usual HD   NSTEMI -Cardiology following - cath completed 11/4  -11/7: temp pacer, atherectomy, PTCA and DES stent x 2 in RCA - complicated by hypotension that required Dopamine and Levophed  - LAD atherectomy planned for next 11/12  Atrial fibrillation w/ RVR on chronic Coumadin -Rate controlled, INR varying   Warfarin induced coagulopathy INR climbing again, despite lack of warfarin - likely indicative of malnutrition/Vit K deficiency - will give low dose oral vitamin K x1 and follow   Grade II diastolic dysfunction Well compensated at present   Supracondylar humerus fracture with mild displacement and angulation of distal fragment s/p fall  10/30 -inadequate pain control so added low dose Duragesic patch with marked improvement in pain control -Appreciate Dr. [name] assistance -XR revealed appropriate alignment of fx's so OK to continue splint and supportive care -will need to reschedule OP appointment for 1 week after dc  Anemia of chronic renal disease iron deficiency and low normal B 12 and folic acid -Hgb trend has been stable - no evidence of blood loss at present - was given SQ B12 and PO Folic acid - will need to consider OP study to determine if cannot absorb B12 - will likely dc on PO B12 until that can be accomplished-allow renal to replete iron  HTN -BP is reasonably controlled at this time  E coli UTI -cont Rocephin started 11/2 to complete 10 day course 11/12   HLD  Code Status: FULL Family Communication: spoke w/ pt and daughter at bedside  Disposition Plan: SDU   Consultants: Cardiology Nephrology  Orthopedics  Procedures: none  Antibiotics: Cipro 10/31 >> 11/02 Rocephin 11/02 >>   DVT prophylaxis: SCDs  HPI/Subjective: Pain still controlled - issues with sundowning and some daytime confusion   Objective: Blood pressure 142/57, pulse 66, temperature 98.3 F (36.8 C), temperature source Oral, resp. rate 13, height 5\' 5"  (1.651 m), weight 130 lb 1.1 oz (59 kg), SpO2 96.00%.  Intake/Output Summary (Last 24 hours) at 05/18/13 1455 Last data filed at 05/18/13 0900  Gross per 24 hour  Intake    300 ml  Output      1 ml  Net    299  ml   Exam: General: No acute respiratory distress Lungs: Clear to auscultation bilaterally without wheezes or crackles Cardiovascular: Regular rate without murmur gallop or rub  Abdomen: Nontender, nondistended, soft, bowel sounds positive, no rebound, no ascites, no appreciable mass Extremities: No significant cyanosis, clubbing, or edema bilateral lower extremities - L UE in splint   Data Reviewed: Basic Metabolic Panel:  Recent Labs Lab 05/14/13 0447  05/15/13 0500 05/15/13 0817  NA 130* 130* 132*  K 4.6 5.8* 5.7*  CL 92* 96 96  CO2 28 20 21   GLUCOSE 91 73 80  BUN 34* 40* 41*  CREATININE 6.05* 6.85* 7.22*  CALCIUM 8.8 8.6 8.7  PHOS 5.0*  --  6.9*   Liver Function Tests:  Recent Labs Lab 05/14/13 0447 05/15/13 0817  ALBUMIN 2.6* 2.6*   CBC:  Recent Labs Lab 05/14/13 0447 05/15/13 0500 05/16/13 0445 05/17/13 0415 05/18/13 0520  WBC 9.5 10.5 8.9 10.1 10.5  HGB 9.8* 9.7* 10.0* 9.9* 9.3*  HCT 31.0* 30.1* 30.8* 31.3* 29.1*  MCV 92.3 90.7 91.7 92.1 92.4  PLT 270 295 282 293 314   Cardiac Enzymes: No results found for this basename: CKTOTAL, CKMB, CKMBINDEX, TROPONINI,  in the last 168 hours  No results found for this or any previous visit (from the past 240 hour(s)).   Studies:  Recent x-ray studies have been reviewed in detail by the Attending Physician  Scheduled Meds:  Scheduled Meds: . aspirin EC  81 mg Oral Daily  . atorvastatin  40 mg Oral Daily  . cefTRIAXone (ROCEPHIN)  IV  1 g Intravenous Q24H  . clopidogrel  75 mg Oral Q breakfast  . [START ON 05/19/2013] darbepoetin (ARANESP) injection - DIALYSIS  60 mcg Intravenous Q Wed-HD  . diltiazem  180 mg Oral Daily  . fentaNYL  12.5 mcg Transdermal Q72H  . folic acid  1 mg Oral Daily  . hydrALAZINE  10 mg Intravenous UD  . metoprolol tartrate  25 mg Oral BID  . multivitamin  1 tablet Oral QHS  . sevelamer carbonate  2,400 mg Oral TID WC    Time spent on care of this patient: 25 mins   ELLIS,[name], ANP [phone] 05/18/2013, 2:55 PM  I have examined the patient, reviewed the chart and modified the above note which I agree with.   Wilkes Potvin,MD [phone] 05/18/2013, 6:17 PM

## 2013-05-19 ENCOUNTER — Encounter (HOSPITAL_COMMUNITY)
Admission: EM | Disposition: A | Discharge status: Home / Self Care | Payer: Medicare Other | Source: Home / Self Care | Attending: Internal Medicine

## 2013-05-19 DIAGNOSIS — I2510 Atherosclerotic heart disease of native coronary artery without angina pectoris: Secondary | ICD-10-CM

## 2013-05-19 DIAGNOSIS — I214 Non-ST elevation (NSTEMI) myocardial infarction: Secondary | ICD-10-CM | POA: Diagnosis not present

## 2013-05-19 DIAGNOSIS — R4182 Altered mental status, unspecified: Secondary | ICD-10-CM | POA: Diagnosis not present

## 2013-05-19 HISTORY — PX: PERCUTANEOUS CORONARY ROTOBLATOR INTERVENTION (PCI-R): SHX5484

## 2013-05-19 LAB — CBC
HCT: 31.3 % — ABNORMAL LOW (ref 36.0–46.0)
Hemoglobin: 10 g/dL — ABNORMAL LOW (ref 12.0–15.0)
MCH: 29.6 pg (ref 26.0–34.0)
MCHC: 31.9 g/dL (ref 30.0–36.0)
MCV: 92.6 fL (ref 78.0–100.0)
RDW: 14.4 % (ref 11.5–15.5)

## 2013-05-19 LAB — POCT ACTIVATED CLOTTING TIME
Activated Clotting Time: 539 seconds
Activated Clotting Time: 191 seconds
Activated Clotting Time: 176 seconds
Activated Clotting Time: 171 seconds

## 2013-05-19 LAB — BASIC METABOLIC PANEL
BUN: 15 mg/dL (ref 6–23)
CO2: 29 mEq/L (ref 19–32)
Calcium: 8.6 mg/dL (ref 8.4–10.5)
Chloride: 97 mEq/L (ref 96–112)
Creatinine, Ser: 4.87 mg/dL — ABNORMAL HIGH (ref 0.50–1.10)
GFR calc Af Amer: 9 mL/min — ABNORMAL LOW (ref >90)
GFR calc non Af Amer: 8 mL/min — ABNORMAL LOW (ref >90)
Glucose, Bld: 83 mg/dL (ref 70–99)
Potassium: 3.6 mEq/L (ref 3.5–5.1)
Sodium: 137 mEq/L (ref 135–145)

## 2013-05-19 SURGERY — PERCUTANEOUS CORONARY ROTOBLATOR INTERVENTION (PCI-R)
Anesthesia: LOCAL

## 2013-05-19 MED ORDER — MIDAZOLAM HCL 2 MG/2ML IJ SOLN
INTRAMUSCULAR | Status: AC
  Filled 2013-05-19: qty 2

## 2013-05-19 MED ORDER — LIDOCAINE HCL (PF) 1 % IJ SOLN
INTRAMUSCULAR | Status: AC
  Filled 2013-05-19: qty 30

## 2013-05-19 MED ORDER — SODIUM CHLORIDE 0.9 % IV SOLN: INTRAVENOUS | Status: DC

## 2013-05-19 MED ORDER — SODIUM CHLORIDE 0.9 % IV SOLN: 100.0000 mL | INTRAVENOUS | Status: DC | PRN

## 2013-05-19 MED ORDER — BIVALIRUDIN 250 MG IV SOLR
INTRAVENOUS | Status: AC
  Filled 2013-05-19: qty 250

## 2013-05-19 MED ORDER — FENTANYL CITRATE 0.05 MG/ML IJ SOLN
INTRAMUSCULAR | Status: AC
  Filled 2013-05-19: qty 2

## 2013-05-19 MED ORDER — ASPIRIN EC 81 MG PO TBEC: 81.0000 mg | DELAYED_RELEASE_TABLET | Freq: Every day | ORAL | Status: DC

## 2013-05-19 MED ORDER — NITROGLYCERIN 0.2 MG/ML ON CALL CATH LAB
INTRAVENOUS | Status: AC
  Filled 2013-05-19: qty 1

## 2013-05-19 MED ORDER — CLOPIDOGREL BISULFATE 75 MG PO TABS: 75.0000 mg | ORAL_TABLET | Freq: Every day | ORAL | Status: DC

## 2013-05-19 MED ORDER — ALTEPLASE 2 MG IJ SOLR
2.0000 mg | Freq: Once | INTRAMUSCULAR | Status: AC | PRN
  Filled 2013-05-19: qty 2

## 2013-05-19 MED ORDER — ACETAMINOPHEN 325 MG PO TABS: 650.0000 mg | ORAL_TABLET | ORAL | Status: DC | PRN

## 2013-05-19 MED ORDER — HEPARIN (PORCINE) IN NACL 2-0.9 UNIT/ML-% IJ SOLN
INTRAMUSCULAR | Status: AC
  Filled 2013-05-19: qty 1000

## 2013-05-19 MED ORDER — ATROPINE SULFATE 0.1 MG/ML IJ SOLN
INTRAMUSCULAR | Status: AC
  Filled 2013-05-19: qty 10

## 2013-05-19 MED ORDER — ONDANSETRON HCL 4 MG/2ML IJ SOLN: 4.0000 mg | Freq: Four times a day (QID) | INTRAMUSCULAR | Status: DC | PRN

## 2013-05-19 MED ORDER — HEPARIN SODIUM (PORCINE) 1000 UNIT/ML DIALYSIS: 20.0000 [IU]/kg | INTRAMUSCULAR | Status: DC | PRN

## 2013-05-19 MED ORDER — DOXERCALCIFEROL 4 MCG/2ML IV SOLN
3.5000 ug | INTRAVENOUS | Status: DC
  Administered 2013-05-20 – 2013-05-21 (×2): 3.5 ug via INTRAVENOUS
  Filled 2013-05-19 (×2): qty 2

## 2013-05-19 MED ORDER — VERAPAMIL HCL 2.5 MG/ML IV SOLN
INTRAVENOUS | Status: AC
  Filled 2013-05-19: qty 2

## 2013-05-19 NOTE — Progress Notes (Signed)
Subjective: No complaints.   Objective: Vital signs in last 24 hours: Temp:  [97.4 F (36.3 C)-98.8 F (37.1 C)] 98.3 F (36.8 C) (11/12 0410) Pulse Rate:  [63-88] 72 (11/12 0400) Resp:  [11-20] 20 (11/12 0400) BP: (136-173)/(46-81) 141/49 mmHg (11/12 0400) SpO2:  [94 %-100 %] 94 % (11/12 0400) Weight:  [119 lb 4.3 oz (54.1 kg)-130 lb 15.3 oz (59.4 kg)] 119 lb 4.3 oz (54.1 kg) (11/12 0410) Last BM Date: 05/16/13  Intake/Output from previous day: 11/11 0701 - 11/12 0700 In: 1208.9 [P.O.:570; I.V.:538.9; IV Piggyback:100] Out: 2500  Intake/Output this shift:    Medications Current Facility-Administered Medications  Medication Dose Route Frequency Provider Last Rate Last Dose  . 0.9 %  sodium chloride infusion   Intravenous Continuous Runell Gess, MD      . 0.9 %  sodium chloride infusion  1 mL/kg/hr Intravenous Continuous Abelino Derrick, PA-C 59 mL/hr at 05/18/13 2300 1 mL/kg/hr at 05/18/13 2300  . acetaminophen (TYLENOL) tablet 650 mg  650 mg Oral Q6H PRN Lonia Blood, MD   650 mg at 05/19/13 0119  . aspirin EC tablet 81 mg  81 mg Oral Daily Lennette Bihari, MD   81 mg at 05/17/13 1000  . atorvastatin (LIPITOR) tablet 40 mg  40 mg Oral Daily Dorothea Ogle, MD   40 mg at 05/17/13 1015  . cefTRIAXone (ROCEPHIN) 1 g in dextrose 5 % 50 mL IVPB  1 g Intravenous Q24H Lonia Blood, MD   1 g at 05/17/13 1501  . clopidogrel (PLAVIX) tablet 75 mg  75 mg Oral Q breakfast Lennette Bihari, MD   75 mg at 05/18/13 0803  . darbepoetin (ARANESP) injection 60 mcg  60 mcg Intravenous Q Wed-HD Maree Krabbe, MD      . diazepam (VALIUM) tablet 2 mg  2 mg Oral On Call Luke K Kilroy, PA-C      . diltiazem (CARDIZEM CD) 24 hr capsule 180 mg  180 mg Oral Daily Lennette Bihari, MD   180 mg at 05/17/13 1015  . feeding supplement (NEPRO CARB STEADY) liquid 237 mL  237 mL Oral PRN Maree Krabbe, MD      . fentaNYL (DURAGESIC - dosed mcg/hr) 12.5 mcg  12.5 mcg Transdermal Q72H Russella Dar, NP   12.5 mcg at 05/18/13 1200  . folic acid (FOLVITE) tablet 1 mg  1 mg Oral Daily Calvert Cantor, MD   1 mg at 05/17/13 1015  . heparin injection 1,000 Units  1,000 Units Dialysis PRN Maree Krabbe, MD      . hydrALAZINE (APRESOLINE) injection 10 mg  10 mg Intravenous UD Runell Gess, MD      . HYDROmorphone (DILAUDID) injection 0.5 mg  0.5 mg Intravenous Q2H PRN Chrystie Nose, MD   0.5 mg at 05/19/13 0005  . lidocaine (PF) (XYLOCAINE) 1 % injection 5 mL  5 mL Intradermal PRN Maree Krabbe, MD      . lidocaine-prilocaine (EMLA) cream 1 application  1 application Topical PRN Maree Krabbe, MD      . metoprolol tartrate (LOPRESSOR) tablet 25 mg  25 mg Oral BID Marykay Lex, MD   25 mg at 05/18/13 2241  . multivitamin (RENA-VIT) tablet 1 tablet  1 tablet Oral QHS Kerin Salen, PA-C   1 tablet at 05/18/13 2241  . ondansetron (ZOFRAN) tablet 4 mg  4 mg Oral Q6H PRN Dorothea Ogle,  MD       Or  . ondansetron (ZOFRAN) injection 4 mg  4 mg Intravenous Q6H PRN Dorothea Ogle, MD      . pentafluoroprop-tetrafluoroeth Peggye Pitt) aerosol 1 application  1 application Topical PRN Maree Krabbe, MD      . QUEtiapine (SEROQUEL) tablet 12.5 mg  12.5 mg Oral QHS PRN Maree Krabbe, MD      . sevelamer carbonate (RENVELA) tablet 2,400 mg  2,400 mg Oral TID WC Dorothea Ogle, MD   2,400 mg at 05/18/13 0803  . sorbitol 70 % solution 30 mL  30 mL Oral Daily PRN Calvert Cantor, MD      . traMADol Janean Sark) tablet 50 mg  50 mg Oral Q6H PRN Lonia Blood, MD   50 mg at 05/17/13 2124    PE: General appearance: alert, cooperative and no distress Lungs: clear to auscultation bilaterally Heart: regular rate and rhythm and 3/6 SM Extremities: no LEE Pulses: 2+ and symmetric Skin: warm and dry Neurologic: Grossly normal  Lab Results:   Recent Labs  05/17/13 0415 05/18/13 0520 05/19/13 0515  WBC 10.1 10.5 11.0*  HGB 9.9* 9.3* 10.0*  HCT 31.3* 29.1* 31.3*  PLT 293 314 321    BMET  Recent Labs  05/19/13 0515  NA 137  K 3.6  CL 97  CO2 29  GLUCOSE 83  BUN 15  CREATININE 4.87*  CALCIUM 8.6   PT/INR  Recent Labs  05/17/13 0415 05/18/13 0520 05/19/13 0515  LABPROT 22.0* 18.9* 16.5*  INR 1.99* 1.63* 1.37     Assessment/Plan  Principal Problem:   Encephalopathy, toxic Active Problems:   Anemia   HTN (hypertension)   ESRD (end stage renal disease) on dialysis   Moderate aortic stenosis   Long term (current) use of anticoagulants   Atrial fibrillation with RVR- PAF on adm 10/31   NSTEMI (non-ST elevated myocardial infarction)   UTI (urinary tract infection)   CAD- OM HSRA/DES 05/17/13- residual CFX dis   Fall at home prior to admission 10/31- Fx Lt arm   Dyslipidemia  Plan: Plan for HSRA and PCI to LCx with Dr. Tresa Endo today. INR is 1.37. HR and BP both stable. Resume warfarin post cath and HD per renal.     LOS: 12 days    Brittainy M. Delmer Islam 05/19/2013 7:53 AM   Patient seen and examined. Agree with assessment and plan. Pt is s/p NSTEM With peak troponin at 11.55 and initial cath revealed severe calcification of coronary arteries with >90% calcified near ostial and mid RCA stenosis and eccentrically calcified OM stenosis in large vessel with occlusion of mid AV grooove LCX. She is s/p complex PCI with HSRA/stenting of RCA, and has noted some sob. Last dialysis was yesterday. She is now referred for HSRA of LCX OM. BP currently 150/60. P 74.  Lennette Bihari, MD, [facility] 05/19/2013 8:45 AM

## 2013-05-19 NOTE — Progress Notes (Signed)
PT Cancellation Note  Patient Details Name: ELIANA LUETH MRN: [id number] DOB: 03/30/1928   Cancelled Treatment:    Reason Eval/Treat Not Completed: Medical issues which prohibited therapy.  Pt on bedrest after femoral sheath removal.  Will return tomorrow.  Thanks.    INGOLD,Brannan Cassedy 05/19/2013, 2:05 PM [facility] [phone] (pager)

## 2013-05-19 NOTE — Progress Notes (Addendum)
Per order, paged and spoke with K. Schorr of [facility] regarding pt's SBP (160s-170s). No orders received at this time. This RN instructed to page Cardiology if SBP reaches 190s-200s tonight. Will continue to monitor and assess.

## 2013-05-19 NOTE — Progress Notes (Signed)
[facility] Progress Note [place] TEAM 1 - Stepdown/ICU TEAM   [name] ZOX:[id number] DOB: [birth year] DOA: 05/07/2013 PCP: [name], MD  Admit HPI / Brief Narrative: 79 yo female with history of atrial fibrillation on Coumadin, HTN, HLD, ESRD on HD MWF, grade II diastolic dysfunction per last 2 D ECHO 11/2012. Fell at home 10/29 and was treated in th ER for a  supracondylar humerus fracture with mild displacement and angulation of distal fragment. Splint was applied by the EDP and appointment made for pt to FU with Dr. [name]. She returned on 10/30 for inadequate pain control. She returned on  10/31 after she was found to be more confused after she had taken 2 tablets of percocet for pain. She apparently took her pain meds after HD then family noted she was somewhat confused. Per ED physician, pt became more alert compared to mental status on arrival. Of note she had presented to the ER 10/14 for palpitations.  Assessment/Plan:  Acute Encephalopathy -Likely due to percocet being used for arm pain + UTI - B12 was low and folic acid low normal so replacing both  (see below) - MS appears to have returned to baseline at this time but is troubled by sundowning   ESRD on HD M/W/F -Nephrology following  NSTEMI -Cardiology following - cath completed 11/4  -11/7: temp pacer, atherectomy, PTCA and DES stent x 2 in RCA - complicated by hypotension that required Dopamine and Levophed  - LAD atherectomy planned for 11/12  Atrial fibrillation w/ RVR on chronic Coumadin -Rate controlled, INR varying   Warfarin induced coagulopathy INR climbing again, despite lack of warfarin - likely indicative of malnutrition/Vit K deficiency - will give low dose oral vitamin K x1 and follow -Cards to resume Coumadin once all invasive cardiac procedures completed  Grade II diastolic dysfunction Well compensated at present   Supracondylar humerus fracture with mild displacement and  angulation of distal fragment s/p fall 10/30 -inadequate pain control so added low dose Duragesic patch with marked improvement in pain control -Appreciate Dr. [name] assistance -XR revealed appropriate alignment of fx's so OK to continue splint and supportive care -will need to reschedule OP appointment for 1 week after dc -cont Miralax and Colace after dc  Anemia of chronic renal disease iron deficiency and low normal B 12 and folic acid -Hgb trend has been stable - no evidence of blood loss at present - was given SQ B12 and PO Folic acid - will need to consider OP study to determine if cannot absorb B12 - will likely dc on PO B12 until that can be accomplished-allow renal to replete iron  HTN -BP is reasonably controlled at this time  E coli UTI -cont Rocephin started 11/2 to complete 10 day course 11/12   HLD  Code Status: FULL Family Communication: spoke w/ pt and daughter at bedside  Disposition Plan: SDU   Consultants: Cardiology Nephrology  Orthopedics  Procedures: none  Antibiotics: Cipro 10/31 >> 11/02 Rocephin 11/02 >>   DVT prophylaxis: SCDs  HPI/Subjective: Pain still controlled - issues with sundowning and some daytime confusion -seems depressed but encouraged could possibly dc home in next 24 hours  Objective: Blood pressure 151/51, pulse 77, temperature 97.6 F (36.4 C), temperature source Oral, resp. rate 10, height 5\' 5"  (1.651 m), weight 119 lb 4.3 oz (54.1 kg), SpO2 97.00%.  Intake/Output Summary (Last 24 hours) at 05/19/13 1227 Last data filed at 05/19/13 0600  Gross per 24 hour  Intake  698.87 ml  Output   2500 ml  Net -1801.13 ml   Exam: General: No acute respiratory distress Lungs: Clear to auscultation bilaterally without wheezes or crackles Cardiovascular: Regular rate without murmur gallop or rub  Abdomen: Nontender, nondistended, soft, bowel sounds positive, no rebound, no ascites, no appreciable mass Extremities: No significant  cyanosis, clubbing, or edema bilateral lower extremities - L UE in splint   Data Reviewed: Basic Metabolic Panel:  Recent Labs Lab 05/14/13 0447 05/15/13 0500 05/15/13 0817 05/19/13 0515  NA 130* 130* 132* 137  K 4.6 5.8* 5.7* 3.6  CL 92* 96 96 97  CO2 28 20 21 29   GLUCOSE 91 73 80 83  BUN 34* 40* 41* 15  CREATININE 6.05* 6.85* 7.22* 4.87*  CALCIUM 8.8 8.6 8.7 8.6  PHOS 5.0*  --  6.9*  --    Liver Function Tests:  Recent Labs Lab 05/14/13 0447 05/15/13 0817  ALBUMIN 2.6* 2.6*   CBC:  Recent Labs Lab 05/15/13 0500 05/16/13 0445 05/17/13 0415 05/18/13 0520 05/19/13 0515  WBC 10.5 8.9 10.1 10.5 11.0*  HGB 9.7* 10.0* 9.9* 9.3* 10.0*  HCT 30.1* 30.8* 31.3* 29.1* 31.3*  MCV 90.7 91.7 92.1 92.4 92.6  PLT 295 282 293 314 321   Cardiac Enzymes: No results found for this basename: CKTOTAL, CKMB, CKMBINDEX, TROPONINI,  in the last 168 hours  No results found for this or any previous visit (from the past 240 hour(s)).   Studies:  Recent x-ray studies have been reviewed in detail by the Attending Physician  Scheduled Meds:  Scheduled Meds: . aspirin EC  81 mg Oral Daily  . atorvastatin  40 mg Oral Daily  . cefTRIAXone (ROCEPHIN)  IV  1 g Intravenous Q24H  . clopidogrel  75 mg Oral Q breakfast  . darbepoetin (ARANESP) injection - DIALYSIS  60 mcg Intravenous Q Wed-HD  . diltiazem  180 mg Oral Daily  . fentaNYL  12.5 mcg Transdermal Q72H  . folic acid  1 mg Oral Daily  . hydrALAZINE  10 mg Intravenous UD  . metoprolol tartrate  25 mg Oral BID  . multivitamin  1 tablet Oral QHS  . sevelamer carbonate  2,400 mg Oral TID WC    Time spent on care of this patient: 25 mins   ELLIS,[name], ANP [phone] 05/19/2013, 12:27 PM  I have personally examined the patient and reviewed the entire database. Agree with the above note and plan as outlined, any necessary changes made.  Gayle Collard [name] [facility] 05/19/2013, 1:41 PM Pager:  [phone]

## 2013-05-19 NOTE — Progress Notes (Signed)
Rt femoral venous and arterial sheath pulled. Pressure held for 35 minutes. Groin level 0. Pressure dressing applied. Right pedal pulses, faint but palpable.

## 2013-05-19 NOTE — Interval H&P Note (Signed)
Cath Lab Visit (complete for each Cath Lab visit)  Clinical Evaluation Leading to the Procedure:   ACS: yes  Non-ACS:    Anginal Classification: CCS III  Anti-ischemic medical therapy: Maximal Therapy (2 or more classes of medications)  Non-Invasive Test Results: No non-invasive testing performed  Prior CABG: No previous CABG      History and Physical Interval Note:  05/19/2013 9:32 AM  [name]  has presented today for surgery, with the diagnosis of cad  The various methods of treatment have been discussed with the patient and family. After consideration of risks, benefits and other options for treatment, the patient has consented to  Procedure(s): PERCUTANEOUS CORONARY ROTOBLATOR INTERVENTION (PCI-R) (N/A) as a surgical intervention .  The patient's history has been reviewed, patient examined, no change in status, stable for surgery.  I have reviewed the patient's chart and labs.  Questions were answered to the patient's satisfaction.     Carnesha Maravilla,Malecha A

## 2013-05-19 NOTE — H&P (View-Only) (Signed)
  Subjective: No complaints.   Objective: Vital signs in last 24 hours: Temp:  [97.4 F (36.3 C)-98.8 F (37.1 C)] 98.3 F (36.8 C) (11/12 0410) Pulse Rate:  [63-88] 72 (11/12 0400) Resp:  [11-20] 20 (11/12 0400) BP: (136-173)/(46-81) 141/49 mmHg (11/12 0400) SpO2:  [94 %-100 %] 94 % (11/12 0400) Weight:  [119 lb 4.3 oz (54.1 kg)-130 lb 15.3 oz (59.4 kg)] 119 lb 4.3 oz (54.1 kg) (11/12 0410) Last BM Date: 05/16/13  Intake/Output from previous day: 11/11 0701 - 11/12 0700 In: 1208.9 [P.O.:570; I.V.:538.9; IV Piggyback:100] Out: 2500  Intake/Output this shift:    Medications Current Facility-Administered Medications  Medication Dose Route Frequency Provider Last Rate Last Dose  . 0.9 %  sodium chloride infusion   Intravenous Continuous Jonathan J Berry, MD      . 0.9 %  sodium chloride infusion  1 mL/kg/hr Intravenous Continuous Luke K Kilroy, PA-C 59 mL/hr at 05/18/13 2300 1 mL/kg/hr at 05/18/13 2300  . acetaminophen (TYLENOL) tablet 650 mg  650 mg Oral Q6H PRN Jeffrey T McClung, MD   650 mg at 05/19/13 0119  . aspirin EC tablet 81 mg  81 mg Oral Daily Philbrick A Laurell Coalson, MD   81 mg at 05/17/13 1000  . atorvastatin (LIPITOR) tablet 40 mg  40 mg Oral Daily Iskra M Myers, MD   40 mg at 05/17/13 1015  . cefTRIAXone (ROCEPHIN) 1 g in dextrose 5 % 50 mL IVPB  1 g Intravenous Q24H Jeffrey T McClung, MD   1 g at 05/17/13 1501  . clopidogrel (PLAVIX) tablet 75 mg  75 mg Oral Q breakfast Styron A Adileny Delon, MD   75 mg at 05/18/13 0803  . darbepoetin (ARANESP) injection 60 mcg  60 mcg Intravenous Q Wed-HD Robert D Schertz, MD      . diazepam (VALIUM) tablet 2 mg  2 mg Oral On Call Luke K Kilroy, PA-C      . diltiazem (CARDIZEM CD) 24 hr capsule 180 mg  180 mg Oral Daily Hanway A Bahja Bence, MD   180 mg at 05/17/13 1015  . feeding supplement (NEPRO CARB STEADY) liquid 237 mL  237 mL Oral PRN Robert D Schertz, MD      . fentaNYL (DURAGESIC - dosed mcg/hr) 12.5 mcg  12.5 mcg Transdermal Q72H Allison L  Ellis, NP   12.5 mcg at 05/18/13 1200  . folic acid (FOLVITE) tablet 1 mg  1 mg Oral Daily Saima Rizwan, MD   1 mg at 05/17/13 1015  . heparin injection 1,000 Units  1,000 Units Dialysis PRN Robert D Schertz, MD      . hydrALAZINE (APRESOLINE) injection 10 mg  10 mg Intravenous UD Jonathan J Berry, MD      . HYDROmorphone (DILAUDID) injection 0.5 mg  0.5 mg Intravenous Q2H PRN Kenneth C. Hilty, MD   0.5 mg at 05/19/13 0005  . lidocaine (PF) (XYLOCAINE) 1 % injection 5 mL  5 mL Intradermal PRN Robert D Schertz, MD      . lidocaine-prilocaine (EMLA) cream 1 application  1 application Topical PRN Robert D Schertz, MD      . metoprolol tartrate (LOPRESSOR) tablet 25 mg  25 mg Oral BID David W Harding, MD   25 mg at 05/18/13 2241  . multivitamin (RENA-VIT) tablet 1 tablet  1 tablet Oral QHS Karen E Warren, PA-C   1 tablet at 05/18/13 2241  . ondansetron (ZOFRAN) tablet 4 mg  4 mg Oral Q6H PRN Iskra M Myers,   MD       Or  . ondansetron (ZOFRAN) injection 4 mg  4 mg Intravenous Q6H PRN Iskra M Myers, MD      . pentafluoroprop-tetrafluoroeth (GEBAUERS) aerosol 1 application  1 application Topical PRN Robert D Schertz, MD      . QUEtiapine (SEROQUEL) tablet 12.5 mg  12.5 mg Oral QHS PRN Robert D Schertz, MD      . sevelamer carbonate (RENVELA) tablet 2,400 mg  2,400 mg Oral TID WC Iskra M Myers, MD   2,400 mg at 05/18/13 0803  . sorbitol 70 % solution 30 mL  30 mL Oral Daily PRN Saima Rizwan, MD      . traMADol (ULTRAM) tablet 50 mg  50 mg Oral Q6H PRN Jeffrey T McClung, MD   50 mg at 05/17/13 2124    PE: General appearance: alert, cooperative and no distress Lungs: clear to auscultation bilaterally Heart: regular rate and rhythm and 3/6 SM Extremities: no LEE Pulses: 2+ and symmetric Skin: warm and dry Neurologic: Grossly normal  Lab Results:   Recent Labs  05/17/13 0415 05/18/13 0520 05/19/13 0515  WBC 10.1 10.5 11.0*  HGB 9.9* 9.3* 10.0*  HCT 31.3* 29.1* 31.3*  PLT 293 314 321    BMET  Recent Labs  05/19/13 0515  NA 137  K 3.6  CL 97  CO2 29  GLUCOSE 83  BUN 15  CREATININE 4.87*  CALCIUM 8.6   PT/INR  Recent Labs  05/17/13 0415 05/18/13 0520 05/19/13 0515  LABPROT 22.0* 18.9* 16.5*  INR 1.99* 1.63* 1.37     Assessment/Plan  Principal Problem:   Encephalopathy, toxic Active Problems:   Anemia   HTN (hypertension)   ESRD (end stage renal disease) on dialysis   Moderate aortic stenosis   Long term (current) use of anticoagulants   Atrial fibrillation with RVR- PAF on adm 10/31   NSTEMI (non-ST elevated myocardial infarction)   UTI (urinary tract infection)   CAD- OM HSRA/DES 05/17/13- residual CFX dis   Fall at home prior to admission 10/31- Fx Lt arm   Dyslipidemia  Plan: Plan for HSRA and PCI to LCx with Dr. Murle Hellstrom today. INR is 1.37. HR and BP both stable. Resume warfarin post cath and HD per renal.     LOS: 12 days    Brittainy M. Simmons, PA-C 05/19/2013 7:53 AM   Patient seen and examined. Agree with assessment and plan. Pt is s/p NSTEM With peak troponin at 11.55 and initial cath revealed severe calcification of coronary arteries with >90% calcified near ostial and mid RCA stenosis and eccentrically calcified OM stenosis in large vessel with occlusion of mid AV grooove LCX. She is s/p complex PCI with HSRA/stenting of RCA, and has noted some sob. Last dialysis was yesterday. She is now referred for HSRA of LCX OM. BP currently 150/60. P 74.  Berka A. Masayo Fera, MD, FACC 05/19/2013 8:45 AM   

## 2013-05-19 NOTE — CV Procedure (Signed)
[name] is a 79 y.o. female    [id number]  [id number] LOCATION:  FACILITY: [facility]  PHYSICIAN: Lennette Bihari, MD, [facility] 1927/12/22   DATE OF PROCEDURE:  05/19/2013      Complex PCI: Relook of RCA, IVUS of LCX/OM1, HSRA of LCX/OM1,cutting balloon, DES stent, and HSRA/cutting balloon of inferior branch of OM1 vessel     HISTORY:  [name] is an 79 year old African American female with a history of end-stage renal disease on dialysis, hypertension, hyperlipidemia, paroxysmal atrial fibrillation, and moderate aortic valve stenosis. She presented with a non-ST segment elevation myocardial infarction. She status post recent left arm fracture. On November 7 she underwent difficult but successful complex intervention to a severely calcified subtotal proximal and mid right coronary artery. She also had severe concomitant CAD of her circumflex vessel with total occlusion of the mid AV groove circumflex, severely calcified 90% ostial OM1 stenosis and 90% stenosis of the inferior branch of this marginal vessel. She now presents for complex intervention to the circumflex system.   PROCEDURE:  The patient was brought to the second floor [place] [facility] cath lab in the postabsorptive state. The right femoral artery was punctured anteriorly and a 6 French sheath was inserted. Initially, a 5 [place] diagnostic right catheter was inserted and selective angiography into the RCA confirmed widely patent RCA vessel without evidence for any restenosis with brisk TIMI-3 flow. Attention was then directed to the circumflex system. The patient had been on Plavix and received her 75 mg daily dose already this morning. Angiomax bolus plus infusion was administered. Selective angiography was performed utilizing a 6 [place] XB 3.5 guide. ACT was documented to be therapeutic. Prior to performing HSRA an intravascular ultrasound catheter was inserted over a Luge wire to see if in fact there was a  circumferential rim of calcification surrounding the ostial marginal stenosis and if not perhaps high-speed rotational atherectomy would not need to be performed for this lesion. The intravascular ultrasound was inserted but the catheter and was unable to cross the greater than 90% stenosis at the ostium of the inferior branch of this marginal vessel. However, ultrasound was performed in the OM1 proximally extending into the proximal circumflex which confirmed a 360 concentric large rim calcification. The decision was made to pursue high-speed rotational atherectomy. The initial luge wire was removed after a Roto floppy wire was able to be advanced down the inferior branch. A 1.25 mm burr was then platformed outside the body at 175,000 revolutions per minute. The burr was then advanced to the very proximal circumflex and high-speed rotational atherectomy was performed in the proximal circumflex extending into the ostium and proximal portion of this marginal vessel. There was significant resistance at the ostial stenosis in the inferior branch but ultimately the 1.25 burr was very carefully able to penetrate this highly calcified region rotational atherectomy. There was extreme care so as not to use the bur to distally beyond the ostium due to the vessel tortuosity in the proximal portion of this inferior branch. The 1.25 burr was then successfully dilated glided and removed. Since there was still significant calcium burden at the ostium of the OM1 extending into the circumflex proximally 1.5 burr was then inserted. Careful attention was made to only rotoblade from the circumflex into the ostial portion of the OM1 vessel and not to rotoblade further into the inferior branch. A 2.25 x 6  cutting balloon was then inserted and dilatations were made at the ostium of the OM1  vessel. There was significant difficulty in the cutting balloon crossing into the inferior branch of this vessel but slowly with additional  dilatations and with deep breaths taken by the patient bilateral was able to optimally dilate into the ostium of the inferior branch completely up to approximately 2.36 mm in size. The 2.25 cutting balloon was then removed. A 3.0x12 mm Promus Premier DES stent was then inserted into the distal aspect of the proximal circumflex extending into the proximal portion of this calcified OM vessel. The inferior branch was not stented. This was dilated x2 at 13 and 14 atmospheres. A 3.25x8 mm noncompliant Quantum balloon was then dilated x2 at 12 atmospheres in the stented segment resulting in an excellent angiographic result. At the completion of the procedure, the distal proximal circumflex extending into the proximal calcified circumflex marginal vessel was now 0% narrowed with brisk TIMI-3 flow in the 90% ostial stenosis in the inferior branch of this marginal vessel which had undergone high-speed rotational atherectomy with a 1.25 mm burr and cutting balloon arthrotomy with the 2.25 mm cutting balloon was then reduced to 0%. There was brisk TIMI-3 flow. There was no evidence for dissection. The patient tolerated the procedure well and returned to the coronary care unit chest pain-free with stable hemodynamics.  HEMODYNAMICS:   Central Aorta: Pressure vacillated from 80-110 mm systolically/60     ANGIOGRAPHY:  Selective angiography in the RCA revealed a large right coronary artery with widely patent stents both ostially extending proximally and in the region of the acute margin with no evidence for restenosis. There was brisk TIMI-3 flow in the distal RCA supplied for distal branches.  The left main coronary artery bifurcated into the LAD and circumflex system. The left circumflex vessel gave rise to a proximal bifurcating obtuse marginal branch that was severely calcified. The mid AV groove circumflex was 100% occluded and very calcified. There was at least 85% stenosis at the ostium of the OM1 extending to  the circumflex and in the inferior branch of this marginal vessel was at a greater than 90-95% focal stenosis. Following complex percutaneous intervention after intravascular ultrasound confirmed significant concentric calcification involving the ostium of the circumflex marginal branch and the fact that the inferior branch was so tight that the IVUS ultrasound could not penetrate following successful intervention with rotational atherectomy utilizing a 1.25 and 1.5 mm burr at the OM1 ostium followed by cutting balloon artherotomy, stenting with a 3.0x12 mm Primus Premier stent commencing in the proximal circumflex prior to the OM1 take off into the OM1 vessel being postdilated with a 3.25 noncompliant balloon, the distal circumflex and proximal OM1 was reportedly was reduced to 0%. The 95% ostial stenosis of the inferior branch of this OM1 vessel following high-speed rotational atherectomy with a 1.25 mm burr and cutting balloon artherotomy with a 2.25 mm Flextone cutting Balloon was reduced from  95% to 0% with brisk TIMI-3 flow. There was no evidence for dissection.balloon was reduced from greater than   IMPRESSION:  Widely patent RCA in this patient who underwent complex high-speed rotational atherectomy, cutting balloon artherotomy, PTCA, and stenting on 05/14/2013 without evidence for residual restenosis with brisk TIMI-3 flow.  Successful complex percutaneous intervention involving high-speed rotational atherectomy, cutting balloon artherotomy, DES stenting with a 3.0x12 mm Primus Premier stent extending from the distal proximal circumflex into the OM1 vessel the 85% stenosis reduced to 0%, and high-speed rotational atherectomy and cutting balloon arthrotomy of a 90 greater than 90% ostial inferior branch of this  marginal vessel with a stenosis reduced to 0%.    Lennette Bihari, MD, [facility] 05/19/2013 7:42 PM

## 2013-05-19 NOTE — Progress Notes (Signed)
Admit: 05/07/2013 LOS: 12  31F w/ NSTEMI, ESRD (MWF as outpt, THS in house)  Subjective:  HD  Yesterday, 2.5L UF To LHC today No c/o.     11/11 0701 - 11/12 0700 In: 1208.9 [P.O.:570; I.V.:538.9; IV Piggyback:100] Out: 2500   Filed Weights   05/18/13 1533 05/18/13 1839 05/19/13 0410  Weight: 59.4 kg (130 lb 15.3 oz) 55.5 kg (122 lb 5.7 oz) 54.1 kg (119 lb 4.3 oz)    Current meds: reviewed including darbepoeitin qWk, Renvela 2400 qAC Current Labs: reviewed   Outpt HD Orders Unit: [facility] Time: 3h Dialyzer:  EDW: 51.5kg K/Ca: 2K/2.5Ca Access: LUA AVF UF Proflie:  VDRA: 3.5 Hectoral qTx EPO: Epo 2200 2x/wk IV Fe:    Physical Exam:  Blood pressure 151/51, pulse 73, temperature 97.6 F (36.4 C), temperature source Oral, resp. rate 10, height 5\' 5"  (1.651 m), weight 54.1 kg (119 lb 4.3 oz), SpO2 97.00%. NAD, appears well IRIR CTAB, nl wob No rashes/lesions Nonfocal.  AAOx3  Assessment/Plan 1. ESRD, outpt MWF, inpt THS.  HD tomorrow.  Will now plan to get back onto MWF schedule coordinated with dc planning; K, BUN, HCO3 ok.   2. HTN/Vol: stable; above outpt edw,  Will work towards.  3. Anemia: Hb stable.  No changes.  Cont Aranesp.  High ferritin, TSat 28% recently 4. MBD: Ca at goal.  No changes.  Cont binder.  On VDRA 5. NSTEMI: per cardiology.  LHC tody    Sabra Heck MD 05/19/2013, 8:57 AM   Recent Labs Lab 05/14/13 0447 05/15/13 0500 05/15/13 0817 05/19/13 0515  NA 130* 130* 132* 137  K 4.6 5.8* 5.7* 3.6  CL 92* 96 96 97  CO2 28 20 21 29   GLUCOSE 91 73 80 83  BUN 34* 40* 41* 15  CREATININE 6.05* 6.85* 7.22* 4.87*  CALCIUM 8.8 8.6 8.7 8.6  PHOS 5.0*  --  6.9*  --     Recent Labs Lab 05/17/13 0415 05/18/13 0520 05/19/13 0515  WBC 10.1 10.5 11.0*  HGB 9.9* 9.3* 10.0*  HCT 31.3* 29.1* 31.3*  MCV 92.1 92.4 92.6  PLT 293 314 321    Current Facility-Administered Medications  Medication Dose Route Frequency Provider Last Rate  Last Dose  . 0.9 %  sodium chloride infusion   Intravenous Continuous Runell Gess, MD      . 0.9 %  sodium chloride infusion  1 mL/kg/hr Intravenous Continuous Abelino Derrick, PA-C 59 mL/hr at 05/18/13 2300 1 mL/kg/hr at 05/18/13 2300  . acetaminophen (TYLENOL) tablet 650 mg  650 mg Oral Q6H PRN Lonia Blood, MD   650 mg at 05/19/13 0119  . aspirin EC tablet 81 mg  81 mg Oral Daily Lennette Bihari, MD   81 mg at 05/17/13 1000  . atorvastatin (LIPITOR) tablet 40 mg  40 mg Oral Daily Dorothea Ogle, MD   40 mg at 05/17/13 1015  . cefTRIAXone (ROCEPHIN) 1 g in dextrose 5 % 50 mL IVPB  1 g Intravenous Q24H Lonia Blood, MD   1 g at 05/17/13 1501  . clopidogrel (PLAVIX) tablet 75 mg  75 mg Oral Q breakfast Lennette Bihari, MD   75 mg at 05/19/13 0830  . darbepoetin (ARANESP) injection 60 mcg  60 mcg Intravenous Q Wed-HD Maree Krabbe, MD      . diltiazem (CARDIZEM CD) 24 hr capsule 180 mg  180 mg Oral Daily Lennette Bihari, MD   180 mg  at 05/17/13 1015  . feeding supplement (NEPRO CARB STEADY) liquid 237 mL  237 mL Oral PRN Maree Krabbe, MD      . fentaNYL (DURAGESIC - dosed mcg/hr) 12.5 mcg  12.5 mcg Transdermal Q72H Russella Dar, NP   12.5 mcg at 05/18/13 1200  . folic acid (FOLVITE) tablet 1 mg  1 mg Oral Daily Calvert Cantor, MD   1 mg at 05/17/13 1015  . heparin injection 1,000 Units  1,000 Units Dialysis PRN Maree Krabbe, MD      . hydrALAZINE (APRESOLINE) injection 10 mg  10 mg Intravenous UD Runell Gess, MD      . HYDROmorphone (DILAUDID) injection 0.5 mg  0.5 mg Intravenous Q2H PRN Chrystie Nose, MD   0.5 mg at 05/19/13 0005  . lidocaine (PF) (XYLOCAINE) 1 % injection 5 mL  5 mL Intradermal PRN Maree Krabbe, MD      . lidocaine-prilocaine (EMLA) cream 1 application  1 application Topical PRN Maree Krabbe, MD      . metoprolol tartrate (LOPRESSOR) tablet 25 mg  25 mg Oral BID Marykay Lex, MD   25 mg at 05/18/13 2241  . multivitamin (RENA-VIT) tablet 1  tablet  1 tablet Oral QHS Kerin Salen, PA-C   1 tablet at 05/18/13 2241  . ondansetron (ZOFRAN) tablet 4 mg  4 mg Oral Q6H PRN Dorothea Ogle, MD       Or  . ondansetron [facility]) injection 4 mg  4 mg Intravenous Q6H PRN Dorothea Ogle, MD      . pentafluoroprop-tetrafluoroeth Peggye Pitt) aerosol 1 application  1 application Topical PRN Maree Krabbe, MD      . QUEtiapine (SEROQUEL) tablet 12.5 mg  12.5 mg Oral QHS PRN Maree Krabbe, MD      . sevelamer carbonate (RENVELA) tablet 2,400 mg  2,400 mg Oral TID WC Dorothea Ogle, MD   2,400 mg at 05/18/13 0803  . sorbitol 70 % solution 30 mL  30 mL Oral Daily PRN Calvert Cantor, MD      . traMADol (ULTRAM) tablet 50 mg  50 mg Oral Q6H PRN Lonia Blood, MD   50 mg at 05/19/13 0830

## 2013-05-20 DIAGNOSIS — Z7901 Long term (current) use of anticoagulants: Secondary | ICD-10-CM

## 2013-05-20 DIAGNOSIS — E785 Hyperlipidemia, unspecified: Secondary | ICD-10-CM

## 2013-05-20 LAB — BASIC METABOLIC PANEL
CO2: 25 mEq/L (ref 19–32)
Chloride: 96 mEq/L (ref 96–112)
Creatinine, Ser: 5.92 mg/dL — ABNORMAL HIGH (ref 0.50–1.10)
GFR calc Af Amer: 7 mL/min — ABNORMAL LOW (ref >90)
GFR calc non Af Amer: 6 mL/min — ABNORMAL LOW (ref >90)
Potassium: 3.8 mEq/L (ref 3.5–5.1)
Sodium: 133 mEq/L — ABNORMAL LOW (ref 135–145)

## 2013-05-20 LAB — CBC
HCT: 26.9 % — ABNORMAL LOW (ref 36.0–46.0)
Hemoglobin: 8.6 g/dL — ABNORMAL LOW (ref 12.0–15.0)
WBC: 10.9 10*3/uL — ABNORMAL HIGH (ref 4.0–10.5)

## 2013-05-20 LAB — PROTIME-INR
INR: 1.53 — ABNORMAL HIGH (ref 0.00–1.49)
Prothrombin Time: 18 seconds — ABNORMAL HIGH (ref 11.6–15.2)

## 2013-05-20 MED ORDER — WARFARIN - PHARMACIST DOSING INPATIENT: Freq: Every day | Status: DC

## 2013-05-20 MED ORDER — ENOXAPARIN SODIUM 60 MG/0.6ML ~~LOC~~ SOLN
50.0000 mg | SUBCUTANEOUS | Status: DC
  Administered 2013-05-20: 50 mg via SUBCUTANEOUS
  Filled 2013-05-20 (×2): qty 0.6

## 2013-05-20 MED ORDER — FENTANYL 12 MCG/HR TD PT72: 12.5000 ug | MEDICATED_PATCH | TRANSDERMAL | Status: DC

## 2013-05-20 MED ORDER — AMIODARONE HCL IN DEXTROSE 360-4.14 MG/200ML-% IV SOLN
30.0000 mg/h | INTRAVENOUS | Status: DC
  Administered 2013-05-21: 30 mg/h via INTRAVENOUS
  Filled 2013-05-20 (×3): qty 200

## 2013-05-20 MED ORDER — CLOPIDOGREL BISULFATE 75 MG PO TABS: 75.0000 mg | ORAL_TABLET | Freq: Every day | ORAL | Status: DC

## 2013-05-20 MED ORDER — ASPIRIN 81 MG PO TBEC: 81.0000 mg | DELAYED_RELEASE_TABLET | Freq: Every day | ORAL | Status: DC

## 2013-05-20 MED ORDER — TRAMADOL HCL 50 MG PO TABS: 50.0000 mg | ORAL_TABLET | Freq: Four times a day (QID) | ORAL | Status: DC | PRN

## 2013-05-20 MED ORDER — AMIODARONE HCL IN DEXTROSE 360-4.14 MG/200ML-% IV SOLN
INTRAVENOUS | Status: AC
  Administered 2013-05-20: 59.94 mg/h via INTRAVENOUS
  Filled 2013-05-20: qty 200

## 2013-05-20 MED ORDER — METOPROLOL TARTRATE 25 MG PO TABS
25.0000 mg | ORAL_TABLET | Freq: Once | ORAL | Status: AC
  Administered 2013-05-20: 25 mg via ORAL

## 2013-05-20 MED ORDER — AMIODARONE LOAD VIA INFUSION
150.0000 mg | Freq: Once | INTRAVENOUS | Status: AC
  Administered 2013-05-20: 150 mg via INTRAVENOUS

## 2013-05-20 MED ORDER — FOLIC ACID 1 MG PO TABS: 1.0000 mg | ORAL_TABLET | Freq: Every day | ORAL | Status: DC

## 2013-05-20 MED ORDER — DARBEPOETIN ALFA-POLYSORBATE 60 MCG/0.3ML IJ SOLN
INTRAMUSCULAR | Status: AC
  Filled 2013-05-20: qty 0.3

## 2013-05-20 MED ORDER — DILTIAZEM HCL ER COATED BEADS 180 MG PO CP24: 180.0000 mg | ORAL_CAPSULE | Freq: Every day | ORAL | Status: DC

## 2013-05-20 MED ORDER — WARFARIN SODIUM 4 MG PO TABS
8.0000 mg | ORAL_TABLET | Freq: Once | ORAL | Status: AC
  Administered 2013-05-20: 8 mg via ORAL
  Filled 2013-05-20 (×2): qty 2

## 2013-05-20 MED ORDER — METOPROLOL TARTRATE 25 MG PO TABS: 25.0000 mg | ORAL_TABLET | Freq: Two times a day (BID) | ORAL | Status: DC

## 2013-05-20 MED ORDER — ATORVASTATIN CALCIUM 40 MG PO TABS
40.0000 mg | ORAL_TABLET | Freq: Every day | ORAL | Status: DC
  Administered 2013-05-20 – 2013-05-21 (×2): 40 mg via ORAL
  Filled 2013-05-20 (×3): qty 1

## 2013-05-20 MED ORDER — DOXERCALCIFEROL 4 MCG/2ML IV SOLN
INTRAVENOUS | Status: AC
  Administered 2013-05-20: 3.5 ug via INTRAVENOUS
  Filled 2013-05-20: qty 2

## 2013-05-20 MED ORDER — METOPROLOL TARTRATE 25 MG PO TABS
25.0000 mg | ORAL_TABLET | ORAL | Status: DC
  Administered 2013-05-20: 25 mg via ORAL
  Filled 2013-05-20 (×3): qty 1

## 2013-05-20 MED ORDER — AMIODARONE HCL IN DEXTROSE 360-4.14 MG/200ML-% IV SOLN
60.0000 mg/h | INTRAVENOUS | Status: AC
  Administered 2013-05-20: 60 mg/h via INTRAVENOUS
  Administered 2013-05-20: 59.94 mg/h via INTRAVENOUS
  Filled 2013-05-20 (×2): qty 200

## 2013-05-20 MED FILL — Sodium Chloride IV Soln 0.9%: INTRAVENOUS | Qty: 50 | Status: AC

## 2013-05-20 NOTE — Progress Notes (Signed)
Subjective:  Up in chair. She has ambulated with PT.  Objective:  Vital Signs in the last 24 hours: Temp:  [97.5 F (36.4 C)-98.7 F (37.1 C)] 98.1 F (36.7 C) (11/13 0756) Pulse Rate:  [69-79] 71 (11/13 0329) Resp:  [9-19] 12 (11/13 0329) BP: (117-162)/(40-61) 139/49 mmHg (11/13 0300) SpO2:  [94 %-100 %] 99 % (11/13 0329)  Intake/Output from previous day:  Intake/Output Summary (Last 24 hours) at 05/20/13 0819 Last data filed at 05/20/13 0600  Gross per 24 hour  Intake 1300.33 ml  Output      0 ml  Net 1300.33 ml    Physical Exam: General appearance: alert, cooperative and no distress Lungs: clear to auscultation bilaterally Heart: regular rate and rhythm and 2/6 systolic murmur Extremities: Rt groin without hematoma   Rate: 76  Rhythm: normal sinus rhythm  Lab Results:  Recent Labs  05/19/13 0515 05/20/13 0425  WBC 11.0* 10.9*  HGB 10.0* 8.6*  PLT 321 285    Recent Labs  05/19/13 0515 05/20/13 0425  NA 137 133*  K 3.6 3.8  CL 97 96  CO2 29 25  GLUCOSE 83 76  BUN 15 20  CREATININE 4.87* 5.92*   No results found for this basename: TROPONINI, CK, MB,  in the last 72 hours  Recent Labs  05/20/13 0425  INR 1.53*    Imaging: Imaging results have been reviewed  Cardiac Studies:  Assessment/Plan:   Principal Problem:   Encephalopathy, - admitted with MS changes 05/07/13 Active Problems:   NSTEMI 05/07/13   CAD- RCA HSRA/DES 05/15/13-  CFX/OM1 DES 05/19/13   ESRD (end stage renal disease) on dialysis   Moderate aortic stenosis   Atrial fibrillation with RVR- PAF on adm 10/31   Anemia   HTN (hypertension)   Long term (current) use of anticoagulants   UTI (urinary tract infection)   Fall at home prior to admission 10/31- Fx Lt arm   Dyslipidemia    PLAN: Will discuss with MD. She was on Coumadin prior to admission for PAF and had PAF on admission. She is in NSR now. ? Resume Coumadin or consider another agent- ? Eliquis. She is on ASA  81mg  and Plavix now.  Per Nephrology, OK for short dialysis today, possible discharge later (pending anticoagulation issue).  We will arrange early TCM follow up. She will need ortho follow up as well.   Corine Shelter PA-C Beeper [phone] 05/20/2013, 8:19 AM   I have seen & examined the patient this AM, reviewed the chart.  I agree with Mr. Wynelle Link findings, exam & recommendations.  She tolerated her staged procedures well, but has been essentially bed bound for ~2 weeks.  She admittedly feels quite weak & unsteady walking.  PT noted that she had to stop several times even walking a short distance.  She was previously on warfarin for Afib prophylaxis & current ACC/AHA recommendation would favor restarting warfarin.  -- with ASA & Plavix x 1 month. Then d/c ASA.  Would start warfarin today (would not need to bridge).  I do not think that she is quite stable enough for d/c today -- would prefer to allow for PT /CRH to work with her today & tomorrow with possible d/c tomorrow after HD if stable.  She is fine for transfer out of TCU to [place] today.  BP is elevated - will up-titrate BB dose  Is on statin  HARDING,DAVID W, [name], M.S. [facility] [address] [place], [place]  [address]  [phone] Pager # [phone] 05/20/2013 9:26 AM

## 2013-05-20 NOTE — Progress Notes (Signed)
Physical Therapy Treatment Patient Details Name: [name] MRN: [id number] DOB: 18-Jan-1928 Today's Date: 05/20/2013 Time: 4098-1191 PT Time Calculation (min): 29 min  PT Assessment / Plan / Recommendation  History of Present Illness 79 yo female with history of atrial fibrillation on Coumadin, HTN, HLD, ESRD on HD MWF, grade II diastolic dysfunction.  Patient with recent fall at home with Lt humerus fx with splint/sling.  Patient now admitted on 10/31 with confusion, A-fib and NSTEMI.  Patient s/p stents on 11/7.  Plan additional stenting on Wed 11/12.   PT Comments   Pt progressing with mobility but fatigued with gait. Pt will continue to need training for use of DME with gait and recommend continued use of quad cane with all gait. Pt encouraged to perform HEP after recovery period.    Follow Up Recommendations  Home health PT;Supervision/Assistance - 24 hour     Does the patient have the potential to tolerate intense rehabilitation     Barriers to Discharge        Equipment Recommendations       Recommendations for Other Services    Frequency     Progress towards PT Goals Progress towards PT goals: Progressing toward goals  Plan Current plan remains appropriate    Precautions / Restrictions Precautions Precautions: Fall Precaution Comments: LUE in splint/sling Required Braces or Orthoses: Sling Restrictions Weight Bearing Restrictions: No Other Position/Activity Restrictions: LUE functionally NWB with splint/sling   Pertinent Vitals/Pain No pain HR 78-102 sats 95-98% on RA 147/61   Mobility  Bed Mobility Bed Mobility: Supine to Sit;Sitting - Scoot to Edge of Bed Supine to Sit: 4: Min assist;HOB flat Sitting - Scoot to [place] of Bed: 3: Mod assist Details for Bed Mobility Assistance: cueing for sequence as pt initially reaching out with RUE to have PT assist to sitting and cues to push into bed with assist to fully elevate trunk. Assist of pad and reciprocal  scooting to scoot to EOB Transfers Transfers: Sit to Stand;Stand to Sit Sit to Stand: 4: Min assist;From bed Stand to Sit: 4: Min assist;To chair/3-in-1;With armrests Details for Transfer Assistance: cueing for hand placement, safety and sequence to push with RUE and reach for armrest, assist to control descent with fatigue Ambulation/Gait Ambulation/Gait Assistance: 4: Min guard Ambulation Distance (Feet): 200 Feet Assistive device: Small based quad cane Ambulation/Gait Assistance Details: cueing throughout for placement and sequence of quad cane use. Increased unsteadiness with fatigue end of session and chair pulled to pt. cues for posture and standing rest as needed. Standing rest x 4 Gait Pattern: Step-through pattern;Decreased stride length;Trunk flexed Gait velocity: very slow General Gait Details: HR 78-102 with gait Stairs: No    Exercises     PT Diagnosis:    PT Problem List:   PT Treatment Interventions:     PT Goals (current goals can now be found in the care plan section)    Visit Information  Last PT Received On: 05/20/13 Assistance Needed: +1 History of Present Illness: 79 yo female with history of atrial fibrillation on Coumadin, HTN, HLD, ESRD on HD MWF, grade II diastolic dysfunction.  Patient with recent fall at home with Lt humerus fx with splint/sling.  Patient now admitted on 10/31 with confusion, A-fib and NSTEMI.  Patient s/p stents on 11/7.  Plan additional stenting on Wed 11/12.    Subjective Data      Cognition  Cognition Arousal/Alertness: Awake/alert Behavior During Therapy: WFL for tasks assessed/performed Overall Cognitive Status: Within Functional Limits  for tasks assessed    Balance     End of Session PT - End of Session Equipment Utilized During Treatment: Gait belt;Other (comment) (sling) Activity Tolerance: Patient limited by fatigue Patient left: in chair;with call bell/phone within reach;with family/visitor present Nurse  Communication: Mobility status   GP     [name] 05/20/2013, 9:06 AM [name], PT [phone]

## 2013-05-20 NOTE — Progress Notes (Signed)
ANTICOAGULATION CONSULT NOTE - Initial Consult  Pharmacy Consult for Warfarin / Enoxaparin Indication: atrial fibrillation  No Known Allergies  Patient Measurements: Height: 5\' 5"  (165.1 cm) Weight: 119 lb 4.3 oz (54.1 kg) IBW/kg (Calculated) : 57   Vital Signs: Temp: 98.1 F (36.7 C) (11/13 0756) Temp src: Oral (11/13 0756) BP: 164/57 mmHg (11/13 0800) Pulse Rate: 79 (11/13 0800)  Labs:  Recent Labs  05/17/13 2135  05/18/13 0520 05/19/13 0515 05/20/13 0425  HGB  --   < > 9.3* 10.0* 8.6*  HCT  --   --  29.1* 31.3* 26.9*  PLT  --   --  314 321 285  APTT 38*  --   --   --   --   LABPROT  --   --  18.9* 16.5* 18.0*  INR  --   --  1.63* 1.37 1.53*  CREATININE  --   --   --  4.87* 5.92*  < > = values in this interval not displayed.  Estimated Creatinine Clearance: 6.3 ml/min (by C-G formula based on Cr of 5.92).   Medical History: Past Medical History  Diagnosis Date  . Anemia 03/06/2011  . Hypertension   . Coronary artery disease   . Renal disorder   . Aortic stenosis, mild   . A-fib   . Acute CHF       Assessment: 84yof with ESRD and Afib admitted with sedation after pain meds, hypotension and elevated Tp.  She had elevated INR on admit 4.57 which was reversed with vit k and held for cath. Cath 11/12 with DES to LCx.  Started on ASA81 and Plavix.    INR now 1.53, CBC low will follow, no bleeding noted.  Coumadin to be restarted today with enoxaparin bridge 69m/kg = 50mg  q24 longer frequency for poor renal fx.    Goal of Therapy:  INR 2-3 Monitor platelets by anticoagulation protocol: Yes   Plan:   Lovenox 1mg /kg = 50mg  q24 Warfarin 8mg  x1 Daily Protime  Leota Sauers Pharm.D. CPP, BCPS Clinical Pharmacist [phone] 05/20/2013 9:37 AM

## 2013-05-20 NOTE — Care Management Note (Addendum)
    Page 1 of 2   05/22/2013     12:54:07 PM   CARE MANAGEMENT NOTE 05/22/2013  Patient:  [name], [name]   Account Number:  [id number]  Date Initiated:  05/10/2013  Documentation initiated by:  [name]  Subjective/Objective Assessment:   adm w ams, mi, uti     Action/Plan:   lives w husband, pcp dr [name] [place]   Anticipated DC Date:  05/21/2013   Anticipated DC Plan:  HOME W HOME HEALTH SERVICES      DC Planning Services  CM consult      [facility] Choice  HOME HEALTH   Choice offered to / List presented to:  [name]        HH arranged  HH-1 RN  HH-2 PT  HH-3 OT      [facility] agency  [facility]   Status of service:   Medicare Important Message given?   (If response is "NO", the following Medicare IM given date fields will be blank) Date Medicare IM given:   Date Additional Medicare IM given:    Discharge Disposition:  HOME W HOME HEALTH SERVICES  Per UR Regulation:  Reviewed for med. necessity/level of care/duration of stay  If discussed at Long Length of Stay Meetings, dates discussed:   05/13/2013  05/18/2013  05/20/2013    Comments:   05-22-13 1252 Brittany Bamberger, RN,BSN [phone] CM faxed order to [facility] to include OT services. SOC to begin within 24-48 hours post d/c.  11/13 1046a Brittany dowell rn,bsn spoke w pt and da. pt's husb getting rehab at present. pt was rec for hhpt by phy ther. spoke w pt and no pref to hhc agencies. she lives in [place] and has [facility] ins. ref to donna w [facility] for hhrn and hhpt. will need final orders at disch. tent ref made.

## 2013-05-20 NOTE — Progress Notes (Signed)
Admit: 05/07/2013 LOS: 13  81F w/ NSTEMI, ESRD (MWF as outpt, THS in house)  Subjective:  Successful PCI yesterday NAEON   11/12 0701 - 11/13 0700 In: 1300.3 [P.O.:160; I.V.:1090.3; IV Piggyback:50] Out: -   Filed Weights   05/18/13 1533 05/18/13 1839 05/19/13 0410  Weight: 59.4 kg (130 lb 15.3 oz) 55.5 kg (122 lb 5.7 oz) 54.1 kg (119 lb 4.3 oz)    Current meds: reviewed including darbepoeitin qWk, Renvela 2400 qAC Current Labs: reviewed   Outpt HD Orders Unit: [facility] Time: 3h Dialyzer:  EDW: 51.5kg K/Ca: 2K/2.5Ca Access: LUA AVF UF Proflie:  VDRA: 3.5 Hectoral qTx EPO: Epo 2200 2x/wk IV Fe:    Physical Exam:  Blood pressure 139/49, pulse 71, temperature 98.1 F (36.7 C), temperature source Oral, resp. rate 12, height 5\' 5"  (1.651 m), weight 54.1 kg (119 lb 4.3 oz), SpO2 99.00%. NAD, appears well IRIR CTAB, nl wob No rashes/lesions Nonfocal.  AAOx3  Assessment/Plan 1. ESRD, outpt MWF, inpt THS.  Tentative plan is for DC today.  Abbreviated HD today before full treatment as outpt tomorrow. 2. HTN/Vol: stable; above outpt edw,  Will work towards. UF 2L today.   3. Anemia: No changes.  Cont Aranesp.  High ferritin, TSat 28% recently 4. MBD: Ca at goal.  No changes.  Cont binder.  On VDRA 5. NSTEMI: per cardiology.  S/p HSRA of L system yesterday.  Sabra Heck MD 05/20/2013, 8:12 AM   Recent Labs Lab 05/14/13 0447  05/15/13 0817 05/19/13 0515 05/20/13 0425  NA 130*  < > 132* 137 133*  K 4.6  < > 5.7* 3.6 3.8  CL 92*  < > 96 97 96  CO2 28  < > 21 29 25   GLUCOSE 91  < > 80 83 76  BUN 34*  < > 41* 15 20  CREATININE 6.05*  < > 7.22* 4.87* 5.92*  CALCIUM 8.8  < > 8.7 8.6 8.3*  PHOS 5.0*  --  6.9*  --   --   < > = values in this interval not displayed.  Recent Labs Lab 05/18/13 0520 05/19/13 0515 05/20/13 0425  WBC 10.5 11.0* 10.9*  HGB 9.3* 10.0* 8.6*  HCT 29.1* 31.3* 26.9*  MCV 92.4 92.6 93.7  PLT 314 321 285    Current  Facility-Administered Medications  Medication Dose Route Frequency Provider Last Rate Last Dose  . 0.9 %  sodium chloride infusion   Intravenous Continuous Runell Gess, MD      . 0.9 %  sodium chloride infusion  100 mL Intravenous PRN Arita Miss, MD      . 0.9 %  sodium chloride infusion  100 mL Intravenous PRN Arita Miss, MD      . 0.9 %  sodium chloride infusion   Intravenous Continuous Lennette Bihari, MD 50 mL/hr at 05/19/13 1956    . acetaminophen (TYLENOL) tablet 650 mg  650 mg Oral Q6H PRN Lonia Blood, MD   650 mg at 05/19/13 2028  . acetaminophen (TYLENOL) tablet 650 mg  650 mg Oral Q4H PRN Lennette Bihari, MD      . aspirin EC tablet 81 mg  81 mg Oral Daily Lennette Bihari, MD   81 mg at 05/17/13 1000  . atorvastatin (LIPITOR) tablet 40 mg  40 mg Oral Daily Dorothea Ogle, MD   40 mg at 05/17/13 1015  . clopidogrel (PLAVIX) tablet 75 mg  75 mg Oral Q breakfast  Lennette Bihari, MD   75 mg at 05/19/13 0830  . darbepoetin (ARANESP) injection 60 mcg  60 mcg Intravenous Q Wed-HD Maree Krabbe, MD      . diltiazem (CARDIZEM CD) 24 hr capsule 180 mg  180 mg Oral Daily Lennette Bihari, MD   180 mg at 05/17/13 1015  . doxercalciferol (HECTOROL) injection 3.5 mcg  3.5 mcg Intravenous Q T,Th,Sa-HD Arita Miss, MD      . feeding supplement (NEPRO CARB STEADY) liquid 237 mL  237 mL Oral PRN Maree Krabbe, MD      . fentaNYL (DURAGESIC - dosed mcg/hr) 12.5 mcg  12.5 mcg Transdermal Q72H Russella Dar, NP   12.5 mcg at 05/18/13 1200  . folic acid (FOLVITE) tablet 1 mg  1 mg Oral Daily Calvert Cantor, MD   1 mg at 05/17/13 1015  . heparin injection 1,000 Units  1,000 Units Dialysis PRN Maree Krabbe, MD      . hydrALAZINE (APRESOLINE) injection 10 mg  10 mg Intravenous UD Runell Gess, MD      . HYDROmorphone (DILAUDID) injection 0.5 mg  0.5 mg Intravenous Q2H PRN Chrystie Nose, MD   0.5 mg at 05/19/13 1844  . lidocaine (PF) (XYLOCAINE) 1 % injection 5 mL  5 mL  Intradermal PRN Maree Krabbe, MD      . lidocaine-prilocaine (EMLA) cream 1 application  1 application Topical PRN Maree Krabbe, MD      . metoprolol tartrate (LOPRESSOR) tablet 25 mg  25 mg Oral BID Marykay Lex, MD   25 mg at 05/19/13 2121  . multivitamin (RENA-VIT) tablet 1 tablet  1 tablet Oral QHS Kerin Salen, PA-C   1 tablet at 05/19/13 2121  . ondansetron (ZOFRAN) tablet 4 mg  4 mg Oral Q6H PRN Dorothea Ogle, MD       Or  . ondansetron [facility]) injection 4 mg  4 mg Intravenous Q6H PRN Dorothea Ogle, MD      . ondansetron [facility]) injection 4 mg  4 mg Intravenous Q6H PRN Lennette Bihari, MD      . pentafluoroprop-tetrafluoroeth (GEBAUERS) aerosol 1 application  1 application Topical PRN Maree Krabbe, MD      . QUEtiapine (SEROQUEL) tablet 12.5 mg  12.5 mg Oral QHS PRN Maree Krabbe, MD      . sevelamer carbonate (RENVELA) tablet 2,400 mg  2,400 mg Oral TID WC Dorothea Ogle, MD   2,400 mg at 05/18/13 0803  . sorbitol 70 % solution 30 mL  30 mL Oral Daily PRN Calvert Cantor, MD      . traMADol (ULTRAM) tablet 50 mg  50 mg Oral Q6H PRN Lonia Blood, MD   50 mg at 05/19/13 0830

## 2013-05-20 NOTE — Progress Notes (Signed)
Agree with plan.  HARDING,DAVID W, MD  

## 2013-05-20 NOTE — Progress Notes (Signed)
Pt went into AF this pm with increased VR. No change with an extra Lopressor 25 mg po. She is already on Diltiazem. Discussed with Dr Herbie Baltimore, will add IV Amiodarone with slow bolus.   Corine Shelter PA-C 05/20/2013 5:41 PM

## 2013-05-20 NOTE — Progress Notes (Signed)
[facility] Progress Note [place] TEAM 1 - Stepdown/ICU TEAM   [name] ZOX:[id number] DOB: 02-04-1928 DOA: 05/07/2013 PCP: [name], MD  Admit HPI / Brief Narrative: 79 yo female with history of atrial fibrillation on Coumadin, HTN, HLD, ESRD on HD MWF, grade II diastolic dysfunction per last 2 D ECHO 11/2012. Fell at home 10/29 and was treated in th ER for a  supracondylar humerus fracture with mild displacement and angulation of distal fragment. Splint was applied by the EDP and appointment made for pt to FU with Dr. [name]. She returned on 10/30 for inadequate pain control. She returned on  10/31 after she was found to be more confused after she had taken 2 tablets of percocet for pain. She apparently took her pain meds after HD then family noted she was somewhat confused. Per ED physician, pt became more alert compared to mental status on arrival. Of note she had presented to the ER 10/14 for palpitations.  Assessment/Plan:  Acute Encephalopathy -Likely due to percocet being used for arm pain + UTI - B12 was low and folic acid low normal so replacing both  (see below) - MS appears to have returned to baseline at this time but is troubled by sundowning   ESRD on HD M/W/F -Nephrology following  NSTEMI -Cardiology following - cath completed 11/4  -11/7: temp pacer, atherectomy, PTCA and DES stent x 2 in RCA - complicated by hypotension that required Dopamine and Levophed  - LAD atherectomy planned for 11/12  Atrial fibrillation w/ RVR on chronic Coumadin -has gone back in to A-fib as of this afternoon- cardiology is managing  -coumadin resumed post cath 11/12 but INR low  Warfarin induced coagulopathy INR climbing again, despite lack of warfarin - likely indicative of malnutrition/Vit K deficiency - was given low dose oral vitamin K x1 and follow  Grade II diastolic dysfunction Well compensated at present   Supracondylar humerus fracture with mild  displacement and angulation of distal fragment s/p fall 10/30 -inadequate pain control so added low dose Duragesic patch with marked improvement in pain control -Appreciate Dr. [name] assistance -XR revealed appropriate alignment of fx's so OK to continue splint and supportive care -will need to reschedule OP appointment for 1 week after dc -cont Miralax and Colace after dc -CIR eval completed and rec HH PT  Anemia of chronic renal disease iron deficiency and low normal B 12 and folic acid -Hgb trend has been stable - no evidence of blood loss at present - was given SQ B12 and PO Folic acid - will need to consider OP study to determine if cannot absorb B12 - will likely dc on PO B12 until that can be accomplished-allow renal to replete iron  HTN -BP is reasonably controlled at this time  E coli UTI -cont Rocephin started 11/2 to complete 10 day course 11/12   HLD  Code Status: FULL Family Communication: spoke w/ pt and daughter at bedside  Disposition Plan: cancel transfer to tele due to recurrent a-fib with RVR  Consultants: Cardiology Nephrology  Orthopedics  Procedures: Left heart cath 11/04  Complex PCI with temporary pacemaker and high speed rotational atherectomy, PTCA, cutting baloon and DES x 2  11/08  Complex PCI: relook of RCA, IVUS, HSRA of Lcx/OM1, cutting balloon, DES and HSRA/cutting balloon of inf.branch of OM1 vessel   Antibiotics: Cipro 10/31 >> 11/02 Rocephin 11/02 >> 11/12  DVT prophylaxis: SCDs  HPI/Subjective: Pain controlled- eager to dc home  Objective: Blood pressure  123/56, pulse 77, temperature 97.8 F (36.6 C), temperature source Oral, resp. rate 21, height 5\' 5"  (1.651 m), weight 125 lb (56.7 kg), SpO2 100.00%.  Intake/Output Summary (Last 24 hours) at 05/20/13 1545 Last data filed at 05/20/13 1357  Gross per 24 hour  Intake   1070 ml  Output    896 ml  Net    174 ml   Exam: General: No acute respiratory distress Lungs: Clear to  auscultation bilaterally without wheezes or crackles Cardiovascular: Regular rate without murmur gallop or rub  Abdomen: Nontender, nondistended, soft, bowel sounds positive, no rebound, no ascites, no appreciable mass Extremities: No significant cyanosis, clubbing, or edema bilateral lower extremities - L UE in splint   Data Reviewed: Basic Metabolic Panel:  Recent Labs Lab 05/14/13 0447 05/15/13 0500 05/15/13 0817 05/19/13 0515 05/20/13 0425  NA 130* 130* 132* 137 133*  K 4.6 5.8* 5.7* 3.6 3.8  CL 92* 96 96 97 96  CO2 28 20 21 29 25   GLUCOSE 91 73 80 83 76  BUN 34* 40* 41* 15 20  CREATININE 6.05* 6.85* 7.22* 4.87* 5.92*  CALCIUM 8.8 8.6 8.7 8.6 8.3*  PHOS 5.0*  --  6.9*  --   --    Liver Function Tests:  Recent Labs Lab 05/14/13 0447 05/15/13 0817  ALBUMIN 2.6* 2.6*   CBC:  Recent Labs Lab 05/16/13 0445 05/17/13 0415 05/18/13 0520 05/19/13 0515 05/20/13 0425  WBC 8.9 10.1 10.5 11.0* 10.9*  HGB 10.0* 9.9* 9.3* 10.0* 8.6*  HCT 30.8* 31.3* 29.1* 31.3* 26.9*  MCV 91.7 92.1 92.4 92.6 93.7  PLT 282 293 314 321 285   Cardiac Enzymes: No results found for this basename: CKTOTAL, CKMB, CKMBINDEX, TROPONINI,  in the last 168 hours  No results found for this or any previous visit (from the past 240 hour(s)).   Studies:  Recent x-ray studies have been reviewed in detail by the Attending Physician  Scheduled Meds:  Scheduled Meds: . aspirin EC  81 mg Oral Daily  . atorvastatin  40 mg Oral q1800  . clopidogrel  75 mg Oral Q breakfast  . darbepoetin (ARANESP) injection - DIALYSIS  60 mcg Intravenous Q Wed-HD  . diltiazem  180 mg Oral Daily  . doxercalciferol  3.5 mcg Intravenous Q T,Th,Sa-HD  . enoxaparin (LOVENOX) injection  50 mg Subcutaneous Q24H  . fentaNYL  12.5 mcg Transdermal Q72H  . folic acid  1 mg Oral Daily  . hydrALAZINE  10 mg Intravenous UD  . metoprolol tartrate  25 mg Oral BID  . multivitamin  1 tablet Oral QHS  . sevelamer carbonate  2,400  mg Oral TID WC  . warfarin  8 mg Oral ONCE-1800  . Warfarin - Pharmacist Dosing Inpatient   Does not apply q1800    Time spent on care of this patient: 25 mins   ELLIS,[name], ANP [phone] 05/20/2013, 3:45 PM   I have examined the patient, reviewed the chart and modified the above note which I agree with.   Zunaira Lamy,MD [phone] 05/20/2013, 5:11 PM

## 2013-05-20 NOTE — Progress Notes (Signed)
Thank you for consult on [name]. She is doing well with therapies and is too high level for CIR. HH therapies recommended for follow up and we concurr. Will defer CIR consult for now.

## 2013-05-20 NOTE — Procedures (Signed)
I was present at this dialysis session. I have reviewed the session itself and made appropriate changes.   Pt now to d/c tomorrow.  With today tx will have another tx tomorrow and be back on schedule.   Sabra Heck  MD 05/20/2013, 11:56 AM

## 2013-05-20 NOTE — Progress Notes (Signed)
Pt. Noted to be in Afib RVR. Cardiology called. Orders received. Will continue t0 monitor pt.

## 2013-05-21 LAB — CBC
Hemoglobin: 8.7 g/dL — ABNORMAL LOW (ref 12.0–15.0)
MCH: 29.8 pg (ref 26.0–34.0)
MCHC: 32 g/dL (ref 30.0–36.0)
MCV: 93.2 fL (ref 78.0–100.0)
RDW: 15.4 % (ref 11.5–15.5)
WBC: 12 10*3/uL — ABNORMAL HIGH (ref 4.0–10.5)

## 2013-05-21 LAB — PROTIME-INR: Prothrombin Time: 18 seconds — ABNORMAL HIGH (ref 11.6–15.2)

## 2013-05-21 MED ORDER — SODIUM CHLORIDE 0.9 % IV SOLN: 100.0000 mL | INTRAVENOUS | Status: DC | PRN

## 2013-05-21 MED ORDER — WARFARIN SODIUM 4 MG PO TABS: 4.0000 mg | ORAL_TABLET | Freq: Once | ORAL | Status: DC

## 2013-05-21 MED ORDER — DARBEPOETIN ALFA-POLYSORBATE 60 MCG/0.3ML IJ SOLN
INTRAMUSCULAR | Status: AC
  Administered 2013-05-21: 60 ug via INTRAVENOUS
  Filled 2013-05-21: qty 0.3

## 2013-05-21 MED ORDER — AMIODARONE HCL 200 MG PO TABS: 200.0000 mg | ORAL_TABLET | Freq: Every day | ORAL | Status: DC

## 2013-05-21 MED ORDER — AMIODARONE HCL 200 MG PO TABS
200.0000 mg | ORAL_TABLET | Freq: Two times a day (BID) | ORAL | Status: DC
  Administered 2013-05-21 – 2013-05-22 (×3): 200 mg via ORAL
  Filled 2013-05-21 (×4): qty 1

## 2013-05-21 MED ORDER — MORPHINE SULFATE 2 MG/ML IJ SOLN: 1.0000 mg | INTRAMUSCULAR | Status: DC | PRN

## 2013-05-21 MED ORDER — DOXERCALCIFEROL 4 MCG/2ML IV SOLN
INTRAVENOUS | Status: AC
  Administered 2013-05-21: 3.5 ug via INTRAVENOUS
  Filled 2013-05-21: qty 2

## 2013-05-21 MED ORDER — WARFARIN SODIUM 6 MG PO TABS
6.0000 mg | ORAL_TABLET | Freq: Once | ORAL | Status: AC
  Administered 2013-05-21: 6 mg via ORAL
  Filled 2013-05-21: qty 1

## 2013-05-21 MED ORDER — METOPROLOL TARTRATE 25 MG PO TABS
25.0000 mg | ORAL_TABLET | Freq: Two times a day (BID) | ORAL | Status: DC
  Administered 2013-05-21 – 2013-05-22 (×2): 25 mg via ORAL
  Filled 2013-05-21 (×2): qty 1

## 2013-05-21 MED ORDER — ALTEPLASE 2 MG IJ SOLR
2.0000 mg | Freq: Once | INTRAMUSCULAR | Status: DC | PRN
  Filled 2013-05-21: qty 2

## 2013-05-21 NOTE — Procedures (Signed)
I was present at this dialysis session. I have reviewed the session itself and made appropriate changes.   Pt with N/V, weakness with PT this AM.  Went into AFib overnight.  On Amio now.  In NSR with HR in 60s on Tx. SBP 120s.  Pt w/o symptoms currently.  Goal UF is 3L and pt is 4kg above outpt EDW.  Will see if pt tolerates removal, might have to back off somewhat.    Sabra Heck  MD 05/21/2013, 9:03 AM

## 2013-05-21 NOTE — Progress Notes (Signed)
Physical Therapy Treatment Patient Details Name: [name] MRN: [id number] DOB: 1928-05-05 Today's Date: 05/21/2013 Time: 0750-0830 PT Time Calculation (min): 40 min  PT Assessment / Plan / Recommendation  History of Present Illness 79 yo female with history of atrial fibrillation on Coumadin, HTN, HLD, ESRD on HD MWF, grade II diastolic dysfunction.  Patient with recent fall at home with Lt humerus fx with splint/sling.  Patient now admitted on 10/31 with confusion, A-fib and NSTEMI.  Patient s/p stents on 11/7.  Plan additional stenting on Wed 11/12.   PT Comments   Pt progressing with gait and use of quad cane today. Pt continues to require assist and cueing for all transfers and gait. Pt became dizzy and nauseated with end of ambulation in bathroom with limited emesis. BP 189/61 with HR sinus 94, sats 100% and pt without report of pain, RN present. Pt transferred to bed end of session for HD. Will continue to follow.   Follow Up Recommendations  Home health PT;Supervision/Assistance - 24 hour     Does the patient have the potential to tolerate intense rehabilitation     Barriers to Discharge        Equipment Recommendations       Recommendations for Other Services    Frequency     Progress towards PT Goals Progress towards PT goals: Progressing toward goals  Plan Current plan remains appropriate    Precautions / Restrictions Precautions Precautions: Fall Precaution Comments: LUE in splint/sling Required Braces or Orthoses: Sling Restrictions Other Position/Activity Restrictions: LUE functionally NWB with splint/sling , pt unable to use thumb loop as it caused bruising yesterday.  Pertinent Vitals/Pain sats 100% HR 74-94 No pain    Mobility  Bed Mobility Bed Mobility: Sit to Supine;Scooting to HOB Sit to Supine: 3: Mod assist;HOB flat Scooting to HOB: 1: +2 Total assist Scooting to [facility]: Patient Percentage: 0% Details for Bed Mobility Assistance: pt EOB on  arrival, returned to bed to transfer to HD with assist to control trunk and bring bil LE onto surface.  Transfers Transfers: Stand Pivot Transfers Sit to Stand: 4: Min assist;From toilet;From bed;From chair/3-in-1 Stand to Sit: 3: Mod assist;4: Min assist;To chair/3-in-1;To toilet;To bed Stand Pivot Transfers: 4: Min assist Details for Transfer Assistance: cueing for hand placement, safety and sequence to push with RUE and reach for armrest, assist to control descent with fatigue as pt not fully positioned in front of toilet and mod assist to control descent to chair and bed min assist to sit Ambulation/Gait Ambulation/Gait Assistance: 4: Min guard Ambulation Distance (Feet): 150 Feet Assistive device: Small based quad cane Ambulation/Gait Assistance Details: cueing throughout for posture, placement and sequence of quad cane pt increasing speed end of gait due to urge to void Gait Pattern: Step-through pattern;Decreased stride length;Trunk flexed Gait velocity: 30'/50sec= .6 ft/sec placing pt at high fall risk Stairs: No    Exercises     PT Diagnosis:    PT Problem List:   PT Treatment Interventions:     PT Goals (current goals can now be found in the care plan section)    Visit Information  Last PT Received On: 05/21/13 Assistance Needed: +1 History of Present Illness: 79 yo female with history of atrial fibrillation on Coumadin, HTN, HLD, ESRD on HD MWF, grade II diastolic dysfunction.  Patient with recent fall at home with Lt humerus fx with splint/sling.  Patient now admitted on 10/31 with confusion, A-fib and NSTEMI.  Patient s/p stents on 11/7.  Plan additional stenting on Wed 11/12.    Subjective Data      Cognition  Cognition Arousal/Alertness: Awake/alert Behavior During Therapy: WFL for tasks assessed/performed Overall Cognitive Status: Within Functional Limits for tasks assessed    Balance     End of Session PT - End of Session Equipment Utilized During Treatment:  Gait belt;Other (comment) (sling) Activity Tolerance: Patient limited by fatigue Patient left: in bed;with call bell/phone within reach;with family/visitor present;with nursing/sitter in room Nurse Communication: Mobility status   GP     Toney Sang [facility] 05/21/2013, 9:58 AM Delaney Meigs, PT [phone]

## 2013-05-21 NOTE — Progress Notes (Signed)
[facility] Progress Note  TEAM 1 - Stepdown/ICU TEAM   [name] JYN:[id number] DOB: 10-Jan-1928 DOA: 05/07/2013 PCP: [name], MD  Admit HPI / Brief Narrative: 79 yo female with history of atrial fibrillation on Coumadin, HTN, HLD, ESRD on HD MWF, grade II diastolic dysfunction per last 2 D ECHO 11/2012. Fell at home 10/29 and was treated in th ER for a supracondylar humerus fracture with mild displacement and angulation of distal fragment. Splint was applied by the EDP and appointment made for pt to FU with Dr. [name]. She returned on 10/30 for inadequate pain control. She returned on 10/31 after she was found to be more confused after she had taken 2 tablets of percocet for pain. She apparently took her pain meds after HD then family noted she was somewhat confused. Per ED physician, pt became more alert compared to mental status on arrival. Of note she had presented to the ER 10/14 for palpitations.  Assessment/Plan:  Acute Encephalopathy - Likely due to percocet being used for arm pain + UTI  - B12 was low and folic acid low normal so replacing both  - MS appears to have returned to baseline, but is troubled by sundowning  - Physical therapy recommending 24 hour supervision & Home health PT.  ESRD on HD M/W/F -Nephrology following  NSTEMI 10/31 -Cardiology following - cath completed 11/4  -11/7: temp pacer, atherectomy, PTCA and DES stent x 2 in RCA - complicated by hypotension that required Dopamine and Levophed  - LAD atherectomy completed 11/12  Atrial fibrillation w/ RVR on chronic Coumadin -Cardiology is managing  -coumadin resumed post cath 11/12 but INR low -now in NSR.  Cards Rx'd Amiodarone po- 200 mg BID X 7 days, then 200 mg daily. -She remains inpatient and on step down (11/14) as her BP has been vacillating and she has been vomiting.  Warfarin induced coagulopathy -mgmt per pharmacy. -lovenox d/c'd  Grade II diastolic  dysfunction Well compensated at present   Supracondylar humerus fracture with mild displacement and angulation of distal fragment s/p fall 10/30 -discontinued fentanyl pain patch (11/14) patient has tramadol and tylenol for pain. Add low dose morphine prn severe pain. -Appreciate Dr. [name] assistance -XR revealed appropriate alignment of fx's so OK to continue splint and supportive care -will need to reschedule OP appointment for 1 week after dc -cont Miralax and Colace after dc -HHPT ordered for post discharge. Has used [facility] in the past.  Anemia of chronic renal disease iron deficiency and low normal B 12 and folic acid -Hgb trend has been stable - no evidence of blood loss at present - was given SQ B12 and PO Folic acid - will need to consider OP study to determine if cannot absorb B12 - will likely dc on PO B12 until that can be accomplished-allow renal to replete iron  HTN -BP is reasonably controlled at this time  E coli UTI -Rocephin - completed 10 day course 11/12   HLD  Code Status: FULL Family Communication: spoke with Daughter [name] on the phone. Disposition Plan: cancel transfer to tele due to recurrent a-fib with RVR and hypotension  Consultants: Cardiology Nephrology  Orthopedics  Procedures: Left heart cath 11/04  Complex PCI with temporary pacemaker and high speed rotational atherectomy, PTCA, cutting baloon and DES x 2  11/08  Complex PCI: relook of RCA, IVUS, HSRA of Lcx/OM1, cutting balloon, DES and HSRA/cutting balloon of inf.branch of OM1 vessel   Antibiotics: Cipro 10/31 >>  11/02 Rocephin 11/02 >> 11/12  DVT prophylaxis: SCDs  HPI/Subjective: No complaints.  Seen in HD.  Arm pain is controlled.  Objective: Blood pressure 133/61, pulse 71, temperature 98 F (36.7 C), temperature source Oral, resp. rate 18, height 5\' 5"  (1.651 m), weight 55.5 kg (122 lb 5.7 oz), SpO2 100.00%.  Intake/Output Summary (Last 24 hours) at  05/21/13 1220 Last data filed at 05/21/13 1145  Gross per 24 hour  Intake 1191.56 ml  Output   2376 ml  Net -1184.44 ml   Exam: General: Alert, orientated, pleasant, NAD, on HD. Lungs: Clear to auscultation bilaterally without wheezes or crackles, no accessory muscle use Cardiovascular: Regular rate & rhythm without murmur gallop or rub  Abdomen: Nontender, nondistended, soft, bowel sounds positive, no rebound, no ascites, no appreciable mass Extremities: No significant cyanosis, clubbing, or edema bilateral lower extremities - L UE in splint.  Changes of arthritis noted in left hand.  Data Reviewed: Basic Metabolic Panel:  Recent Labs Lab 05/15/13 0500 05/15/13 0817 05/19/13 0515 05/20/13 0425  NA 130* 132* 137 133*  K 5.8* 5.7* 3.6 3.8  CL 96 96 97 96  CO2 20 21 29 25   GLUCOSE 73 80 83 76  BUN 40* 41* 15 20  CREATININE 6.85* 7.22* 4.87* 5.92*  CALCIUM 8.6 8.7 8.6 8.3*  PHOS  --  6.9*  --   --    Liver Function Tests:  Recent Labs Lab 05/15/13 0817  ALBUMIN 2.6*   CBC:  Recent Labs Lab 05/17/13 0415 05/18/13 0520 05/19/13 0515 05/20/13 0425 05/21/13 0345  WBC 10.1 10.5 11.0* 10.9* 12.0*  HGB 9.9* 9.3* 10.0* 8.6* 8.7*  HCT 31.3* 29.1* 31.3* 26.9* 27.2*  MCV 92.1 92.4 92.6 93.7 93.2  PLT 293 314 321 285 291   Scheduled Meds:  Scheduled Meds: . amiodarone  200 mg Oral BID  . [START ON 05/28/2013] amiodarone  200 mg Oral Daily  . aspirin EC  81 mg Oral Daily  . atorvastatin  40 mg Oral q1800  . clopidogrel  75 mg Oral Q breakfast  . darbepoetin (ARANESP) injection - DIALYSIS  60 mcg Intravenous Q Wed-HD  . doxercalciferol  3.5 mcg Intravenous Q T,Th,Sa-HD  . folic acid  1 mg Oral Daily  . hydrALAZINE  10 mg Intravenous UD  . metoprolol tartrate  25 mg Oral BID  . multivitamin  1 tablet Oral QHS  . sevelamer carbonate  2,400 mg Oral TID WC  . Warfarin - Pharmacist Dosing Inpatient   Does not apply q1800    Stephani Police, [place] [phone] 05/21/2013, 12:20 PM  I have personally examined this patient and reviewed the entire database. I have reviewed the above note, made any necessary editorial changes, and agree with its content.  Lonia Blood, MD [facility]

## 2013-05-21 NOTE — Progress Notes (Signed)
1505 Offered to walk with pt. Eating lunch and does not want to walk at this time. Encouraged her to walk with nursing staff later. Duanne Limerick, RN 3:10 PM 05/21/2013

## 2013-05-21 NOTE — Progress Notes (Signed)
Patient walked with Physical Therapy and felt nauseated at the end of the walk and threw up a little. No meds given at this time. Vitals were stable. Patient escorted back to bed via PT because patient is going to dialysis around 830am. Will continue to monitor.  Warrick Llera, Charlaine Dalton RN

## 2013-05-21 NOTE — Progress Notes (Signed)
Subjective:  Up with PT, still weak. Holding NSR after Amiodarone added.  Objective:  Vital Signs in the last 24 hours: Temp:  [97.7 F (36.5 C)-98.6 F (37 C)] 97.7 F (36.5 C) (11/14 0414) Pulse Rate:  [66-121] 70 (11/14 0400) Resp:  [15-21] 15 (11/14 0400) BP: (89-148)/(35-59) 148/50 mmHg (11/14 0400) SpO2:  [93 %-100 %] 94 % (11/14 0400) Weight:  [125 lb (56.7 kg)-126 lb 5.2 oz (57.3 kg)] 125 lb (56.7 kg) (11/13 1357)  Intake/Output from previous day:  Intake/Output Summary (Last 24 hours) at 05/21/13 0806 Last data filed at 05/21/13 0600  Gross per 24 hour  Intake 1161.46 ml  Output    896 ml  Net 265.46 ml    . amiodarone  200 mg Oral BID  . [START ON 05/28/2013] amiodarone  200 mg Oral Daily  . aspirin EC  81 mg Oral Daily  . atorvastatin  40 mg Oral q1800  . clopidogrel  75 mg Oral Q breakfast  . darbepoetin (ARANESP) injection - DIALYSIS  60 mcg Intravenous Q Wed-HD  . doxercalciferol  3.5 mcg Intravenous Q T,Th,Sa-HD  . folic acid  1 mg Oral Daily  . hydrALAZINE  10 mg Intravenous UD  . metoprolol tartrate  25 mg Oral BID  . multivitamin  1 tablet Oral QHS  . sevelamer carbonate  2,400 mg Oral TID WC  . warfarin  6 mg Oral ONCE-1800  . Warfarin - Pharmacist Dosing Inpatient   Does not apply q1800    Physical Exam: General appearance: alert, cooperative, no distress and frail Alert No JVD Lungs: few crackles Lt base Heart: regular rate and rhythm and 2/6 systolic murmur Abd: soft No edema; soft cast on left arm   Rate: 78  Rhythm: normal sinus rhythm  Lab Results:  Recent Labs  05/20/13 0425 05/21/13 0345  WBC 10.9* 12.0*  HGB 8.6* 8.7*  PLT 285 291    Recent Labs  05/19/13 0515 05/20/13 0425  NA 137 133*  K 3.6 3.8  CL 97 96  CO2 29 25  GLUCOSE 83 76  BUN 15 20  CREATININE 4.87* 5.92*   No results found for this basename: TROPONINI, CK, MB,  in the last 72 hours  Recent Labs  05/21/13 0345  INR 1.53*    Imaging: Imaging  results have been reviewed  Cardiac Studies:  Assessment/Plan:   Principal Problem:   Encephalopathy, - admitted with MS changes 05/07/13 Active Problems:   NSTEMI 05/07/13   CAD- RCA HSRA/DES 05/15/13-  CFX/OM1 DES 05/19/13   ESRD (end stage renal disease) on dialysis   Moderate aortic stenosis- EF 60-65% 05/08/13   PAF (paroxysmal atrial fibrillation)- Amiodarone - NSR   Anemia   HTN (hypertension)   Long term (current) use of anticoagulants   UTI (urinary tract infection)   Fall at home prior to admission 10/31- Fx Lt arm   Dyslipidemia    PLAN: NSR with Amiodarone. Her Diltiazem was held yesterday because of dialysis, B/P.  It may be best to continue Amiodarone long term for PAF. Will change to po- 200 mg BID X 7 days, then 200 mg daily. Will stop Diltiazem, decrease Lopressor back to 25 mg BID. She is on Lovenox-Coumadin- ASA 81 mg-Plavix till her INR is > 2.0. She is probably not ready for discharge yet- possibly in 24 hrs if her rhythm is stable. Transfer to telemetry after dialysis if she tolerates that well.   Corine Shelter PA-C [facility] [phone] 05/21/2013, 8:06 AM  Patient seen and examined. Agree with assessment and plan. Just back from dialysis. Now on oral amiodarone. DC lovenox due to bleed risk.    Lennette Bihari, MD, [facility] 05/21/2013 3:12 PM

## 2013-05-21 NOTE — Progress Notes (Addendum)
ANTICOAGULATION CONSULT NOTE - Follow up  Pharmacy Consult for Warfarin Indication: atrial fibrillation  No Known Allergies  Patient Measurements: Height: 5\' 5"  (165.1 cm) Weight: 122 lb 5.7 oz (55.5 kg) IBW/kg (Calculated) : 57   Vital Signs: Temp: 98 F (36.7 C) (11/14 1145) Temp src: Oral (11/14 1145) BP: 133/61 mmHg (11/14 1145) Pulse Rate: 71 (11/14 1145)  Labs:  Recent Labs  05/19/13 0515 05/20/13 0425 05/21/13 0345  HGB 10.0* 8.6* 8.7*  HCT 31.3* 26.9* 27.2*  PLT 321 285 291  LABPROT 16.5* 18.0* 18.0*  INR 1.37 1.53* 1.53*  CREATININE 4.87* 5.92*  --     Estimated Creatinine Clearance: 6.4 ml/min (by C-G formula based on Cr of 5.92).   Medical History: Past Medical History  Diagnosis Date  . Anemia 03/06/2011  . Hypertension   . Coronary artery disease   . Renal disorder   . Aortic stenosis, mild   . A-fib   . Acute CHF       Assessment: 84yof with ESRD and Afib admitted with sedation after pain meds, hypotension and elevated troponin.  She had elevated INR on admit 4.57 which was reversed with vit k and held for cath. Cath 11/12 with DES to LCx.  Started on ASA81 and Plavix. INR remains constant at 1.53. Lovenox was d/c'ed and amiodarone started on 11/14. CBC low will follow, no bleeding noted.    Goal of Therapy:  INR 2-3 Monitor platelets by anticoagulation protocol: Yes    Plan:  - Give Coumadin 6 mg PO today x 1 dose - F/U CBC, PT/INR, and amiodarone interaction - Monitor for s/s of bleeding.    Vinnie Level, PharmD.  [place] License #[id number] Application for [place] reciprocity pending  Clinical Pharmacist Pager [phone]   Kallista Pae D. Laney Potash, PharmD, BCPS Pager:  [phone] 05/21/2013, 1:37 PM

## 2013-05-22 LAB — CBC
MCH: 30 pg (ref 26.0–34.0)
MCHC: 32.4 g/dL (ref 30.0–36.0)
Platelets: 251 10*3/uL (ref 150–400)
RBC: 2.63 MIL/uL — ABNORMAL LOW (ref 3.87–5.11)
RDW: 15.6 % — ABNORMAL HIGH (ref 11.5–15.5)

## 2013-05-22 LAB — PROTIME-INR: Prothrombin Time: 25.1 seconds — ABNORMAL HIGH (ref 11.6–15.2)

## 2013-05-22 MED ORDER — RENA-VITE PO TABS: 1.0000 | ORAL_TABLET | Freq: Every day | ORAL | Status: DC

## 2013-05-22 MED ORDER — AMIODARONE HCL 200 MG PO TABS: 200.0000 mg | ORAL_TABLET | Freq: Every day | ORAL | Status: DC

## 2013-05-22 MED ORDER — DOXERCALCIFEROL 4 MCG/2ML IV SOLN: 3.5000 ug | INTRAVENOUS | Status: DC

## 2013-05-22 MED ORDER — WARFARIN SODIUM 6 MG PO TABS: ORAL_TABLET | ORAL | Status: DC

## 2013-05-22 MED ORDER — DARBEPOETIN ALFA-POLYSORBATE 100 MCG/0.5ML IJ SOLN: 100.0000 ug | INTRAMUSCULAR | Status: DC

## 2013-05-22 MED ORDER — FOLIC ACID 1 MG PO TABS: 1.0000 mg | ORAL_TABLET | Freq: Every day | ORAL | Status: DC

## 2013-05-22 MED ORDER — AMIODARONE HCL 200 MG PO TABS: 200.0000 mg | ORAL_TABLET | Freq: Two times a day (BID) | ORAL | Status: DC

## 2013-05-22 MED ORDER — METOPROLOL TARTRATE 25 MG PO TABS: 25.0000 mg | ORAL_TABLET | Freq: Two times a day (BID) | ORAL | Status: DC

## 2013-05-22 MED ORDER — TRAMADOL HCL 50 MG PO TABS: 50.0000 mg | ORAL_TABLET | Freq: Four times a day (QID) | ORAL | Status: DC | PRN

## 2013-05-22 MED ORDER — DILTIAZEM HCL ER COATED BEADS 180 MG PO CP24: 180.0000 mg | ORAL_CAPSULE | Freq: Every day | ORAL | Status: DC

## 2013-05-22 MED ORDER — CLOPIDOGREL BISULFATE 75 MG PO TABS: 75.0000 mg | ORAL_TABLET | Freq: Every day | ORAL | Status: DC

## 2013-05-22 MED ORDER — ASPIRIN 81 MG PO TBEC: 81.0000 mg | DELAYED_RELEASE_TABLET | Freq: Every day | ORAL | Status: DC

## 2013-05-22 NOTE — Discharge Summary (Addendum)
DISCHARGE SUMMARY  [name]  MR#: [id number]  DOB:1927-12-06  Date of Admission: 05/07/2013 Date of Discharge: 05/22/2013  Attending Physician:Ladarien Beeks T  Patient's YQM:VHQI,ONGEXB K, MD  Consults: Cardiology - Darene Lamer Nephrology Ortho/Hand - [name]  Disposition: D/C home w/ family   Follow-up Appts:     Follow-up Information   Follow up with [facility] K, MD. Schedule an appointment as soon as possible for a visit in 1 week.   Specialty:  Family Medicine   Contact information:   [address] [place] [address] [phone]       Follow up with [facility] A, MD. Schedule an appointment as soon as possible for a visit in 1 week.   Specialty:  Orthopedic Surgery   Contact information:   [address] [place] [address] [phone]       Follow up with Marykay Lex, MD. Schedule an appointment as soon as possible for a visit in 5 days.   Specialty:  Cardiology   Contact information:   [address] [place] [address] [phone]      Tests Needing Follow-up: -) recheck of Hgb is suggested during HD on Monday, 11/17 -) recheck of B12 level is suggested in 8-12 weeks to determine if pt is absorbing oral B12 adequately - goal if B12 level >400  Discharge Diagnoses: Acute Encephalopathy  ESRD on HD M/W/F  NSTEMI 10/31  Atrial fibrillation w/ RVR on chronic Coumadin  Warfarin induced coagulopathy - resolved  Grade II diastolic dysfunction  Supracondylar humerus fracture with mild displacement and angulation of distal fragment s/p fall 10/30  Anemia of chronic renal disease iron deficiency and low normal B 12 and folic acid  HTN  E coli UTI  HLD  Initial presentation: 79 yo female with history of atrial fibrillation on Coumadin, HTN, HLD, ESRD on HD MWF, grade II diastolic dysfunction per last 2 D ECHO 11/2012. Fell at home 10/29 and was treated in th ER for a supracondylar humerus  fracture with mild displacement and angulation of distal fragment. Splint was applied by the EDP and appointment made for pt to FU with Dr. [name]. She returned on 10/30 for inadequate pain control. She returned on 10/31 after she was found to be more confused after she had taken 2 tablets of percocet for pain. Per ED physician, pt became more alert compared to mental status on arrival. Of note she had presented to the ER 10/14 for palpitations.   Hospital Course:  Acute Encephalopathy  - Likely due to percocet being used for arm pain + UTI  - B12 was low and folic acid low normal so replacing both  - MS appears to have returned to baseline, but is troubled by sundowning  - Physical therapy recommending 24 hour supervision & Home health PT   ESRD on HD M/W/F  -Nephrology following   NSTEMI 10/31  - Cardiology following - cath completed 11/4  - 11/7: temp pacer, atherectomy, PTCA and DES stent x 2 in RCA - complicated by hypotension that required Dopamine and Levophed  - LAD atherectomy completed 11/12  - per Cardiology "ASA/Plavix & Warfarin... would like to get through 1st month on all 3, but can stop ASA after 1st month (s/p DES x 2)"   Atrial fibrillation w/ RVR on chronic Coumadin  -Cardiology is managing  -coumadin resumed post cath - INR now at goal  -now in NSR. Cards Rx'd Amiodarone po- 200 mg BID X 7 days, then  200 mg daily.   Warfarin induced coagulopathy - resolved  -mgmt per pharmacy  -lovenox d/c'd   Grade II diastolic dysfunction  Well compensated at present   Supracondylar humerus fracture with mild displacement and angulation of distal fragment s/p fall 10/30  -discontinued fentanyl pain patch (11/14) patient has tramadol and tylenol for pain. Add low dose morphine prn severe pain.  -Appreciate Dr. [name] assistance  -XR revealed appropriate alignment of fx's so OK to continue splint and supportive care  -will need to reschedule OP appointment for 1 week after  dc  -cont Miralax and Colace after dc  -HHPT ordered for post discharge. Has used [facility] in the past.   Anemia of chronic renal disease iron deficiency and low normal B 12 and folic acid  - Hgb trend had been stable - no evidence of acute blood loss at time of d/c  - pt strongly desires d/c home - will be observed by family who lives with her - advised her to return to the ED immediately if she notes blood loss, dizziness, severe sob, or syncope - was given SQ B12 and PO Folic acid - will need outpt B12 f/u in 8-12 weeks to determine if she is absorbing via oral route   HTN  -BP is reasonably controlled at this time   E coli UTI  - completed 10 day course of Rocephin 11/12   HLD    Medication List    STOP taking these medications       diltiazem 120 MG 24 hr capsule  Commonly known as:  DILACOR XR     labetalol 200 MG tablet  Commonly known as:  NORMODYNE     oxyCODONE-acetaminophen 5-325 MG per tablet  Commonly known as:  PERCOCET/ROXICET     sodium bicarbonate 650 MG tablet      TAKE these medications       amiodarone 200 MG tablet  Commonly known as:  PACERONE  Take 1 tablet (200 mg total) by mouth 2 (two) times daily.     amiodarone 200 MG tablet  Commonly known as:  PACERONE  Take 1 tablet (200 mg total) by mouth daily.  Start taking on:  05/28/2013     aspirin 81 MG EC tablet  Take 1 tablet (81 mg total) by mouth daily. OVER THE COUNTER     atorvastatin 40 MG tablet  Commonly known as:  LIPITOR  Take 40 mg by mouth daily.     clopidogrel 75 MG tablet  Commonly known as:  PLAVIX  Take 1 tablet (75 mg total) by mouth daily with breakfast.     darbepoetin 100 MCG/0.5ML Soln injection  Commonly known as:  ARANESP  Inject 0.5 mLs (100 mcg total) into the vein every Wednesday with hemodialysis.  Start taking on:  05/26/2013     doxercalciferol 4 MCG/2ML injection  Commonly known as:  HECTOROL  Inject 1.75 mLs (3.5 mcg total) into the vein  Every Tuesday,Thursday,and Saturday with dialysis.     folic acid 1 MG tablet  Commonly known as:  FOLVITE  Take 1 tablet (1 mg total) by mouth daily.     metoprolol tartrate 25 MG tablet  Commonly known as:  LOPRESSOR  Take 1 tablet (25 mg total) by mouth 2 (two) times daily.     multivitamin Tabs tablet  Take 1 tablet by mouth at bedtime.     sevelamer carbonate 800 MG tablet  Commonly known as:  RENVELA  Take 2,400  mg by mouth 3 (three) times daily with meals.     sorbitol 70 % solution  Take 30 mLs by mouth at bedtime as needed.     traMADol 50 MG tablet  Commonly known as:  ULTRAM  Take 1 tablet (50 mg total) by mouth every 6 (six) hours as needed for moderate pain.     warfarin 6 MG tablet  Commonly known as:  COUMADIN  Take 1 half tablet (3mg ) daily or as directed by Coumadin Clinic       Day of Discharge BP 123/45  Pulse 70  Temp(Src) 97.7 F (36.5 C) (Oral)  Resp 16  Ht 5\' 5"  (1.651 m)  Wt 55.5 kg (122 lb 5.7 oz)  BMI 20.36 kg/m2  SpO2 97%  Physical Exam: General: Alert, orientated, pleasant, NAD Lungs: Clear to auscultation bilaterally without wheezes or crackles, no accessory muscle use  Cardiovascular: Regular rate & rhythm without gallop or rub - 2/6 holosystolic M Abdomen: Nontender, nondistended, soft, bowel sounds positive, no rebound, no ascites, no appreciable mass  Extremities: No significant cyanosis, clubbing, or edema bilateral lower extremities - L UE in splint. Changes of arthritis noted in left hand  Results for orders placed during the hospital encounter of 05/07/13 (from the past 24 hour(s))  CBC     Status: Abnormal   Collection Time    05/22/13  4:45 AM      Result Value Range   WBC 11.1 (*) 4.0 - 10.5 K/uL   RBC 2.63 (*) 3.87 - 5.11 MIL/uL   Hemoglobin 7.9 (*) 12.0 - 15.0 g/dL   HCT 11.9 (*) 14.7 - 82.9 %   MCV 92.8  78.0 - 100.0 fL   MCH 30.0  26.0 - 34.0 pg   MCHC 32.4  30.0 - 36.0 g/dL   RDW 56.2 (*) 13.0 - 86.5 %    Platelets 251  150 - 400 K/uL  PROTIME-INR     Status: Abnormal   Collection Time    05/22/13  4:45 AM      Result Value Range   Prothrombin Time 25.1 (*) 11.6 - 15.2 seconds   INR 2.37 (*) 0.00 - 1.49    Time spent in discharge (includes decision making & examination of pt): >30 minutes  05/22/2013, 1:47 PM   Lonia Blood, MD [facility] Office  [phone] Pager [phone]  On-Call/Text Page:      [email or web address]      password [facility], [place]

## 2013-05-22 NOTE — Progress Notes (Signed)
CARDIAC REHAB PHASE I   PRE:  Rate/Rhythm: 73 sinus  BP:  Supine:   Sitting: 117/30  Standing:    SaO2: 98 RA  MODE:  Ambulation: 170 ft   POST:  Rate/Rhythem: 86 sinus  BP:  Supine:   Sitting: 101/55  Standing:    SaO2:   Pt ambulated 170 ft with assist x2 using cane.  Pt tolerated walk well without complaints or rest breaks.  Pt is eager to get home.  Discussed MI booklet, diet, exercise, restrictions, stent, NTG use, and when to call 911/MD.  Pt and daughter voiced understanding.  Will refer to Cardiac Rehab Phase II at [facility] for after home health completed. Fabio Pierce, MA, ACSM RCEP [phone]  Hazle Nordmann

## 2013-05-22 NOTE — Progress Notes (Signed)
Subjective: Feeling better  Objective: Vital signs in last 24 hours: Temp:  [97.7 F (36.5 C)-99.7 F (37.6 C)] 97.9 F (36.6 C) (11/15 0609) Pulse Rate:  [65-74] 70 (11/14 2011) Resp:  [12-22] 16 (11/15 0609) BP: (93-165)/(38-71) 120/41 mmHg (11/15 0609) SpO2:  [95 %-100 %] 96 % (11/15 0609) Weight:  [122 lb 5.7 oz (55.5 kg)] 122 lb 5.7 oz (55.5 kg) (11/14 0830) Last BM Date: 05/21/13  Intake/Output from previous day: 11/14 0701 - 11/15 0700 In: 136.9 [I.V.:136.9] Out: 1500  Intake/Output this shift:    Medications Current Facility-Administered Medications  Medication Dose Route Frequency Provider Last Rate Last Dose  . acetaminophen (TYLENOL) tablet 650 mg  650 mg Oral Q6H PRN Lonia Blood, MD   650 mg at 05/19/13 2028  . acetaminophen (TYLENOL) tablet 650 mg  650 mg Oral Q4H PRN Lennette Bihari, MD      . amiodarone (PACERONE) tablet 200 mg  200 mg Oral BID Eda Paschal Kilroy, PA-C   200 mg at 05/21/13 2132  . [START ON 05/28/2013] amiodarone (PACERONE) tablet 200 mg  200 mg Oral Daily Eda Paschal Kilroy, PA-C      . aspirin EC tablet 81 mg  81 mg Oral Daily Lennette Bihari, MD   81 mg at 05/21/13 1257  . atorvastatin (LIPITOR) tablet 40 mg  40 mg Oral q1800 Calvert Cantor, MD   40 mg at 05/21/13 1723  . clopidogrel (PLAVIX) tablet 75 mg  75 mg Oral Q breakfast Lennette Bihari, MD   75 mg at 05/21/13 1257  . darbepoetin (ARANESP) injection 60 mcg  60 mcg Intravenous Q Wed-HD Maree Krabbe, MD   60 mcg at 05/21/13 1144  . doxercalciferol (HECTOROL) injection 3.5 mcg  3.5 mcg Intravenous Q T,Th,Sa-HD Arita Miss, MD   3.5 mcg at 05/21/13 1145  . feeding supplement (NEPRO CARB STEADY) liquid 237 mL  237 mL Oral PRN Maree Krabbe, MD      . folic acid (FOLVITE) tablet 1 mg  1 mg Oral Daily Calvert Cantor, MD   1 mg at 05/21/13 1257  . heparin injection 1,000 Units  1,000 Units Dialysis PRN Maree Krabbe, MD      . hydrALAZINE (APRESOLINE) injection 10 mg  10 mg Intravenous  UD Runell Gess, MD      . HYDROmorphone (DILAUDID) injection 0.5 mg  0.5 mg Intravenous Q2H PRN Chrystie Nose, MD   0.5 mg at 05/19/13 1844  . lidocaine (PF) (XYLOCAINE) 1 % injection 5 mL  5 mL Intradermal PRN Maree Krabbe, MD      . lidocaine-prilocaine (EMLA) cream 1 application  1 application Topical PRN Maree Krabbe, MD      . metoprolol tartrate (LOPRESSOR) tablet 25 mg  25 mg Oral BID Abelino Derrick, PA-C   25 mg at 05/21/13 2129  . morphine 2 MG/ML injection 1-2 mg  1-2 mg Intravenous Q4H PRN Stephani Police, PA-C      . multivitamin (RENA-VIT) tablet 1 tablet  1 tablet Oral QHS Kerin Salen, PA-C   1 tablet at 05/21/13 2129  . ondansetron (ZOFRAN) tablet 4 mg  4 mg Oral Q6H PRN Dorothea Ogle, MD       Or  . ondansetron [facility]) injection 4 mg  4 mg Intravenous Q6H PRN Dorothea Ogle, MD      . ondansetron [facility]) injection 4 mg  4 mg Intravenous Q6H PRN  Lennette Bihari, MD      . pentafluoroprop-tetrafluoroeth Peggye Pitt) aerosol 1 application  1 application Topical PRN Maree Krabbe, MD      . QUEtiapine (SEROQUEL) tablet 12.5 mg  12.5 mg Oral QHS PRN Maree Krabbe, MD   12.5 mg at 05/21/13 2327  . sevelamer carbonate (RENVELA) tablet 2,400 mg  2,400 mg Oral TID WC Dorothea Ogle, MD   2,400 mg at 05/21/13 1723  . sorbitol 70 % solution 30 mL  30 mL Oral Daily PRN Calvert Cantor, MD   30 mL at 05/21/13 1504  . traMADol (ULTRAM) tablet 50 mg  50 mg Oral Q6H PRN Lonia Blood, MD   50 mg at 05/21/13 2327  . Warfarin - Pharmacist Dosing Inpatient   Does not apply q1800 Calvert Cantor, MD        PE: General appearance: alert, cooperative and no distress  Lungs: clear to auscultation bilaterally  Heart: regular rate and rhythm and 2/6 systolic MM  Extremities: No LEE  Pulses: 2+ and symmetric  Skin: Warm and dry. Neurologic: Grossly normal   Lab Results:   Recent Labs  05/20/13 0425 05/21/13 0345 05/22/13 0445  WBC 10.9* 12.0* 11.1*  HGB 8.6* 8.7* 7.9*    HCT 26.9* 27.2* 24.4*  PLT 285 291 251   BMET  Recent Labs  05/20/13 0425  NA 133*  K 3.8  CL 96  CO2 25  GLUCOSE 76  BUN 20  CREATININE 5.92*  CALCIUM 8.3*   PT/INR  Recent Labs  05/20/13 0425 05/21/13 0345 05/22/13 0445  LABPROT 18.0* 18.0* 25.1*  INR 1.53* 1.53* 2.37*    Assessment/Plan   Principal Problem:   Encephalopathy, - admitted with MS changes 05/07/13 Active Problems:   Anemia   HTN (hypertension)   ESRD (end stage renal disease) on dialysis   Moderate aortic stenosis- EF 60-65% 05/08/13   Long term (current) use of anticoagulants   PAF (paroxysmal atrial fibrillation)- Amiodarone - NSR   NSTEMI 05/07/13   UTI (urinary tract infection)   CAD- RCA HSRA/DES 05/15/13-  CFX/OM1 DES 05/19/13   Fall at home prior to admission 10/31- Fx Lt arm   Dyslipidemia  Plan:   SP HSRA, PTCA and stentingCirc/OMand marginal vessel.  Maintaining NSR.  On Amiodarone 200mg  PO BID for 7 days.  Change to 200mg  daily on 05/28/13.  Diltiazem was DCd.  Earlier in her stay we continually had to restart the dilt.  Hopefully the Amio will maintain her.  INR is 2.37.  Lovenox was DCd.  She will have a lot of help when she goes home.  Follow up in 7 days at our office.    LOS: 15 days    HAGER, BRYAN 05/22/2013 8:17 AM  Has remained stable from a cardiac standpoint - no further Afib on Amiodarone.  This may be a better option to avoid hypotension with HD.  No angina.  No dyspnea.  Of note - her Hgb is down this AM to 7.9  & she is on ASA/Plavix & Warfarin.  Would like to get through 1st month on all 3, but can stop ASA after 1st month (s/p DES x 2).  No sign of GI bleed - Nephrology has initiated iron supplementation + Aranesp in setting of ESRD.  On BB with CCB stopped in lieu of Amio. On statin.  She seems relatively stable overall - will need Hgb check next week (? If can check with HD).   Would ne  ok to d/c from Cardiac standpoint - defer timing to Nephrology &  TRH. Will need TCM f/u @ [facility] this week.  Marykay Lex, MD

## 2013-05-22 NOTE — Progress Notes (Signed)
Admit: 05/07/2013 LOS: 15  11F w/ NSTEMI, ESRD (MWF as outpt, THS in house)  Subjective:  HD for past 2 days, now on schedule    11/14 0701 - 11/15 0700 In: 136.9 [I.V.:136.9] Out: 1500   Filed Weights   05/20/13 1110 05/20/13 1357 05/21/13 0830  Weight: 57.3 kg (126 lb 5.2 oz) 56.7 kg (125 lb) 55.5 kg (122 lb 5.7 oz)    Current meds: reviewed including darbepoeitin qWk, Renvela 2400 qAC Current Labs: reviewed   Outpt HD Orders Unit: [facility] Time: 3h Dialyzer:  EDW: 51.5kg K/Ca: 2K/2.5Ca Access: LUA AVF UF Proflie:  VDRA: 3.5 Hectoral qTx EPO: Epo 2200 2x/wk IV Fe:    Physical Exam:  Blood pressure 140/54, pulse 70, temperature 98 F (36.7 C), temperature source Oral, resp. rate 16, height 5\' 5"  (1.651 m), weight 55.5 kg (122 lb 5.7 oz), SpO2 99.00%. NAD, appears well IRIR CTAB, nl wob No rashes/lesions Nonfocal.  AAOx3  Assessment/Plan 1. ESRD, outpt MWF, AVF, [place]:  2. HTN/Vol: stable.  Off dilitiazem.  BP during Tx w110s/40-50s.  EDW right now likely around 53 to 54kg.    3. Anemia: Inc Aranesp dose today.   4. MBD: Ca at goal.  No changes.  Cont binder.  On VDRA 5. NSTEMI: per cardiology.  S/p HSRA of L system 11/12.  Sabra Heck MD 05/22/2013, 9:59 AM   Recent Labs Lab 05/19/13 0515 05/20/13 0425  NA 137 133*  K 3.6 3.8  CL 97 96  CO2 29 25  GLUCOSE 83 76  BUN 15 20  CREATININE 4.87* 5.92*  CALCIUM 8.6 8.3*    Recent Labs Lab 05/20/13 0425 05/21/13 0345 05/22/13 0445  WBC 10.9* 12.0* 11.1*  HGB 8.6* 8.7* 7.9*  HCT 26.9* 27.2* 24.4*  MCV 93.7 93.2 92.8  PLT 285 291 251    Current Facility-Administered Medications  Medication Dose Route Frequency Provider Last Rate Last Dose  . acetaminophen (TYLENOL) tablet 650 mg  650 mg Oral Q6H PRN Lonia Blood, MD   650 mg at 05/19/13 2028  . acetaminophen (TYLENOL) tablet 650 mg  650 mg Oral Q4H PRN Lennette Bihari, MD      . amiodarone (PACERONE) tablet 200 mg   200 mg Oral BID Eda Paschal Kilroy, PA-C   200 mg at 05/21/13 2132  . [START ON 05/28/2013] amiodarone (PACERONE) tablet 200 mg  200 mg Oral Daily Eda Paschal Kilroy, PA-C      . aspirin EC tablet 81 mg  81 mg Oral Daily Lennette Bihari, MD   81 mg at 05/21/13 1257  . atorvastatin (LIPITOR) tablet 40 mg  40 mg Oral q1800 Calvert Cantor, MD   40 mg at 05/21/13 1723  . clopidogrel (PLAVIX) tablet 75 mg  75 mg Oral Q breakfast Lennette Bihari, MD   75 mg at 05/22/13 2841  . [START ON 05/26/2013] darbepoetin (ARANESP) injection 100 mcg  100 mcg Intravenous Q Wed-HD Arita Miss, MD      . doxercalciferol (HECTOROL) injection 3.5 mcg  3.5 mcg Intravenous Q T,Th,Sa-HD Arita Miss, MD   3.5 mcg at 05/21/13 1145  . feeding supplement (NEPRO CARB STEADY) liquid 237 mL  237 mL Oral PRN Maree Krabbe, MD      . folic acid (FOLVITE) tablet 1 mg  1 mg Oral Daily Calvert Cantor, MD   1 mg at 05/21/13 1257  . heparin injection 1,000 Units  1,000 Units Dialysis PRN Molly Maduro  D Schertz, MD      . hydrALAZINE (APRESOLINE) injection 10 mg  10 mg Intravenous UD Runell Gess, MD      . HYDROmorphone (DILAUDID) injection 0.5 mg  0.5 mg Intravenous Q2H PRN Chrystie Nose, MD   0.5 mg at 05/19/13 1844  . lidocaine (PF) (XYLOCAINE) 1 % injection 5 mL  5 mL Intradermal PRN Maree Krabbe, MD      . lidocaine-prilocaine (EMLA) cream 1 application  1 application Topical PRN Maree Krabbe, MD      . metoprolol tartrate (LOPRESSOR) tablet 25 mg  25 mg Oral BID Abelino Derrick, PA-C   25 mg at 05/22/13 1610  . morphine 2 MG/ML injection 1-2 mg  1-2 mg Intravenous Q4H PRN Stephani Police, PA-C      . multivitamin (RENA-VIT) tablet 1 tablet  1 tablet Oral QHS Kerin Salen, PA-C   1 tablet at 05/21/13 2129  . ondansetron (ZOFRAN) tablet 4 mg  4 mg Oral Q6H PRN Dorothea Ogle, MD       Or  . ondansetron [facility]) injection 4 mg  4 mg Intravenous Q6H PRN Dorothea Ogle, MD      . ondansetron [facility]) injection 4 mg  4 mg Intravenous  Q6H PRN Lennette Bihari, MD      . pentafluoroprop-tetrafluoroeth (GEBAUERS) aerosol 1 application  1 application Topical PRN Maree Krabbe, MD      . QUEtiapine (SEROQUEL) tablet 12.5 mg  12.5 mg Oral QHS PRN Maree Krabbe, MD   12.5 mg at 05/21/13 2327  . sevelamer carbonate (RENVELA) tablet 2,400 mg  2,400 mg Oral TID WC Dorothea Ogle, MD   2,400 mg at 05/22/13 9604  . sorbitol 70 % solution 30 mL  30 mL Oral Daily PRN Calvert Cantor, MD   30 mL at 05/21/13 1504  . traMADol (ULTRAM) tablet 50 mg  50 mg Oral Q6H PRN Lonia Blood, MD   50 mg at 05/21/13 2327  . Warfarin - Pharmacist Dosing Inpatient   Does not apply [id number] Calvert Cantor, MD

## 2013-05-26 ENCOUNTER — Ambulatory Visit (INDEPENDENT_AMBULATORY_CARE_PROVIDER_SITE_OTHER): Payer: PRIVATE HEALTH INSURANCE | Admitting: *Deleted

## 2013-05-26 ENCOUNTER — Encounter: Payer: Self-pay | Admitting: *Deleted

## 2013-05-26 DIAGNOSIS — Z7901 Long term (current) use of anticoagulants: Secondary | ICD-10-CM

## 2013-05-26 LAB — POCT INR: INR: 6.9

## 2013-05-28 ENCOUNTER — Other Ambulatory Visit (HOSPITAL_COMMUNITY): Payer: Self-pay | Admitting: Physician Assistant

## 2013-05-28 ENCOUNTER — Encounter: Payer: Self-pay | Admitting: Physician Assistant

## 2013-05-28 ENCOUNTER — Telehealth: Payer: Self-pay | Admitting: Cardiovascular Disease

## 2013-05-28 ENCOUNTER — Ambulatory Visit (HOSPITAL_BASED_OUTPATIENT_CLINIC_OR_DEPARTMENT_OTHER)
Admission: RE | Admit: 2013-05-28 | Discharge: 2013-05-28 | Disposition: A | Discharge status: Home / Self Care | Payer: Medicare Other | Source: Ambulatory Visit | Attending: Physician Assistant | Admitting: Physician Assistant

## 2013-05-28 ENCOUNTER — Ambulatory Visit (INDEPENDENT_AMBULATORY_CARE_PROVIDER_SITE_OTHER): Payer: Medicare Other | Admitting: Physician Assistant

## 2013-05-28 ENCOUNTER — Encounter (HOSPITAL_COMMUNITY): Payer: Self-pay

## 2013-05-28 ENCOUNTER — Telehealth: Payer: Self-pay | Admitting: Physician Assistant

## 2013-05-28 ENCOUNTER — Inpatient Hospital Stay (HOSPITAL_COMMUNITY)
Admission: AD | Admit: 2013-05-28 | Discharge: 2013-06-05 | DRG: 299 | Disposition: A | Discharge status: Home / Self Care | Payer: Medicare Other | Source: Ambulatory Visit | Attending: Internal Medicine | Admitting: Internal Medicine

## 2013-05-28 ENCOUNTER — Ambulatory Visit (INDEPENDENT_AMBULATORY_CARE_PROVIDER_SITE_OTHER): Payer: PRIVATE HEALTH INSURANCE | Admitting: *Deleted

## 2013-05-28 VITALS — BP 130/50 | HR 71 | Ht 64.0 in | Wt 117.0 lb

## 2013-05-28 DIAGNOSIS — I480 Paroxysmal atrial fibrillation: Secondary | ICD-10-CM | POA: Diagnosis present

## 2013-05-28 DIAGNOSIS — Y849 Medical procedure, unspecified as the cause of abnormal reaction of the patient, or of later complication, without mention of misadventure at the time of the procedure: Secondary | ICD-10-CM | POA: Diagnosis present

## 2013-05-28 DIAGNOSIS — I10 Essential (primary) hypertension: Secondary | ICD-10-CM

## 2013-05-28 DIAGNOSIS — I359 Nonrheumatic aortic valve disorder, unspecified: Secondary | ICD-10-CM | POA: Diagnosis present

## 2013-05-28 DIAGNOSIS — D649 Anemia, unspecified: Secondary | ICD-10-CM | POA: Diagnosis present

## 2013-05-28 DIAGNOSIS — I350 Nonrheumatic aortic (valve) stenosis: Secondary | ICD-10-CM

## 2013-05-28 DIAGNOSIS — Z7901 Long term (current) use of anticoagulants: Secondary | ICD-10-CM

## 2013-05-28 DIAGNOSIS — N186 End stage renal disease: Secondary | ICD-10-CM | POA: Diagnosis present

## 2013-05-28 DIAGNOSIS — I2510 Atherosclerotic heart disease of native coronary artery without angina pectoris: Secondary | ICD-10-CM | POA: Diagnosis present

## 2013-05-28 DIAGNOSIS — T148XXA Other injury of unspecified body region, initial encounter: Secondary | ICD-10-CM

## 2013-05-28 DIAGNOSIS — M899 Disorder of bone, unspecified: Secondary | ICD-10-CM | POA: Diagnosis present

## 2013-05-28 DIAGNOSIS — I998 Other disorder of circulatory system: Secondary | ICD-10-CM

## 2013-05-28 DIAGNOSIS — I120 Hypertensive chronic kidney disease with stage 5 chronic kidney disease or end stage renal disease: Secondary | ICD-10-CM | POA: Diagnosis present

## 2013-05-28 DIAGNOSIS — I509 Heart failure, unspecified: Secondary | ICD-10-CM

## 2013-05-28 DIAGNOSIS — I729 Aneurysm of unspecified site: Secondary | ICD-10-CM

## 2013-05-28 DIAGNOSIS — M79651 Pain in right thigh: Secondary | ICD-10-CM | POA: Diagnosis present

## 2013-05-28 DIAGNOSIS — Z7982 Long term (current) use of aspirin: Secondary | ICD-10-CM

## 2013-05-28 DIAGNOSIS — M79609 Pain in unspecified limb: Secondary | ICD-10-CM

## 2013-05-28 DIAGNOSIS — N2581 Secondary hyperparathyroidism of renal origin: Secondary | ICD-10-CM | POA: Diagnosis present

## 2013-05-28 DIAGNOSIS — I999 Unspecified disorder of circulatory system: Principal | ICD-10-CM | POA: Diagnosis present

## 2013-05-28 DIAGNOSIS — R791 Abnormal coagulation profile: Secondary | ICD-10-CM

## 2013-05-28 DIAGNOSIS — Z7902 Long term (current) use of antithrombotics/antiplatelets: Secondary | ICD-10-CM

## 2013-05-28 DIAGNOSIS — K5900 Constipation, unspecified: Secondary | ICD-10-CM | POA: Diagnosis not present

## 2013-05-28 DIAGNOSIS — I4891 Unspecified atrial fibrillation: Secondary | ICD-10-CM | POA: Diagnosis present

## 2013-05-28 DIAGNOSIS — I724 Aneurysm of artery of lower extremity: Secondary | ICD-10-CM | POA: Diagnosis present

## 2013-05-28 DIAGNOSIS — Z79899 Other long term (current) drug therapy: Secondary | ICD-10-CM

## 2013-05-28 DIAGNOSIS — I214 Non-ST elevation (NSTEMI) myocardial infarction: Secondary | ICD-10-CM | POA: Diagnosis present

## 2013-05-28 DIAGNOSIS — Z9861 Coronary angioplasty status: Secondary | ICD-10-CM

## 2013-05-28 DIAGNOSIS — E785 Hyperlipidemia, unspecified: Secondary | ICD-10-CM | POA: Diagnosis present

## 2013-05-28 DIAGNOSIS — Z992 Dependence on renal dialysis: Secondary | ICD-10-CM

## 2013-05-28 HISTORY — DX: Acute myocardial infarction, unspecified: I21.9

## 2013-05-28 LAB — CBC
HCT: 26.4 % — ABNORMAL LOW (ref 36.0–46.0)
MCH: 29.5 pg (ref 26.0–34.0)
MCV: 94 fL (ref 78.0–100.0)
Platelets: 399 10*3/uL (ref 150–400)
RBC: 2.81 MIL/uL — ABNORMAL LOW (ref 3.87–5.11)
RDW: 16.5 % — ABNORMAL HIGH (ref 11.5–15.5)
WBC: 11 10*3/uL — ABNORMAL HIGH (ref 4.0–10.5)

## 2013-05-28 LAB — BASIC METABOLIC PANEL
Calcium: 8.8 mg/dL (ref 8.4–10.5)
Chloride: 98 mEq/L (ref 96–112)
Creatinine, Ser: 3.98 mg/dL — ABNORMAL HIGH (ref 0.50–1.10)
GFR calc Af Amer: 11 mL/min — ABNORMAL LOW (ref >90)
GFR calc non Af Amer: 10 mL/min — ABNORMAL LOW (ref >90)
Sodium: 136 mEq/L (ref 135–145)

## 2013-05-28 MED ORDER — DOXERCALCIFEROL 4 MCG/2ML IV SOLN
3.5000 ug | INTRAVENOUS | Status: DC
  Filled 2013-05-28: qty 2

## 2013-05-28 MED ORDER — SORBITOL 70 % PO SOLN
30.0000 mL | Freq: Every evening | ORAL | Status: DC | PRN
  Administered 2013-06-01: 30 mL via ORAL
  Filled 2013-05-28 (×3): qty 30

## 2013-05-28 MED ORDER — SEVELAMER CARBONATE 800 MG PO TABS
2400.0000 mg | ORAL_TABLET | Freq: Three times a day (TID) | ORAL | Status: DC
  Administered 2013-05-29 – 2013-06-05 (×18): 2400 mg via ORAL
  Filled 2013-05-28 (×25): qty 3

## 2013-05-28 MED ORDER — TRAMADOL HCL 50 MG PO TABS
50.0000 mg | ORAL_TABLET | Freq: Four times a day (QID) | ORAL | Status: DC | PRN
Start: 2013-05-28 — End: 2013-06-05
  Administered 2013-05-29 – 2013-06-04 (×9): 50 mg via ORAL
  Filled 2013-05-28 (×10): qty 1

## 2013-05-28 MED ORDER — DARBEPOETIN ALFA-POLYSORBATE 100 MCG/0.5ML IJ SOLN
100.0000 ug | INTRAMUSCULAR | Status: DC
  Administered 2013-06-02: 100 ug via INTRAVENOUS
  Filled 2013-05-28: qty 0.5

## 2013-05-28 MED ORDER — FOLIC ACID 1 MG PO TABS
1.0000 mg | ORAL_TABLET | Freq: Every day | ORAL | Status: DC
  Administered 2013-05-29 – 2013-06-02 (×5): 1 mg via ORAL
  Filled 2013-05-28 (×6): qty 1

## 2013-05-28 MED ORDER — RENA-VITE PO TABS
1.0000 | ORAL_TABLET | Freq: Every day | ORAL | Status: DC
  Administered 2013-05-28 – 2013-06-03 (×7): 1 via ORAL
  Filled 2013-05-28 (×11): qty 1

## 2013-05-28 MED ORDER — METOPROLOL TARTRATE 25 MG PO TABS
25.0000 mg | ORAL_TABLET | Freq: Two times a day (BID) | ORAL | Status: DC
  Administered 2013-05-28 – 2013-06-05 (×13): 25 mg via ORAL
  Filled 2013-05-28 (×17): qty 1

## 2013-05-28 MED ORDER — ATORVASTATIN CALCIUM 40 MG PO TABS
40.0000 mg | ORAL_TABLET | Freq: Every day | ORAL | Status: DC
  Administered 2013-05-29 – 2013-06-05 (×7): 40 mg via ORAL
  Filled 2013-05-28 (×9): qty 1

## 2013-05-28 MED ORDER — ASPIRIN 81 MG PO TBEC
81.0000 mg | DELAYED_RELEASE_TABLET | Freq: Every day | ORAL | Status: DC
  Administered 2013-05-29 – 2013-06-05 (×7): 81 mg via ORAL
  Filled 2013-05-28 (×10): qty 1

## 2013-05-28 MED ORDER — AMIODARONE HCL 200 MG PO TABS
200.0000 mg | ORAL_TABLET | Freq: Every day | ORAL | Status: DC
  Administered 2013-05-29 – 2013-06-05 (×8): 200 mg via ORAL
  Filled 2013-05-28 (×9): qty 1

## 2013-05-28 MED ORDER — MORPHINE SULFATE 2 MG/ML IJ SOLN
2.0000 mg | INTRAMUSCULAR | Status: DC | PRN
  Administered 2013-05-30 (×2): 2 mg via INTRAVENOUS
  Filled 2013-05-28 (×5): qty 1

## 2013-05-28 MED ORDER — CLOPIDOGREL BISULFATE 75 MG PO TABS
75.0000 mg | ORAL_TABLET | Freq: Every day | ORAL | Status: DC
  Administered 2013-05-29 – 2013-06-05 (×7): 75 mg via ORAL
  Filled 2013-05-28 (×9): qty 1

## 2013-05-28 NOTE — Telephone Encounter (Signed)
See coumadin note. 

## 2013-05-28 NOTE — Progress Notes (Signed)
Date:  05/28/2013   ID:  [name], DOB 12-08-1927, MRN [id number]  PCP:  [name], MD  Primary Cardiologist:  Allyson Sabal     History of Present Illness: [name] is a 79 y.o. female with a history of end-stage renal disease on dialysis, hypertension, hyperlipidemia, paroxysmal atrial fibrillation, and moderate aortic valve stenosis. She presented with a non-ST segment elevation myocardial infarction. She is status post recent left arm fracture. On November 7 she underwent difficult but successful complex intervention to a severely calcified subtotal proximal and mid right coronary artery. She also had severe concomitant CAD of her circumflex vessel with total occlusion of the mid AV groove circumflex, severely calcified 90% ostial OM1 stenosis and 90% stenosis of the inferior branch of this marginal vessel.  She underwent HSRA, PTCA and stentingCirc/OMand marginal vessel on 05/19/13.  At discharge she was maintaining NSR at discharge.  She was instructed to take Amiodarone, 200mg  PO BID, for 7 days then change to 200mg  daily on 05/28/13.    She presents to for post hospital evaluation.  She has developed severe, 9/10, pain in the right groin in the setting of a supratherapeutic INR(6.9 on the 19th and 5.8 today).  She reports the pain when standing is severe.  The right LE is swollen as well and her left elbow where it was fractured is painful as well.    The patient currently denies nausea, vomiting, fever, chest pain, shortness of breath, orthopnea, dizziness, PND, cough, congestion, abdominal pain, hematochezia, melena, lower extremity edema, claudication.  Wt Readings from Last 3 Encounters:  05/28/13 117 lb (53.071 kg)  05/21/13 122 lb 5.7 oz (55.5 kg)  05/21/13 122 lb 5.7 oz (55.5 kg)     Past Medical History  Diagnosis Date  . Anemia 03/06/2011  . Hypertension   . Coronary artery disease   . Renal disorder   . Aortic stenosis, mild   . A-fib   . Acute CHF     Prior to Admission medications   Medication Sig Start Date End Date Taking? Authorizing Provider  amiodarone (PACERONE) 200 MG tablet Take 1 tablet (200 mg total) by mouth daily. 05/28/13  Yes Lonia Blood, MD  aspirin 81 MG EC tablet Take 1 tablet (81 mg total) by mouth daily. OVER THE COUNTER 05/22/13  Yes Lonia Blood, MD  atorvastatin (LIPITOR) 40 MG tablet Take 40 mg by mouth daily.     Yes Historical Provider, MD  clopidogrel (PLAVIX) 75 MG tablet Take 1 tablet (75 mg total) by mouth daily with breakfast. 05/22/13  Yes Lonia Blood, MD  darbepoetin (ARANESP) 100 MCG/0.5ML SOLN injection Inject 0.5 mLs (100 mcg total) into the vein every Wednesday with hemodialysis. 05/26/13  Yes Lonia Blood, MD  doxercalciferol (HECTOROL) 4 MCG/2ML injection Inject 1.75 mLs (3.5 mcg total) into the vein Every Tuesday,Thursday,and Saturday with dialysis. 05/22/13  Yes Lonia Blood, MD  folic acid (FOLVITE) 1 MG tablet Take 1 tablet (1 mg total) by mouth daily. 05/22/13  Yes Lonia Blood, MD  metoprolol tartrate (LOPRESSOR) 25 MG tablet Take 1 tablet (25 mg total) by mouth 2 (two) times daily. 05/22/13  Yes Lonia Blood, MD  multivitamin (RENA-VIT) TABS tablet Take 1 tablet by mouth at bedtime. 05/22/13  Yes Lonia Blood, MD  sevelamer carbonate (RENVELA) 800 MG tablet Take 2,400 mg by mouth 3 (three) times daily with meals.   Yes Historical Provider, MD  sorbitol 70 % solution  Take 30 mLs by mouth at bedtime as needed.   Yes Historical Provider, MD  traMADol (ULTRAM) 50 MG tablet Take 1 tablet (50 mg total) by mouth every 6 (six) hours as needed for moderate pain. 05/22/13  Yes Lonia Blood, MD  warfarin (COUMADIN) 6 MG tablet Take 1 half tablet (3mg ) daily or as directed by [facility] 05/22/13   Lonia Blood, MD    No current outpatient prescriptions on file.   No current facility-administered medications for this visit.    Allergies:   No Known  Allergies  Social History:  The patient  reports that she has never smoked. She has never used smokeless tobacco. She reports that she does not drink alcohol or use illicit drugs.   Family history:  No family history on file.  ROS:  Please see the history of present illness.  All other systems reviewed and negative.   PHYSICAL EXAM: VS:  BP 130/50  Pulse 71  Ht 5\' 4"  (1.626 m)  Wt 117 lb (53.071 kg)  BMI 20.07 kg/m2 Fraile, well developed, in no acute distress HEENT: Pupils are equal round react to light accommodation extraocular movements are intact.  Neck: no JVD Cardiac: Regular rate and rhythm with 2/6 sys murmur.  No rubs or gallops. Lungs:  clear to auscultation bilaterally, no wheezing, rhonchi or rales Ext: 1+ right lower extremity edema.  2+ radial and 0 dorsalis pedis pulses.  1+ PT.  2+ right femoral pulse. 1+ right popliteal.  Positive bruit in the right groin. Skin: warm and dry Neuro:  Grossly normal   EKG NSR 71 BPM  ASSESSMENT AND PLAN:  Problem List Items Addressed This Visit   Supratherapeutic INR   Pseudoaneurysm following procedure     The patient underwent limited vascular US in the office today due to severe right inner thigh pain and presenting large hematoma with bruit in the setting of supra-therapeutic INR.   When she was discharged her Hgb was 7.9.  I am concerned that it is much lower and she may need transfusion.  We will hold coumadin.  Repeat US when closer to the low side of therapeutic and attempt compression under US guidance.  May need vascular surgery consult.      PAF (paroxysmal atrial fibrillation)- Amiodarone - NSR (Chronic)     Maintaining NSR.  Will decrease amiodarone to 200mg  daily today.    Moderate aortic stenosis- EF 60-65% 05/08/13 (Chronic)   HTN (hypertension) (Chronic)   ESRD (end stage renal disease) on dialysis (Chronic)   CAD- RCA HSRA/DES 05/15/13-  CFX/OM1 DES 05/19/13 (Chronic)   Acute pain of right thigh    Other Visit  Diagnoses   Hematoma    -  Primary    Relevant Orders       EKG 12-Lead       Lower Extremity Arterial Pseudoaneurysm Right      [name] 4:08 PM

## 2013-05-28 NOTE — Progress Notes (Signed)
Right Lower Extremity Arterial Groin Duplex Completed. °Brianna L Mazza,RVT °

## 2013-05-28 NOTE — Assessment & Plan Note (Signed)
The patient underwent limited vascular US in the office today due to severe right inner thigh pain and presenting large hematoma with bruit in the setting of supra-therapeutic INR.   When she was discharged her Hgb was 7.9.  I am concerned that it is much lower and she may need transfusion.  We will hold coumadin.  Repeat US when closer to the low side of therapeutic and attempt compression under US guidance.  May need vascular surgery consult.

## 2013-05-28 NOTE — Telephone Encounter (Signed)
Returned call and pt verified x 2 w/ Dondra Spry, pt's daughter.  Stated pt's leg is very sore where they went in for the cath.  Stated leg is swollen w/o a knot at the site.  Pt c/o pain w/ moving leg.  Denied warmness or color changes.  Advised pt come in today for evaluation and appt on Monday will be cancelled.  Pt in background and agreed to come in today.  Daughter verbalized understanding and agreed w/ plan.  Appt rescheduled for today at 2pm.

## 2013-05-28 NOTE — Telephone Encounter (Signed)
Had procedure about a week ago and is having some issues  Leg very painful, having trouble moving leg and swollen leg. Please call.  Wants to know what they should do  She does have an appointment Monday with Wilburt Finlay

## 2013-05-28 NOTE — Assessment & Plan Note (Signed)
Maintaining NSR.  Will decrease amiodarone to 200mg  daily today.

## 2013-05-28 NOTE — H&P (Signed)
  Date:  05/28/2013   ID:  [name], DOB 01/29/1928, MRN [id number]  PCP:  HILL,GERALD K, MD  Primary Cardiologist:  Berry     History of Present Illness: [name] is a 79 y.o. female with a history of end-stage renal disease on dialysis, hypertension, hyperlipidemia, paroxysmal atrial fibrillation, and moderate aortic valve stenosis. She presented with a non-ST segment elevation myocardial infarction. She is status post recent left arm fracture. On November 7 she underwent difficult but successful complex intervention to a severely calcified subtotal proximal and mid right coronary artery. She also had severe concomitant CAD of her circumflex vessel with total occlusion of the mid AV groove circumflex, severely calcified 90% ostial OM1 stenosis and 90% stenosis of the inferior branch of this marginal vessel.  She underwent HSRA, PTCA and stentingCirc/OMand marginal vessel on 05/19/13.  At discharge she was maintaining NSR at discharge.  She was instructed to take Amiodarone, 200mg PO BID, for 7 days then change to 200mg daily on 05/28/13.    She presents to for post hospital evaluation.  She has developed severe, 9/10, pain in the right groin in the setting of a supratherapeutic INR(6.9 on the 19th and 5.8 today).  She reports the pain when standing is severe.  The right LE is swollen as well and her left elbow where it was fractured is painful as well.    The patient currently denies nausea, vomiting, fever, chest pain, shortness of breath, orthopnea, dizziness, PND, cough, congestion, abdominal pain, hematochezia, melena, lower extremity edema, claudication.  Wt Readings from Last 3 Encounters:  05/28/13 117 lb (53.071 kg)  05/21/13 122 lb 5.7 oz (55.5 kg)  05/21/13 122 lb 5.7 oz (55.5 kg)     Past Medical History  Diagnosis Date  . Anemia 03/06/2011  . Hypertension   . Coronary artery disease   . Renal disorder   . Aortic stenosis, mild   . A-fib   . Acute CHF     Prior to Admission medications   Medication Sig Start Date End Date Taking? Authorizing Provider  amiodarone (PACERONE) 200 MG tablet Take 1 tablet (200 mg total) by mouth daily. 05/28/13  Yes Jeffrey T McClung, MD  aspirin 81 MG EC tablet Take 1 tablet (81 mg total) by mouth daily. OVER THE COUNTER 05/22/13  Yes Jeffrey T McClung, MD  atorvastatin (LIPITOR) 40 MG tablet Take 40 mg by mouth daily.     Yes Historical Provider, MD  clopidogrel (PLAVIX) 75 MG tablet Take 1 tablet (75 mg total) by mouth daily with breakfast. 05/22/13  Yes Jeffrey T McClung, MD  darbepoetin (ARANESP) 100 MCG/0.5ML SOLN injection Inject 0.5 mLs (100 mcg total) into the vein every Wednesday with hemodialysis. 05/26/13  Yes Jeffrey T McClung, MD  doxercalciferol (HECTOROL) 4 MCG/2ML injection Inject 1.75 mLs (3.5 mcg total) into the vein Every Tuesday,Thursday,and Saturday with dialysis. 05/22/13  Yes Jeffrey T McClung, MD  folic acid (FOLVITE) 1 MG tablet Take 1 tablet (1 mg total) by mouth daily. 05/22/13  Yes Jeffrey T McClung, MD  metoprolol tartrate (LOPRESSOR) 25 MG tablet Take 1 tablet (25 mg total) by mouth 2 (two) times daily. 05/22/13  Yes Jeffrey T McClung, MD  multivitamin (RENA-VIT) TABS tablet Take 1 tablet by mouth at bedtime. 05/22/13  Yes Jeffrey T McClung, MD  sevelamer carbonate (RENVELA) 800 MG tablet Take 2,400 mg by mouth 3 (three) times daily with meals.   Yes Historical Provider, MD  sorbitol 70 % solution   Take 30 mLs by mouth at bedtime as needed.   Yes Historical Provider, MD  traMADol (ULTRAM) 50 MG tablet Take 1 tablet (50 mg total) by mouth every 6 (six) hours as needed for moderate pain. 05/22/13  Yes Jeffrey T McClung, MD  warfarin (COUMADIN) 6 MG tablet Take 1 half tablet (3mg) daily or as directed by [facility] 05/22/13   Jeffrey T McClung, MD    No current outpatient prescriptions on file.   No current facility-administered medications for this visit.    Allergies:   No Known  Allergies  Social History:  The patient  reports that she has never smoked. She has never used smokeless tobacco. She reports that she does not drink alcohol or use illicit drugs.   Family history:  No family history on file.  ROS:  Please see the history of present illness.  All other systems reviewed and negative.   PHYSICAL EXAM: VS:  BP 130/50  Pulse 71  Ht 5' 4" (1.626 m)  Wt 117 lb (53.071 kg)  BMI 20.07 kg/m2 Fraile, well developed, in no acute distress HEENT: Pupils are equal round react to light accommodation extraocular movements are intact.  Neck: no JVD Cardiac: Regular rate and rhythm with 2/6 sys murmur.  No rubs or gallops. Lungs:  clear to auscultation bilaterally, no wheezing, rhonchi or rales Ext: 1+ right lower extremity edema.  2+ radial and 0 dorsalis pedis pulses.  1+ PT.  2+ right femoral pulse. 1+ right popliteal.  Positive bruit in the right groin. Skin: warm and dry Neuro:  Grossly normal   EKG NSR 71 BPM  ASSESSMENT AND PLAN:  Problem List Items Addressed This Visit   Supratherapeutic INR   Pseudoaneurysm following procedure     The patient underwent limited vascular US in the office today due to severe right inner thigh pain and presenting large hematoma with bruit in the setting of supra-therapeutic INR.   When she was discharged her Hgb was 7.9.  I am concerned that it is much lower and she may need transfusion.  We will hold coumadin.  Repeat US when closer to the low side of therapeutic and attempt compression under US guidance.  May need vascular surgery consult.      PAF (paroxysmal atrial fibrillation)- Amiodarone - NSR (Chronic)     Maintaining NSR.  Will decrease amiodarone to 200mg daily today.    Moderate aortic stenosis- EF 60-65% 05/08/13 (Chronic)   HTN (hypertension) (Chronic)   ESRD (end stage renal disease) on dialysis (Chronic)   CAD- RCA HSRA/DES 05/15/13-  CFX/OM1 DES 05/19/13 (Chronic)   Acute pain of right thigh    Other Visit  Diagnoses   Hematoma    -  Primary    Relevant Orders       EKG 12-Lead       Lower Extremity Arterial Pseudoaneurysm Right      Tadeusz Stahl 4:08 PM     

## 2013-05-28 NOTE — H&P (Signed)
Pt. Seen and examined. Agree with the NP/PA-C note as written.  79 yo female with ESRD on HD, severely calcified vessels, moderate AS and paroxysmal a-fib on warfarin.  She was recently hospitalized with confusion thought secondary to medications, but ultimately was found to have a non-ST segment MI. Just had a fall with arm fracture continues to have pain. She ultimately underwent cardiac catheterization which demonstrated severe calcific disease of the OM1 and right coronary artery. She subsequently underwent rotablation and stenting of the right coronary followed in a staged manner by rotablation and stenting of the circumflex marginal vessel. She was noted to be high normal with regards to her INR on discharge and post hospital evaluation indicated the INR was 6.9. A repeat INR check today is 5.8 and she reports severe right groin pain. The right groin does not demonstrate any ecchymosis however is enlarged and tender. There is evidence for pseudoaneurysm with a poorly visualized neck which may be quite wide. Based on these findings, I feel that she should be admitted for pain control and evaluation for significant anemia. Also recommend vascular surgery consult as this may need surgical repair.  Chrystie Nose, MD, [facility] Attending Cardiologist [facility]

## 2013-05-29 DIAGNOSIS — I10 Essential (primary) hypertension: Secondary | ICD-10-CM

## 2013-05-29 DIAGNOSIS — M79609 Pain in unspecified limb: Secondary | ICD-10-CM

## 2013-05-29 DIAGNOSIS — D649 Anemia, unspecified: Secondary | ICD-10-CM

## 2013-05-29 DIAGNOSIS — Z7901 Long term (current) use of anticoagulants: Secondary | ICD-10-CM

## 2013-05-29 DIAGNOSIS — N186 End stage renal disease: Secondary | ICD-10-CM

## 2013-05-29 DIAGNOSIS — I4891 Unspecified atrial fibrillation: Secondary | ICD-10-CM

## 2013-05-29 DIAGNOSIS — I509 Heart failure, unspecified: Secondary | ICD-10-CM

## 2013-05-29 DIAGNOSIS — I359 Nonrheumatic aortic valve disorder, unspecified: Secondary | ICD-10-CM

## 2013-05-29 DIAGNOSIS — I729 Aneurysm of unspecified site: Secondary | ICD-10-CM

## 2013-05-29 DIAGNOSIS — I2510 Atherosclerotic heart disease of native coronary artery without angina pectoris: Secondary | ICD-10-CM

## 2013-05-29 DIAGNOSIS — R791 Abnormal coagulation profile: Secondary | ICD-10-CM

## 2013-05-29 LAB — PROTIME-INR
INR: 5.24 (ref 0.00–1.49)
Prothrombin Time: 46 seconds — ABNORMAL HIGH (ref 11.6–15.2)

## 2013-05-29 LAB — CBC
Hemoglobin: 7.6 g/dL — ABNORMAL LOW (ref 12.0–15.0)
MCH: 30.4 pg (ref 26.0–34.0)
MCHC: 32.1 g/dL (ref 30.0–36.0)
Platelets: 374 10*3/uL (ref 150–400)
WBC: 10 10*3/uL (ref 4.0–10.5)

## 2013-05-29 NOTE — Progress Notes (Signed)
Subjective: While in bed pain not severe.  Standing has the most pain.  Objective: Vital signs in last 24 hours: Temp:  [97.5 F (36.4 C)-97.8 F (36.6 C)] 97.7 F (36.5 C) (11/22 0604) Pulse Rate:  [65-71] 66 (11/22 0604) Resp:  [18] 18 (11/22 0604) BP: (112-130)/(48-74) 119/48 mmHg (11/22 0604) SpO2:  [98 %-100 %] 98 % (11/22 0604) Weight:  [117 lb (53.071 kg)-118 lb 6.2 oz (53.7 kg)] 118 lb 6.2 oz (53.7 kg) (11/22 0604) Weight change:  Last BM Date: 05/26/13 Intake/Output from previous day:   Intake/Output this shift:    PE: General:Pleasant affect but flat, NAD Skin:Warm and dry, brisk capillary refill HEENT:normocephalic, sclera clear, mucus membranes moist Neck:supple, no JVD, no bruits  Heart:S1S2 RRR with 2/6 systolic murmur, no gallup, rub or click Lungs:clear without rales, rhonchi, or wheezes ZOX:WRUE, non tender, + BS, do not palpate liver spleen or masses Ext:no lower ext edema, 2+ pedal pulses, 2+ radial pulses, rt arm in splint, femoral artery Rt no hematoma, + strong femoral pulse + bruit. Neuro:alert and oriented, MAE, follows commands, + facial symmetry   Lab Results:  Recent Labs  05/28/13 1831 05/29/13 0520  WBC 11.0* 10.0  HGB 8.3* 7.6*  HCT 26.4* 23.7*  PLT 399 374   BMET  Recent Labs  05/28/13 1831  NA 136  K 3.9  CL 98  CO2 28  GLUCOSE 72  BUN 12  CREATININE 3.98*  CALCIUM 8.8   No results found for this basename: TROPONINI, CK, MB,  in the last 72 hours  Lab Results  Component Value Date   CHOL 220* 11/10/2012   HDL 95 11/10/2012   LDLCALC 454* 11/10/2012   TRIG 122 11/10/2012   CHOLHDL 2.3 11/10/2012   No results found for this basename: HGBA1C     Lab Results  Component Value Date   TSH 5.516* 05/07/2013     EKG: Orders placed in visit on 05/28/13  . EKG 12-LEAD    Studies/Results: No results found.  Medications: I have reviewed the patient's current medications. Scheduled Meds: . amiodarone  200 mg  Oral Daily  . aspirin  81 mg Oral Daily  . atorvastatin  40 mg Oral Daily  . clopidogrel  75 mg Oral Q breakfast  . [START ON 06/02/2013] darbepoetin  100 mcg Intravenous Q Wed-HD  . doxercalciferol  3.5 mcg Intravenous Q T,Th,Sa-HD  . folic acid  1 mg Oral Daily  . metoprolol tartrate  25 mg Oral BID  . multivitamin  1 tablet Oral QHS  . sevelamer carbonate  2,400 mg Oral TID WC   Continuous Infusions:  PRN Meds:.morphine injection, sorbitol, traMADol  Assessment/Plan: Principal Problem:   Pseudoaneurysm following procedure Active Problems:   Anemia   HTN (hypertension)   ESRD (end stage renal disease) on dialysis   Moderate aortic stenosis- EF 60-65% 05/08/13   PAF (paroxysmal atrial fibrillation)- Amiodarone - NSR   CAD- RCA HSRA/DES 05/15/13-  CFX/OM1 DES 05/19/13   Acute pain of right thigh   Supratherapeutic INR  PLAN: Gradual decrease in H/H now 7.6/23,   Family states all she wants to do is sleep- ? transfuse, INR remains elevated at 5.24.  Groin tender at times, worse with standing.  On Plavix ASA, and coumadin- coumadin on hold. ESRD with HD M-W-F  per Dr. Rennis Golden "pseudoaneurysm with a poorly visualized neck which may be quite wide. Based on these findings, I feel that she should be  admitted for pain control and evaluation for significant anemia. Also recommend vascular surgery consult as this may need surgical repair."  Will have pt up in recliner.      LOS: 1 day   Time spent with pt. :15 minutes. [facility] R  Nurse Practitioner Certified Pager [phone] 05/29/2013, 12:00 PM   Agree with note written by [name] RNP  Admitted from office with RCFA pseudoaneurysm. Has had 3 recent femoral procedures (1 diagnostic cath and 2 HSRA). On Coumadin for PAF, DAPT and Amio. INR was supra therapeutic (>5). She has a bruit on exam. Duplex didn't adequately visualize neck. Will let INR drift down and attempt US guided compression first prior to recommending surgical  correction. Needs HD Monday.    Runell Gess 05/29/2013 1:46 PM

## 2013-05-30 LAB — BASIC METABOLIC PANEL
BUN: 28 mg/dL — ABNORMAL HIGH (ref 6–23)
CO2: 25 mEq/L (ref 19–32)
Chloride: 96 mEq/L (ref 96–112)
GFR calc Af Amer: 7 mL/min — ABNORMAL LOW (ref >90)
Potassium: 3.7 mEq/L (ref 3.5–5.1)
Sodium: 134 mEq/L — ABNORMAL LOW (ref 135–145)

## 2013-05-30 LAB — CBC
HCT: 24.6 % — ABNORMAL LOW (ref 36.0–46.0)
Hemoglobin: 7.9 g/dL — ABNORMAL LOW (ref 12.0–15.0)
RBC: 2.62 MIL/uL — ABNORMAL LOW (ref 3.87–5.11)
WBC: 11.4 10*3/uL — ABNORMAL HIGH (ref 4.0–10.5)

## 2013-05-30 LAB — PROTIME-INR
INR: 4.08 — ABNORMAL HIGH (ref 0.00–1.49)
Prothrombin Time: 38 seconds — ABNORMAL HIGH (ref 11.6–15.2)

## 2013-05-30 MED ORDER — DOXERCALCIFEROL 4 MCG/2ML IV SOLN
3.5000 ug | INTRAVENOUS | Status: DC
  Administered 2013-05-31 – 2013-06-04 (×3): 3.5 ug via INTRAVENOUS
  Filled 2013-05-30 (×3): qty 2

## 2013-05-30 NOTE — Progress Notes (Signed)
Subjective:  C/O L arm pain (in a cast) and R groin pain. No CP/SOB  Objective:  Temp:  [98 F (36.7 C)-98.7 F (37.1 C)] 98 F (36.7 C) (11/23 0448) Pulse Rate:  [63-72] 63 (11/23 0924) Resp:  [18] 18 (11/23 0448) BP: (116-132)/(41-55) 125/46 mmHg (11/23 0924) SpO2:  [99 %-100 %] 99 % (11/23 0448) Weight:  [125 lb (56.7 kg)] 125 lb (56.7 kg) (11/23 0448) Weight change: 6 lb 9.8 oz (3 kg)  Intake/Output from previous day: 11/22 0701 - 11/23 0700 In: 240 [P.O.:240] Out: -   Intake/Output from this shift: Total I/O In: 200 [P.O.:200] Out: -   Physical Exam: General appearance: alert and no distress Neck: no adenopathy, no carotid bruit, no JVD, supple, symmetrical, trachea midline and thyroid not enlarged, symmetric, no tenderness/mass/nodules Lungs: clear to auscultation bilaterally Heart: regular rate and rhythm, S1, S2 normal, no murmur, click, rub or gallop Extremities: extremities normal, atraumatic, no cyanosis or edema  Lab Results: Results for orders placed during the hospital encounter of 05/28/13 (from the past 48 hour(s))  BASIC METABOLIC PANEL     Status: Abnormal   Collection Time    05/28/13  6:31 PM      Result Value Range   Sodium 136  135 - 145 mEq/L   Potassium 3.9  3.5 - 5.1 mEq/L   Chloride 98  96 - 112 mEq/L   CO2 28  19 - 32 mEq/L   Glucose, Bld 72  70 - 99 mg/dL   BUN 12  6 - 23 mg/dL   Creatinine, Ser 5.78 (*) 0.50 - 1.10 mg/dL   Calcium 8.8  8.4 - 46.9 mg/dL   GFR calc non Af Amer 10 (*) >90 mL/min   GFR calc Af Amer 11 (*) >90 mL/min   Comment: (NOTE)     The eGFR has been calculated using the CKD EPI equation.     This calculation has not been validated in all clinical situations.     eGFR's persistently <90 mL/min signify possible Chronic Kidney     Disease.  CBC     Status: Abnormal   Collection Time    05/28/13  6:31 PM      Result Value Range   WBC 11.0 (*) 4.0 - 10.5 K/uL   RBC 2.81 (*) 3.87 - 5.11 MIL/uL   Hemoglobin  8.3 (*) 12.0 - 15.0 g/dL   HCT 62.9 (*) 52.8 - 41.3 %   MCV 94.0  78.0 - 100.0 fL   MCH 29.5  26.0 - 34.0 pg   MCHC 31.4  30.0 - 36.0 g/dL   RDW 24.4 (*) 01.0 - 27.2 %   Platelets 399  150 - 400 K/uL  CBC     Status: Abnormal   Collection Time    05/29/13  5:20 AM      Result Value Range   WBC 10.0  4.0 - 10.5 K/uL   RBC 2.50 (*) 3.87 - 5.11 MIL/uL   Hemoglobin 7.6 (*) 12.0 - 15.0 g/dL   HCT 53.6 (*) 64.4 - 03.4 %   MCV 94.8  78.0 - 100.0 fL   MCH 30.4  26.0 - 34.0 pg   MCHC 32.1  30.0 - 36.0 g/dL   RDW 74.2 (*) 59.5 - 63.8 %   Platelets 374  150 - 400 K/uL  PROTIME-INR     Status: Abnormal   Collection Time    05/29/13  5:20 AM  Result Value Range   Prothrombin Time 46.0 (*) 11.6 - 15.2 seconds   INR 5.24 (*) 0.00 - 1.49   Comment: CRITICAL RESULT CALLED TO, READ BACK BY AND VERIFIED WITH:     L.MAKRIS,RN [id number] 05/29/13 EHOWARD  CBC     Status: Abnormal   Collection Time    05/30/13  4:56 AM      Result Value Range   WBC 11.4 (*) 4.0 - 10.5 K/uL   RBC 2.62 (*) 3.87 - 5.11 MIL/uL   Hemoglobin 7.9 (*) 12.0 - 15.0 g/dL   HCT 40.9 (*) 81.1 - 91.4 %   MCV 93.9  78.0 - 100.0 fL   MCH 30.2  26.0 - 34.0 pg   MCHC 32.1  30.0 - 36.0 g/dL   RDW 78.2 (*) 95.6 - 21.3 %   Platelets 431 (*) 150 - 400 K/uL  PROTIME-INR     Status: Abnormal   Collection Time    05/30/13  4:56 AM      Result Value Range   Prothrombin Time 38.0 (*) 11.6 - 15.2 seconds   INR 4.08 (*) 0.00 - 1.49  BASIC METABOLIC PANEL     Status: Abnormal   Collection Time    05/30/13  4:56 AM      Result Value Range   Sodium 134 (*) 135 - 145 mEq/L   Potassium 3.7  3.5 - 5.1 mEq/L   Chloride 96  96 - 112 mEq/L   CO2 25  19 - 32 mEq/L   Glucose, Bld 75  70 - 99 mg/dL   BUN 28 (*) 6 - 23 mg/dL   Comment: DELTA CHECK NOTED   Creatinine, Ser 5.90 (*) 0.50 - 1.10 mg/dL   Calcium 8.9  8.4 - 08.6 mg/dL   GFR calc non Af Amer 6 (*) >90 mL/min   GFR calc Af Amer 7 (*) >90 mL/min   Comment: (NOTE)     The eGFR  has been calculated using the CKD EPI equation.     This calculation has not been validated in all clinical situations.     eGFR's persistently <90 mL/min signify possible Chronic Kidney     Disease.    Imaging: Imaging results have been reviewed  Assessment/Plan:   1. Principal Problem: 2.   Pseudoaneurysm following procedure 3. Active Problems: 4.   Anemia 5.   HTN (hypertension) 6.   ESRD (end stage renal disease) on dialysis 7.   Moderate aortic stenosis- EF 60-65% 05/08/13 8.   PAF (paroxysmal atrial fibrillation)- Amiodarone - NSR 9.   CAD- RCA HSRA/DES 05/15/13-  CFX/OM1 DES 05/19/13 10.   Acute pain of right thigh 11.   Supratherapeutic INR 12.   Time Spent Directly with Patient:  20 minutes  Length of Stay:  LOS: 2 days   INR still supra therapeutic (>4). With recent stents I am hesitant to give Vit K. On DAPT. For HD tomorrow. She would benefit from Tx 2u PRBCs. There is no benefit from US guided compression until her INR normalizes. She is in NSR. Coumadin A/C on hold.   Runell Gess 05/30/2013, 11:09 AM

## 2013-05-30 NOTE — Consult Note (Signed)
[facility] Renal Consultation Note    Indication for Consultation:  Management of ESRD/hemodialysis; anemia, hypertension/volume and secondary hyperparathyroidism  HPI: [name] is a 79 y.o. female ESRD patient with a complex medical history and recent left arm fracture who was hospitalized 10/31-11/15 for altered mental status and was subsequently found to have an NSTEMI. She underwent a cardiac catheterization and now presents with pseudoaneurysm to her right groin in the setting of a supra-therapeutic INR. Hemoglobin has trended down to 7.9. At the time of this encounter she is resting in bed with left arm pain and right groin soreness. She denies HA dizziness, nausea, vomiting or diarrhea. Family is present.  Past Medical History  Diagnosis Date  . Anemia 03/06/2011  . Hypertension   . Coronary artery disease   . Renal disorder   . Aortic stenosis, mild   . A-fib   . Acute CHF   . Myocardial infarction    Past Surgical History  Procedure Laterality Date  . Av fistula placement      L arm  . Other surgical history Bilateral     Surgery on both arms.   . Fracture surgery  Bil. Arms  . Cardiac catheterization     History reviewed. No pertinent family history. Social History:  reports that she has never smoked. She has never used smokeless tobacco. She reports that she does not drink alcohol or use illicit drugs. No Known Allergies Prior to Admission medications   Medication Sig Start Date End Date Taking? Authorizing Provider  amiodarone (PACERONE) 200 MG tablet Take 1 tablet (200 mg total) by mouth daily. 05/28/13  Yes Lonia Blood, MD  aspirin 81 MG EC tablet Take 1 tablet (81 mg total) by mouth daily. OVER THE COUNTER 05/22/13  Yes Lonia Blood, MD  atorvastatin (LIPITOR) 40 MG tablet Take 40 mg by mouth daily.     Yes Historical Provider, MD  clopidogrel (PLAVIX) 75 MG tablet Take 1 tablet (75 mg total) by mouth daily with breakfast. 05/22/13   Yes Lonia Blood, MD  darbepoetin (ARANESP) 100 MCG/0.5ML SOLN injection Inject 100 mcg into the skin See admin instructions. Takes every Monday, Wednesday and Friday with dialysis   Yes Historical Provider, MD  doxercalciferol (HECTOROL) 4 MCG/2ML injection Inject 3.5 mcg into the vein every Monday, Wednesday, and Friday with hemodialysis.   Yes Historical Provider, MD  folic acid (FOLVITE) 1 MG tablet Take 1 tablet (1 mg total) by mouth daily. 05/22/13  Yes Lonia Blood, MD  metoprolol tartrate (LOPRESSOR) 25 MG tablet Take 1 tablet (25 mg total) by mouth 2 (two) times daily. 05/22/13  Yes Lonia Blood, MD  multivitamin (RENA-VIT) TABS tablet Take 1 tablet by mouth at bedtime. 05/22/13  Yes Lonia Blood, MD  sevelamer carbonate (RENVELA) 800 MG tablet Take 2,400 mg by mouth 3 (three) times daily with meals.   Yes Historical Provider, MD  sorbitol 70 % solution Take 30 mLs by mouth at bedtime as needed.   Yes Historical Provider, MD  traMADol (ULTRAM) 50 MG tablet Take 1 tablet (50 mg total) by mouth every 6 (six) hours as needed for moderate pain. 05/22/13  Yes Lonia Blood, MD  warfarin (COUMADIN) 6 MG tablet Take 1 half tablet (3mg ) daily or as directed by [facility] 05/22/13  Yes Lonia Blood, MD   Current Facility-Administered Medications  Medication Dose Route Frequency Provider Last Rate Last Dose  . amiodarone (PACERONE) tablet 200 mg  200 mg Oral Daily Wilburt Finlay, PA-C   200 mg at 05/30/13 0947  . aspirin EC tablet 81 mg  81 mg Oral Daily Wilburt Finlay, PA-C   81 mg at 05/30/13 0947  . atorvastatin (LIPITOR) tablet 40 mg  40 mg Oral Daily Wilburt Finlay, PA-C   40 mg at 05/30/13 0946  . clopidogrel (PLAVIX) tablet 75 mg  75 mg Oral Q breakfast Wilburt Finlay, PA-C   75 mg at 05/30/13 0752  . [START ON 06/02/2013] darbepoetin (ARANESP) injection 100 mcg  100 mcg Intravenous Q Wed-HD Wilburt Finlay, PA-C      . [START ON 05/31/2013] doxercalciferol (HECTOROL)  injection 3.5 mcg  3.5 mcg Intravenous Q M,W,F-HD Kerin Salen, PA-C      . folic acid (FOLVITE) tablet 1 mg  1 mg Oral Daily Wilburt Finlay, PA-C   1 mg at 05/30/13 0946  . metoprolol tartrate (LOPRESSOR) tablet 25 mg  25 mg Oral BID Wilburt Finlay, PA-C   25 mg at 05/30/13 0946  . morphine 2 MG/ML injection 2 mg  2 mg Intravenous Q4H PRN Wilburt Finlay, PA-C   2 mg at 05/30/13 1455  . multivitamin (RENA-VIT) tablet 1 tablet  1 tablet Oral QHS Wilburt Finlay, PA-C   1 tablet at 05/29/13 2130  . sevelamer carbonate (RENVELA) tablet 2,400 mg  2,400 mg Oral TID WC Wilburt Finlay, PA-C   2,400 mg at 05/30/13 1211  . sorbitol 70 % solution 30 mL  30 mL Oral QHS PRN Wilburt Finlay, PA-C      . traMADol Janean Sark) tablet 50 mg  50 mg Oral Q6H PRN Wilburt Finlay, PA-C   50 mg at 05/30/13 1025   Labs: Basic Metabolic Panel:  Recent Labs Lab 05/28/13 1831 05/30/13 0456  NA 136 134*  K 3.9 3.7  CL 98 96  CO2 28 25  GLUCOSE 72 75  BUN 12 28*  CREATININE 3.98* 5.90*  CALCIUM 8.8 8.9   CBC:  Recent Labs Lab 05/28/13 1831 05/29/13 0520 05/30/13 0456  WBC 11.0* 10.0 11.4*  HGB 8.3* 7.6* 7.9*  HCT 26.4* 23.7* 24.6*  MCV 94.0 94.8 93.9  PLT 399 374 431*   No results found.  ROS: Left arm pain, right groin soreness. 10 pt ROS asked and answered. All systems negative except as above.   Physical Exam: Filed Vitals:   05/29/13 2124 05/30/13 0448 05/30/13 0924 05/30/13 1402  BP: 132/41 131/55 125/46 122/37  Pulse: 72 71 63 67  Temp: 98.7 F (37.1 C) 98 F (36.7 C)  98.1 F (36.7 C)  TempSrc: Oral Oral  Oral  Resp: 18 18  16   Height:      Weight:  56.7 kg (125 lb)    SpO2: 100% 99%  99%     General: Elderly, frail, cooperative. NAD. Head: Normocephalic, atraumatic, sclera non-icteric, mucus membranes are moist Neck: Supple. JVD not elevated. Lungs: Clear bilaterally to auscultation without wheezes, rales, or rhonchi. Breathing is unlabored. Heart: RRR with S1 S2. No murmurs, rubs, or gallops  appreciated. Abdomen: Soft, non-tender, non-distended with normoactive bowel sounds. No rebound/guarding. No obvious abdominal masses. M-S:  Strength and tone appear normal for age. Extremities: Left arm splinted. Rt groin tender with trace ecchymosis to inner thigh. No without edema or ischemic changes Neuro: Alert and oriented X 3. Moves all extremities spontaneously. Psych:  Responds to questions appropriately with a normal affect. Dialysis Access: LUA AVF + bruit  Dialysis Orders: MWF [facility] (from 10/31 admission -  Request records in a.m.) 3hrs 2K/2.5CaLUA AVF (buttonhole using 15G needles, both up) Heparin none 400/600 Max UF 1.5/hr  EPO 2200 biw Hect 3.5ug tiw  EDW ~ 53-54 kg per Dr. Lily Lovings notes from last admit.   Assessment/Plan: 1. Pseudoaneurysm following procedure - Mgmt per cardiology. Compression planned once INR normalizes. 2. Supratherapeutic INR - Mgmt per cardiology. Coumadin on hold. 3. ESRD -  MWF, [facility]. HD tomorrow. Request records 4. Hypertension/volume  - SBPs 120s. On metoprolol. Actual edw unclear. Last note prior to most recent d/c puts edw around 53-54kg. UF 2-3L as tolerated. Review op records. 5. Anemia  - Hgb 7.9 < 8.3. Aranesp 100 q Wed on op schedule. Repeat CBC. Transfuse for hgb <8. 6. Metabolic bone disease -  Ca 8.9. Continue binders and Vit D pending receipt of records. Renal panel 7. Nutrition - Renal diet, multivitamin 8. PAF - NSR on tele. On amiodarone. 9. Left arm fx - Still with pain, worse with new splint. ? Ortho eval vs op follow-up  Claud Kelp, Cordelia Poche [facility] Pager [phone] 05/30/2013, 4:20 PM   I have seen and examined patient, discussed with PA and agree with assessment and plan as outlined above. Vinson Moselle MD pager [phone]    cell [phone] 05/30/2013, 5:57 PM

## 2013-05-30 NOTE — Progress Notes (Signed)
Subjective: With arm pain more today.    Objective: Vital signs in last 24 hours: Temp:  [98 F (36.7 C)-98.7 F (37.1 C)] 98 F (36.7 C) (11/23 0448) Pulse Rate:  [63-72] 63 (11/23 0924) Resp:  [18] 18 (11/23 0448) BP: (116-132)/(41-55) 125/46 mmHg (11/23 0924) SpO2:  [99 %-100 %] 99 % (11/23 0448) Weight:  [125 lb (56.7 kg)] 125 lb (56.7 kg) (11/23 0448) Weight change: 6 lb 9.8 oz (3 kg) Last BM Date: 05/26/13 Intake/Output from previous day: 11/22 0701 - 11/23 0700 In: 240 [P.O.:240] Out: -  Intake/Output this shift: Total I/O In: 200 [P.O.:200] Out: -   PE: General:Pleasant affect, though obviously having pain in her arm. NAD Skin:Warm and dry, brisk capillary refill HEENT:normocephalic, sclera clear, mucus membranes moist Heart:S1S2 RRR with2-3/6 systolic murmur, no gallup, rub or click Lungs:with few crackles, no rhonchi, or wheezes RUE:AVWU, non tender, + BS, do not palpate liver spleen or masses Ext:no lower ext edema,  Rt groin still tender  Neuro:alert and oriented, MAE, follows commands, + facial symmetry   Lab Results:  Recent Labs  05/29/13 0520 05/30/13 0456  WBC 10.0 11.4*  HGB 7.6* 7.9*  HCT 23.7* 24.6*  PLT 374 431*   BMET  Recent Labs  05/28/13 1831 05/30/13 0456  NA 136 134*  K 3.9 3.7  CL 98 96  CO2 28 25  GLUCOSE 72 75  BUN 12 28*  CREATININE 3.98* 5.90*  CALCIUM 8.8 8.9   No results found for this basename: TROPONINI, CK, MB,  in the last 72 hours  Lab Results  Component Value Date   CHOL 220* 11/10/2012   HDL 95 11/10/2012   LDLCALC 981* 11/10/2012   TRIG 122 11/10/2012   CHOLHDL 2.3 11/10/2012   No results found for this basename: HGBA1C     Lab Results  Component Value Date   TSH 5.516* 05/07/2013      EKG: Orders placed in visit on 05/28/13  . EKG 12-LEAD    Studies/Results: No results found.  Medications: I have reviewed the patient's current medications. Scheduled Meds: . amiodarone  200 mg Oral  Daily  . aspirin  81 mg Oral Daily  . atorvastatin  40 mg Oral Daily  . clopidogrel  75 mg Oral Q breakfast  . [START ON 06/02/2013] darbepoetin  100 mcg Intravenous Q Wed-HD  . doxercalciferol  3.5 mcg Intravenous Q T,Th,Sa-HD  . folic acid  1 mg Oral Daily  . metoprolol tartrate  25 mg Oral BID  . multivitamin  1 tablet Oral QHS  . sevelamer carbonate  2,400 mg Oral TID WC   Continuous Infusions:  PRN Meds:.morphine injection, sorbitol, traMADol  Assessment/Plan: Principal Problem:   Pseudoaneurysm following procedure Active Problems:   Anemia   HTN (hypertension)   ESRD (end stage renal disease) on dialysis   Moderate aortic stenosis- EF 60-65% 05/08/13   PAF (paroxysmal atrial fibrillation)- Amiodarone - NSR   CAD- RCA HSRA/DES 05/15/13-  CFX/OM1 DES 05/19/13   Acute pain of right thigh   Supratherapeutic INR   Plan:  Doppler in am to re- eval  pseudo.  May then proceed with compression depending on the size of neck if it is visable.   Will consult with renal for HD tomorrow.  LOS: 2 days   Time spent with pt. :15 minutes. [facility] R  Nurse Practitioner Certified Pager [phone] 05/30/2013, 10:44 AM   .Agree with note written by [name]  RNP [name] 05/30/2013 1:01 PM

## 2013-05-30 NOTE — Progress Notes (Signed)
Assisted patient OOB to chair per MD order per hospital policy. Patient tolerated well. Will continue to monitor. Lajuana Matte, RN

## 2013-05-31 ENCOUNTER — Ambulatory Visit: Payer: Medicare Other | Admitting: Physician Assistant

## 2013-05-31 DIAGNOSIS — I724 Aneurysm of artery of lower extremity: Secondary | ICD-10-CM

## 2013-05-31 LAB — RENAL FUNCTION PANEL
CO2: 24 mEq/L (ref 19–32)
Calcium: 9 mg/dL (ref 8.4–10.5)
Chloride: 93 mEq/L — ABNORMAL LOW (ref 96–112)
Creatinine, Ser: 7.58 mg/dL — ABNORMAL HIGH (ref 0.50–1.10)
GFR calc Af Amer: 5 mL/min — ABNORMAL LOW (ref >90)
GFR calc non Af Amer: 4 mL/min — ABNORMAL LOW (ref >90)
Glucose, Bld: 93 mg/dL (ref 70–99)
Potassium: 4 mEq/L (ref 3.5–5.1)
Sodium: 132 mEq/L — ABNORMAL LOW (ref 135–145)

## 2013-05-31 LAB — CBC
HCT: 23.6 % — ABNORMAL LOW (ref 36.0–46.0)
Hemoglobin: 7.6 g/dL — ABNORMAL LOW (ref 12.0–15.0)
Hemoglobin: 7.8 g/dL — ABNORMAL LOW (ref 12.0–15.0)
MCH: 29.8 pg (ref 26.0–34.0)
MCH: 30 pg (ref 26.0–34.0)
MCHC: 32.2 g/dL (ref 30.0–36.0)
MCV: 90.8 fL (ref 78.0–100.0)
Platelets: 322 10*3/uL (ref 150–400)
RBC: 2.55 MIL/uL — ABNORMAL LOW (ref 3.87–5.11)
RBC: 2.6 MIL/uL — ABNORMAL LOW (ref 3.87–5.11)
WBC: 10.4 10*3/uL (ref 4.0–10.5)

## 2013-05-31 LAB — PROTIME-INR
INR: 3.91 — ABNORMAL HIGH (ref 0.00–1.49)
Prothrombin Time: 36.8 seconds — ABNORMAL HIGH (ref 11.6–15.2)

## 2013-05-31 LAB — PREPARE RBC (CROSSMATCH)

## 2013-05-31 MED ORDER — OXYCODONE-ACETAMINOPHEN 5-325 MG PO TABS
1.0000 | ORAL_TABLET | Freq: Four times a day (QID) | ORAL | Status: DC | PRN
  Administered 2013-05-31 – 2013-06-04 (×8): 1 via ORAL
  Filled 2013-05-31 (×9): qty 1

## 2013-05-31 MED ORDER — OXYCODONE-ACETAMINOPHEN 5-325 MG PO TABS: 1.0000 | ORAL_TABLET | Freq: Four times a day (QID) | ORAL | Status: DC

## 2013-05-31 MED ORDER — DOXERCALCIFEROL 4 MCG/2ML IV SOLN
INTRAVENOUS | Status: AC
  Administered 2013-05-31: 3.5 ug via INTRAVENOUS
  Filled 2013-05-31: qty 2

## 2013-05-31 NOTE — Progress Notes (Signed)
Patient ID: [name], female   DOB: August 08, 1927, 79 y.o.   MRN: [id number]   [facility] Progress Note   Assessment/ Plan:   1. Postprocedural pseudoaneurysm: Awaiting reversal of anti-coagulation prior to ultrasound compression. 2.ESRD hemodialysis today per usual schedule (M/W/F) 3. Anemia: Low hemoglobin, we'll restart ESA and intravenous iron 4. CKD-MBD: VDRA and phosphorus binders restarted 5. Nutrition: Albumin low and likely indicative of recent inflammation-fracture/pseudoaneurysm. Continue oral nutritional supplements. 6. Hypertension: Blood pressure well controlled-continue to monitor  Subjective:   Reports to be feeling fair-groin pain much better today. Left arm pain tolerable.    Objective:   BP 123/51  Pulse 67  Temp(Src) 97.4 F (36.3 C) (Oral)  Resp 18  Ht 5\' 4"  (1.626 m)  Wt 55.3 kg (121 lb 14.6 oz)  BMI 20.92 kg/m2  SpO2 100%  Physical Exam: Gen: Comfortably resting in bed-family by bedside CVS: Pulse regular in rate and rhythm, heart sounds S1 and S2 normal Resp: Clear to auscultation bilaterally-no rales Abd: Soft, flat, nontender and bowel sounds are normal Ext: No lower extremity edema. Left arm in splint. Left upper arm arteriovenous fistula palpable with good thrill.  Labs: BMET  Recent Labs Lab 05/28/13 1831 05/30/13 0456 05/31/13 0345  NA 136 134* 132*  K 3.9 3.7 4.0  CL 98 96 93*  CO2 28 25 24   GLUCOSE 72 75 93  BUN 12 28* 39*  CREATININE 3.98* 5.90* 7.58*  CALCIUM 8.8 8.9 9.0  PHOS  --   --  3.7   CBC  Recent Labs Lab 05/28/13 1831 05/29/13 0520 05/30/13 0456 05/31/13 0345  WBC 11.0* 10.0 11.4* 10.4  HGB 8.3* 7.6* 7.9* 7.6*  HCT 26.4* 23.7* 24.6* 23.6*  MCV 94.0 94.8 93.9 92.5  PLT 399 374 431* 417*   Medications:    . amiodarone  200 mg Oral Daily  . aspirin  81 mg Oral Daily  . atorvastatin  40 mg Oral Daily  . clopidogrel  75 mg Oral Q breakfast  . [START ON 06/02/2013] darbepoetin  100 mcg  Intravenous Q Wed-HD  . doxercalciferol  3.5 mcg Intravenous Q M,W,F-HD  . folic acid  1 mg Oral Daily  . metoprolol tartrate  25 mg Oral BID  . multivitamin  1 tablet Oral QHS  . sevelamer carbonate  2,400 mg Oral TID WC   Zetta Bills, MD 05/31/2013, 9:03 AM

## 2013-05-31 NOTE — Progress Notes (Signed)
Subjective: LEg feels better.  She is able to move it without pain.  Objective: Vital signs in last 24 hours: Temp:  [97.4 F (36.3 C)-98.1 F (36.7 C)] 97.4 F (36.3 C) (11/24 0505) Pulse Rate:  [63-75] 67 (11/24 0505) Resp:  [16-18] 18 (11/24 0505) BP: (122-125)/(37-51) 123/51 mmHg (11/24 0505) SpO2:  [99 %-100 %] 100 % (11/24 0505) Weight:  [121 lb 14.6 oz (55.3 kg)] 121 lb 14.6 oz (55.3 kg) (11/24 0505) Last BM Date: 05/26/13  Intake/Output from previous day: 11/23 0701 - 11/24 0700 In: 440 [P.O.:440] Out: -  Intake/Output this shift:    Medications Current Facility-Administered Medications  Medication Dose Route Frequency Provider Last Rate Last Dose  . amiodarone (PACERONE) tablet 200 mg  200 mg Oral Daily Wilburt Finlay, PA-C   200 mg at 05/30/13 0947  . aspirin EC tablet 81 mg  81 mg Oral Daily Wilburt Finlay, PA-C   81 mg at 05/30/13 0947  . atorvastatin (LIPITOR) tablet 40 mg  40 mg Oral Daily Wilburt Finlay, PA-C   40 mg at 05/30/13 0946  . clopidogrel (PLAVIX) tablet 75 mg  75 mg Oral Q breakfast Wilburt Finlay, PA-C   75 mg at 05/31/13 8295  . [START ON 06/02/2013] darbepoetin (ARANESP) injection 100 mcg  100 mcg Intravenous Q Wed-HD Wilburt Finlay, PA-C      . doxercalciferol (HECTOROL) injection 3.5 mcg  3.5 mcg Intravenous Q M,W,F-HD Kerin Salen, PA-C      . folic acid (FOLVITE) tablet 1 mg  1 mg Oral Daily Wilburt Finlay, PA-C   1 mg at 05/30/13 0946  . metoprolol tartrate (LOPRESSOR) tablet 25 mg  25 mg Oral BID Wilburt Finlay, PA-C   25 mg at 05/30/13 2131  . morphine 2 MG/ML injection 2 mg  2 mg Intravenous Q4H PRN Wilburt Finlay, PA-C   2 mg at 05/30/13 1455  . multivitamin (RENA-VIT) tablet 1 tablet  1 tablet Oral QHS Wilburt Finlay, PA-C   1 tablet at 05/30/13 2131  . sevelamer carbonate (RENVELA) tablet 2,400 mg  2,400 mg Oral TID WC Wilburt Finlay, PA-C   2,400 mg at 05/31/13 0806  . sorbitol 70 % solution 30 mL  30 mL Oral QHS PRN Wilburt Finlay, PA-C      . traMADol Janean Sark)  tablet 50 mg  50 mg Oral Q6H PRN Wilburt Finlay, PA-C   50 mg at 05/30/13 1807    PE: General appearance: alert, cooperative and no distress Lungs: Mild basilar crackles Heart: regular rate and rhythm and 2/6 sys MM Extremities: No LEE Pulses:2+ radial and 0 dorsalis pedis pulses. 1+ PT. 2+ right femoral pulse. 1+ right popliteal. Skin: Warm and dry,  Moderate hematoma in the right groin with a bruit.   Neurologic: Grossly normal   Lab Results:   Recent Labs  05/29/13 0520 05/30/13 0456 05/31/13 0345  WBC 10.0 11.4* 10.4  HGB 7.6* 7.9* 7.6*  HCT 23.7* 24.6* 23.6*  PLT 374 431* 417*   BMET  Recent Labs  05/28/13 1831 05/30/13 0456 05/31/13 0345  NA 136 134* 132*  K 3.9 3.7 4.0  CL 98 96 93*  CO2 28 25 24   GLUCOSE 72 75 93  BUN 12 28* 39*  CREATININE 3.98* 5.90* 7.58*  CALCIUM 8.8 8.9 9.0   PT/INR  Recent Labs  05/29/13 0520 05/30/13 0456 05/31/13 0345  LABPROT 46.0* 38.0* 36.8*  INR 5.24* 4.08* 3.91*    Assessment/Plan  Principal Problem:   Pseudoaneurysm following  procedure Active Problems:   Anemia   HTN (hypertension)   ESRD (end stage renal disease) on dialysis   Moderate aortic stenosis- EF 60-65% 05/08/13   PAF (paroxysmal atrial fibrillation)- Amiodarone - NSR   CAD- RCA HSRA/DES 05/15/13-  CFX/OM1 DES 05/19/13   Acute pain of right thigh   Supratherapeutic INR  Plan:  HD today.   INR 3.91 today.  Hgb low but stable.  She needs to eat some Vit K today: greens, broccoli, etc.. When INR is therapeutic, we can vascular US try to compress the pseudo.  Will need surgical consult if we can not close it.   LOS: 3 days    HAGER, BRYAN 05/31/2013 8:25 AM   Agree with note written by Jones Skene PAC  Back from HD. INR still supra therapeutic. US shows PA 3.7 cm, Posterior to FA. Neck measures 9X5 mm. The PA is large but the neck is as well. Would let INR drift down and attempt US guided compression first. VSS. Cardiac stable.NSR.  Runell Gess 05/31/2013 4:17 PM

## 2013-05-31 NOTE — Progress Notes (Signed)
Pt received 2 units of PRBCs; denies itching; SOB and pain. Tolerated the transfusion well no s/s of a reaction noted.

## 2013-05-31 NOTE — Progress Notes (Signed)
*  PRELIMINARY RESULTS* Vascular Ultrasound Right Lower Extremity Arterial Duplex has been completed.  There is evidence of a 3.7cm pseudoaneurysm posterior to the common femoral artery and vein, with a neck measuring 8.81mm in length and 5.46mm in width. The neck exhibits to-fro flow, with velocities measuring over 790cm/sec peak systolic and 534cm/sec end diastolic at some points.  There is a heterogenous area adjacent to the pseudoaneurysm measuring 8.94 x 6.98cm with no internal vascularity, suggestive of a possible hematoma.  05/31/2013 10:29 AM Gertie Fey, RVT, RDCS, RDMS

## 2013-05-31 NOTE — Care Management Note (Unsigned)
    Page 1 of 1   06/01/2013     3:12:55 PM   CARE MANAGEMENT NOTE 06/01/2013  Patient:  [name], [name]   Account Number:  [id number]  Date Initiated:  05/31/2013  Documentation initiated by:  Akshith Moncus  Subjective/Objective Assessment:   PT ADM ON 05/28/13 WITH RT GROIN PSEUDOANEURYSM FROM RECENT CATH, SUBTHERAPEUTIC INR.  PTA, PT IS ON HD MWF, AND HAS SUPPORTIVE FAMILY.     Action/Plan:   PT ACTIVE WITH [facility] FOR HOME CARE PRIOR TO ADMISSION.  WILL CONT TO FOLLOW FOR ADDITONAL NEEDS.  WILL NEED RESUMPTION ORDERS FOR HOME CARE PRIOR TO DC.   Anticipated DC Date:  06/02/2013   Anticipated DC Plan:  HOME W HOME HEALTH SERVICES      DC Planning Services  CM consult      Choice offered to / List presented to:             Status of service:  In process, will continue to follow Medicare Important Message given?   (If response is "NO", the following Medicare IM given date fields will be blank) Date Medicare IM given:   Date Additional Medicare IM given:    Discharge Disposition:    Per UR Regulation:  Reviewed for med. necessity/level of care/duration of stay  If discussed at Long Length of Stay Meetings, dates discussed:    Comments:  06/01/13 Brittany Kotula,RN,BSN [phone] MET WITH DAUGHTER TO CONFIRM DC PLANS:  DAUGHTER STATES PLAN IS FOR PT TO RETURN HOME AS PRIOR TO ADMISSION, WITH HH [facility]  WILL CONT TO FOLLOW PROGRESS.  PT WILL NEED HH SERVICE RESUMED PRIOR TO DC.  DTR STATES PT HAS ALL NEEDED EQUIPMENT AT HOME.

## 2013-06-01 ENCOUNTER — Inpatient Hospital Stay (HOSPITAL_COMMUNITY): Payer: Medicare Other

## 2013-06-01 DIAGNOSIS — E785 Hyperlipidemia, unspecified: Secondary | ICD-10-CM

## 2013-06-01 DIAGNOSIS — I724 Aneurysm of artery of lower extremity: Secondary | ICD-10-CM

## 2013-06-01 LAB — TYPE AND SCREEN
Antibody Screen: NEGATIVE
Unit division: 0

## 2013-06-01 LAB — CBC
HCT: 32.2 % — ABNORMAL LOW (ref 36.0–46.0)
Hemoglobin: 10.9 g/dL — ABNORMAL LOW (ref 12.0–15.0)
MCH: 29.8 pg (ref 26.0–34.0)
MCHC: 33.9 g/dL (ref 30.0–36.0)
MCV: 88 fL (ref 78.0–100.0)
Platelets: 327 10*3/uL (ref 150–400)
RDW: 17.4 % — ABNORMAL HIGH (ref 11.5–15.5)
WBC: 11.9 10*3/uL — ABNORMAL HIGH (ref 4.0–10.5)

## 2013-06-01 LAB — BASIC METABOLIC PANEL
BUN: 26 mg/dL — ABNORMAL HIGH (ref 6–23)
CO2: 28 mEq/L (ref 19–32)
Calcium: 9 mg/dL (ref 8.4–10.5)
Chloride: 93 mEq/L — ABNORMAL LOW (ref 96–112)
Creatinine, Ser: 5.06 mg/dL — ABNORMAL HIGH (ref 0.50–1.10)
GFR calc Af Amer: 8 mL/min — ABNORMAL LOW (ref >90)
GFR calc non Af Amer: 7 mL/min — ABNORMAL LOW (ref >90)
Glucose, Bld: 130 mg/dL — ABNORMAL HIGH (ref 70–99)

## 2013-06-01 LAB — PROTIME-INR: Prothrombin Time: 28.3 seconds — ABNORMAL HIGH (ref 11.6–15.2)

## 2013-06-01 MED ORDER — POLYETHYLENE GLYCOL 3350 17 G PO PACK
17.0000 g | PACK | Freq: Every day | ORAL | Status: DC
  Administered 2013-06-01 – 2013-06-02 (×2): 17 g via ORAL
  Filled 2013-06-01 (×2): qty 1

## 2013-06-01 MED ORDER — DOCUSATE SODIUM 100 MG PO CAPS
200.0000 mg | ORAL_CAPSULE | Freq: Every day | ORAL | Status: DC
  Administered 2013-06-01 – 2013-06-05 (×3): 200 mg via ORAL
  Filled 2013-06-01 (×4): qty 2

## 2013-06-01 MED ORDER — IOHEXOL 350 MG/ML SOLN
100.0000 mL | Freq: Once | INTRAVENOUS | Status: AC | PRN
  Administered 2013-06-01: 100 mL via INTRAVENOUS

## 2013-06-01 NOTE — Evaluation (Signed)
Physical Therapy Evaluation Patient Details Name: [name] MRN: [id number] DOB: Apr 06, 1928 Today's Date: 06/01/2013 Time: 4098-1191 PT Time Calculation (min): 24 min  PT Assessment / Plan / Recommendation History of Present Illness  [name] is a 79 y.o. female now admitted on 05/28/13 w/ dx Rt groin pseudoaneurysm following cardiac cath 11/7 with PTCA and stenting. Pt with a history of end-stage renal disease on dialysis, hypertension, hyperlipidemia, paroxysmal atrial fibrillation, and moderate aortic valve stenosis. She is status post recent left arm fracture.    Clinical Impression  Pt admitted with Rt groin pseudoaneurysm. Pt currently with functional limitations due to the deficits listed below (see PT Problem List). Pt has excellent family support. Pt will benefit from skilled PT to increase their independence and safety with mobility to allow discharge to the venue listed below.        PT Assessment  Patient needs continued PT services    Follow Up Recommendations  Home health PT;Supervision for mobility/OOB    Does the patient have the potential to tolerate intense rehabilitation      Barriers to Discharge        Equipment Recommendations  Other (comment) Daughter states OP dialysis center wants pt to get a lift pad to use with their lift device (hoyer-type) to lift pt from her wheelchair into the dialysis recliner   Recommendations for Other Services     Frequency Min 3X/week    Precautions / Restrictions Precautions Precautions: Fall Precaution Comments: LUE in splint & sling Required Braces or Orthoses: Sling Restrictions Weight Bearing Restrictions: Yes LUE Weight Bearing: Non weight bearing   Pertinent Vitals/Pain Pt reported pain in L forearm > Rt groin pain; pt premedicated with pain medicine per her and daughter's request      Mobility  Bed Mobility Bed Mobility: Not assessed Transfers Transfers: Sit to Stand;Stand to Sit Sit to Stand:  3: Mod assist Stand to Sit: 3: Mod assist Details for Transfer Assistance: vc for technique; assist to move out to edge of chair; assist for forward wt-shift over BOS; repeated x3 for practice/training Ambulation/Gait Ambulation/Gait Assistance: 4: Min assist Ambulation Distance (Feet): 10 Feet Assistive device: Straight cane Ambulation/Gait Assistance Details: Pt with very short, shuffling steps and clearly favoring painful RLE Gait Pattern: Step-to pattern;Decreased stride length;Decreased weight shift to right;Right foot flat;Left foot flat;Shuffle;Antalgic    Exercises General Exercises - Lower Extremity Ankle Circles/Pumps: AROM;Right;20 reps;Seated   PT Diagnosis: Difficulty walking  PT Problem List: Decreased strength;Decreased activity tolerance;Decreased balance;Decreased mobility;Decreased knowledge of use of DME;Pain PT Treatment Interventions: DME instruction;Gait training;Functional mobility training;Therapeutic activities;Therapeutic exercise;Balance training;Patient/family education     PT Goals(Current goals can be found in the care plan section) Acute Rehab PT Goals Patient Stated Goal: Go home PT Goal Formulation: With patient/family Time For Goal Achievement: 06/08/13 Potential to Achieve Goals: Good  Visit Information  Last PT Received On: 06/01/13 Assistance Needed: +1 History of Present Illness: [name] is a 79 y.o. female now admitted on 05/28/13 w/ dx Rt groin pseudoaneurysm following cardiac cath 11/7 with PTCA and stenting. Pt with a history of end-stage renal disease on dialysis, hypertension, hyperlipidemia, paroxysmal atrial fibrillation, and moderate aortic valve stenosis. She is status post recent left arm fracture.         Prior Functioning  Home Living Family/patient expects to be discharged to:: Private residence Living Arrangements: Spouse/significant other;Children Available Help at Discharge: Family;Available 24 hours/day Type of Home:  House Home Access: Ramped entrance Home Layout: One  level Home Equipment: Walker - 2 wheels;Cane - single point;Wheelchair - [employer];Shower seat;Hand held shower head;Grab bars - tub/shower;Hospital bed Prior Function Level of Independence: Needs assistance Gait / Transfers Assistance Needed: walks with cane throughout house, always with family member guarding assist ADL's / Homemaking Assistance Needed: Assistance secondary to recent left arm fractue Communication Communication: No difficulties Dominant Hand: Right    Cognition  Cognition Arousal/Alertness: Awake/alert Behavior During Therapy: WFL for tasks assessed/performed Overall Cognitive Status: Within Functional Limits for tasks assessed    Extremity/Trunk Assessment Upper Extremity Assessment Upper Extremity Assessment: Defer to OT evaluation Lower Extremity Assessment Lower Extremity Assessment: Generalized weakness Cervical / Trunk Assessment Cervical / Trunk Assessment: Normal   Balance Balance Balance Assessed: Yes Static Sitting Balance Static Sitting - Balance Support: Right upper extremity supported;Feet supported Static Sitting - Level of Assistance: 6: Modified independent (Device/Increase time) Static Standing Balance Static Standing - Balance Support: Right upper extremity supported Static Standing - Level of Assistance: 4: Min assist  End of Session PT - End of Session Equipment Utilized During Treatment: Gait belt;Other (comment) (LUE splint and sling) Activity Tolerance: Patient limited by pain Patient left: in chair;with call bell/phone within reach;with family/visitor present  GP     Frederick Marro 06/01/2013, 12:48 PM Pager [phone]

## 2013-06-01 NOTE — Progress Notes (Signed)
Patient ID: [name], female   DOB: Nov 22, 1927, 79 y.o.   MRN: [id number]  [facility] Progress Note   Assessment/ Plan:   1. Postprocedural pseudoaneurysm: Results of the Dopplers noted from yesterday-3.7 centimeter pseudoaneurysm noted posterior to the CFA-no internal vascularity for possible intramural thrombus. Further management per cardiology-?ultrasound compression  2.ESRD hemodialysis to be done tomorrow per her usual outpatient schedule  3. Anemia: Low hemoglobin, we'll restart ESA and intravenous iron  4. CKD-MBD: VDRA and phosphorus binders restarted  5. Nutrition: Albumin low and likely indicative of recent inflammation-fracture/pseudoaneurysm. Continue oral nutritional supplements.  6. Hypertension: Blood pressure well controlled-continue to monitor   Subjective:   Reports to be comfortable-minimal right thigh pain, had a comfortable night last night    Objective:   BP 128/56  Pulse 71  Temp(Src) 98.1 F (36.7 C) (Oral)  Resp 18  Ht 5\' 4"  (1.626 m)  Wt 52.2 kg (115 lb 1.3 oz)  BMI 19.74 kg/m2  SpO2 95%  Physical Exam: Gen: Comfortably resting in bed-daughter by bedside CVS: Pulse regular rate and rhythm, S1 and S2 normal Resp: Clear to auscultation bilaterally, no rales or rhonchi Abd: Soft, flat, nontender and bowel sounds are normal Ext: No lower extremity edema. Left upper arm in a splint  Labs: BMET  Recent Labs Lab 05/28/13 1831 05/30/13 0456 05/31/13 0345  NA 136 134* 132*  K 3.9 3.7 4.0  CL 98 96 93*  CO2 28 25 24   GLUCOSE 72 75 93  BUN 12 28* 39*  CREATININE 3.98* 5.90* 7.58*  CALCIUM 8.8 8.9 9.0  PHOS  --   --  3.7   CBC  Recent Labs Lab 05/29/13 0520 05/30/13 0456 05/31/13 0345 05/31/13 1213  WBC 10.0 11.4* 10.4 2.4*  HGB 7.6* 7.9* 7.6* 7.8*  HCT 23.7* 24.6* 23.6* 23.6*  MCV 94.8 93.9 92.5 90.8  PLT 374 431* 417* 322   Medications:    . amiodarone  200 mg Oral Daily  . aspirin  81 mg Oral Daily  .  atorvastatin  40 mg Oral Daily  . clopidogrel  75 mg Oral Q breakfast  . [START ON 06/02/2013] darbepoetin  100 mcg Intravenous Q Wed-HD  . doxercalciferol  3.5 mcg Intravenous Q M,W,F-HD  . folic acid  1 mg Oral Daily  . metoprolol tartrate  25 mg Oral BID  . multivitamin  1 tablet Oral QHS  . sevelamer carbonate  2,400 mg Oral TID WC   Zetta Bills, MD 06/01/2013, 8:54 AM

## 2013-06-01 NOTE — Progress Notes (Signed)
Subjective: Up in chair, pt rec'd 2 units of PRBCs yesterday. More alert today after transfusion.  Objective: Vital signs in last 24 hours: Temp:  [97.5 F (36.4 C)-98.1 F (36.7 C)] 98.1 F (36.7 C) (11/25 0529) Pulse Rate:  [71-84] 71 (11/25 0529) Resp:  [12-20] 18 (11/25 0529) BP: (92-148)/(41-76) 128/56 mmHg (11/25 0529) SpO2:  [95 %-98 %] 95 % (11/25 0529) Weight:  [115 lb 1.3 oz (52.2 kg)] 115 lb 1.3 oz (52.2 kg) (11/25 0529) Weight change: -1 lb 8.7 oz (-0.7 kg) Last BM Date: 05/26/13 Intake/Output from previous day: -790 11/24 0701 - 11/25 0700 In: 913 [P.O.:240; Blood:673] Out: 1703  Intake/Output this shift: Total I/O In: 240 [P.O.:240] Out: -   PE: General:Pleasant affect less flat, NAD Skin:Warm and dry, brisk capillary refill HEENT:normocephalic, sclera clear, mucus membranes moist Heart:S1S2 RRR with 2-3/6 systolic murmur, no gallup, rub or click Lungs:clear without rales, rhonchi, or wheezes NWG:NFAO, non tender, + BS, do not palpate liver spleen or masses Ext:no lower ext edema, rt groin with 2-3 cm hematoma Neuro:alert and oriented, MAE, follows commands, + facial symmetry   Lab Results:  Recent Labs  05/31/13 0345 05/31/13 1213  WBC 10.4 2.4*  HGB 7.6* 7.8*  HCT 23.6* 23.6*  PLT 417* 322   BMET  Recent Labs  05/30/13 0456 05/31/13 0345  NA 134* 132*  K 3.7 4.0  CL 96 93*  CO2 25 24  GLUCOSE 75 93  BUN 28* 39*  CREATININE 5.90* 7.58*  CALCIUM 8.9 9.0   No results found for this basename: TROPONINI, CK, MB,  in the last 72 hours     Lab Results  Component Value Date   TSH 5.516* 05/07/2013    Hepatic Function Panel  Recent Labs  05/31/13 0345  ALBUMIN 2.6*   No results found for this basename: CHOL,  in the last 72 hours No results found for this basename: PROTIME,  in the last 72 hours    EKG: Orders placed in visit on 05/28/13  . EKG 12-LEAD    Studies/Results: No results found.  Medications: I  have reviewed the patient's current medications. Scheduled Meds: . amiodarone  200 mg Oral Daily  . aspirin  81 mg Oral Daily  . atorvastatin  40 mg Oral Daily  . clopidogrel  75 mg Oral Q breakfast  . [START ON 06/02/2013] darbepoetin  100 mcg Intravenous Q Wed-HD  . doxercalciferol  3.5 mcg Intravenous Q M,W,F-HD  . folic acid  1 mg Oral Daily  . metoprolol tartrate  25 mg Oral BID  . multivitamin  1 tablet Oral QHS  . sevelamer carbonate  2,400 mg Oral TID WC   Continuous Infusions:  PRN Meds:.morphine injection, oxyCODONE-acetaminophen, sorbitol, traMADol  Assessment/Plan: Principal Problem:   Pseudoaneurysm following procedure Active Problems:   Anemia   HTN (hypertension)   ESRD (end stage renal disease) on dialysis   Moderate aortic stenosis- EF 60-65% 05/08/13   PAF (paroxysmal atrial fibrillation)- Amiodarone - NSR   CAD- RCA HSRA/DES 05/15/13-  CFX/OM1 DES 05/19/13   Acute pain of right thigh   Supratherapeutic INR  PLAN:Dr. Weingold's office will check on her spint.   INR down to 2.77 , will consider compression of pseudo for tomorrow if MD agrees. Will recheck CBC and BMP today.   Talked with Vascular ultrasound tech.  The vessel is posterior and we would need to compress Vein that could cause a DVT.  Have asked  Vascular service to consult.    LOS: 4 days   Time spent with pt. :15 minutes. [facility]  Nurse Practitioner Certified Pager [phone] 06/01/2013, 11:40 AM  I seen and evaluated the patient along with [name], NP. I agree with her findings, examination, impression and recommendations.  Mrs. Buchta says that her elbow hurts more than her leg now. The daughter also notes that the edema/hematoma swelling has significantly reduced. Her INR is still trending down and is now in the therapeutic window.  Discussion with the vascular ultrasound tech is somewhat concerning for a posterior wall pseudoaneurysm of the right common femoral artery. Concern  with compression of this type of pseudoaneurysm is the potential for venous compression leading to DVT.  With this concern, I think potentially the right course of action would be watchful waiting and allow this pseudoaneurysm this of tamponade. I will consult vascular surgery and asked them to review the ultrasounds and consider the options. The concern is that with a posterior pseudoaneurysm that does not close, she would potentially best served with an open repair.  Reportedly he received 2 units of PRBC in dialysis, but no CBC check today. She does note feeling more alert. Will defer to nephrology  No longer having any anginal pain. Continuing aspirin plus Plavix along with beta blocker and statin. Also continue the amiodarone to avoid complication of atrial fibrillation.  We have asked her orthopedic surgeon be available to consult for assistance with her left elbow pain. Perhaps there is a different splinting mechanism that can be used.  Marykay Lex, [name], M.S. [facility] [address] [place], [place]  [address]  [phone] Pager # [phone] 06/01/2013 1:48 PM

## 2013-06-01 NOTE — Consult Note (Signed)
[facility] NOTE   MRN : [id number]  Reason for Consult: pseudoaneurysm right groin, CV procedures x 3 05/11/2013;05/15/2013 and 05/19/2013 Referring Physician: Dr. Allyson Sabal   History of Present Illness:[name] is a 79 y.o. female  with history of ESRD on hemodialysis, HTN, CAD, hyperlipidemia, paroxysmal atrial fibrillation on coumadin (now on plavix and ASA) and moderate aortic stenosis. She recently developed a non-ST segment elevation myocardial infarction. She also is status post recent left arm fracture. She had undergone cardiac catheterization on 05/11/2013 by Dr. Allyson Sabal and 2 subsequent cath procedures by  Dr. Tresa Endo.  The right femoral artery was punctured anteriorly in the 11/04 and 11/12 procedure. She was admitted on 11/22 with INR of 5.8 and she reported severe right groin pain. The right groin at that time was enlarged and tender. There was evidence for pseudoaneurysm with a poorly visualized neck.  Pt denies previous symptoms of claudication and states her right foot has pain. She has good sensation and motion in BLE. She denies groin pain at rest. We were asked to evaluate for need for repair of pseudoaneurysm  Right Lower Extremity Arterial Duplex on 05/31/13.  There is evidence of a 3.7cm pseudoaneurysm posterior to the common femoral artery and vein, with a neck measuring 8.18mm in length and 5.57mm in width. The neck exhibits to-fro flow, with velocities measuring over 790cm/sec peak systolic and 534cm/sec end diastolic at some points.  There is a heterogenous area adjacent to the pseudoaneurysm measuring 8.94 x 6.98cm with no internal vascularity, suggestive of a possible hematoma.       Current Facility-Administered Medications  Medication Dose Route Frequency Provider Last Rate Last Dose  . amiodarone (PACERONE) tablet 200 mg  200 mg Oral Daily Wilburt Finlay, PA-C   200 mg at 06/01/13 1005  . aspirin EC tablet 81 mg  81 mg Oral  Daily Wilburt Finlay, PA-C   81 mg at 06/01/13 1005  . atorvastatin (LIPITOR) tablet 40 mg  40 mg Oral Daily Wilburt Finlay, PA-C   40 mg at 06/01/13 1006  . clopidogrel (PLAVIX) tablet 75 mg  75 mg Oral Q breakfast Wilburt Finlay, PA-C   75 mg at 06/01/13 0748  . [START ON 06/02/2013] darbepoetin (ARANESP) injection 100 mcg  100 mcg Intravenous Q Wed-HD Wilburt Finlay, PA-C      . docusate sodium (COLACE) capsule 200 mg  200 mg Oral Daily [name], NP   200 mg at 06/01/13 1400  . doxercalciferol (HECTOROL) injection 3.5 mcg  3.5 mcg Intravenous Q M,W,F-HD Kerin Salen, PA-C   3.5 mcg at 05/31/13 1441  . folic acid (FOLVITE) tablet 1 mg  1 mg Oral Daily Wilburt Finlay, PA-C   1 mg at 06/01/13 1005  . metoprolol tartrate (LOPRESSOR) tablet 25 mg  25 mg Oral BID Wilburt Finlay, PA-C   25 mg at 06/01/13 1005  . morphine 2 MG/ML injection 2 mg  2 mg Intravenous Q4H PRN Wilburt Finlay, PA-C   2 mg at 05/30/13 1455  . multivitamin (RENA-VIT) tablet 1 tablet  1 tablet Oral QHS Wilburt Finlay, PA-C   1 tablet at 05/31/13 2244  . oxyCODONE-acetaminophen (PERCOCET/ROXICET) 5-325 MG per tablet 1 tablet  1 tablet Oral Q6H PRN Wilburt Finlay, PA-C   1 tablet at 05/31/13 1639  . polyethylene glycol (MIRALAX / GLYCOLAX) packet 17 g  17 g Oral Daily [name], NP   17 g at 06/01/13 1400  . sevelamer carbonate (RENVELA) tablet 2,400  mg  2,400 mg Oral TID WC Wilburt Finlay, PA-C   2,400 mg at 06/01/13 1130  . sorbitol 70 % solution 30 mL  30 mL Oral QHS PRN Wilburt Finlay, PA-C      . traMADol Janean Sark) tablet 50 mg  50 mg Oral Q6H PRN Wilburt Finlay, PA-C   50 mg at 06/01/13 1004    Pt meds include: Statin :Yes Betablocker: Yes ASA: Yes Other anticoagulants/antiplatelets: plavix  Past Medical History  Diagnosis Date  . Anemia 03/06/2011  . Hypertension   . Coronary artery disease   . Renal disorder   . Aortic stenosis, mild   . A-fib   . Acute CHF   . Myocardial infarction     Past Surgical History  Procedure Laterality Date   . Av fistula placement      L arm  . Other surgical history Bilateral     Surgery on both arms.   . Fracture surgery  Bil. Arms  . Cardiac catheterization      Social History History  Substance Use Topics  . Smoking status: Never Smoker   . Smokeless tobacco: Never Used  . Alcohol Use: No     No Known Allergies   REVIEW OF SYSTEMS  General: [ ]  Weight loss, [ ]  Fever, [ ]  chills Neurologic: [ ]  Dizziness, [ ]  Blackouts, [ ]  Seizure [ ]  Stroke, [ ]  "Mini stroke", [ ]  Slurred speech, [ ]  Temporary blindness; [ ]  weakness in arms or legs, [ ]  Hoarseness [ ]  Dysphagia Cardiac: [ ]  Chest pain/pressure, [ ]  Shortness of breath at rest [ ]  Shortness of breath with exertion, [x ] Atrial fibrillation or irregular heartbeat  Vascular: [ ]  Pain in legs with walking, [ ]  Pain in legs at rest, [ ]  Pain in legs at night,  [ ]  Non-healing ulcer, [ ]  Blood clot in vein/DVT,   Pulmonary: [ ]  Home oxygen, [ ]  Productive cough, [ ]  Coughing up blood, [ ]  Asthma,  [ ]  Wheezing [ ]  COPD Musculoskeletal:  [ ]  Arthritis, [ ]  Low back pain, [ ]  Joint pain Hematologic: [ x] Easy Bruising, [ ]  Anemia; [ ]  Hepatitis Gastrointestinal: [ ]  Blood in stool, [ ]  Gastroesophageal Reflux/heartburn, Urinary: [x ] chronic Kidney disease, [x ] on HD - [ ]  MWF or [ ]  TTHS, [ ]  Burning with urination, [ ]  Difficulty urinating Skin: [ ]  Rashes, [ ]  Wounds Psychological: [ ]  Anxiety, [ ]  Depression  Physical Examination Filed Vitals:   05/31/13 1601 05/31/13 2107 06/01/13 0529 06/01/13 1345  BP: 143/60 138/50 128/56 124/55  Pulse: 75 74 71 72  Temp:  97.9 F (36.6 C) 98.1 F (36.7 C) 97.7 F (36.5 C)  TempSrc:  Oral Oral Oral  Resp:  18 18 18   Height:      Weight:   115 lb 1.3 oz (52.2 kg)   SpO2:  98% 95% 95%   Body mass index is 19.74 kg/(m^2).  General:  WDWN in NAD Gait: bedrest HENT: WNL Eyes: Pupils equal Pulmonary: normal non-labored breathing , without Rales, rhonchi,  wheezing Cardiac:  RRR, with SEM  Murmurs No carotid bruits Abdomen: soft, NT, no masses Skin: no rashes, ulcers noted;  no Gangrene , no cellulitis; no open wounds;  Right groin tender to palpation, no pulsatile mass or ecchymosis noted Vascular Exam/Pulses:monophasic DP/PT right, monophasic DP on left Palp femoral pulses She has good sensation and motion in BLE. Both feet are warm Musculoskeletal: no muscle  wasting or atrophy; no edema  Neurologic: A&O X 3; Appropriate Affect ;  SENSATION: normal; MOTOR FUNCTION: 5/5 Symmetric Speech is fluent/normal  Significant Diagnostic Studies: CBC Lab Results  Component Value Date   WBC 11.9* 06/01/2013   HGB 10.9* 06/01/2013   HCT 32.2* 06/01/2013   MCV 88.0 06/01/2013   PLT 327 06/01/2013    BMET    Component Value Date/Time   NA 133* 06/01/2013 1204   K 3.9 06/01/2013 1204   CL 93* 06/01/2013 1204   CO2 28 06/01/2013 1204   GLUCOSE 130* 06/01/2013 1204   BUN 26* 06/01/2013 1204   CREATININE 5.06* 06/01/2013 1204   CALCIUM 9.0 06/01/2013 1204   CALCIUM 8.3* 02/26/2011 1011   GFRNONAA 7* 06/01/2013 1204   GFRAA 8* 06/01/2013 1204   Estimated Creatinine Clearance: 7.1 ml/min (by C-G formula based on Cr of 5.06).  COAG Lab Results  Component Value Date   INR 2.77* 06/01/2013   INR 3.91* 05/31/2013   INR 4.08* 05/30/2013     ASSESSMENT/PLAN: [name] is a 79 y.o. female with multiple cath procedures following NSTEMI. She has a painful right groin with Right Lower Extremity Arterial Duplex on 05/31/13 showing evidence of a 3.7cm pseudoaneurysm posterior to the common femoral artery and vein, with a neck measuring 8.46mm in length and 5.7mm in width.  Will order CTA ABD/Pelvis to help further define this area INR 2.77 - this will need to be reversed if surgery necessary.  Lendy Dittrich J 06/01/2013 2:29 PM

## 2013-06-01 NOTE — Evaluation (Signed)
Occupational Therapy Evaluation Patient Details Name: [name] MRN: [id number] DOB: [birth year] Today's Date: 06/01/2013 Time: 4098-1191 OT Time Calculation (min): 21 min  OT Assessment / Plan / Recommendation History of present illness PAHOUA [name] is a 79 y.o. female with a history of end-stage renal disease on dialysis, hypertension, hyperlipidemia, paroxysmal atrial fibrillation, and moderate aortic valve stenosis. She presented with a non-ST segment elevation myocardial infarction. She is status post recent left arm fracture & is NWB LUE. On November 7 she underwent difficult but successful complex intervention to a severely calcified subtotal proximal and mid right coronary artery. She also had severe concomitant CAD of her circumflex vessel with total occlusion of the mid AV groove circumflex, severely calcified 90% ostial OM1 stenosis and 90% stenosis of the inferior branch of this marginal vessel. She underwent HSRA, PTCA and stentingCirc/OMand marginal vessel on 05/19/13. At discharge she was maintaining NSR. Pt now admitted on 05/28/13 w/ dx pseudoaneurysm following procedure.     Clinical Impression   Pt presents w/ dx as above w/ generalized weakness, impacting her ability to perform ADL's and functional transfers independently. Discussed SNF rehab w/ pt/family, daughter states "She's going home" Will follow acutely for OT followed by [facility] & 24/7 PRN assist.    OT Assessment  Patient needs continued OT Services    Follow Up Recommendations  Home health OT;Supervision/Assistance - 24 hour    Barriers to Discharge      Equipment Recommendations  None recommended by OT;Other (comment) (Per pt's daughter, pt has DME.)    Recommendations for Other Services    Frequency  Min 2X/week    Precautions / Restrictions Precautions Precautions: Fall Precaution Comments: LUE in splint & sling Required Braces or Orthoses: Sling Restrictions Weight Bearing Restrictions:  No Other Position/Activity Restrictions: LUE functionally NWB with splint/sling   Pertinent Vitals/Pain Denies pain, c/o weakness LE's.    ADL  Eating/Feeding: Performed;Set up;Modified independent Where Assessed - Eating/Feeding: Chair Grooming: Performed;Wash/dry hands;Wash/dry face;Set up Where Assessed - Grooming: Supine, head of bed up Upper Body Bathing: Simulated;Minimal assistance Where Assessed - Upper Body Bathing: Supported sitting Lower Body Bathing: Simulated;Maximal assistance Where Assessed - Lower Body Bathing: Supported sit to stand Upper Body Dressing: Simulated;Minimal assistance Where Assessed - Upper Body Dressing: Supported sitting;Unsupported sitting Lower Body Dressing: Performed;Maximal assistance Where Assessed - Lower Body Dressing: Supported sit to stand Toilet Transfer: Simulated;Minimal assistance (Transfer EOB to chair w/ stand pivot ) Toilet Transfer Method: Sit to stand;Stand pivot Toilet Transfer Equipment: Bedside commode Toileting - Clothing Manipulation and Hygiene: Simulated;Maximal assistance Where Assessed - Engineer, mining and Hygiene: Sit to stand from 3-in-1 or toilet;Standing Tub/Shower Transfer Method: Not assessed Equipment Used: Gait belt;Upper extremity splints;Other (comment) (LUE sling/splint, NWB. ) Transfers/Ambulation Related to ADLs: Pt is Min assist for SPT w/ vc's for positioning, safety and sequencing. Pt w/ generalized weakness throughout and daughter states she/family are able to assist at d/c. ADL Comments: Pt/daughter were educated in role of OT. Discussed possible rehab at SNF, pt's daughter declined stating "She is going home, we/I will take care of her" Discussed ADL's, transfers & safety. Per pt daughter, pt has necessary DME. Recommend HHOT/24/supervision/assist.    OT Diagnosis: Generalized weakness;Acute pain  OT Problem List: Decreased strength;Decreased activity tolerance;Decreased knowledge of  precautions;Decreased knowledge of use of DME or AE;Cardiopulmonary status limiting activity;Impaired UE functional use;Pain OT Treatment Interventions: Self-care/ADL training;DME and/or AE instruction;Patient/family education;Therapeutic activities   OT Goals(Current goals can be found in the care plan section)  Acute Rehab OT Goals Patient Stated Goal: Go home Time For Goal Achievement: 06/15/13 Potential to Achieve Goals: Good  Visit Information  Last OT Received On: 06/01/13 Assistance Needed: +1 History of Present Illness: [name] is a 79 y.o. female with a history of end-stage renal disease on dialysis, hypertension, hyperlipidemia, paroxysmal atrial fibrillation, and moderate aortic valve stenosis. She presented with a non-ST segment elevation myocardial infarction. She is status post recent left arm fracture. On November 7 she underwent difficult but successful complex intervention to a severely calcified subtotal proximal and mid right coronary artery. She also had severe concomitant CAD of her circumflex vessel with total occlusion of the mid AV groove circumflex, severely calcified 90% ostial OM1 stenosis and 90% stenosis of the inferior branch of this marginal vessel. She underwent HSRA, PTCA and stentingCirc/OMand marginal vessel on 05/19/13. At discharge she was maintaining NSR. Pt now admitted on 05/28/13 w/ dx pseudoaneurysm following procedure.         Prior Functioning     Home Living Family/patient expects to be discharged to:: Private residence Living Arrangements: Spouse/significant other;Children Available Help at Discharge: Family;Available 24 hours/day Type of Home: House Home Access: Ramped entrance Home Layout: One level Home Equipment: Walker - 2 wheels;Cane - single point;Wheelchair - [employer];Shower seat;Hand held shower head;Grab bars - tub/shower Prior Function Level of Independence: Needs assistance ADL's / Homemaking Assistance  Needed: Assistance secondary to recent left arm fractue. Family plans for d/c home w/ 24/PRN assist. Communication Communication: No difficulties Dominant Hand: Right    Vision/Perception Vision - History Baseline Vision: Wears glasses all the time Visual History: Cataracts   Cognition  Cognition Arousal/Alertness: Awake/alert Behavior During Therapy: WFL for tasks assessed/performed Overall Cognitive Status: Within Functional Limits for tasks assessed    Extremity/Trunk Assessment Upper Extremity Assessment Upper Extremity Assessment: LUE deficits/detail;Generalized weakness LUE Deficits / Details: LUE in splint & sling, NWB secondary to recent humerus fracture. LUE: Unable to fully assess due to immobilization LUE Coordination: decreased gross motor Lower Extremity Assessment Lower Extremity Assessment: Defer to PT evaluation Cervical / Trunk Assessment Cervical / Trunk Assessment: Normal    Mobility Bed Mobility Bed Mobility: Sit to Supine;Scooting to HOB Supine to Sit: 4: Min assist;HOB elevated Sitting - Scoot to Edge of Bed: 3: Mod assist Details for Bed Mobility Assistance: VC's for safety, sequencing and hand placement. Assist w/ pad and at trunk secondary to NWB LUE. Transfers Transfers: Sit to Stand;Stand to Sit Sit to Stand: 4: Min assist;From bed;From chair/3-in-1 Stand to Sit: 3: Mod assist;To chair/3-in-1 Details for Transfer Assistance: Cues for hand placement, safety and sequence to push with RUE and reach for armrest, assist to control descent to chair.           End of Session OT - End of Session Equipment Utilized During Treatment: Gait belt;Other (comment) (LUE splint/sling NWB) Activity Tolerance: Patient tolerated treatment well Patient left: in chair;with call bell/phone within reach;with family/visitor present Nurse Communication: Mobility status;Weight bearing status  GO     Roselie Awkward Dixon 06/01/2013, 8:28 AM

## 2013-06-02 DIAGNOSIS — K5900 Constipation, unspecified: Secondary | ICD-10-CM | POA: Diagnosis not present

## 2013-06-02 DIAGNOSIS — I729 Aneurysm of unspecified site: Secondary | ICD-10-CM

## 2013-06-02 LAB — PROTIME-INR: Prothrombin Time: 35.6 seconds — ABNORMAL HIGH (ref 11.6–15.2)

## 2013-06-02 MED ORDER — BISACODYL 10 MG RE SUPP
10.0000 mg | Freq: Once | RECTAL | Status: AC
  Administered 2013-06-02: 10 mg via RECTAL
  Filled 2013-06-02: qty 1

## 2013-06-02 MED ORDER — POLYETHYLENE GLYCOL 3350 17 G PO PACK
17.0000 g | PACK | Freq: Three times a day (TID) | ORAL | Status: AC
  Administered 2013-06-02 – 2013-06-03 (×2): 17 g via ORAL
  Filled 2013-06-02 (×3): qty 1

## 2013-06-02 NOTE — Progress Notes (Signed)
Physical Therapy Treatment Patient Details Name: [name] MRN: [id number] DOB: [birth year] Today's Date: 06/02/2013 Time: 6578-4696 PT Time Calculation (min): 23 min  PT Assessment / Plan / Recommendation  History of Present Illness [name] is a 79 y.o. female now admitted on 05/28/13 w/ dx Rt groin pseudoaneurysm following cardiac cath 11/7 with PTCA and stenting. Pt with a history of end-stage renal disease on dialysis, hypertension, hyperlipidemia, paroxysmal atrial fibrillation, and moderate aortic valve stenosis. She is status post recent left arm fracture.     PT Comments   Patient able to participate in mobility, but did not tolerate ambulation in the room today.  Feel she can progress back to prior level of function with HHPT and family assist.  Will continue PT in acute setting.  Follow Up Recommendations  Home health PT;Supervision for mobility/OOB     Does the patient have the potential to tolerate intense rehabilitation   N/A  Barriers to Discharge  None      Equipment Recommendations  Other (comment) (lift pad for hoyer lift for dialysis)    Recommendations for Other Services  None  Frequency Min 3X/week   Progress towards PT Goals Progress towards PT goals: Progressing toward goals  Plan Current plan remains appropriate    Precautions / Restrictions Precautions Precautions: Fall Precaution Comments: LUE in splint & sling Required Braces or Orthoses: Sling Restrictions Weight Bearing Restrictions: Yes LUE Weight Bearing: Non weight bearing   Pertinent Vitals/Pain 8/10 right groin with ambulation; better with rest    Mobility  Bed Mobility Supine to Sit: 4: Min assist;HOB elevated;With rails Sitting - Scoot to Edge of Bed: 3: Mod assist Sit to Supine: 3: Mod assist;HOB flat Scooting to HOB: 3: Mod assist;2: Max assist;With rail Details for Bed Mobility Assistance: assisted with both legs to supine and cues and assist for positioning in bed;  needed increased help to scoot to head of bed due to fatigue. Transfers Sit to Stand: From bed;4: Min assist Stand to Sit: To chair/3-in-1;To bed;4: Min assist Details for Transfer Assistance: cues for technique with increased time to complete tasks Ambulation/Gait Ambulation/Gait Assistance: 3: Mod assist Ambulation Distance (Feet): 3 Feet Assistive device: Straight cane Ambulation/Gait Assistance Details: difficulty with tolerating weight on right LE today and unable to walk in room  Gait Pattern: Shuffle;Antalgic;Trunk flexed    Exercises General Exercises - Lower Extremity Ankle Circles/Pumps: AROM;10 reps;Supine Heel Slides: AAROM;5 reps;Right;Supine Hip ABduction/ADduction: AAROM;Right;5 reps;Supine     PT Goals (current goals can now be found in the care plan section) Acute Rehab PT Goals Patient Stated Goal: Go home  Visit Information  Last PT Received On: 06/02/13 Assistance Needed: +1 History of Present Illness: [name] is a 78 y.o. female now admitted on 05/28/13 w/ dx Rt groin pseudoaneurysm following cardiac cath 11/7 with PTCA and stenting. Pt with a history of end-stage renal disease on dialysis, hypertension, hyperlipidemia, paroxysmal atrial fibrillation, and moderate aortic valve stenosis. She is status post recent left arm fracture.      Subjective Data  Patient Stated Goal: Go home   Cognition  Cognition Arousal/Alertness: Awake/alert Behavior During Therapy: WFL for tasks assessed/performed Overall Cognitive Status: Within Functional Limits for tasks assessed    Balance  Balance Balance Assessed: Yes Dynamic Sitting Balance Dynamic Sitting - Balance Support: Left upper extremity supported;Feet supported;During functional activity Dynamic Sitting - Level of Assistance: 5: Stand by assistance;Other (comment) (min guard A) Static Standing Balance Static Standing - Balance Support: Right upper  extremity supported;During functional activity Static  Standing - Level of Assistance: 4: Min assist  End of Session PT - End of Session Equipment Utilized During Treatment: Gait belt Activity Tolerance: Patient limited by pain Patient left: in bed;with call bell/phone within reach;with family/visitor present   GP     [facility] 06/02/2013, 2:50 PM [place], PT [phone] 06/02/2013

## 2013-06-02 NOTE — Progress Notes (Signed)
Subjective: Constipated.   Objective: Vital signs in last 24 hours: Temp:  [97.5 F (36.4 C)-98.2 F (36.8 C)] 98.2 F (36.8 C) (11/26 0439) Pulse Rate:  [67-72] 68 (11/26 1023) Resp:  [18-20] 20 (11/26 0439) BP: (121-129)/(42-55) 121/51 mmHg (11/26 1023) SpO2:  [95 %-100 %] 98 % (11/26 0439) Weight:  [126 lb (57.153 kg)] 126 lb (57.153 kg) (11/26 0439) Last BM Date: 05/28/13  Intake/Output from previous day: 11/25 0701 - 11/26 0700 In: 720 [P.O.:720] Out: 0  Intake/Output this shift: Total I/O In: 120 [P.O.:120] Out: -   Medications Current Facility-Administered Medications  Medication Dose Route Frequency Provider Last Rate Last Dose  . amiodarone (PACERONE) tablet 200 mg  200 mg Oral Daily Wilburt Finlay, PA-C   200 mg at 06/02/13 1026  . aspirin EC tablet 81 mg  81 mg Oral Daily Wilburt Finlay, PA-C   81 mg at 06/02/13 1025  . atorvastatin (LIPITOR) tablet 40 mg  40 mg Oral Daily Wilburt Finlay, PA-C   40 mg at 06/02/13 1026  . clopidogrel (PLAVIX) tablet 75 mg  75 mg Oral Q breakfast Wilburt Finlay, PA-C   75 mg at 06/02/13 0555  . darbepoetin (ARANESP) injection 100 mcg  100 mcg Intravenous Q Wed-HD Wilburt Finlay, PA-C      . docusate sodium (COLACE) capsule 200 mg  200 mg Oral Daily [name], NP   200 mg at 06/02/13 1025  . doxercalciferol (HECTOROL) injection 3.5 mcg  3.5 mcg Intravenous Q M,W,F-HD Kerin Salen, PA-C   3.5 mcg at 05/31/13 1441  . folic acid (FOLVITE) tablet 1 mg  1 mg Oral Daily Wilburt Finlay, PA-C   1 mg at 06/02/13 1025  . metoprolol tartrate (LOPRESSOR) tablet 25 mg  25 mg Oral BID Wilburt Finlay, PA-C   25 mg at 06/01/13 2104  . morphine 2 MG/ML injection 2 mg  2 mg Intravenous Q4H PRN Wilburt Finlay, PA-C   2 mg at 05/30/13 1455  . multivitamin (RENA-VIT) tablet 1 tablet  1 tablet Oral QHS Wilburt Finlay, PA-C   1 tablet at 06/01/13 2105  . oxyCODONE-acetaminophen (PERCOCET/ROXICET) 5-325 MG per tablet 1 tablet  1 tablet Oral Q6H PRN Wilburt Finlay, PA-C   1  tablet at 06/02/13 0828  . polyethylene glycol (MIRALAX / GLYCOLAX) packet 17 g  17 g Oral TID Wilburt Finlay, PA-C      . sevelamer carbonate (RENVELA) tablet 2,400 mg  2,400 mg Oral TID WC Wilburt Finlay, PA-C   2,400 mg at 06/02/13 0555  . sorbitol 70 % solution 30 mL  30 mL Oral QHS PRN Wilburt Finlay, PA-C   30 mL at 06/01/13 2212  . traMADol (ULTRAM) tablet 50 mg  50 mg Oral Q6H PRN Wilburt Finlay, PA-C   50 mg at 06/01/13 1004    PE: General appearance: alert, cooperative and no distress  Lungs: CTA.  No wheeze, rales, rhonchi  Heart: regular rate and rhythm and 2/6 sys MM  Extremities: No LEE  Pulses:2+ radial and 0 dorsalis pedis pulses. 1+ PT. 2+ right femoral pulse. 1+ right popliteal.  Skin: Warm and dry, Moderate hematoma in the right groin with a bruit.  Neurologic: Grossly normal    Lab Results:   Recent Labs  05/31/13 0345 05/31/13 1213 06/01/13 1204  WBC 10.4 2.4* 11.9*  HGB 7.6* 7.8* 10.9*  HCT 23.6* 23.6* 32.2*  PLT 417* 322 327   BMET  Recent Labs  05/31/13 0345 06/01/13 1204  NA 132*  133*  K 4.0 3.9  CL 93* 93*  CO2 24 28  GLUCOSE 93 130*  BUN 39* 26*  CREATININE 7.58* 5.06*  CALCIUM 9.0 9.0   PT/INR  Recent Labs  05/31/13 0345 06/01/13 0530 06/02/13 0432  LABPROT 36.8* 28.3* 35.6*  INR 3.91* 2.77* 3.74*   CT ABD/PELVIS IMPRESSION: Right femoral pseudoaneurysm, greatest dimension is 3.2 cm by CT. Residual patent pseudoaneurysm is posterior and medial to the right common femoral vein and artery. Pseudoaneurysm neck cannot be defined by CTA. Early enhancement of the right femoral vein noted suggest a component of the AV fistula as well.  Right inguinal and proximal thigh intramuscular hematoma noted with a greatest dimension of 6.9 cm  Iliac and femoral atherosclerosis without evidence of occlusive vascular process  Extensive colonic diverticulosis  Assessment/Plan   Principal Problem:   Pseudoaneurysm following procedure Active  Problems:   Constipation   Anemia   HTN (hypertension)   ESRD (end stage renal disease) on dialysis   Moderate aortic stenosis- EF 60-65% 05/08/13   PAF (paroxysmal atrial fibrillation)- Amiodarone - NSR   CAD- RCA HSRA/DES 05/15/13-  CFX/OM1 DES 05/19/13   Acute pain of right thigh   Supratherapeutic INR  Plan:  SP two units PRBCs.  HGB now 10.9.  BP and HR stable.  INR has jumped up to 3.74 from 2.77 yesterday(?error).  Seen by [facility].  Patent pseudo on CT.  Awaiting plan.  HD today.  Will increase Miralax for constipation.   LOS: 5 days    HAGER, BRYAN 06/02/2013 11:08 AM  Hgb seems stable after transfusion - would suggest minimal residual bleeding.  CT Scan performed per Vasc Sgx request - Pseudoaneurysm with possible A-V fistula noted.  Seems concerning, will await Vasc Sgx completed consult for recommendations.  Would prefer not to fully reverse INR, but if necessary for surgery would be acceptable esp due to concomitant antiplatelet regimen.  ? If compression of PseudoA would be sufficient.    Main concern today is constipation - attempted disimpaction, increased Miralax (may need enema). -- this may be why the INR increased.    NO angina or CHF findings.  Due for HD today.  Unfortunately, I have a feeling that open repair will be necessary. Arm is OK as long as not moving it -- still have yet to see Ortho recommendations.  Marykay Lex, MD

## 2013-06-02 NOTE — Procedures (Signed)
I was present at this session.  I have reviewed the session itself and made appropriate changes.  BP 90-120s, access press ok. tol HD  Curry Seefeldt L 11/26/20146:35 PM

## 2013-06-02 NOTE — Progress Notes (Signed)
Occupational Therapy Treatment Patient Details Name: [name] MRN: [id number] DOB: Nov 15, 1927 Today's Date: 06/02/2013 Time: 4098-1191 OT Time Calculation (min): 24 min  OT Assessment / Plan / Recommendation  History of present illness [name] is a 79 y.o. female now admitted on 05/28/13 w/ dx Rt groin pseudoaneurysm following cardiac cath 11/7 with PTCA and stenting. Pt with a history of end-stage renal disease on dialysis, hypertension, hyperlipidemia, paroxysmal atrial fibrillation, and moderate aortic valve stenosis. She is status post recent left arm fracture.     OT comments  Pt making slow progress with functional goals and should continue with acute OT services to increase level of function and safety. Pt and family plan for her to return home and provide 24 hr care/sup with [facility] therapies  Follow Up Recommendations  Home health OT;Supervision/Assistance - 24 hour    Barriers to Discharge   none    Equipment Recommendations  None recommended by OT    Recommendations for Other Services    Frequency Min 2X/week   Progress towards OT Goals Progress towards OT goals: Progressing toward goals  Plan Discharge plan remains appropriate    Precautions / Restrictions Precautions Precautions: Fall Precaution Comments: LUE in splint & sling Required Braces or Orthoses: Sling Restrictions Weight Bearing Restrictions: Yes LUE Weight Bearing: Non weight bearing   Pertinent Vitals/Pain No c/o pain prior to activity, 4/10 pain R LE during activity    ADL  Lower Body Bathing: Simulated;Maximal assistance;Moderate assistance Lower Body Dressing: Performed;Maximal assistance Where Assessed - Lower Body Dressing: Unsupported sitting Toilet Transfer: Minimal assistance;Performed Toilet Transfer Method: Sit to stand;Stand pivot Toilet Transfer Equipment: Bedside commode Toileting - Clothing Manipulation and Hygiene: Performed;Maximal assistance Where Assessed - International aid/development worker and Hygiene: Standing Tub/Shower Transfer Method: Not assessed Equipment Used: Gait belt;Other (comment) (BSC) Transfers/Ambulation Related to ADLs: vc's for safety and correct hand/feet placement, sequencing ADL Comments: also educated pt and her family on UB bathing and dressing techniques due to NWB/immobilization of L UE. Family plans to take her home and provide care    OT Diagnosis:    OT Problem List:   OT Treatment Interventions:     OT Goals(current goals can now be found in the care plan section) Acute Rehab OT Goals Patient Stated Goal: Go home  Visit Information  Last OT Received On: 06/02/13 Assistance Needed: +1 History of Present Illness: [name] is a 79 y.o. female now admitted on 05/28/13 w/ dx Rt groin pseudoaneurysm following cardiac cath 11/7 with PTCA and stenting. Pt with a history of end-stage renal disease on dialysis, hypertension, hyperlipidemia, paroxysmal atrial fibrillation, and moderate aortic valve stenosis. She is status post recent left arm fracture.      Subjective Data      Prior Functioning       Cognition  Cognition Arousal/Alertness: Awake/alert Behavior During Therapy: WFL for tasks assessed/performed Overall Cognitive Status: Within Functional Limits for tasks assessed    Mobility  Bed Mobility Supine to Sit: 4: Min assist;HOB elevated;With rails Sitting - Scoot to Edge of Bed: 3: Mod assist Details for Bed Mobility Assistance: VC's for safety, sequencing and hand placement. Assist w/ pad and at trunk secondary to NWB LUE. Transfers Transfers: Sit to Stand;Stand to Sit Sit to Stand: From bed;4: Min assist Stand to Sit: To chair/3-in-1;4: Min assist Details for Transfer Assistance: vc's for safety and correct hand/feet placement, sequencing    Exercises      Balance Balance Balance Assessed: Yes Dynamic  Sitting Balance Dynamic Sitting - Balance Support: Left upper extremity supported;Feet  supported;During functional activity Dynamic Sitting - Level of Assistance: 5: Stand by assistance;Other (comment) (min guard A) Static Standing Balance Static Standing - Balance Support: Right upper extremity supported;During functional activity Static Standing - Level of Assistance: 4: Min assist   End of Session OT - End of Session Equipment Utilized During Treatment: Gait belt;Other (comment) (BSC) Activity Tolerance: Patient tolerated treatment well Patient left: with family/visitor present;Other (comment) (ambulating with PT)  GO     Galen Manila 06/02/2013, 2:46 PM

## 2013-06-02 NOTE — Progress Notes (Signed)
    Subjective  -  No acute events overnight Still complains of right groin pain   Physical Exam:  Tender right groin Warm right extrmity Non-labored breathing       Assessment/Plan:    Right femoral pseudoaneurysm I have reviewed her CTA.  The neck of the pseudoaneurysm is not well defined.  She has a nematoma and pseudo posterior to the profunda.  The vein opacifies, suggesting an AV fistula, however, the communication is not seen.  I discussed with Dr. Herbie Baltimore, I would do everything to avoid surgical repair in this lady given her comorbidities.  I would attempt ultrasound guided compression once her INR is < 2.0.  I think the risk of DVT is minimal with compression eventhough the vein will likely be compressed as well during the procedure.  Aveah Castell IV, VAnner Crete 06/02/2013 6:28 PM --  Filed Vitals:   06/02/13 1800  BP: 122/48  Pulse: 101  Temp:   Resp: 16    Intake/Output Summary (Last 24 hours) at 06/02/13 1828 Last data filed at 06/02/13 1300  Gross per 24 hour  Intake    360 ml  Output      0 ml  Net    360 ml     Laboratory CBC    Component Value Date/Time   WBC 11.9* 06/01/2013 1204   HGB 10.9* 06/01/2013 1204   HCT 32.2* 06/01/2013 1204   PLT 327 06/01/2013 1204    BMET    Component Value Date/Time   NA 133* 06/01/2013 1204   K 3.9 06/01/2013 1204   CL 93* 06/01/2013 1204   CO2 28 06/01/2013 1204   GLUCOSE 130* 06/01/2013 1204   BUN 26* 06/01/2013 1204   CREATININE 5.06* 06/01/2013 1204   CALCIUM 9.0 06/01/2013 1204   CALCIUM 8.3* 02/26/2011 1011   GFRNONAA 7* 06/01/2013 1204   GFRAA 8* 06/01/2013 1204    COAG Lab Results  Component Value Date   INR 3.74* 06/02/2013   INR 2.77* 06/01/2013   INR 3.91* 05/31/2013   No results found for this basename: PTT    Antibiotics Anti-infectives   None       V. Charlena Cross, [name] [facility] Office: [phone] Pager:  [phone]

## 2013-06-02 NOTE — Progress Notes (Signed)
Pt complained of nose bleed that was not actively bleeding. Gave a gauze and instructed on pinching if there is more incident. Nose not bleeding now. Will continue to monitor.

## 2013-06-02 NOTE — Consult Note (Signed)
Agree with above.  Will check CTA to better define anatomy.  Durene Cal

## 2013-06-03 DIAGNOSIS — M79609 Pain in unspecified limb: Secondary | ICD-10-CM

## 2013-06-03 LAB — CBC
HCT: 30.1 % — ABNORMAL LOW (ref 36.0–46.0)
Hemoglobin: 9.8 g/dL — ABNORMAL LOW (ref 12.0–15.0)
MCH: 29.5 pg (ref 26.0–34.0)
MCHC: 32.6 g/dL (ref 30.0–36.0)
MCV: 90.7 fL (ref 78.0–100.0)
Platelets: 305 10*3/uL (ref 150–400)
RDW: 16.4 % — ABNORMAL HIGH (ref 11.5–15.5)
WBC: 12.9 10*3/uL — ABNORMAL HIGH (ref 4.0–10.5)

## 2013-06-03 LAB — COMPREHENSIVE METABOLIC PANEL
Albumin: 2.4 g/dL — ABNORMAL LOW (ref 3.5–5.2)
Alkaline Phosphatase: 72 U/L (ref 39–117)
BUN: 17 mg/dL (ref 6–23)
Calcium: 9 mg/dL (ref 8.4–10.5)
Chloride: 93 mEq/L — ABNORMAL LOW (ref 96–112)
Creatinine, Ser: 4.18 mg/dL — ABNORMAL HIGH (ref 0.50–1.10)
GFR calc Af Amer: 10 mL/min — ABNORMAL LOW (ref >90)
Glucose, Bld: 136 mg/dL — ABNORMAL HIGH (ref 70–99)
Potassium: 3.2 mEq/L — ABNORMAL LOW (ref 3.5–5.1)
Total Bilirubin: 0.4 mg/dL (ref 0.3–1.2)
Total Protein: 5.9 g/dL — ABNORMAL LOW (ref 6.0–8.3)

## 2013-06-03 MED ORDER — VITAMIN K1 10 MG/ML IJ SOLN
2.5000 mg | Freq: Once | INTRAVENOUS | Status: AC
  Administered 2013-06-03: 2.5 mg via INTRAVENOUS
  Filled 2013-06-03: qty 0.25

## 2013-06-03 NOTE — Progress Notes (Signed)
Subjective: Leg was hurting earlier but feels better today.  Objective: Vital signs in last 24 hours: Temp:  [97.5 F (36.4 C)-98 F (36.7 C)] 98 F (36.7 C) (11/27 0443) Pulse Rate:  [68-117] 74 (11/27 0443) Resp:  [16-18] 18 (11/27 0443) BP: (93-134)/(45-72) 134/45 mmHg (11/27 0443) SpO2:  [96 %-98 %] 96 % (11/27 0443) Weight:  [118 lb 13.3 oz (53.9 kg)-119 lb 0.8 oz (54 kg)] 119 lb 0.8 oz (54 kg) (11/27 0443) Last BM Date: 06/02/13  Intake/Output from previous day: 11/26 0701 - 11/27 0700 In: 240 [P.O.:240] Out: 847  Intake/Output this shift: Total I/O In: 240 [P.O.:240] Out: -   Medications Current Facility-Administered Medications  Medication Dose Route Frequency Provider Last Rate Last Dose  . amiodarone (PACERONE) tablet 200 mg  200 mg Oral Daily Wilburt Finlay, PA-C   200 mg at 06/02/13 1026  . aspirin EC tablet 81 mg  81 mg Oral Daily Wilburt Finlay, PA-C   81 mg at 06/02/13 1025  . atorvastatin (LIPITOR) tablet 40 mg  40 mg Oral Daily Wilburt Finlay, PA-C   40 mg at 06/02/13 1026  . clopidogrel (PLAVIX) tablet 75 mg  75 mg Oral Q breakfast Wilburt Finlay, PA-C   75 mg at 06/03/13 7829  . darbepoetin (ARANESP) injection 100 mcg  100 mcg Intravenous Q Wed-HD Wilburt Finlay, PA-C   100 mcg at 06/02/13 1748  . docusate sodium (COLACE) capsule 200 mg  200 mg Oral Daily [name], NP   200 mg at 06/02/13 1025  . doxercalciferol (HECTOROL) injection 3.5 mcg  3.5 mcg Intravenous Q M,W,F-HD Kerin Salen, PA-C   3.5 mcg at 06/02/13 1748  . metoprolol tartrate (LOPRESSOR) tablet 25 mg  25 mg Oral BID Wilburt Finlay, PA-C   25 mg at 06/02/13 2110  . morphine 2 MG/ML injection 2 mg  2 mg Intravenous Q4H PRN Wilburt Finlay, PA-C   2 mg at 05/30/13 1455  . multivitamin (RENA-VIT) tablet 1 tablet  1 tablet Oral QHS Wilburt Finlay, PA-C   1 tablet at 06/02/13 2207  . oxyCODONE-acetaminophen (PERCOCET/ROXICET) 5-325 MG per tablet 1 tablet  1 tablet Oral Q6H PRN Wilburt Finlay, PA-C   1 tablet at  06/03/13 [id number]  . polyethylene glycol (MIRALAX / GLYCOLAX) packet 17 g  17 g Oral TID Wilburt Finlay, PA-C   17 g at 06/02/13 2110  . sevelamer carbonate (RENVELA) tablet 2,400 mg  2,400 mg Oral TID WC Wilburt Finlay, PA-C   2,400 mg at 06/03/13 0620  . sorbitol 70 % solution 30 mL  30 mL Oral QHS PRN Wilburt Finlay, PA-C   30 mL at 06/01/13 2212  . traMADol (ULTRAM) tablet 50 mg  50 mg Oral Q6H PRN Wilburt Finlay, PA-C   50 mg at 06/02/13 1256    PE: General appearance: alert, cooperative and no distress  Lungs: CTA. No wheeze, rales, rhonchi  Heart: regular rate and rhythm and 2/6 sys MM  Extremities: No LEE  Pulses:2+ radial and 0 dorsalis pedis pulses. 1+ PT. 2+ right femoral pulse. 1+ right popliteal.  Skin: Warm and dry, Moderate hematoma in the right groin with a bruit.  Neurologic: Grossly normal    Lab Results:   Recent Labs  05/31/13 1213 06/01/13 1204  WBC 2.4* 11.9*  HGB 7.8* 10.9*  HCT 23.6* 32.2*  PLT 322 327   BMET  Recent Labs  06/01/13 1204  NA 133*  K 3.9  CL 93*  CO2 28  GLUCOSE  130*  BUN 26*  CREATININE 5.06*  CALCIUM 9.0   PT/INR  Recent Labs  06/01/13 0530 06/02/13 0432 06/03/13 0441  LABPROT 28.3* 35.6* 37.5*  INR 2.77* 3.74* 4.01*    Assessment/Plan  Principal Problem:   Pseudoaneurysm following procedure Active Problems:   Anemia   HTN (hypertension)   ESRD (end stage renal disease) on dialysis   Moderate aortic stenosis- EF 60-65% 05/08/13   PAF (paroxysmal atrial fibrillation)- Amiodarone - NSR   CAD- RCA HSRA/DES 05/15/13-  CFX/OM1 DES 05/19/13   Acute pain of right thigh   Supratherapeutic INR   Constipation  Plan: SP two units PRBCs. HGB now 10.9. BP and HR stable. INR continues to rise.  Now 4.01. Will give Vit K today.  Seen by [facility] and no plans to close surgically.  Patent pseudo on CT.    CBC and CMET pending.    LOS: 6 days    [name] 06/03/2013 9:02 AM

## 2013-06-03 NOTE — Progress Notes (Signed)
Patient ID: [name], female   DOB: 09/12/1927, 79 y.o.   MRN: [id number]   [facility] Progress Note   Assessment/ Plan:   1. Postprocedural pseudoaneurysm: Plans noted for ultrasound compression of pseudoaneurysm in order to try and obliterate it when INR < 2.0. Surgical repair to be avoided given multiple comorbidities based on discussions between vascular surgery/cardiology.  2.ESRD tolerated hemodialysis well yesterday-next treatment to be done tomorrow per her usual outpatient schedule  3. Anemia: Hemoglobin improved status post packed red blood cell transfusion on Monday-continue PSA  4. CKD-MBD: VDRA and phosphorus binders restarted  5. Nutrition: Albumin low and likely indicative of recent inflammation-fracture/pseudoaneurysm. Continue oral nutritional supplements.  6. Hypertension: Blood pressure well controlled-continue to monitor   Subjective:   Reports tolerable pain over right thigh-some discomfort left arm    Objective:   BP 134/45  Pulse 74  Temp(Src) 98 F (36.7 C) (Oral)  Resp 18  Ht 5\' 4"  (1.626 m)  Wt 54 kg (119 lb 0.8 oz)  BMI 20.42 kg/m2  SpO2 96%  Physical Exam: Gen: Comfortably resting in bed CVS: Pulse regular in rate and rhythm-S1 and S2 with an ejection systolic murmur Resp: Clear to auscultation bilaterally-no rales/rhonchi Abd: Soft, flat, nontender and bowel sounds are normal Ext: No lower extremity edema. Right thigh minimally swollen with tenderness. Left upper arm in splint  Labs: BMET  Recent Labs Lab 05/28/13 1831 05/30/13 0456 05/31/13 0345 06/01/13 1204  NA 136 134* 132* 133*  K 3.9 3.7 4.0 3.9  CL 98 96 93* 93*  CO2 28 25 24 28   GLUCOSE 72 75 93 130*  BUN 12 28* 39* 26*  CREATININE 3.98* 5.90* 7.58* 5.06*  CALCIUM 8.8 8.9 9.0 9.0  PHOS  --   --  3.7  --    CBC  Recent Labs Lab 05/30/13 0456 05/31/13 0345 05/31/13 1213 06/01/13 1204  WBC 11.4* 10.4 2.4* 11.9*  HGB 7.9* 7.6* 7.8* 10.9*  HCT 24.6*  23.6* 23.6* 32.2*  MCV 93.9 92.5 90.8 88.0  PLT 431* 417* 322 327   Medications:    . amiodarone  200 mg Oral Daily  . aspirin  81 mg Oral Daily  . atorvastatin  40 mg Oral Daily  . clopidogrel  75 mg Oral Q breakfast  . darbepoetin  100 mcg Intravenous Q Wed-HD  . docusate sodium  200 mg Oral Daily  . doxercalciferol  3.5 mcg Intravenous Q M,W,F-HD  . metoprolol tartrate  25 mg Oral BID  . multivitamin  1 tablet Oral QHS  . polyethylene glycol  17 g Oral TID  . sevelamer carbonate  2,400 mg Oral TID WC    Zetta Bills, MD 06/03/2013, 8:24 AM

## 2013-06-03 NOTE — Progress Notes (Signed)
Right lower extremity arterial duplex completed.  There still appears to be a pseudoaneurysm.

## 2013-06-03 NOTE — Progress Notes (Signed)
Pt. Seen and examined. Agree with the NP/PA-C note as written.  INR continues to rise off of warfarin - suspect liver dysfunction / metabolic reasons. Recommend low dose Vitamin K.  If INR improved tomorrow, would attempt pseudo closure with ultrasound compression. If not successful, would have to re-consider surgery.  Low risk of stent complication with Vitamin K, since in-stent thrombosis is primarily a platelet-driven phenomenon.  Chrystie Nose, MD, [facility] Attending Cardiologist [facility]

## 2013-06-04 LAB — BASIC METABOLIC PANEL
CO2: 25 mEq/L (ref 19–32)
Calcium: 9.1 mg/dL (ref 8.4–10.5)
GFR calc Af Amer: 8 mL/min — ABNORMAL LOW (ref >90)
GFR calc non Af Amer: 7 mL/min — ABNORMAL LOW (ref >90)
Potassium: 3.9 mEq/L (ref 3.5–5.1)
Sodium: 131 mEq/L — ABNORMAL LOW (ref 135–145)

## 2013-06-04 LAB — PROTIME-INR
INR: 1.59 — ABNORMAL HIGH (ref 0.00–1.49)
Prothrombin Time: 18.5 seconds — ABNORMAL HIGH (ref 11.6–15.2)

## 2013-06-04 LAB — CBC
Hemoglobin: 10 g/dL — ABNORMAL LOW (ref 12.0–15.0)
MCH: 30 pg (ref 26.0–34.0)
MCHC: 33.7 g/dL (ref 30.0–36.0)
Platelets: 331 10*3/uL (ref 150–400)
RBC: 3.33 MIL/uL — ABNORMAL LOW (ref 3.87–5.11)
WBC: 14.9 10*3/uL — ABNORMAL HIGH (ref 4.0–10.5)

## 2013-06-04 MED ORDER — MORPHINE SULFATE 4 MG/ML IJ SOLN
6.0000 mg | Freq: Once | INTRAMUSCULAR | Status: AC
  Administered 2013-06-04: 6 mg via INTRAVENOUS

## 2013-06-04 MED ORDER — DOXERCALCIFEROL 4 MCG/2ML IV SOLN
INTRAVENOUS | Status: AC
  Administered 2013-06-04: 3.5 ug via INTRAVENOUS
  Filled 2013-06-04: qty 2

## 2013-06-04 MED ORDER — HYDRALAZINE HCL 20 MG/ML IJ SOLN
INTRAMUSCULAR | Status: AC
  Administered 2013-06-04: 2 mg via INTRAVENOUS
  Filled 2013-06-04: qty 1

## 2013-06-04 MED ORDER — HYDRALAZINE HCL 20 MG/ML IJ SOLN
2.0000 mg | Freq: Once | INTRAMUSCULAR | Status: AC
  Administered 2013-06-04 (×2): 2 mg via INTRAVENOUS

## 2013-06-04 NOTE — Progress Notes (Signed)
Pt. Seen and examined. Agree with the NP/PA-C note as written. H/H stable. Plan for US guided compression of pseudoaneurysm today - INR 1.59 today.   Chrystie Nose, MD, [facility] Attending Cardiologist [facility]

## 2013-06-04 NOTE — Progress Notes (Signed)
    Subjective  -   Still with complaints of right groin pain, unchanged   Physical Exam:  Non-labored breathing, resting comfortably Right groin tender Right leg well perfused       Assessment/Plan:  Right femoral pseudoaneurysm  INR down to 1.59.  For U/S guided compression later today.  Will follow up tomorrow  Evellyn Tuff IV, V. WELLS 06/04/2013 2:44 PM --  Filed Vitals:   06/04/13 1430  BP: 100/36  Pulse:   Temp:   Resp:     Intake/Output Summary (Last 24 hours) at 06/04/13 1444 Last data filed at 06/04/13 1010  Gross per 24 hour  Intake      0 ml  Output   1540 ml  Net  -1540 ml     Laboratory CBC    Component Value Date/Time   WBC 14.9* 06/04/2013 0630   HGB 10.0* 06/04/2013 0630   HCT 29.7* 06/04/2013 0630   PLT 331 06/04/2013 0630    BMET    Component Value Date/Time   NA 131* 06/04/2013 0630   K 3.9 06/04/2013 0630   CL 93* 06/04/2013 0630   CO2 25 06/04/2013 0630   GLUCOSE 95 06/04/2013 0630   BUN 23 06/04/2013 0630   CREATININE 5.18* 06/04/2013 0630   CALCIUM 9.1 06/04/2013 0630   CALCIUM 8.3* 02/26/2011 1011   GFRNONAA 7* 06/04/2013 0630   GFRAA 8* 06/04/2013 0630    COAG Lab Results  Component Value Date   INR 1.59* 06/04/2013   INR 4.01* 06/03/2013   INR 3.74* 06/02/2013   No results found for this basename: PTT    Antibiotics Anti-infectives   None       V. Charlena Cross, [name] [facility] Office: [phone] Pager:  [phone]

## 2013-06-04 NOTE — Progress Notes (Signed)
VASCULAR LAB PRELIMINARY  PRELIMINARY  PRELIMINARY  PRELIMINARY  Right groin pseudoaneurysm compression completed.    Preliminary report:  Pseudoaneurysm compression for 20 minutes completed in 5 minute intervals. Appears to be closed with no pseudoaneurysm Doppler signal noted.  Mayjor Ager, RVS 06/04/2013, 1:55 PM

## 2013-06-04 NOTE — Progress Notes (Signed)
Subjective:  No chest pain, on HD  Objective:  Vital Signs in the last 24 hours: Temp:  [97.7 F (36.5 C)-98.3 F (36.8 C)] 98.3 F (36.8 C) (11/28 0650) Pulse Rate:  [72-75] 75 (11/28 0727) Resp:  [18] 18 (11/28 0458) BP: (128-144)/(43-60) 128/43 mmHg (11/28 0727) SpO2:  [98 %-100 %] 98 % (11/28 0700) Weight:  [119 lb 7.8 oz (54.2 kg)] 119 lb 7.8 oz (54.2 kg) (11/28 0650)  Intake/Output from previous day:  Intake/Output Summary (Last 24 hours) at 06/04/13 0819 Last data filed at 06/04/13 0300  Gross per 24 hour  Intake    240 ml  Output      0 ml  Net    240 ml    Physical Exam: General appearance: alert, cooperative and no distress Lungs: clear to auscultation bilaterally Heart: regular rate and rhythm and 2/6 systolic murmur Extremities: positive RFA pulsation   Rate: 74  Rhythm: normal sinus rhythm  Lab Results:  Recent Labs  06/03/13 0948 06/04/13 0630  WBC 12.9* 14.9*  HGB 9.8* 10.0*  PLT 305 331    Recent Labs  06/03/13 1112 06/04/13 0630  NA 133* 131*  K 3.2* 3.9  CL 93* 93*  CO2 29 25  GLUCOSE 136* 95  BUN 17 23  CREATININE 4.18* 5.18*   No results found for this basename: TROPONINI, CK, MB,  in the last 72 hours  Recent Labs  06/04/13 0630  INR 1.59*    Imaging: Imaging results have been reviewed  Cardiac Studies:  Assessment/Plan:   Principal Problem:   Pseudoaneurysm following procedure Active Problems:   CAD- RCA HSRA/DES 05/15/13-  CFX/OM1 DES 05/19/13   ESRD (end stage renal disease) on dialysis   Moderate aortic stenosis- EF 60-65% 05/08/13   PAF (paroxysmal atrial fibrillation)- Amiodarone - NSR   Anemia   HTN (hypertension)   Acute pain of right thigh   Supratherapeutic INR   Constipation    PLAN: I spoke with vasc US, she is tentatively scheduled for 11:30 this am for compression.   Corine Shelter PA-C [facility] [phone] 06/04/2013, 8:19 AM

## 2013-06-04 NOTE — Procedures (Signed)
Patient seen on Hemodialysis. QB 400, UF goal 1.5L Treatment adjusted as needed.  Zetta Bills MD [facility]. Office # [phone] Pager # [phone] 10:29 AM

## 2013-06-05 LAB — CBC
HCT: 28.6 % — ABNORMAL LOW (ref 36.0–46.0)
Hemoglobin: 9.4 g/dL — ABNORMAL LOW (ref 12.0–15.0)
MCH: 29.8 pg (ref 26.0–34.0)
MCV: 90.8 fL (ref 78.0–100.0)
Platelets: 322 10*3/uL (ref 150–400)
RBC: 3.15 MIL/uL — ABNORMAL LOW (ref 3.87–5.11)

## 2013-06-05 LAB — BASIC METABOLIC PANEL
BUN: 17 mg/dL (ref 6–23)
CO2: 25 mEq/L (ref 19–32)
Calcium: 9.1 mg/dL (ref 8.4–10.5)
Glucose, Bld: 86 mg/dL (ref 70–99)
Potassium: 4.3 mEq/L (ref 3.5–5.1)
Sodium: 134 mEq/L — ABNORMAL LOW (ref 135–145)

## 2013-06-05 LAB — PROTIME-INR: Prothrombin Time: 17.2 seconds — ABNORMAL HIGH (ref 11.6–15.2)

## 2013-06-05 NOTE — Progress Notes (Addendum)
   CARE MANAGEMENT NOTE 06/05/2013  Patient:  [name], [name]   Account Number:  [id number]  Date Initiated:  05/31/2013  Documentation initiated by:  AMERSON,JULIE  Subjective/Objective Assessment:   PT ADM ON 05/28/13 WITH RT GROIN PSEUDOANEURYSM FROM RECENT CATH, SUBTHERAPEUTIC INR.  PTA, PT IS ON HD MWF, AND HAS SUPPORTIVE FAMILY.     Action/Plan:   PT ACTIVE WITH [facility] FOR HOME CARE PRIOR TO ADMISSION.  WILL CONT TO FOLLOW FOR ADDITONAL NEEDS.  WILL NEED RESUMPTION ORDERS FOR HOME CARE PRIOR TO DC.   Anticipated DC Date:  06/02/2013   Anticipated DC Plan:  HOME W HOME HEALTH SERVICES      DC Planning Services  CM consult      [facility] Choice  Resumption Of Svcs/PTA Provider   Choice offered to / List presented to:             Status of service:  Completed, signed off Medicare Important Message given?   (If response is "NO", the following Medicare IM given date fields will be blank) Date Medicare IM given:   Date Additional Medicare IM given:    Discharge Disposition:  HOME W HOME HEALTH SERVICES  Per UR Regulation:  Reviewed for med. necessity/level of care/duration of stay  If discussed at Long Length of Stay Meetings, dates discussed:    Comments:  06/05/13 16:20 CM called daughter Inocencio Homes [phone] concerning pad for [facility] H[facility] Lift for dialysis transfer from wheelchair to dialysis chair in [facility] facility. MD and Daughter has requested I call [facility] to provide the proper size pad for the specific hoyer.  CM called but Delena Serve will not be open until 05:30 06/07/13 when CM will call.  CM notified [facility] of resumption of orders and no further CM needs were communicated.  Freddy Jaksch, BSN, CM [phone].   06/01/13 JULIE AMERSON,RN,BSN [phone] MET WITH DAUGHTER TO CONFIRM DC PLANS:  DAUGHTER STATES PLAN IS FOR PT TO RETURN HOME AS PRIOR TO ADMISSION, WITH HH SERVICES FROM [facility].  WILL CONT TO FOLLOW PROGRESS.  PT WILL NEED HH SERVICE RESUMED PRIOR TO DC.  DTR STATES PT HAS  ALL NEEDED EQUIPMENT AT HOME.

## 2013-06-05 NOTE — Progress Notes (Signed)
Subjective: Leg feels better  Objective: Vital signs in last 24 hours: Temp:  [98.2 F (36.8 C)-98.7 F (37.1 C)] 98.4 F (36.9 C) (11/29 0436) Pulse Rate:  [73-77] 74 (11/29 0436) Resp:  [16-19] 19 (11/29 0436) BP: (91-143)/(35-58) 120/47 mmHg (11/29 0436) SpO2:  [98 %-100 %] 98 % (11/29 0436) Weight:  [117 lb 1 oz (53.1 kg)-117 lb 15.1 oz (53.5 kg)] 117 lb 15.1 oz (53.5 kg) (11/29 0436) Last BM Date: 06/02/13  Intake/Output from previous day: 11/28 0701 - 11/29 0700 In: 120 [P.O.:120] Out: 1540  Intake/Output this shift:    Medications Current Facility-Administered Medications  Medication Dose Route Frequency Provider Last Rate Last Dose  . amiodarone (PACERONE) tablet 200 mg  200 mg Oral Daily Wilburt Finlay, PA-C   200 mg at 06/04/13 1559  . aspirin EC tablet 81 mg  81 mg Oral Daily Wilburt Finlay, PA-C   81 mg at 06/03/13 0954  . atorvastatin (LIPITOR) tablet 40 mg  40 mg Oral Daily Wilburt Finlay, PA-C   40 mg at 06/03/13 0954  . clopidogrel (PLAVIX) tablet 75 mg  75 mg Oral Q breakfast Wilburt Finlay, PA-C   75 mg at 06/05/13 4098  . darbepoetin (ARANESP) injection 100 mcg  100 mcg Intravenous Q Wed-HD Wilburt Finlay, PA-C   100 mcg at 06/02/13 1748  . docusate sodium (COLACE) capsule 200 mg  200 mg Oral Daily [name], NP   200 mg at 06/02/13 1025  . doxercalciferol (HECTOROL) injection 3.5 mcg  3.5 mcg Intravenous Q M,W,F-HD Kerin Salen, PA-C   3.5 mcg at 06/04/13 1022  . metoprolol tartrate (LOPRESSOR) tablet 25 mg  25 mg Oral BID Wilburt Finlay, PA-C   25 mg at 06/04/13 2123  . morphine 2 MG/ML injection 2 mg  2 mg Intravenous Q4H PRN Wilburt Finlay, PA-C   2 mg at 05/30/13 1455  . multivitamin (RENA-VIT) tablet 1 tablet  1 tablet Oral QHS Wilburt Finlay, PA-C   1 tablet at 06/03/13 2153  . oxyCODONE-acetaminophen (PERCOCET/ROXICET) 5-325 MG per tablet 1 tablet  1 tablet Oral Q6H PRN Wilburt Finlay, PA-C   1 tablet at 06/04/13 1101  . sevelamer carbonate (RENVELA) tablet 2,400 mg   2,400 mg Oral TID WC Wilburt Finlay, PA-C   2,400 mg at 06/05/13 0651  . sorbitol 70 % solution 30 mL  30 mL Oral QHS PRN Wilburt Finlay, PA-C   30 mL at 06/01/13 2212  . traMADol (ULTRAM) tablet 50 mg  50 mg Oral Q6H PRN Wilburt Finlay, PA-C   50 mg at 06/04/13 1191    PE: General appearance: alert, cooperative and no distress Lungs: clear to auscultation bilaterally Heart: regular rate and rhythm and 2/6 sys MM Extremities: No LEE Pulses: 2+ and symmetric Skin: Hematoma in the right groin.  No bruit. Neurologic: Grossly normal  Lab Results:   Recent Labs  06/03/13 0948 06/04/13 0630 06/05/13 0530  WBC 12.9* 14.9* 11.5*  HGB 9.8* 10.0* 9.4*  HCT 30.1* 29.7* 28.6*  PLT 305 331 322   BMET  Recent Labs  06/03/13 1112 06/04/13 0630 06/05/13 0530  NA 133* 131* 134*  K 3.2* 3.9 4.3  CL 93* 93* 97  CO2 29 25 25   GLUCOSE 136* 95 86  BUN 17 23 17   CREATININE 4.18* 5.18* 3.71*  CALCIUM 9.0 9.1 9.1   PT/INR  Recent Labs  06/03/13 0441 06/04/13 0630 06/05/13 0530  LABPROT 37.5* 18.5* 17.2*  INR 4.01* 1.59* 1.44  Cholesterol No results found for this basename: CHOL,  in the last 72 hours Cardiac Enzymes No components found with this basename: TROPONIN,  CKMB,     Assessment/Plan   Principal Problem:   Pseudoaneurysm following procedure Active Problems:   Anemia   HTN (hypertension)   ESRD (end stage renal disease) on dialysis   Moderate aortic stenosis- EF 60-65% 05/08/13   PAF (paroxysmal atrial fibrillation)- Amiodarone - NSR   CAD- RCA HSRA/DES 05/15/13-  CFX/OM1 DES 05/19/13   Acute pain of right thigh   Supratherapeutic INR   Constipation  Plan:  The patient underwent US compression for the pseudoaneurysm yesterday.  After 4 attempts it appeared to be closed.  The patient received 2 mg of IV morphine x3 and IV hydralazine 5mg  times one.  Vital signs were stable but she did have considerable pain during the procedure.  The bruit has resolved.  Hgb stable.   Given that her INR tends to be supratherapeutic, even off coumadin, it may be prudent to try an alternative.  DC home today.    LOS: 8 days    HAGER, BRYAN 06/05/2013 8:49 AM  I have seen and examined the patient along with Wilburt Finlay, PA.  I have reviewed the chart, notes and new data.  I agree with PA's note.  Key new complaints: feels well Key examination changes: no bruit/mass over groin area Key new findings / data: Hgb 9.4  PLAN: DC home HD Q MWF Warfarin is unfortunately still the best option due to her ESRD. Coumadin clinic next week.  Thurmon Fair, MD, [facility] [phone] 06/05/2013, 10:11 AM

## 2013-06-05 NOTE — Discharge Summary (Signed)
. Physician Discharge Summary     Patient ID: [name] MRN: [id number] DOB/AGE: [birth year] 79 y.o. PCP:   [name], MD   Admit date: 05/28/2013 Discharge date: 06/05/2013  Admission Diagnoses:  Pseudoaneurysm   Discharge Diagnoses:  Principal Problem:   Pseudoaneurysm following procedure Active Problems:   Anemia   HTN (hypertension)   ESRD (end stage renal disease) on dialysis   Moderate aortic stenosis- EF 60-65% 05/08/13   PAF (paroxysmal atrial fibrillation)- Amiodarone - NSR   CAD- RCA HSRA/DES 05/15/13-  CFX/OM1 DES 05/19/13   Acute pain of right thigh   Supratherapeutic INR   Constipation   Discharged Condition: stable  Hospital Course:  [name] is a 79 y.o. female with a history of end-stage renal disease on dialysis, hypertension, hyperlipidemia, paroxysmal atrial fibrillation, and moderate aortic valve stenosis. She presented with a non-ST segment elevation myocardial infarction. She is status post recent left arm fracture. On November 7 she underwent difficult but successful complex intervention to a severely calcified subtotal proximal and mid right coronary artery. She also had severe concomitant CAD of her circumflex vessel with total occlusion of the mid AV groove circumflex, severely calcified 90% ostial OM1 stenosis and 90% stenosis of the inferior branch of this marginal vessel. She underwent HSRA, PTCA and stentingCirc/OMand marginal vessel on 05/19/13. At discharge she was maintaining NSR at discharge. She was instructed to take Amiodarone, 200mg  PO BID, for 7 days then change to 200mg  daily on 05/28/13.   She presented to for post hospital evaluation. She developed severe, 9/10, pain in the right groin in the setting of a supratherapeutic INR(6.9 on the 19th and 5.8 today). She reports the pain when standing is severe. The right LE is swollen as well and her left elbow where it was fractured is painful as well.   She was admitted to the  telemetry floor after [place] at the office revealed a pseudoaneurysm.  Coumadin was held to allow the INR to normalize.  Unfortunately, after she received two units of PRBCs, her INR started going back up over 4.  She was then given Vit K and the the following day her INR was 1.59.  Vascular US was able to close the pseudo with compression after four attempts.  During her admission, the pt received hemodialysis which was supervised by nephrology.  Coumadin was restarted . The patient was seen by Dr. Royann Shivers who felt she was stable for DC home.    Consults: vascular surgery, Nephrology  Significant Diagnostic Studies:  ------------------------------------------------------------ Noninvasive Vascular Lab  Post Percutaneous Endovascular Procedure Duplex Study  Patient: Brittany, Beard MR #: [id number] Study Date: 06/04/2013 Gender: F Age: 59 Height: Weight: BSA: Pt. Status: Room: 2W08C  SONOGRAPHER Toma Deiters, RVS ADMITTING Zoila Shutter ATTENDING Hilty, Charlotta Newton REFERRING Wilburt Finlay Reports also to:  ------------------------------------------------------------ History and indications:  Indications  729.5 Pain in limb. 442.3 Aneurysm of artery of lower extremity. History  Diagnostic evaluation.  ------------------------------------------------------------ Study information:  Study status: Routine. Procedure: A vascular evaluation was performed. Image quality was adequate. The study was technically limited due to patient anatomy. Post percutaneous endovascular procedure duplex study. Duplex scan. Location: Bedside. Patient status: Inpatient.  ------------------------------------------------------------ Summary: Ultrasound guided compression of a right groin pseudoaneurysm appears closed post 20 minutes of compression. Prepared and Electronically Authenticated   Treatments:See above  Discharge Exam: Blood pressure 120/47, pulse 74,  temperature 98.4 F (36.9 C), temperature source Oral, resp. rate 19, height 5\' 4"  (1.626  m), weight 117 lb 15.1 oz (53.5 kg), SpO2 98.00%.   Disposition: 01-Home or Self Care      Discharge Orders   Future Orders Complete By Expires   Diet - low sodium heart healthy  As directed    Increase activity slowly  As directed        Medication List         amiodarone 200 MG tablet  Commonly known as:  PACERONE  Take 1 tablet (200 mg total) by mouth daily.     aspirin 81 MG EC tablet  Take 1 tablet (81 mg total) by mouth daily. OVER THE COUNTER     atorvastatin 40 MG tablet  Commonly known as:  LIPITOR  Take 40 mg by mouth daily.     clopidogrel 75 MG tablet  Commonly known as:  PLAVIX  Take 1 tablet (75 mg total) by mouth daily with breakfast.     darbepoetin 100 MCG/0.5ML Soln injection  Commonly known as:  ARANESP  Inject 100 mcg into the skin See admin instructions. Takes every Monday, Wednesday and Friday with dialysis     doxercalciferol 4 MCG/2ML injection  Commonly known as:  HECTOROL  Inject 3.5 mcg into the vein every Monday, Wednesday, and Friday with hemodialysis.     folic acid 1 MG tablet  Commonly known as:  FOLVITE  Take 1 tablet (1 mg total) by mouth daily.     metoprolol tartrate 25 MG tablet  Commonly known as:  LOPRESSOR  Take 1 tablet (25 mg total) by mouth 2 (two) times daily.     multivitamin Tabs tablet  Take 1 tablet by mouth at bedtime.     sevelamer carbonate 800 MG tablet  Commonly known as:  RENVELA  Take 2,400 mg by mouth 3 (three) times daily with meals.     sorbitol 70 % solution  Take 30 mLs by mouth at bedtime as needed.     traMADol 50 MG tablet  Commonly known as:  ULTRAM  Take 1 tablet (50 mg total) by mouth every 6 (six) hours as needed for moderate pain.     warfarin 6 MG tablet  Commonly known as:  COUMADIN  Take 1 half tablet (3mg ) daily or as directed by Coumadin Clinic       Follow-up Information   Follow up  with Faithlynn Deeley, PA-C. (Our office will call you with the appt date and time.)    Specialty:  Physician Assistant   Contact information:   [address] [place] [address] [phone]       Signed: Wilburt Finlay 06/05/2013, 11:49 AM

## 2013-06-08 ENCOUNTER — Ambulatory Visit (INDEPENDENT_AMBULATORY_CARE_PROVIDER_SITE_OTHER): Payer: Medicare Other | Admitting: Pharmacist Clinician (PhC)/ Clinical Pharmacy Specialist

## 2013-06-08 VITALS — BP 124/52 | HR 68

## 2013-06-08 DIAGNOSIS — I4891 Unspecified atrial fibrillation: Secondary | ICD-10-CM

## 2013-06-08 DIAGNOSIS — Z7901 Long term (current) use of anticoagulants: Secondary | ICD-10-CM

## 2013-06-08 LAB — POCT INR: INR: 2.1

## 2013-06-08 MED ORDER — WARFARIN SODIUM 3 MG PO TABS: 3.0000 mg | ORAL_TABLET | Freq: Every day | ORAL | Status: DC

## 2013-06-09 ENCOUNTER — Telehealth: Payer: Self-pay | Admitting: *Deleted

## 2013-06-09 NOTE — Telephone Encounter (Signed)
error 

## 2013-06-14 ENCOUNTER — Ambulatory Visit: Payer: Medicare Other | Admitting: Physician Assistant

## 2013-06-14 ENCOUNTER — Ambulatory Visit (INDEPENDENT_AMBULATORY_CARE_PROVIDER_SITE_OTHER): Payer: PRIVATE HEALTH INSURANCE | Admitting: Physician Assistant

## 2013-06-14 ENCOUNTER — Encounter: Payer: Self-pay | Admitting: Physician Assistant

## 2013-06-14 VITALS — BP 120/70 | HR 78 | Ht 64.0 in | Wt 122.0 lb

## 2013-06-14 DIAGNOSIS — I350 Nonrheumatic aortic (valve) stenosis: Secondary | ICD-10-CM

## 2013-06-14 DIAGNOSIS — I10 Essential (primary) hypertension: Secondary | ICD-10-CM

## 2013-06-14 DIAGNOSIS — I359 Nonrheumatic aortic valve disorder, unspecified: Secondary | ICD-10-CM

## 2013-06-14 DIAGNOSIS — I998 Other disorder of circulatory system: Secondary | ICD-10-CM

## 2013-06-14 DIAGNOSIS — I729 Aneurysm of unspecified site: Secondary | ICD-10-CM

## 2013-06-14 DIAGNOSIS — N186 End stage renal disease: Secondary | ICD-10-CM

## 2013-06-14 DIAGNOSIS — Z7901 Long term (current) use of anticoagulants: Secondary | ICD-10-CM

## 2013-06-14 DIAGNOSIS — E785 Hyperlipidemia, unspecified: Secondary | ICD-10-CM

## 2013-06-14 NOTE — Assessment & Plan Note (Addendum)
Her right groin is soft now and she is pain free.  She does have some swelling in her right ankle which is decreasing.  May be residual edema as a result of pressure from the hematoma on the ext iliac vein.  She was subtherapeutic on coumadin for about 4 days so a DVT is possible(Now therapeutic).  Keep leg elevated when seated.  Follow up in 3 months.

## 2013-06-14 NOTE — Patient Instructions (Signed)
1.  Keep leg elevated when seated.  2.  Follow up in 3 months with Dr. Allyson Sabal

## 2013-06-14 NOTE — Assessment & Plan Note (Signed)
INR last week on 12/1 was 2.1

## 2013-06-14 NOTE — Progress Notes (Signed)
Date:  06/14/2013   ID:  [name], DOB 1928/03/03, MRN [id number]  PCP:  [name], MD  Primary Cardiologist:  Allyson Sabal     History of Present Illness: [name] is a 79 y.o. female with a history of end-stage renal disease on dialysis, hypertension, hyperlipidemia, paroxysmal atrial fibrillation, and moderate aortic valve stenosis. She presented with a non-ST segment elevation myocardial infarction. She is status post recent left arm fracture. On November 7 she underwent difficult but successful complex intervention to a severely calcified subtotal proximal and mid right coronary artery. She also had severe concomitant CAD of her circumflex vessel with total occlusion of the mid AV groove circumflex, severely calcified 90% ostial OM1 stenosis and 90% stenosis of the inferior branch of this marginal vessel. She underwent HSRA, PTCA and stentingCirc/OMand marginal vessel on 05/19/13. At discharge she was maintaining NSR at discharge. She was instructed to take Amiodarone, 200mg  PO BID, for 7 days then change to 200mg  daily on 05/28/13.   She presented to the office for post hospital evaluation on 05/28/13 after developing severe, 9/10, pain in the right groin in the setting of a supratherapeutic INR(6.9 on the 19th and 5.8 today).   She was admitted after [place] at the office revealed a pseudoaneurysm. Coumadin was held to allow the INR to normalize. Unfortunately, after she received two units of PRBCs, her INR started going back up over 4. She was then given Vit K and the the following day her INR was 1.59. Vascular US was able to close the pseudo with compression after four attempts.   She presents today for follow-up.  She is doing better but not yet as active as she thinks she should be.   Her appetite is not very good.  Her right leg and grain are pain free.  She does have some swelling in her right ankle which has been decreasing.  She currently denies nausea, vomiting, fever, chest  pain, shortness of breath, orthopnea, dizziness, PND, cough, congestion, abdominal pain, hematochezia, melena, lower extremity edema, claudication.  She is working with PT who thinks she is improving.   Wt Readings from Last 3 Encounters:  06/14/13 122 lb (55.339 kg)  06/05/13 117 lb 15.1 oz (53.5 kg)  05/28/13 117 lb (53.071 kg)     Past Medical History  Diagnosis Date  . Anemia 03/06/2011  . Hypertension   . Coronary artery disease   . Renal disorder   . Aortic stenosis, mild   . A-fib   . Acute CHF   . Myocardial infarction     Current Outpatient Prescriptions  Medication Sig Dispense Refill  . amiodarone (PACERONE) 200 MG tablet Take 1 tablet (200 mg total) by mouth daily.  30 tablet  0  . aspirin 81 MG EC tablet Take 1 tablet (81 mg total) by mouth daily. OVER THE COUNTER  30 tablet    . atorvastatin (LIPITOR) 40 MG tablet Take 40 mg by mouth daily.        . clopidogrel (PLAVIX) 75 MG tablet Take 1 tablet (75 mg total) by mouth daily with breakfast.  30 tablet  0  . darbepoetin (ARANESP) 100 MCG/0.5ML SOLN injection Inject 100 mcg into the skin See admin instructions. Takes every Monday, Wednesday and Friday with dialysis      . doxercalciferol (HECTOROL) 4 MCG/2ML injection Inject 3.5 mcg into the vein every Monday, Wednesday, and Friday with hemodialysis.      . folic acid (FOLVITE) 1 MG  tablet Take 1 tablet (1 mg total) by mouth daily.  30 tablet  0  . metoprolol tartrate (LOPRESSOR) 25 MG tablet Take 1 tablet (25 mg total) by mouth 2 (two) times daily.  60 tablet  0  . multivitamin (RENA-VIT) TABS tablet Take 1 tablet by mouth at bedtime.  30 tablet  0  . sevelamer carbonate (RENVELA) 800 MG tablet Take 2,400 mg by mouth 3 (three) times daily with meals.      . sorbitol 70 % solution Take 30 mLs by mouth at bedtime as needed.      . traMADol (ULTRAM) 50 MG tablet Take 1 tablet (50 mg total) by mouth every 6 (six) hours as needed for moderate pain.  30 tablet  0  . warfarin  (COUMADIN) 3 MG tablet Take 1 tablet (3 mg total) by mouth daily.  30 tablet  3   No current facility-administered medications for this visit.    Allergies:   No Known Allergies  Social History:  The patient  reports that she has never smoked. She has never used smokeless tobacco. She reports that she does not drink alcohol or use illicit drugs.   Family history:  History reviewed. No pertinent family history.  ROS:  Please see the history of present illness.  All other systems reviewed and negative.   PHYSICAL EXAM: VS:  BP 120/70  Pulse 78  Ht 5\' 4"  (1.626 m)  Wt 122 lb (55.339 kg)  BMI 20.93 kg/m2 Thin, well developed, in no acute distress HEENT: Pupils are equal round react to light accommodation extraocular movements are intact.  Neck: no JVDNo cervical lymphadenopathy. Cardiac: Regular rate and rhythm with 2/6 systolic murmur.  No  rubs or gallops. Lungs:  clear to auscultation bilaterally, no wheezing, rhonchi or rales Ext: 1+ right lower extremity edema.  2+ radial and 1+ dorsalis pedis pulses.  No hemaotma in the right groin.  No bruit. Skin: warm and dry Neuro:  Grossly normal     ASSESSMENT AND PLAN:  Problem List Items Addressed This Visit   HTN (hypertension) (Chronic)     BP well controlled today.    ESRD (end stage renal disease) on dialysis (Chronic)   Moderate aortic stenosis- EF 60-65% 05/08/13 (Chronic)   Long term (current) use of anticoagulants (Chronic)     INR last week on 12/1 was 2.1    Dyslipidemia (Chronic)     Treated with lipitor.    Pseudoaneurysm following procedure - Primary     Her right groin is soft now and she is pain free.  She does have some swelling in her right ankle which is decreasing.  May be residual edema as a result of pressure from the hematoma on the ext iliac vein.  She was subtherapeutic on coumadin for about 4 days so a DVT is possible(Now therapeutic).  Keep leg elevated when seated.  Follow up in 3 months.       I  also recommended some chocolate milk(Whole milk) for getting some additional nutrition. Apparently she can do that.  The other supplement drinks she could not take.

## 2013-06-14 NOTE — Assessment & Plan Note (Signed)
Treated with lipitor

## 2013-06-14 NOTE — Assessment & Plan Note (Signed)
BP well controlled today.

## 2013-06-15 ENCOUNTER — Telehealth: Payer: Self-pay | Admitting: Cardiovascular Disease

## 2013-06-15 NOTE — Telephone Encounter (Signed)
Returned call to [facility] [facility] - informed per instructions of last anti-coag visit on 12/2 that patient is to have INR check by [facility] in 1 week (~12/9). [place] [facility] stated patient has 2 appointments today but will try to get INR today, if not, will check tomorrow. Asked [facility] [facility] to send results to our office.

## 2013-06-15 NOTE — Telephone Encounter (Signed)
Needs to know if she needs to check [name] INR today.  If she dont answer please leave message.  Please call

## 2013-06-16 ENCOUNTER — Ambulatory Visit: Payer: Medicare Other | Admitting: Pharmacist Clinician (PhC)/ Clinical Pharmacy Specialist

## 2013-06-16 ENCOUNTER — Telehealth: Payer: Self-pay | Admitting: Cardiovascular Disease

## 2013-06-16 DIAGNOSIS — Z7901 Long term (current) use of anticoagulants: Secondary | ICD-10-CM

## 2013-06-16 LAB — POCT INR: INR: 4.2

## 2013-06-16 NOTE — Telephone Encounter (Signed)
Pt will be taken by triage nurse.

## 2013-06-16 NOTE — Telephone Encounter (Signed)
See anticoag note

## 2013-06-16 NOTE — Telephone Encounter (Signed)
INR 4.2  Informed will be forwarded to [facility], pharmacist, who will contact her w/ plan.  Verbalized understanding.

## 2013-06-23 ENCOUNTER — Other Ambulatory Visit: Payer: Self-pay | Admitting: *Deleted

## 2013-06-23 ENCOUNTER — Telehealth: Payer: Self-pay | Admitting: Cardiovascular Disease

## 2013-06-23 ENCOUNTER — Ambulatory Visit (INDEPENDENT_AMBULATORY_CARE_PROVIDER_SITE_OTHER): Payer: Medicare Other | Admitting: Pharmacist Clinician (PhC)/ Clinical Pharmacy Specialist

## 2013-06-23 DIAGNOSIS — Z7901 Long term (current) use of anticoagulants: Secondary | ICD-10-CM

## 2013-06-23 MED ORDER — CLOPIDOGREL BISULFATE 75 MG PO TABS: 75.0000 mg | ORAL_TABLET | Freq: Every day | ORAL | Status: DC

## 2013-06-23 MED ORDER — AMIODARONE HCL 200 MG PO TABS: 200.0000 mg | ORAL_TABLET | Freq: Every day | ORAL | Status: DC

## 2013-06-23 MED ORDER — METOPROLOL TARTRATE 25 MG PO TABS: 25.0000 mg | ORAL_TABLET | Freq: Two times a day (BID) | ORAL | Status: DC

## 2013-06-23 NOTE — Telephone Encounter (Signed)
See anticoag note

## 2013-06-23 NOTE — Telephone Encounter (Signed)
PT 45.5 INR 3.8  Message forwarded to K. Alvstad, PharmD.

## 2013-06-25 ENCOUNTER — Other Ambulatory Visit: Payer: Self-pay | Admitting: *Deleted

## 2013-06-25 MED ORDER — CLOPIDOGREL BISULFATE 75 MG PO TABS: 75.0000 mg | ORAL_TABLET | Freq: Every day | ORAL | Status: DC

## 2013-06-25 NOTE — Telephone Encounter (Signed)
Clopidogrel refilled. Tramadol and rena-vite refused (refill not appropriate)

## 2013-06-30 ENCOUNTER — Ambulatory Visit (INDEPENDENT_AMBULATORY_CARE_PROVIDER_SITE_OTHER): Payer: Medicare Other | Admitting: Pharmacist Clinician (PhC)/ Clinical Pharmacy Specialist

## 2013-06-30 ENCOUNTER — Telehealth: Payer: Self-pay | Admitting: Cardiovascular Disease

## 2013-06-30 DIAGNOSIS — Z7901 Long term (current) use of anticoagulants: Secondary | ICD-10-CM

## 2013-06-30 LAB — POCT INR: INR: 3.5

## 2013-06-30 NOTE — Telephone Encounter (Signed)
[name], [place] out of office.  Dr. Royann Shivers notified and reviewed chart.  See advice below for warfarin.  Today  Hold Thurs  1.5 mg Fri   1.5 mg Sat 3 mg Sun 1.5 mg Mon 1.5 mg Tues 3 mg Wed Recheck INR  Call to pt and spoke w/ pt's daughter, [name].  Informed of dosing schedule as stated above.  Daughter wrote instructions and repeated them.  Stated pt goes to the office connected to the hospital to get INR checked and she will go there on Wednesday.  Advised they call in lab order needed on Wednesday as pharmacist is out of the office today.  Verbalized understanding and agreed w/ plan.  Message forwarded to K. Alvstad, PharmD.

## 2013-06-30 NOTE — Telephone Encounter (Signed)
INR 3.5 PT 41.7  Discharged pt today and future checks will need to be done in the office.  Belenda Cruise, pharmacist, out of the office today.  Message forwarded to Hinda Glatter, PA-C for further instructions.

## 2013-07-07 ENCOUNTER — Ambulatory Visit (INDEPENDENT_AMBULATORY_CARE_PROVIDER_SITE_OTHER): Payer: Medicare Other | Admitting: *Deleted

## 2013-07-07 DIAGNOSIS — Z7901 Long term (current) use of anticoagulants: Secondary | ICD-10-CM

## 2013-07-07 DIAGNOSIS — I4891 Unspecified atrial fibrillation: Secondary | ICD-10-CM

## 2013-07-07 LAB — POCT INR: INR: 2.4

## 2013-07-08 HISTORY — PX: CATARACT EXTRACTION W/ INTRAOCULAR LENS  IMPLANT, BILATERAL: SHX1307

## 2013-07-21 ENCOUNTER — Ambulatory Visit (INDEPENDENT_AMBULATORY_CARE_PROVIDER_SITE_OTHER): Payer: Medicare Other | Admitting: *Deleted

## 2013-07-21 DIAGNOSIS — Z7901 Long term (current) use of anticoagulants: Secondary | ICD-10-CM

## 2013-07-21 DIAGNOSIS — I4891 Unspecified atrial fibrillation: Secondary | ICD-10-CM

## 2013-07-21 LAB — POCT INR: INR: 1.6

## 2013-08-04 ENCOUNTER — Ambulatory Visit (INDEPENDENT_AMBULATORY_CARE_PROVIDER_SITE_OTHER): Payer: Medicare Other | Admitting: *Deleted

## 2013-08-04 DIAGNOSIS — I4891 Unspecified atrial fibrillation: Secondary | ICD-10-CM

## 2013-08-04 DIAGNOSIS — Z7901 Long term (current) use of anticoagulants: Secondary | ICD-10-CM

## 2013-08-04 DIAGNOSIS — Z5181 Encounter for therapeutic drug level monitoring: Secondary | ICD-10-CM | POA: Insufficient documentation

## 2013-08-04 DIAGNOSIS — I480 Paroxysmal atrial fibrillation: Secondary | ICD-10-CM

## 2013-08-04 LAB — POCT INR: INR: 2.5

## 2013-08-18 ENCOUNTER — Ambulatory Visit (INDEPENDENT_AMBULATORY_CARE_PROVIDER_SITE_OTHER): Payer: Medicare Other | Admitting: *Deleted

## 2013-08-18 DIAGNOSIS — Z5181 Encounter for therapeutic drug level monitoring: Secondary | ICD-10-CM

## 2013-08-18 DIAGNOSIS — Z7901 Long term (current) use of anticoagulants: Secondary | ICD-10-CM

## 2013-08-18 DIAGNOSIS — I480 Paroxysmal atrial fibrillation: Secondary | ICD-10-CM

## 2013-08-18 DIAGNOSIS — I4891 Unspecified atrial fibrillation: Secondary | ICD-10-CM

## 2013-08-18 LAB — POCT INR
INR: 1.9
INR: 1.9

## 2013-09-08 ENCOUNTER — Ambulatory Visit (INDEPENDENT_AMBULATORY_CARE_PROVIDER_SITE_OTHER): Payer: Medicare Other | Admitting: *Deleted

## 2013-09-08 DIAGNOSIS — I4891 Unspecified atrial fibrillation: Secondary | ICD-10-CM

## 2013-09-08 DIAGNOSIS — Z5181 Encounter for therapeutic drug level monitoring: Secondary | ICD-10-CM

## 2013-09-08 DIAGNOSIS — I480 Paroxysmal atrial fibrillation: Secondary | ICD-10-CM

## 2013-09-08 DIAGNOSIS — Z7901 Long term (current) use of anticoagulants: Secondary | ICD-10-CM

## 2013-09-08 LAB — POCT INR: INR: 2

## 2013-10-06 ENCOUNTER — Ambulatory Visit (INDEPENDENT_AMBULATORY_CARE_PROVIDER_SITE_OTHER): Payer: Medicare Other | Admitting: *Deleted

## 2013-10-06 DIAGNOSIS — I480 Paroxysmal atrial fibrillation: Secondary | ICD-10-CM

## 2013-10-06 DIAGNOSIS — I4891 Unspecified atrial fibrillation: Secondary | ICD-10-CM

## 2013-10-06 DIAGNOSIS — Z7901 Long term (current) use of anticoagulants: Secondary | ICD-10-CM

## 2013-10-06 DIAGNOSIS — Z5181 Encounter for therapeutic drug level monitoring: Secondary | ICD-10-CM

## 2013-10-06 LAB — POCT INR: INR: 3

## 2013-10-13 ENCOUNTER — Encounter (HOSPITAL_COMMUNITY)
Admission: RE | Admit: 2013-10-13 | Discharge: 2013-10-13 | Disposition: A | Discharge status: Home / Self Care | Payer: Medicare Other | Source: Ambulatory Visit | Attending: Ophthalmology | Admitting: Ophthalmology

## 2013-10-13 ENCOUNTER — Encounter (HOSPITAL_COMMUNITY): Payer: Self-pay

## 2013-10-13 ENCOUNTER — Encounter (HOSPITAL_COMMUNITY): Payer: Self-pay | Admitting: Pharmacy Technician

## 2013-10-13 DIAGNOSIS — Z01810 Encounter for preprocedural cardiovascular examination: Secondary | ICD-10-CM | POA: Insufficient documentation

## 2013-10-13 NOTE — Pre-Procedure Instructions (Signed)
Patient given information to sign up for my chart at home. 

## 2013-10-13 NOTE — Patient Instructions (Signed)
Your procedure is scheduled on: 10/19/2013  Report to [facility] at  930  AM.  Call this number if you have problems the morning of surgery: [phone]   Do not eat food or drink liquids :After Midnight.      Take these medicines the morning of surgery with A SIP OF WATER: metoprolol   Do not wear jewelry, make-up or nail polish.  Do not wear lotions, powders, or perfumes.   Do not shave 48 hours prior to surgery.  Do not bring valuables to the hospital.  Contacts, dentures or bridgework may not be worn into surgery.  Leave suitcase in the car. After surgery it may be brought to your room.  For patients admitted to the hospital, checkout time is 11:00 AM the day of discharge.   Patients discharged the day of surgery will not be allowed to drive home.  :     Please read over the following fact sheets that you were given: Coughing and Deep Breathing, Surgical Site Infection Prevention, Anesthesia Post-op Instructions and Care and Recovery After Surgery    Cataract A cataract is a clouding of the lens of the eye. When a lens becomes cloudy, vision is reduced based on the degree and nature of the clouding. Many cataracts reduce vision to some degree. Some cataracts make people more near-sighted as they develop. Other cataracts increase glare. Cataracts that are ignored and become worse can sometimes look white. The white color can be seen through the pupil. CAUSES   Aging. However, cataracts may occur at any age, even in newborns.   Certain drugs.   Trauma to the eye.   Certain diseases such as diabetes.   Specific eye diseases such as chronic inflammation inside the eye or a sudden attack of a rare form of glaucoma.   Inherited or acquired medical problems.  SYMPTOMS   Gradual, progressive drop in vision in the affected eye.   Severe, rapid visual loss. This most often happens when trauma is the cause.  DIAGNOSIS  To detect a cataract, an eye doctor examines the lens. Cataracts  are best diagnosed with an exam of the eyes with the pupils enlarged (dilated) by drops.  TREATMENT  For an early cataract, vision may improve by using different eyeglasses or stronger lighting. If that does not help your vision, surgery is the only effective treatment. A cataract needs to be surgically removed when vision loss interferes with your everyday activities, such as driving, reading, or watching TV. A cataract may also have to be removed if it prevents examination or treatment of another eye problem. Surgery removes the cloudy lens and usually replaces it with a substitute lens (intraocular lens, IOL).  At a time when both you and your doctor agree, the cataract will be surgically removed. If you have cataracts in both eyes, only one is usually removed at a time. This allows the operated eye to heal and be out of danger from any possible problems after surgery (such as infection or poor wound healing). In rare cases, a cataract may be doing damage to your eye. In these cases, your caregiver may advise surgical removal right away. The vast majority of people who have cataract surgery have better vision afterward. HOME CARE INSTRUCTIONS  If you are not planning surgery, you may be asked to do the following:  Use different eyeglasses.   Use stronger or brighter lighting.   Ask your eye doctor about reducing your medicine dose or changing medicines if  it is thought that a medicine caused your cataract. Changing medicines does not make the cataract go away on its own.   Become familiar with your surroundings. Poor vision can lead to injury. Avoid bumping into things on the affected side. You are at a higher risk for tripping or falling.   Exercise extreme care when driving or operating machinery.   Wear sunglasses if you are sensitive to bright light or experiencing problems with glare.  SEEK IMMEDIATE MEDICAL CARE IF:   You have a worsening or sudden vision loss.   You notice redness,  swelling, or increasing pain in the eye.   You have a fever.  Document Released: 06/24/2005 Document Revised: 06/13/2011 Document Reviewed: 02/15/2011 [facility] Patient Information 2012 [place].PATIENT INSTRUCTIONS POST-ANESTHESIA  IMMEDIATELY FOLLOWING SURGERY:  Do not drive or operate machinery for the first twenty four hours after surgery.  Do not make any important decisions for twenty four hours after surgery or while taking narcotic pain medications or sedatives.  If you develop intractable nausea and vomiting or a severe headache please notify your doctor immediately.  FOLLOW-UP:  Please make an appointment with your surgeon as instructed. You do not need to follow up with anesthesia unless specifically instructed to do so.  WOUND CARE INSTRUCTIONS (if applicable):  Keep a dry clean dressing on the anesthesia/puncture wound site if there is drainage.  Once the wound has quit draining you may leave it open to air.  Generally you should leave the bandage intact for twenty four hours unless there is drainage.  If the epidural site drains for more than 36-48 hours please call the anesthesia department.  QUESTIONS?:  Please feel free to call your physician or the hospital operator if you have any questions, and they will be happy to assist you.

## 2013-10-13 NOTE — Pre-Procedure Instructions (Signed)
Patient has dialysis on Mon[facility] Wed-Fri at [facility] [place]. Will draw labs am of surgery.

## 2013-10-18 MED ORDER — CYCLOPENTOLATE-PHENYLEPHRINE OP SOLN OPTIME - NO CHARGE
OPHTHALMIC | Status: AC
  Filled 2013-10-18: qty 2

## 2013-10-18 MED ORDER — TETRACAINE HCL 0.5 % OP SOLN
OPHTHALMIC | Status: AC
  Filled 2013-10-18: qty 2

## 2013-10-18 MED ORDER — KETOROLAC TROMETHAMINE 0.5 % OP SOLN
OPHTHALMIC | Status: AC
  Filled 2013-10-18: qty 5

## 2013-10-19 ENCOUNTER — Encounter (HOSPITAL_COMMUNITY): Payer: Medicare Other | Admitting: Anesthesiology

## 2013-10-19 ENCOUNTER — Encounter (HOSPITAL_COMMUNITY)
Admission: RE | Disposition: A | Discharge status: Home / Self Care | Payer: Self-pay | Source: Ambulatory Visit | Attending: Ophthalmology

## 2013-10-19 ENCOUNTER — Ambulatory Visit (HOSPITAL_COMMUNITY): Payer: Medicare Other | Admitting: Anesthesiology

## 2013-10-19 ENCOUNTER — Ambulatory Visit (HOSPITAL_COMMUNITY)
Admission: RE | Admit: 2013-10-19 | Discharge: 2013-10-19 | Disposition: A | Discharge status: Home / Self Care | Payer: Medicare Other | Source: Ambulatory Visit | Attending: Ophthalmology | Admitting: Ophthalmology

## 2013-10-19 ENCOUNTER — Encounter (HOSPITAL_COMMUNITY): Payer: Self-pay | Admitting: *Deleted

## 2013-10-19 DIAGNOSIS — Z7901 Long term (current) use of anticoagulants: Secondary | ICD-10-CM | POA: Insufficient documentation

## 2013-10-19 DIAGNOSIS — I120 Hypertensive chronic kidney disease with stage 5 chronic kidney disease or end stage renal disease: Secondary | ICD-10-CM | POA: Insufficient documentation

## 2013-10-19 DIAGNOSIS — I4891 Unspecified atrial fibrillation: Secondary | ICD-10-CM | POA: Insufficient documentation

## 2013-10-19 DIAGNOSIS — Z992 Dependence on renal dialysis: Secondary | ICD-10-CM | POA: Insufficient documentation

## 2013-10-19 DIAGNOSIS — N186 End stage renal disease: Secondary | ICD-10-CM | POA: Insufficient documentation

## 2013-10-19 DIAGNOSIS — H2510 Age-related nuclear cataract, unspecified eye: Secondary | ICD-10-CM | POA: Insufficient documentation

## 2013-10-19 HISTORY — PX: CATARACT EXTRACTION W/PHACO: SHX586

## 2013-10-19 LAB — POCT I-STAT 4, (NA,K, GLUC, HGB,HCT)
Glucose, Bld: 74 mg/dL (ref 70–99)
HCT: 34 % — ABNORMAL LOW (ref 36.0–46.0)
Hemoglobin: 11.6 g/dL — ABNORMAL LOW (ref 12.0–15.0)
Potassium: 4.4 mEq/L (ref 3.7–5.3)
Sodium: 139 mEq/L (ref 137–147)

## 2013-10-19 SURGERY — PHACOEMULSIFICATION, CATARACT, WITH IOL INSERTION
Anesthesia: Monitor Anesthesia Care | Site: Eye | Laterality: Right

## 2013-10-19 MED ORDER — BSS IO SOLN
INTRAOCULAR | Status: DC | PRN
  Administered 2013-10-19: 15 mL via INTRAOCULAR

## 2013-10-19 MED ORDER — KETOROLAC TROMETHAMINE 0.5 % OP SOLN
1.0000 [drp] | OPHTHALMIC | Status: AC
  Administered 2013-10-19 (×3): 1 [drp] via OPHTHALMIC

## 2013-10-19 MED ORDER — FENTANYL CITRATE 0.05 MG/ML IJ SOLN: 25.0000 ug | INTRAMUSCULAR | Status: DC | PRN

## 2013-10-19 MED ORDER — PHENYLEPHRINE HCL 2.5 % OP SOLN
1.0000 [drp] | OPHTHALMIC | Status: AC
  Administered 2013-10-19 (×3): 1 [drp] via OPHTHALMIC

## 2013-10-19 MED ORDER — TETRACAINE HCL 0.5 % OP SOLN
1.0000 [drp] | OPHTHALMIC | Status: AC
  Administered 2013-10-19 (×3): 1 [drp] via OPHTHALMIC

## 2013-10-19 MED ORDER — CYCLOPENTOLATE-PHENYLEPHRINE 0.2-1 % OP SOLN
1.0000 [drp] | OPHTHALMIC | Status: AC
  Administered 2013-10-19 (×3): 1 [drp] via OPHTHALMIC

## 2013-10-19 MED ORDER — SODIUM CHLORIDE 0.9 % IV SOLN
INTRAVENOUS | Status: DC | PRN
  Administered 2013-10-19: 08:00:00 via INTRAVENOUS

## 2013-10-19 MED ORDER — SODIUM CHLORIDE 0.9 % IV SOLN
Freq: Once | INTRAVENOUS | Status: AC
  Administered 2013-10-19: 08:00:00 via INTRAVENOUS

## 2013-10-19 MED ORDER — FENTANYL CITRATE 0.05 MG/ML IJ SOLN
25.0000 ug | Freq: Once | INTRAMUSCULAR | Status: AC
  Administered 2013-10-19: 25 ug via INTRAVENOUS
  Filled 2013-10-19: qty 2

## 2013-10-19 MED ORDER — TETRACAINE 0.5 % OP SOLN OPTIME - NO CHARGE
OPHTHALMIC | Status: DC | PRN
  Administered 2013-10-19: 2 [drp] via OPHTHALMIC

## 2013-10-19 MED ORDER — MIDAZOLAM HCL 2 MG/2ML IJ SOLN
1.0000 mg | INTRAMUSCULAR | Status: DC | PRN
  Administered 2013-10-19: 2 mg via INTRAVENOUS
  Filled 2013-10-19: qty 2

## 2013-10-19 MED ORDER — EPINEPHRINE HCL 1 MG/ML IJ SOLN
INTRAOCULAR | Status: DC | PRN
  Administered 2013-10-19: 09:00:00

## 2013-10-19 MED ORDER — ONDANSETRON HCL 4 MG/2ML IJ SOLN
4.0000 mg | Freq: Once | INTRAMUSCULAR | Status: DC | PRN
Start: 2013-10-19 — End: 2013-10-19

## 2013-10-19 MED ORDER — PROVISC 10 MG/ML IO SOLN
INTRAOCULAR | Status: DC | PRN
  Administered 2013-10-19: 0.85 mL via INTRAOCULAR

## 2013-10-19 SURGICAL SUPPLY — 24 items
CAPSULAR TENSION RING-AMO (OPHTHALMIC RELATED) IMPLANT
CLOTH BEACON ORANGE TIMEOUT ST (SAFETY) ×3 IMPLANT
EYE SHIELD UNIVERSAL CLEAR (GAUZE/BANDAGES/DRESSINGS) ×3 IMPLANT
GLOVE BIO SURGEON STRL SZ 6.5 (GLOVE) ×2 IMPLANT
GLOVE BIO SURGEONS STRL SZ 6.5 (GLOVE) ×1
GLOVE ECLIPSE 6.5 STRL STRAW (GLOVE) IMPLANT
GLOVE ECLIPSE 7.0 STRL STRAW (GLOVE) IMPLANT
GLOVE EXAM NITRILE LRG STRL (GLOVE) IMPLANT
GLOVE EXAM NITRILE MD LF STRL (GLOVE) IMPLANT
GLOVE SKINSENSE NS SZ6.5 (GLOVE) ×2
GLOVE SKINSENSE STRL SZ6.5 (GLOVE) ×1 IMPLANT
HEALON 5 0.6 ML (INTRAOCULAR LENS) IMPLANT
KIT VITRECTOMY (OPHTHALMIC RELATED) IMPLANT
PAD ARMBOARD 7.5X6 YLW CONV (MISCELLANEOUS) ×3 IMPLANT
PROC W NO LENS (INTRAOCULAR LENS)
PROC W SPEC LENS (INTRAOCULAR LENS)
PROCESS W NO LENS (INTRAOCULAR LENS) IMPLANT
PROCESS W SPEC LENS (INTRAOCULAR LENS) IMPLANT
RING MALYGIN (MISCELLANEOUS) IMPLANT
SIGHTPATH CAT PROC W REG LENS (Ophthalmic Related) ×3 IMPLANT
TAPE SURG TRANSPORE 1 IN (GAUZE/BANDAGES/DRESSINGS) ×1 IMPLANT
TAPE SURGICAL TRANSPORE 1 IN (GAUZE/BANDAGES/DRESSINGS) ×2
VISCOELASTIC ADDITIONAL (OPHTHALMIC RELATED) IMPLANT
WATER STERILE IRR 250ML POUR (IV SOLUTION) ×3 IMPLANT

## 2013-10-19 NOTE — Discharge Instructions (Signed)
Towana BadgerLottie V Game  10/19/2013           [facility] Instructions [id number]- [place] [id number] [address]      1. Avoid closing eyes tightly. One often closes the eye tightly when laughing, talking, sneezing, coughing or if they feel irritated. At these times, you should be careful not to close your eyes tightly.  2. Instill eye drops as instructed. To instill drops in your eye, open it, look up and have someone gently pull the lower lid down and instill a couple of drops inside the lower lid.  3. Do not touch upper lid.  4. Take Advil or Tylenol for pain.  5. You may use either eye for near work, such as reading or sewing and you may watch television.  6. You may have your hair done at the beauty parlor at any time.  7. Wear dark glasses with or without your own glasses if you are in bright light.  8. Call our office at [phone] or [phone] if you have sharp pain in your eye or unusual symptoms.  9. Do not be concerned because vision in the operative eye is not good. It will not be good, no matter how successful the operation, until you get a special lens for it. Your old glasses will not be suited to the new eye that was operated on and you will not be ready for a new lens for about a month.  10. [facility].    I have received a copy of the above instructions and will follow them.      Follow up Appointment with Dr. Nile RiggsShapiro today between 2-3 pm

## 2013-10-19 NOTE — Anesthesia Postprocedure Evaluation (Signed)
  Anesthesia Post-op Note  Patient: [name]  Procedure(s) Performed: Procedure(s) with comments: CATARACT EXTRACTION PHACO AND INTRAOCULAR LENS PLACEMENT (IOC) (Right) - CDE:15.66  Patient Location: Short Stay  Anesthesia Type:MAC  Level of Consciousness: awake  Airway and Oxygen Therapy: Patient Spontanous Breathing  Post-op Pain: none  Post-op Assessment: Post-op Vital signs reviewed  Post-op Vital Signs: Reviewed  Last Vitals:  Filed Vitals:   10/19/13 0900  BP: 105/41  Pulse:   Temp:   Resp: 15    Complications: No apparent anesthesia complications

## 2013-10-19 NOTE — Anesthesia Preprocedure Evaluation (Signed)
Anesthesia Evaluation  Patient identified by MRN, date of birth, ID band Patient awake    Reviewed: Allergy & Precautions, H&P , NPO status , Patient's Chart, lab work & pertinent test results  Airway Mallampati: III TM Distance: <3 FB Neck ROM: Full    Dental  (+) Edentulous Upper, Edentulous Lower   Pulmonary neg pulmonary ROS,  breath sounds clear to auscultation        Cardiovascular hypertension, Pt. on medications + CAD, + Past MI and +CHF + dysrhythmias Atrial Fibrillation + Valvular Problems/Murmurs AS Rhythm:Regular Rate:Normal     Neuro/Psych    GI/Hepatic   Endo/Other    Renal/GU ESRF and DialysisRenal disease     Musculoskeletal   Abdominal   Peds  Hematology   Anesthesia Other Findings   Reproductive/Obstetrics                           Anesthesia Physical Anesthesia Plan  ASA: IV  Anesthesia Plan: MAC   Post-op Pain Management:    Induction: Intravenous  Airway Management Planned: Nasal Cannula  Additional Equipment:   Intra-op Plan:   Post-operative Plan:   Informed Consent: I have reviewed the patients History and Physical, chart, labs and discussed the procedure including the risks, benefits and alternatives for the proposed anesthesia with the patient or authorized representative who has indicated his/her understanding and acceptance.     Plan Discussed with:   Anesthesia Plan Comments:         Anesthesia Quick Evaluation  

## 2013-10-19 NOTE — Op Note (Signed)
Patient brought to the operating room and prepped and draped in the usual manner. Lid speculum inserted in right eye. Stab incision made at the twelve o'clock position. Provisc instilled in the anterior chamber. A 2.4 mm. Stab incision was made temporally. An anterior capsulotomy was done with a bent 25 gauge needle. The nucleus was hydrodissected. The Phaco tip was inserted in the anterior chamber and the nucleus was emulsified. CDE was 15.66. The cortical material was then removed with the I and A tip. Posterior capsule was the polished. The anterior chamber was deepened with Provisc. A 25.0 Diopter Rayner 570C IOL was then inserted in the capsular bag. Provisc was then removed with the I and A tip. The wound was then hydrated. Patient sent to the Recovery Room in good condition with follow up in my office.  Preoperative Diagnosis: Nuclear Cataract OD  Postoperative Diagnosis: Same  Procedure name: Kelman Phacoemulsification OD with IOL

## 2013-10-19 NOTE — Transfer of Care (Signed)
Immediate Anesthesia Transfer of Care Note  Patient: [name]  Procedure(s) Performed: Procedure(s) with comments: CATARACT EXTRACTION PHACO AND INTRAOCULAR LENS PLACEMENT (IOC) (Right) - CDE:15.66  Patient Location: Short Stay  Anesthesia Type:MAC  Level of Consciousness: awake  Airway & Oxygen Therapy: Patient Spontanous Breathing  Post-op Assessment: Report given to PACU RN  Post vital signs: Reviewed  Complications: No apparent anesthesia complications

## 2013-10-19 NOTE — H&P (Signed)
The patient was re examined and there is no change in the patients condition since the original H and P. 

## 2013-10-20 ENCOUNTER — Encounter (HOSPITAL_COMMUNITY): Payer: Self-pay | Admitting: Ophthalmology

## 2013-10-27 ENCOUNTER — Encounter (HOSPITAL_COMMUNITY): Payer: Self-pay

## 2013-10-27 ENCOUNTER — Encounter (HOSPITAL_COMMUNITY)
Admission: RE | Admit: 2013-10-27 | Discharge: 2013-10-27 | Disposition: A | Discharge status: Home / Self Care | Payer: Medicare Other | Source: Ambulatory Visit | Attending: Ophthalmology | Admitting: Ophthalmology

## 2013-11-01 MED ORDER — PHENYLEPHRINE HCL 2.5 % OP SOLN
OPHTHALMIC | Status: AC
  Filled 2013-11-01: qty 15

## 2013-11-01 MED ORDER — KETOROLAC TROMETHAMINE 0.5 % OP SOLN
OPHTHALMIC | Status: AC
  Filled 2013-11-01: qty 5

## 2013-11-01 MED ORDER — CYCLOPENTOLATE-PHENYLEPHRINE OP SOLN OPTIME - NO CHARGE
OPHTHALMIC | Status: AC
  Filled 2013-11-01: qty 2

## 2013-11-01 MED ORDER — TETRACAINE HCL 0.5 % OP SOLN
OPHTHALMIC | Status: AC
  Filled 2013-11-01: qty 2

## 2013-11-02 ENCOUNTER — Encounter (HOSPITAL_COMMUNITY)
Admission: RE | Disposition: A | Discharge status: Home / Self Care | Payer: Self-pay | Source: Ambulatory Visit | Attending: Ophthalmology

## 2013-11-02 ENCOUNTER — Ambulatory Visit (HOSPITAL_COMMUNITY): Payer: Medicare Other | Admitting: Anesthesiology

## 2013-11-02 ENCOUNTER — Encounter (HOSPITAL_COMMUNITY): Payer: Self-pay | Admitting: Anesthesiology

## 2013-11-02 ENCOUNTER — Encounter (HOSPITAL_COMMUNITY): Payer: Medicare Other | Admitting: Anesthesiology

## 2013-11-02 ENCOUNTER — Ambulatory Visit (HOSPITAL_COMMUNITY)
Admission: RE | Admit: 2013-11-02 | Discharge: 2013-11-02 | Disposition: A | Discharge status: Home / Self Care | Payer: Medicare Other | Source: Ambulatory Visit | Attending: Ophthalmology | Admitting: Ophthalmology

## 2013-11-02 DIAGNOSIS — I10 Essential (primary) hypertension: Secondary | ICD-10-CM | POA: Insufficient documentation

## 2013-11-02 DIAGNOSIS — I120 Hypertensive chronic kidney disease with stage 5 chronic kidney disease or end stage renal disease: Secondary | ICD-10-CM | POA: Diagnosis not present

## 2013-11-02 DIAGNOSIS — Z992 Dependence on renal dialysis: Secondary | ICD-10-CM | POA: Insufficient documentation

## 2013-11-02 DIAGNOSIS — N186 End stage renal disease: Secondary | ICD-10-CM | POA: Diagnosis not present

## 2013-11-02 DIAGNOSIS — H2510 Age-related nuclear cataract, unspecified eye: Secondary | ICD-10-CM | POA: Diagnosis present

## 2013-11-02 DIAGNOSIS — Z79899 Other long term (current) drug therapy: Secondary | ICD-10-CM | POA: Insufficient documentation

## 2013-11-02 HISTORY — PX: CATARACT EXTRACTION W/PHACO: SHX586

## 2013-11-02 LAB — POCT I-STAT 4, (NA,K, GLUC, HGB,HCT)
Glucose, Bld: 82 mg/dL (ref 70–99)
HEMATOCRIT: 36 % (ref 36.0–46.0)
Hemoglobin: 12.2 g/dL (ref 12.0–15.0)
Potassium: 4 mEq/L (ref 3.7–5.3)
Sodium: 140 mEq/L (ref 137–147)

## 2013-11-02 SURGERY — PHACOEMULSIFICATION, CATARACT, WITH IOL INSERTION
Anesthesia: Monitor Anesthesia Care | Site: Eye | Laterality: Left

## 2013-11-02 MED ORDER — FENTANYL CITRATE 0.05 MG/ML IJ SOLN
25.0000 ug | INTRAMUSCULAR | Status: DC | PRN
Start: 2013-11-02 — End: 2013-11-02

## 2013-11-02 MED ORDER — BSS IO SOLN
INTRAOCULAR | Status: DC | PRN
  Administered 2013-11-02: 15 mL

## 2013-11-02 MED ORDER — FENTANYL CITRATE 0.05 MG/ML IJ SOLN
INTRAMUSCULAR | Status: AC
  Filled 2013-11-02: qty 2

## 2013-11-02 MED ORDER — CYCLOPENTOLATE-PHENYLEPHRINE 0.2-1 % OP SOLN
1.0000 [drp] | OPHTHALMIC | Status: AC
  Administered 2013-11-02 (×3): 1 [drp] via OPHTHALMIC

## 2013-11-02 MED ORDER — TETRACAINE HCL 0.5 % OP SOLN
1.0000 [drp] | OPHTHALMIC | Status: AC
  Administered 2013-11-02 (×3): 1 [drp] via OPHTHALMIC

## 2013-11-02 MED ORDER — EPINEPHRINE HCL 1 MG/ML IJ SOLN
INTRAOCULAR | Status: DC | PRN
  Administered 2013-11-02: 08:00:00

## 2013-11-02 MED ORDER — LACTATED RINGERS IV SOLN
INTRAVENOUS | Status: DC
Start: 2013-11-02 — End: 2013-11-02

## 2013-11-02 MED ORDER — KETOROLAC TROMETHAMINE 0.5 % OP SOLN
1.0000 [drp] | OPHTHALMIC | Status: AC
  Administered 2013-11-02 (×3): 1 [drp] via OPHTHALMIC

## 2013-11-02 MED ORDER — FENTANYL CITRATE 0.05 MG/ML IJ SOLN
25.0000 ug | INTRAMUSCULAR | Status: AC
  Administered 2013-11-02 (×2): 25 ug via INTRAVENOUS

## 2013-11-02 MED ORDER — PROVISC 10 MG/ML IO SOLN
INTRAOCULAR | Status: DC | PRN
  Administered 2013-11-02: 0.85 mL via INTRAOCULAR

## 2013-11-02 MED ORDER — ONDANSETRON HCL 4 MG/2ML IJ SOLN: 4.0000 mg | Freq: Once | INTRAMUSCULAR | Status: DC | PRN

## 2013-11-02 MED ORDER — MIDAZOLAM HCL 2 MG/2ML IJ SOLN
1.0000 mg | INTRAMUSCULAR | Status: DC | PRN
  Administered 2013-11-02: 1 mg via INTRAVENOUS

## 2013-11-02 MED ORDER — PHENYLEPHRINE HCL 2.5 % OP SOLN
1.0000 [drp] | OPHTHALMIC | Status: AC
  Administered 2013-11-02 (×3): 1 [drp] via OPHTHALMIC

## 2013-11-02 MED ORDER — MIDAZOLAM HCL 2 MG/2ML IJ SOLN
INTRAMUSCULAR | Status: AC
  Filled 2013-11-02: qty 2

## 2013-11-02 SURGICAL SUPPLY — 24 items
CAPSULAR TENSION RING-AMO (OPHTHALMIC RELATED) IMPLANT
CLOTH BEACON ORANGE TIMEOUT ST (SAFETY) ×3 IMPLANT
EYE SHIELD UNIVERSAL CLEAR (GAUZE/BANDAGES/DRESSINGS) ×3 IMPLANT
GLOVE BIO SURGEON STRL SZ 6.5 (GLOVE) ×2 IMPLANT
GLOVE BIO SURGEONS STRL SZ 6.5 (GLOVE) ×1
GLOVE ECLIPSE 6.5 STRL STRAW (GLOVE) IMPLANT
GLOVE ECLIPSE 7.0 STRL STRAW (GLOVE) IMPLANT
GLOVE EXAM NITRILE LRG STRL (GLOVE) IMPLANT
GLOVE EXAM NITRILE MD LF STRL (GLOVE) ×3 IMPLANT
GLOVE SKINSENSE NS SZ6.5 (GLOVE)
GLOVE SKINSENSE STRL SZ6.5 (GLOVE) IMPLANT
HEALON 5 0.6 ML (INTRAOCULAR LENS) IMPLANT
KIT VITRECTOMY (OPHTHALMIC RELATED) IMPLANT
PAD ARMBOARD 7.5X6 YLW CONV (MISCELLANEOUS) ×3 IMPLANT
PROC W NO LENS (INTRAOCULAR LENS)
PROC W SPEC LENS (INTRAOCULAR LENS)
PROCESS W NO LENS (INTRAOCULAR LENS) IMPLANT
PROCESS W SPEC LENS (INTRAOCULAR LENS) IMPLANT
RING MALYGIN (MISCELLANEOUS) IMPLANT
SIGHTPATH CAT PROC W REG LENS (Ophthalmic Related) ×3 IMPLANT
TAPE SURG TRANSPORE 1 IN (GAUZE/BANDAGES/DRESSINGS) ×1 IMPLANT
TAPE SURGICAL TRANSPORE 1 IN (GAUZE/BANDAGES/DRESSINGS) ×2
VISCOELASTIC ADDITIONAL (OPHTHALMIC RELATED) IMPLANT
WATER STERILE IRR 250ML POUR (IV SOLUTION) ×3 IMPLANT

## 2013-11-02 NOTE — Anesthesia Postprocedure Evaluation (Signed)
  Anesthesia Post-op Note  Patient: [name]  Procedure(s) Performed: Procedure(s) with comments: CATARACT EXTRACTION PHACO AND INTRAOCULAR LENS PLACEMENT (IOC) (Left) - CDE:8.78  Patient Location: Short Stay  Anesthesia Type:MAC  Level of Consciousness: awake  Airway and Oxygen Therapy: Patient Spontanous Breathing  Post-op Pain: none  Post-op Assessment: Post-op Vital signs reviewed, Patient's Cardiovascular Status Stable, Respiratory Function Stable, Patent Airway and No signs of Nausea or vomiting  Post-op Vital Signs: Reviewed and stable  Last Vitals: There were no vitals filed for this visit.  Complications: No apparent anesthesia complications

## 2013-11-02 NOTE — Transfer of Care (Signed)
Immediate Anesthesia Transfer of Care Note  Patient: [name]  Procedure(s) Performed: Procedure(s) with comments: CATARACT EXTRACTION PHACO AND INTRAOCULAR LENS PLACEMENT (IOC) (Left) - CDE:8.78  Patient Location: Short Stay  Anesthesia Type:MAC  Level of Consciousness: awake  Airway & Oxygen Therapy: Patient Spontanous Breathing  Post-op Assessment: Report given to PACU RN  Post vital signs: Reviewed  Complications: No apparent anesthesia complications

## 2013-11-02 NOTE — H&P (Signed)
The patient was re examined and there is no change in the patients condition since the original H and P. 

## 2013-11-02 NOTE — Anesthesia Preprocedure Evaluation (Signed)
Anesthesia Evaluation  Patient identified by MRN, date of birth, ID band Patient awake    Reviewed: Allergy & Precautions, H&P , NPO status , Patient's Chart, lab work & pertinent test results  Airway Mallampati: III TM Distance: <3 FB Neck ROM: Full    Dental  (+) Edentulous Upper, Edentulous Lower   Pulmonary neg pulmonary ROS,  breath sounds clear to auscultation        Cardiovascular hypertension, Pt. on medications + CAD, + Past MI and +CHF + dysrhythmias Atrial Fibrillation + Valvular Problems/Murmurs AS Rhythm:Regular Rate:Normal     Neuro/Psych    GI/Hepatic   Endo/Other    Renal/GU ESRF and DialysisRenal disease     Musculoskeletal   Abdominal   Peds  Hematology   Anesthesia Other Findings   Reproductive/Obstetrics                           Anesthesia Physical Anesthesia Plan  ASA: IV  Anesthesia Plan: MAC   Post-op Pain Management:    Induction: Intravenous  Airway Management Planned: Nasal Cannula  Additional Equipment:   Intra-op Plan:   Post-operative Plan:   Informed Consent: I have reviewed the patients History and Physical, chart, labs and discussed the procedure including the risks, benefits and alternatives for the proposed anesthesia with the patient or authorized representative who has indicated his/her understanding and acceptance.     Plan Discussed with:   Anesthesia Plan Comments:         Anesthesia Quick Evaluation

## 2013-11-02 NOTE — Op Note (Signed)
Patient brought to the operating room and prepped and draped in the usual manner.  Lid speculum inserted in left eye.  Stab incision made at the twelve o'clock position.  Provisc instilled in the anterior chamber.   A 2.4 mm. incision was made temporally.  An anterior capsulotomy was done with a bent 25 gauge needle.  The nucleus was hydrodissected.  The Phaco tip was inserted in the anterior chamber and the nucleus was emulsified.  CDE was 8.78.  The cortical material was then removed with the I and A tip.  Posterior capsule was the polished.  The anterior chamber was deepened with Provisc.  A 26.5 Diopter Rayner 570C IOL was then inserted in the capsular bag.  Provisc was then removed with the I and A tip.  The wound was then hydrated.  Patient sent to the Recovery Room in good condition with follow up in my office.  Preoperative Diagnosis:  Nuclear Cataract OS Postoperative Diagnosis:  Same Procedure name: Kelman Phacoemulsification OS with IOL

## 2013-11-02 NOTE — Discharge Instructions (Signed)
Brittany BadgerLottie V Beard  11/02/2013           [facility] Instructions [address]- [place] [id number] [address]      1. Avoid closing eyes tightly. One often closes the eye tightly when laughing, talking, sneezing, coughing or if they feel irritated. At these times, you should be careful not to close your eyes tightly.  2. Instill eye drops as instructed. To instill drops in your eye, open it, look up and have someone gently pull the lower lid down and instill a couple of drops inside the lower lid.  3. Do not touch upper lid.  4. Take Advil or Tylenol for pain.  5. You may use either eye for near work, such as reading or sewing and you may watch television.  6. You may have your hair done at the beauty parlor at any time.  7. Wear dark glasses with or without your own glasses if you are in bright light.  8. Call our office at [phone] or [phone] if you have sharp pain in your eye or unusual symptoms.  9. Do not be concerned because vision in the operative eye is not good. It will not be good, no matter how successful the operation, until you get a special lens for it. Your old glasses will not be suited to the new eye that was operated on and you will not be ready for a new lens for about a month.  10. [facility].    I have received a copy of the above instructions and will follow them.      Follow up with Dr. Nile RiggsShapiro today between 2-3 PM

## 2013-11-02 NOTE — Op Note (Signed)
Patient brought to the operating room and prepped and draped in the usual manner.  Lid speculum inserted in left eye.  Stab incision made at the twelve o'clock position.  Provisc instilled in the anterior chamber.   A 2.4 mm. incision was made temporally.  An anterior capsulotomy was done with a bent 25 gauge needle.  The nucleus was hydrodissected.  The Phaco tip was inserted in the anterior chamber and the nucleus was emulsified.  CDE was 8.78.  The cortical material was then removed with the I and A tip.  Posterior capsule was the polished.  The anterior chamber was deepened with Provisc.  A 26.5 Diopter Rayner 570C IOL was then inserted in the capsular bag.  Provisc was then removed with the I and A tip.  The wound was then hydrated.  Patient sent to the Recovery Room in good condition with follow up in my office.  Preoperative Diagnosis:  Nuclear Cataract OS Postoperative Diagnosis:  Same Procedure name: Kelman Phacoemulsification OS with IOL 

## 2013-11-03 ENCOUNTER — Encounter (HOSPITAL_COMMUNITY): Payer: Self-pay | Admitting: Ophthalmology

## 2013-11-03 ENCOUNTER — Ambulatory Visit (INDEPENDENT_AMBULATORY_CARE_PROVIDER_SITE_OTHER): Payer: Medicare Other | Admitting: *Deleted

## 2013-11-03 ENCOUNTER — Other Ambulatory Visit: Payer: Self-pay | Admitting: Cardiovascular Disease

## 2013-11-03 DIAGNOSIS — I4891 Unspecified atrial fibrillation: Secondary | ICD-10-CM

## 2013-11-03 DIAGNOSIS — Z7901 Long term (current) use of anticoagulants: Secondary | ICD-10-CM

## 2013-11-03 DIAGNOSIS — I480 Paroxysmal atrial fibrillation: Secondary | ICD-10-CM

## 2013-11-03 DIAGNOSIS — Z5181 Encounter for therapeutic drug level monitoring: Secondary | ICD-10-CM

## 2013-11-03 LAB — POCT INR: INR: 3

## 2013-12-15 ENCOUNTER — Ambulatory Visit (INDEPENDENT_AMBULATORY_CARE_PROVIDER_SITE_OTHER): Payer: Medicare Other | Admitting: *Deleted

## 2013-12-15 DIAGNOSIS — I480 Paroxysmal atrial fibrillation: Secondary | ICD-10-CM

## 2013-12-15 DIAGNOSIS — Z7901 Long term (current) use of anticoagulants: Secondary | ICD-10-CM

## 2013-12-15 DIAGNOSIS — I4891 Unspecified atrial fibrillation: Secondary | ICD-10-CM

## 2013-12-15 DIAGNOSIS — Z5181 Encounter for therapeutic drug level monitoring: Secondary | ICD-10-CM

## 2013-12-15 LAB — POCT INR: INR: 2.8

## 2014-01-26 ENCOUNTER — Ambulatory Visit (INDEPENDENT_AMBULATORY_CARE_PROVIDER_SITE_OTHER): Payer: Medicare Other | Admitting: *Deleted

## 2014-01-26 DIAGNOSIS — Z7901 Long term (current) use of anticoagulants: Secondary | ICD-10-CM

## 2014-01-26 DIAGNOSIS — Z5181 Encounter for therapeutic drug level monitoring: Secondary | ICD-10-CM

## 2014-01-26 DIAGNOSIS — I4891 Unspecified atrial fibrillation: Secondary | ICD-10-CM

## 2014-01-26 DIAGNOSIS — I480 Paroxysmal atrial fibrillation: Secondary | ICD-10-CM

## 2014-01-26 LAB — POCT INR: INR: 1.5

## 2014-02-09 ENCOUNTER — Ambulatory Visit (INDEPENDENT_AMBULATORY_CARE_PROVIDER_SITE_OTHER): Payer: Medicare Other | Admitting: *Deleted

## 2014-02-09 DIAGNOSIS — I4891 Unspecified atrial fibrillation: Secondary | ICD-10-CM

## 2014-02-09 DIAGNOSIS — I480 Paroxysmal atrial fibrillation: Secondary | ICD-10-CM

## 2014-02-09 DIAGNOSIS — Z5181 Encounter for therapeutic drug level monitoring: Secondary | ICD-10-CM

## 2014-02-09 DIAGNOSIS — Z7901 Long term (current) use of anticoagulants: Secondary | ICD-10-CM

## 2014-02-09 LAB — POCT INR: INR: 2.7

## 2014-03-02 ENCOUNTER — Ambulatory Visit (INDEPENDENT_AMBULATORY_CARE_PROVIDER_SITE_OTHER): Payer: Medicare Other | Admitting: *Deleted

## 2014-03-02 DIAGNOSIS — Z7901 Long term (current) use of anticoagulants: Secondary | ICD-10-CM

## 2014-03-02 DIAGNOSIS — I4891 Unspecified atrial fibrillation: Secondary | ICD-10-CM

## 2014-03-02 DIAGNOSIS — Z5181 Encounter for therapeutic drug level monitoring: Secondary | ICD-10-CM

## 2014-03-02 DIAGNOSIS — I480 Paroxysmal atrial fibrillation: Secondary | ICD-10-CM

## 2014-03-02 LAB — POCT INR: INR: 2.3

## 2014-03-11 ENCOUNTER — Other Ambulatory Visit: Payer: Self-pay | Admitting: Internal Medicine

## 2014-03-11 NOTE — Telephone Encounter (Signed)
Rx refill sent to patient pharmacy   

## 2014-03-30 ENCOUNTER — Ambulatory Visit (INDEPENDENT_AMBULATORY_CARE_PROVIDER_SITE_OTHER): Payer: Medicare Other | Admitting: *Deleted

## 2014-03-30 DIAGNOSIS — I4891 Unspecified atrial fibrillation: Secondary | ICD-10-CM

## 2014-03-30 DIAGNOSIS — I480 Paroxysmal atrial fibrillation: Secondary | ICD-10-CM

## 2014-03-30 DIAGNOSIS — Z5181 Encounter for therapeutic drug level monitoring: Secondary | ICD-10-CM

## 2014-03-30 DIAGNOSIS — Z7901 Long term (current) use of anticoagulants: Secondary | ICD-10-CM

## 2014-03-30 LAB — POCT INR: INR: 4

## 2014-04-11 ENCOUNTER — Ambulatory Visit (INDEPENDENT_AMBULATORY_CARE_PROVIDER_SITE_OTHER): Payer: Medicare Other | Admitting: Pharmacist

## 2014-04-11 DIAGNOSIS — I480 Paroxysmal atrial fibrillation: Secondary | ICD-10-CM

## 2014-04-11 DIAGNOSIS — Z7901 Long term (current) use of anticoagulants: Secondary | ICD-10-CM

## 2014-04-11 DIAGNOSIS — I4891 Unspecified atrial fibrillation: Secondary | ICD-10-CM

## 2014-04-11 DIAGNOSIS — Z5181 Encounter for therapeutic drug level monitoring: Secondary | ICD-10-CM

## 2014-04-11 LAB — POCT INR: INR: 2.3

## 2014-05-04 ENCOUNTER — Ambulatory Visit (INDEPENDENT_AMBULATORY_CARE_PROVIDER_SITE_OTHER): Payer: Medicare Other | Admitting: *Deleted

## 2014-05-04 DIAGNOSIS — Z7901 Long term (current) use of anticoagulants: Secondary | ICD-10-CM

## 2014-05-04 DIAGNOSIS — I480 Paroxysmal atrial fibrillation: Secondary | ICD-10-CM

## 2014-05-04 DIAGNOSIS — I4891 Unspecified atrial fibrillation: Secondary | ICD-10-CM

## 2014-05-04 DIAGNOSIS — Z5181 Encounter for therapeutic drug level monitoring: Secondary | ICD-10-CM

## 2014-05-04 LAB — POCT INR: INR: 3.9

## 2014-05-21 ENCOUNTER — Other Ambulatory Visit: Payer: Self-pay | Admitting: Cardiovascular Disease

## 2014-05-25 ENCOUNTER — Ambulatory Visit (INDEPENDENT_AMBULATORY_CARE_PROVIDER_SITE_OTHER): Payer: Medicare Other | Admitting: *Deleted

## 2014-05-25 DIAGNOSIS — Z7901 Long term (current) use of anticoagulants: Secondary | ICD-10-CM

## 2014-05-25 DIAGNOSIS — Z5181 Encounter for therapeutic drug level monitoring: Secondary | ICD-10-CM

## 2014-05-25 DIAGNOSIS — I4891 Unspecified atrial fibrillation: Secondary | ICD-10-CM

## 2014-05-25 DIAGNOSIS — I480 Paroxysmal atrial fibrillation: Secondary | ICD-10-CM

## 2014-05-25 LAB — POCT INR: INR: 4

## 2014-06-08 ENCOUNTER — Ambulatory Visit (INDEPENDENT_AMBULATORY_CARE_PROVIDER_SITE_OTHER): Payer: Medicare Other | Admitting: *Deleted

## 2014-06-08 DIAGNOSIS — Z5181 Encounter for therapeutic drug level monitoring: Secondary | ICD-10-CM

## 2014-06-08 DIAGNOSIS — I480 Paroxysmal atrial fibrillation: Secondary | ICD-10-CM

## 2014-06-08 DIAGNOSIS — Z7901 Long term (current) use of anticoagulants: Secondary | ICD-10-CM

## 2014-06-08 DIAGNOSIS — I4891 Unspecified atrial fibrillation: Secondary | ICD-10-CM

## 2014-06-08 LAB — POCT INR: INR: 1.6

## 2014-06-16 ENCOUNTER — Encounter (HOSPITAL_COMMUNITY): Payer: Self-pay | Admitting: Cardiovascular Disease

## 2014-06-28 ENCOUNTER — Other Ambulatory Visit: Payer: Self-pay | Admitting: Cardiovascular Disease

## 2014-06-28 NOTE — Telephone Encounter (Signed)
Rx(s) sent to pharmacy electronically.  

## 2014-06-29 ENCOUNTER — Ambulatory Visit (INDEPENDENT_AMBULATORY_CARE_PROVIDER_SITE_OTHER): Payer: Medicare Other | Admitting: *Deleted

## 2014-06-29 DIAGNOSIS — Z7901 Long term (current) use of anticoagulants: Secondary | ICD-10-CM

## 2014-06-29 DIAGNOSIS — I480 Paroxysmal atrial fibrillation: Secondary | ICD-10-CM

## 2014-06-29 DIAGNOSIS — I4891 Unspecified atrial fibrillation: Secondary | ICD-10-CM

## 2014-06-29 DIAGNOSIS — Z5181 Encounter for therapeutic drug level monitoring: Secondary | ICD-10-CM

## 2014-06-29 LAB — POCT INR: INR: 1.9

## 2014-07-08 DIAGNOSIS — D631 Anemia in chronic kidney disease: Secondary | ICD-10-CM | POA: Diagnosis not present

## 2014-07-08 DIAGNOSIS — N186 End stage renal disease: Secondary | ICD-10-CM | POA: Diagnosis not present

## 2014-07-08 DIAGNOSIS — N2581 Secondary hyperparathyroidism of renal origin: Secondary | ICD-10-CM | POA: Diagnosis not present

## 2014-07-08 DIAGNOSIS — Z992 Dependence on renal dialysis: Secondary | ICD-10-CM | POA: Diagnosis not present

## 2014-07-11 DIAGNOSIS — Z992 Dependence on renal dialysis: Secondary | ICD-10-CM | POA: Diagnosis not present

## 2014-07-11 DIAGNOSIS — N2581 Secondary hyperparathyroidism of renal origin: Secondary | ICD-10-CM | POA: Diagnosis not present

## 2014-07-11 DIAGNOSIS — D631 Anemia in chronic kidney disease: Secondary | ICD-10-CM | POA: Diagnosis not present

## 2014-07-11 DIAGNOSIS — N186 End stage renal disease: Secondary | ICD-10-CM | POA: Diagnosis not present

## 2014-07-13 DIAGNOSIS — N2581 Secondary hyperparathyroidism of renal origin: Secondary | ICD-10-CM | POA: Diagnosis not present

## 2014-07-13 DIAGNOSIS — D631 Anemia in chronic kidney disease: Secondary | ICD-10-CM | POA: Diagnosis not present

## 2014-07-13 DIAGNOSIS — Z992 Dependence on renal dialysis: Secondary | ICD-10-CM | POA: Diagnosis not present

## 2014-07-13 DIAGNOSIS — N186 End stage renal disease: Secondary | ICD-10-CM | POA: Diagnosis not present

## 2014-07-15 DIAGNOSIS — N186 End stage renal disease: Secondary | ICD-10-CM | POA: Diagnosis not present

## 2014-07-15 DIAGNOSIS — N2581 Secondary hyperparathyroidism of renal origin: Secondary | ICD-10-CM | POA: Diagnosis not present

## 2014-07-15 DIAGNOSIS — D631 Anemia in chronic kidney disease: Secondary | ICD-10-CM | POA: Diagnosis not present

## 2014-07-15 DIAGNOSIS — Z992 Dependence on renal dialysis: Secondary | ICD-10-CM | POA: Diagnosis not present

## 2014-07-18 DIAGNOSIS — N2581 Secondary hyperparathyroidism of renal origin: Secondary | ICD-10-CM | POA: Diagnosis not present

## 2014-07-18 DIAGNOSIS — D631 Anemia in chronic kidney disease: Secondary | ICD-10-CM | POA: Diagnosis not present

## 2014-07-18 DIAGNOSIS — Z992 Dependence on renal dialysis: Secondary | ICD-10-CM | POA: Diagnosis not present

## 2014-07-18 DIAGNOSIS — N186 End stage renal disease: Secondary | ICD-10-CM | POA: Diagnosis not present

## 2014-07-20 ENCOUNTER — Ambulatory Visit (INDEPENDENT_AMBULATORY_CARE_PROVIDER_SITE_OTHER): Payer: Medicare Other | Admitting: *Deleted

## 2014-07-20 DIAGNOSIS — I4891 Unspecified atrial fibrillation: Secondary | ICD-10-CM | POA: Diagnosis not present

## 2014-07-20 DIAGNOSIS — N186 End stage renal disease: Secondary | ICD-10-CM | POA: Diagnosis not present

## 2014-07-20 DIAGNOSIS — Z7901 Long term (current) use of anticoagulants: Secondary | ICD-10-CM | POA: Diagnosis not present

## 2014-07-20 DIAGNOSIS — Z5181 Encounter for therapeutic drug level monitoring: Secondary | ICD-10-CM | POA: Diagnosis not present

## 2014-07-20 DIAGNOSIS — Z992 Dependence on renal dialysis: Secondary | ICD-10-CM | POA: Diagnosis not present

## 2014-07-20 DIAGNOSIS — D631 Anemia in chronic kidney disease: Secondary | ICD-10-CM | POA: Diagnosis not present

## 2014-07-20 DIAGNOSIS — I480 Paroxysmal atrial fibrillation: Secondary | ICD-10-CM | POA: Diagnosis not present

## 2014-07-20 DIAGNOSIS — N2581 Secondary hyperparathyroidism of renal origin: Secondary | ICD-10-CM | POA: Diagnosis not present

## 2014-07-20 LAB — POCT INR: INR: 1.9

## 2014-07-22 DIAGNOSIS — Z992 Dependence on renal dialysis: Secondary | ICD-10-CM | POA: Diagnosis not present

## 2014-07-22 DIAGNOSIS — D631 Anemia in chronic kidney disease: Secondary | ICD-10-CM | POA: Diagnosis not present

## 2014-07-22 DIAGNOSIS — N186 End stage renal disease: Secondary | ICD-10-CM | POA: Diagnosis not present

## 2014-07-22 DIAGNOSIS — N2581 Secondary hyperparathyroidism of renal origin: Secondary | ICD-10-CM | POA: Diagnosis not present

## 2014-07-25 DIAGNOSIS — Z992 Dependence on renal dialysis: Secondary | ICD-10-CM | POA: Diagnosis not present

## 2014-07-25 DIAGNOSIS — D631 Anemia in chronic kidney disease: Secondary | ICD-10-CM | POA: Diagnosis not present

## 2014-07-25 DIAGNOSIS — N186 End stage renal disease: Secondary | ICD-10-CM | POA: Diagnosis not present

## 2014-07-25 DIAGNOSIS — N2581 Secondary hyperparathyroidism of renal origin: Secondary | ICD-10-CM | POA: Diagnosis not present

## 2014-07-27 DIAGNOSIS — Z992 Dependence on renal dialysis: Secondary | ICD-10-CM | POA: Diagnosis not present

## 2014-07-27 DIAGNOSIS — D631 Anemia in chronic kidney disease: Secondary | ICD-10-CM | POA: Diagnosis not present

## 2014-07-27 DIAGNOSIS — N2581 Secondary hyperparathyroidism of renal origin: Secondary | ICD-10-CM | POA: Diagnosis not present

## 2014-07-27 DIAGNOSIS — N186 End stage renal disease: Secondary | ICD-10-CM | POA: Diagnosis not present

## 2014-07-29 DIAGNOSIS — D631 Anemia in chronic kidney disease: Secondary | ICD-10-CM | POA: Diagnosis not present

## 2014-07-29 DIAGNOSIS — N186 End stage renal disease: Secondary | ICD-10-CM | POA: Diagnosis not present

## 2014-07-29 DIAGNOSIS — Z992 Dependence on renal dialysis: Secondary | ICD-10-CM | POA: Diagnosis not present

## 2014-07-29 DIAGNOSIS — N2581 Secondary hyperparathyroidism of renal origin: Secondary | ICD-10-CM | POA: Diagnosis not present

## 2014-08-01 DIAGNOSIS — N2581 Secondary hyperparathyroidism of renal origin: Secondary | ICD-10-CM | POA: Diagnosis not present

## 2014-08-01 DIAGNOSIS — Z992 Dependence on renal dialysis: Secondary | ICD-10-CM | POA: Diagnosis not present

## 2014-08-01 DIAGNOSIS — D631 Anemia in chronic kidney disease: Secondary | ICD-10-CM | POA: Diagnosis not present

## 2014-08-01 DIAGNOSIS — N186 End stage renal disease: Secondary | ICD-10-CM | POA: Diagnosis not present

## 2014-08-03 ENCOUNTER — Other Ambulatory Visit: Payer: Self-pay | Admitting: Cardiovascular Disease

## 2014-08-03 ENCOUNTER — Other Ambulatory Visit: Payer: Self-pay | Admitting: Physician Assistant

## 2014-08-03 ENCOUNTER — Ambulatory Visit (INDEPENDENT_AMBULATORY_CARE_PROVIDER_SITE_OTHER): Payer: Medicare Other | Admitting: *Deleted

## 2014-08-03 DIAGNOSIS — I480 Paroxysmal atrial fibrillation: Secondary | ICD-10-CM

## 2014-08-03 DIAGNOSIS — N186 End stage renal disease: Secondary | ICD-10-CM | POA: Diagnosis not present

## 2014-08-03 DIAGNOSIS — I4891 Unspecified atrial fibrillation: Secondary | ICD-10-CM | POA: Diagnosis not present

## 2014-08-03 DIAGNOSIS — Z992 Dependence on renal dialysis: Secondary | ICD-10-CM | POA: Diagnosis not present

## 2014-08-03 DIAGNOSIS — Z7901 Long term (current) use of anticoagulants: Secondary | ICD-10-CM | POA: Diagnosis not present

## 2014-08-03 DIAGNOSIS — N2581 Secondary hyperparathyroidism of renal origin: Secondary | ICD-10-CM | POA: Diagnosis not present

## 2014-08-03 DIAGNOSIS — Z5181 Encounter for therapeutic drug level monitoring: Secondary | ICD-10-CM | POA: Diagnosis not present

## 2014-08-03 DIAGNOSIS — D631 Anemia in chronic kidney disease: Secondary | ICD-10-CM | POA: Diagnosis not present

## 2014-08-03 LAB — POCT INR: INR: 3.1

## 2014-08-03 NOTE — Telephone Encounter (Signed)
Rx(s) sent to pharmacy electronically. [facility] to [facility] contact [facility] for appointment

## 2014-08-05 ENCOUNTER — Telehealth: Payer: Self-pay | Admitting: Cardiovascular Disease

## 2014-08-05 DIAGNOSIS — N186 End stage renal disease: Secondary | ICD-10-CM | POA: Diagnosis not present

## 2014-08-05 DIAGNOSIS — Z992 Dependence on renal dialysis: Secondary | ICD-10-CM | POA: Diagnosis not present

## 2014-08-05 DIAGNOSIS — N2581 Secondary hyperparathyroidism of renal origin: Secondary | ICD-10-CM | POA: Diagnosis not present

## 2014-08-05 DIAGNOSIS — D631 Anemia in chronic kidney disease: Secondary | ICD-10-CM | POA: Diagnosis not present

## 2014-08-08 DIAGNOSIS — Z992 Dependence on renal dialysis: Secondary | ICD-10-CM | POA: Diagnosis not present

## 2014-08-08 DIAGNOSIS — N186 End stage renal disease: Secondary | ICD-10-CM | POA: Diagnosis not present

## 2014-08-08 DIAGNOSIS — N2581 Secondary hyperparathyroidism of renal origin: Secondary | ICD-10-CM | POA: Diagnosis not present

## 2014-08-08 DIAGNOSIS — D631 Anemia in chronic kidney disease: Secondary | ICD-10-CM | POA: Diagnosis not present

## 2014-08-09 ENCOUNTER — Ambulatory Visit (INDEPENDENT_AMBULATORY_CARE_PROVIDER_SITE_OTHER): Payer: Medicare Other | Admitting: Cardiovascular Disease

## 2014-08-09 ENCOUNTER — Encounter: Payer: Self-pay | Admitting: Cardiovascular Disease

## 2014-08-09 DIAGNOSIS — E785 Hyperlipidemia, unspecified: Secondary | ICD-10-CM

## 2014-08-09 DIAGNOSIS — I350 Nonrheumatic aortic (valve) stenosis: Secondary | ICD-10-CM | POA: Diagnosis not present

## 2014-08-09 DIAGNOSIS — I2510 Atherosclerotic heart disease of native coronary artery without angina pectoris: Secondary | ICD-10-CM

## 2014-08-09 DIAGNOSIS — Z79899 Other long term (current) drug therapy: Secondary | ICD-10-CM | POA: Diagnosis not present

## 2014-08-09 LAB — LIPID PANEL
CHOL/HDL RATIO: 2.7 ratio
Cholesterol: 154 mg/dL (ref 0–200)
HDL: 57 mg/dL (ref >39)
LDL Cholesterol: 79 mg/dL (ref 0–99)
TRIGLYCERIDES: 90 mg/dL (ref <150)
VLDL: 18 mg/dL (ref 0–40)

## 2014-08-09 LAB — HEPATIC FUNCTION PANEL
ALBUMIN: 3.6 g/dL (ref 3.5–5.2)
ALT: 8 U/L (ref 0–35)
AST: 16 U/L (ref 0–37)
Alkaline Phosphatase: 57 U/L (ref 39–117)
BILIRUBIN DIRECT: 0.2 mg/dL (ref 0.0–0.3)
Indirect Bilirubin: 0.5 mg/dL (ref 0.2–1.2)
TOTAL PROTEIN: 6.8 g/dL (ref 6.0–8.3)
Total Bilirubin: 0.7 mg/dL (ref 0.2–1.2)

## 2014-08-09 NOTE — Assessment & Plan Note (Signed)
History of CAD status post non-STEMI with difficult complex interventions subsequent to that requiring high speed rotational atherectomy, stenting of a calcified mid right coronary artery as well as circumflex obtuse marginal branch 05/19/13. She denies chest pain or shortness of breath.

## 2014-08-09 NOTE — Patient Instructions (Signed)
  We will see you back in follow up in 1 year with Dr Berry.   Dr Berry has ordered: 1.  Echocardiogram. Echocardiography is a painless test that uses sound waves to create images of your heart. It provides your doctor with information about the size and shape of your heart and how well your heart's chambers and valves are working. This procedure takes approximately one hour. There are no restrictions for this procedure.   2. A FASTING lipid profile: to be done at your convenience.  There is a [facility] lab on the first floor of this building, [address].  They are open from 8am-5pm with a lunch from 12-2.  You do not need an appointment.      

## 2014-08-09 NOTE — Progress Notes (Signed)
08/09/2014 Brittany BadgerLottie V Beard   04/27/1928  [id number]  Primary Physician Evlyn CourierHILL,GERALD K, MD Primary Cardiologist: Runell GessJonathan J. Annaston Upham MD Roseanne RenoFACP,FACC,FAHA, FSCAI   HPI:  [name] is an 81 year old frail appearing married African-American female who lives with her husband and son. She is accompanied by her daughter today. She has a history of hypertension, hyperlipidemia, diabetes and chronic renal insufficiency on hemodialysis. She also has moderate aortic stenosis. She has paroxysmal atrial fibrillation maintaining sinus rhythm on amiodarone and Coumadin anticoagulation. She had a non-STEMI in 2014 with complex intervention involving the calcified mid RCA and circumflex obtuse marginal branch.   Current Outpatient Prescriptions  Medication Sig Dispense Refill  . amiodarone (PACERONE) 200 MG tablet TAKE ONE (1) TABLET BY MOUTH EVERY DAY 30 tablet 5  . aspirin 81 MG EC tablet Take 81 mg by mouth daily.    Marland Kitchen. atorvastatin (LIPITOR) 40 MG tablet Take 40 mg by mouth daily.      . clopidogrel (PLAVIX) 75 MG tablet Take 1 tablet (75 mg total) by mouth daily. <please make appointment for refills> 30 tablet 0  . darbepoetin (ARANESP) 100 MCG/0.5ML SOLN injection Inject 100 mcg into the skin See admin instructions. Takes every Monday, Wednesday and Friday with dialysis    . doxercalciferol (HECTOROL) 4 MCG/2ML injection Inject 3.5 mcg into the vein every Monday, Wednesday, and Friday with hemodialysis.    Marland Kitchen. metoprolol tartrate (LOPRESSOR) 25 MG tablet TAKE ONE TABLET BY MOUTH TWICE A DAY 60 tablet 5  . sevelamer carbonate (RENVELA) 800 MG tablet Take 2,400 mg by mouth 3 (three) times daily with meals.    . sodium bicarbonate 325 MG tablet Take 325 mg by mouth 2 (two) times daily.    Marland Kitchen. warfarin (COUMADIN) 3 MG tablet Take 1.5-3 mg by mouth daily. Takes 3 mg on Sun, Tue, Thu, and Sat. Takes 1.5mg  on Mon, Wed, Fri.    . warfarin (COUMADIN) 3 MG tablet TAKE ONE TABLET (3MG ) ONCE DAILY 30 tablet 1  .  warfarin (COUMADIN) 3 MG tablet TAKE ONE (1) TABLET EACH DAY 30 tablet 3   No current facility-administered medications for this visit.    No Known Allergies  History   Social History  . Marital Status: Married    Spouse Name: N/A    Number of Children: N/A  . Years of Education: N/A   Occupational History  . Not on file.   Social History Main Topics  . Smoking status: Never Smoker   . Smokeless tobacco: Never Used  . Alcohol Use: No  . Drug Use: No  . Sexual Activity: Yes    Birth Control/ Protection: Post-menopausal   Other Topics Concern  . Not on file   Social History Narrative     Review of Systems: General: negative for chills, fever, night sweats or weight changes.  Cardiovascular: negative for chest pain, dyspnea on exertion, edema, orthopnea, palpitations, paroxysmal nocturnal dyspnea or shortness of breath Dermatological: negative for rash Respiratory: negative for cough or wheezing Urologic: negative for hematuria Abdominal: negative for nausea, vomiting, diarrhea, bright red blood per rectum, melena, or hematemesis Neurologic: negative for visual changes, syncope, or dizziness All other systems reviewed and are otherwise negative except as noted above.    Blood pressure 140/62, pulse 71, height 5\' 4"  (1.626 m), weight 122 lb 3.2 oz (55.43 kg).  General appearance: alert and no distress Neck: no adenopathy, no JVD, supple, symmetrical, trachea midline, thyroid not enlarged, symmetric, no tenderness/mass/nodules and bilateral carotid bruits  versus transmitted murmur Lungs: clear to auscultation bilaterally Heart: 3/6 outflow tract murmur consistent with aortic stenosis Extremities: extremities normal, atraumatic, no cyanosis or edema  EKG normal sinus rhythm at 71 with nonspecific ST and T-wave changes. I personally reviewed this EKG  ASSESSMENT AND PLAN:   PAF (paroxysmal atrial fibrillation)- Amiodarone - NSR History of PAF maintaining sinus rhythm  on Coumadin anticoagulation, beta blocker and amiodarone. Continue current medications at current dosing   Moderate aortic stenosis- EF 60-65% 05/08/13 History of moderate aortic stenosis measured by 2-D echo 05/08/13 with a valve area of 1.2 cm. She had normal LV function. She has a loud outflow tract murmur. We will recheck a 2-D echocardiogram.   HTN (hypertension) History of hypertension blood pressure measured at 140/62. She is on metoprolol. Continue current meds at current dosing   Dyslipidemia History of hyperlipidemia on atorvastatin 40 mg a day followed by her PCP   CAD- RCA HSRA/DES 05/15/13-  CFX/OM1 DES 05/19/13 History of CAD status post non-STEMI with difficult complex interventions subsequent to that requiring high speed rotational atherectomy, stenting of a calcified mid right coronary artery as well as circumflex obtuse marginal branch 05/19/13. She denies chest pain or shortness of breath.       Runell Gess MD FACP,FACC,FAHA, [facility] 08/09/2014 9:21 AM

## 2014-08-09 NOTE — Assessment & Plan Note (Signed)
History of hypertension blood pressure measured at 140/62. She is on metoprolol. Continue current meds at current dosing

## 2014-08-09 NOTE — Assessment & Plan Note (Signed)
History of PAF maintaining sinus rhythm on Coumadin anticoagulation, beta blocker and amiodarone. Continue current medications at current dosing

## 2014-08-09 NOTE — Assessment & Plan Note (Signed)
History of hyperlipidemia on atorvastatin 40 mg a day followed by her PCP 

## 2014-08-09 NOTE — Assessment & Plan Note (Signed)
History of moderate aortic stenosis measured by 2-D echo 05/08/13 with a valve area of 1.2 cm. She had normal LV function. She has a loud outflow tract murmur. We will recheck a 2-D echocardiogram.

## 2014-08-10 DIAGNOSIS — N2581 Secondary hyperparathyroidism of renal origin: Secondary | ICD-10-CM | POA: Diagnosis not present

## 2014-08-10 DIAGNOSIS — Z992 Dependence on renal dialysis: Secondary | ICD-10-CM | POA: Diagnosis not present

## 2014-08-10 DIAGNOSIS — D631 Anemia in chronic kidney disease: Secondary | ICD-10-CM | POA: Diagnosis not present

## 2014-08-10 DIAGNOSIS — N186 End stage renal disease: Secondary | ICD-10-CM | POA: Diagnosis not present

## 2014-08-12 DIAGNOSIS — N186 End stage renal disease: Secondary | ICD-10-CM | POA: Diagnosis not present

## 2014-08-12 DIAGNOSIS — N2581 Secondary hyperparathyroidism of renal origin: Secondary | ICD-10-CM | POA: Diagnosis not present

## 2014-08-12 DIAGNOSIS — D631 Anemia in chronic kidney disease: Secondary | ICD-10-CM | POA: Diagnosis not present

## 2014-08-12 DIAGNOSIS — Z992 Dependence on renal dialysis: Secondary | ICD-10-CM | POA: Diagnosis not present

## 2014-08-15 DIAGNOSIS — Z992 Dependence on renal dialysis: Secondary | ICD-10-CM | POA: Diagnosis not present

## 2014-08-15 DIAGNOSIS — N2581 Secondary hyperparathyroidism of renal origin: Secondary | ICD-10-CM | POA: Diagnosis not present

## 2014-08-15 DIAGNOSIS — N186 End stage renal disease: Secondary | ICD-10-CM | POA: Diagnosis not present

## 2014-08-15 DIAGNOSIS — D631 Anemia in chronic kidney disease: Secondary | ICD-10-CM | POA: Diagnosis not present

## 2014-08-17 ENCOUNTER — Encounter: Payer: Self-pay | Admitting: *Deleted

## 2014-08-17 DIAGNOSIS — N186 End stage renal disease: Secondary | ICD-10-CM | POA: Diagnosis not present

## 2014-08-17 DIAGNOSIS — Z992 Dependence on renal dialysis: Secondary | ICD-10-CM | POA: Diagnosis not present

## 2014-08-17 DIAGNOSIS — N2581 Secondary hyperparathyroidism of renal origin: Secondary | ICD-10-CM | POA: Diagnosis not present

## 2014-08-17 DIAGNOSIS — D631 Anemia in chronic kidney disease: Secondary | ICD-10-CM | POA: Diagnosis not present

## 2014-08-17 NOTE — Telephone Encounter (Signed)
Closed encounter °

## 2014-08-18 ENCOUNTER — Ambulatory Visit (HOSPITAL_COMMUNITY)
Admission: RE | Admit: 2014-08-18 | Discharge: 2014-08-18 | Disposition: A | Discharge status: Home / Self Care | Payer: Medicare Other | Source: Ambulatory Visit | Attending: Internal Medicine | Admitting: Internal Medicine

## 2014-08-18 DIAGNOSIS — I350 Nonrheumatic aortic (valve) stenosis: Secondary | ICD-10-CM | POA: Diagnosis not present

## 2014-08-18 NOTE — Progress Notes (Signed)
2D Echocardiogram Complete.  08/18/2014   Jeanne Terrance, RDCS  

## 2014-08-19 DIAGNOSIS — N2581 Secondary hyperparathyroidism of renal origin: Secondary | ICD-10-CM | POA: Diagnosis not present

## 2014-08-19 DIAGNOSIS — Z992 Dependence on renal dialysis: Secondary | ICD-10-CM | POA: Diagnosis not present

## 2014-08-19 DIAGNOSIS — N186 End stage renal disease: Secondary | ICD-10-CM | POA: Diagnosis not present

## 2014-08-19 DIAGNOSIS — D631 Anemia in chronic kidney disease: Secondary | ICD-10-CM | POA: Diagnosis not present

## 2014-08-22 DIAGNOSIS — N186 End stage renal disease: Secondary | ICD-10-CM | POA: Diagnosis not present

## 2014-08-22 DIAGNOSIS — Z992 Dependence on renal dialysis: Secondary | ICD-10-CM | POA: Diagnosis not present

## 2014-08-22 DIAGNOSIS — N2581 Secondary hyperparathyroidism of renal origin: Secondary | ICD-10-CM | POA: Diagnosis not present

## 2014-08-22 DIAGNOSIS — D631 Anemia in chronic kidney disease: Secondary | ICD-10-CM | POA: Diagnosis not present

## 2014-08-24 ENCOUNTER — Ambulatory Visit (INDEPENDENT_AMBULATORY_CARE_PROVIDER_SITE_OTHER): Payer: Medicare Other | Admitting: *Deleted

## 2014-08-24 DIAGNOSIS — D631 Anemia in chronic kidney disease: Secondary | ICD-10-CM | POA: Diagnosis not present

## 2014-08-24 DIAGNOSIS — I480 Paroxysmal atrial fibrillation: Secondary | ICD-10-CM

## 2014-08-24 DIAGNOSIS — Z7901 Long term (current) use of anticoagulants: Secondary | ICD-10-CM

## 2014-08-24 DIAGNOSIS — I4891 Unspecified atrial fibrillation: Secondary | ICD-10-CM | POA: Diagnosis not present

## 2014-08-24 DIAGNOSIS — N186 End stage renal disease: Secondary | ICD-10-CM | POA: Diagnosis not present

## 2014-08-24 DIAGNOSIS — N2581 Secondary hyperparathyroidism of renal origin: Secondary | ICD-10-CM | POA: Diagnosis not present

## 2014-08-24 DIAGNOSIS — Z5181 Encounter for therapeutic drug level monitoring: Secondary | ICD-10-CM

## 2014-08-24 DIAGNOSIS — Z992 Dependence on renal dialysis: Secondary | ICD-10-CM | POA: Diagnosis not present

## 2014-08-24 LAB — POCT INR: INR: 2.7

## 2014-08-26 DIAGNOSIS — N2581 Secondary hyperparathyroidism of renal origin: Secondary | ICD-10-CM | POA: Diagnosis not present

## 2014-08-26 DIAGNOSIS — N186 End stage renal disease: Secondary | ICD-10-CM | POA: Diagnosis not present

## 2014-08-26 DIAGNOSIS — Z992 Dependence on renal dialysis: Secondary | ICD-10-CM | POA: Diagnosis not present

## 2014-08-26 DIAGNOSIS — D631 Anemia in chronic kidney disease: Secondary | ICD-10-CM | POA: Diagnosis not present

## 2014-08-29 DIAGNOSIS — D631 Anemia in chronic kidney disease: Secondary | ICD-10-CM | POA: Diagnosis not present

## 2014-08-29 DIAGNOSIS — N186 End stage renal disease: Secondary | ICD-10-CM | POA: Diagnosis not present

## 2014-08-29 DIAGNOSIS — N2581 Secondary hyperparathyroidism of renal origin: Secondary | ICD-10-CM | POA: Diagnosis not present

## 2014-08-29 DIAGNOSIS — Z992 Dependence on renal dialysis: Secondary | ICD-10-CM | POA: Diagnosis not present

## 2014-08-31 DIAGNOSIS — N2581 Secondary hyperparathyroidism of renal origin: Secondary | ICD-10-CM | POA: Diagnosis not present

## 2014-08-31 DIAGNOSIS — D631 Anemia in chronic kidney disease: Secondary | ICD-10-CM | POA: Diagnosis not present

## 2014-08-31 DIAGNOSIS — Z992 Dependence on renal dialysis: Secondary | ICD-10-CM | POA: Diagnosis not present

## 2014-08-31 DIAGNOSIS — N186 End stage renal disease: Secondary | ICD-10-CM | POA: Diagnosis not present

## 2014-09-01 ENCOUNTER — Other Ambulatory Visit: Payer: Self-pay | Admitting: Cardiovascular Disease

## 2014-09-01 NOTE — Telephone Encounter (Signed)
Rx(s) sent to pharmacy electronically.  

## 2014-09-02 DIAGNOSIS — N186 End stage renal disease: Secondary | ICD-10-CM | POA: Diagnosis not present

## 2014-09-02 DIAGNOSIS — Z992 Dependence on renal dialysis: Secondary | ICD-10-CM | POA: Diagnosis not present

## 2014-09-02 DIAGNOSIS — D631 Anemia in chronic kidney disease: Secondary | ICD-10-CM | POA: Diagnosis not present

## 2014-09-02 DIAGNOSIS — N2581 Secondary hyperparathyroidism of renal origin: Secondary | ICD-10-CM | POA: Diagnosis not present

## 2014-09-05 DIAGNOSIS — N186 End stage renal disease: Secondary | ICD-10-CM | POA: Diagnosis not present

## 2014-09-05 DIAGNOSIS — Z992 Dependence on renal dialysis: Secondary | ICD-10-CM | POA: Diagnosis not present

## 2014-09-05 DIAGNOSIS — N2581 Secondary hyperparathyroidism of renal origin: Secondary | ICD-10-CM | POA: Diagnosis not present

## 2014-09-05 DIAGNOSIS — D631 Anemia in chronic kidney disease: Secondary | ICD-10-CM | POA: Diagnosis not present

## 2014-09-07 DIAGNOSIS — D631 Anemia in chronic kidney disease: Secondary | ICD-10-CM | POA: Diagnosis not present

## 2014-09-07 DIAGNOSIS — Z992 Dependence on renal dialysis: Secondary | ICD-10-CM | POA: Diagnosis not present

## 2014-09-07 DIAGNOSIS — N2581 Secondary hyperparathyroidism of renal origin: Secondary | ICD-10-CM | POA: Diagnosis not present

## 2014-09-07 DIAGNOSIS — N186 End stage renal disease: Secondary | ICD-10-CM | POA: Diagnosis not present

## 2014-09-09 DIAGNOSIS — D631 Anemia in chronic kidney disease: Secondary | ICD-10-CM | POA: Diagnosis not present

## 2014-09-09 DIAGNOSIS — Z992 Dependence on renal dialysis: Secondary | ICD-10-CM | POA: Diagnosis not present

## 2014-09-09 DIAGNOSIS — N2581 Secondary hyperparathyroidism of renal origin: Secondary | ICD-10-CM | POA: Diagnosis not present

## 2014-09-09 DIAGNOSIS — N186 End stage renal disease: Secondary | ICD-10-CM | POA: Diagnosis not present

## 2014-09-12 DIAGNOSIS — Z992 Dependence on renal dialysis: Secondary | ICD-10-CM | POA: Diagnosis not present

## 2014-09-12 DIAGNOSIS — N2581 Secondary hyperparathyroidism of renal origin: Secondary | ICD-10-CM | POA: Diagnosis not present

## 2014-09-12 DIAGNOSIS — N186 End stage renal disease: Secondary | ICD-10-CM | POA: Diagnosis not present

## 2014-09-12 DIAGNOSIS — D631 Anemia in chronic kidney disease: Secondary | ICD-10-CM | POA: Diagnosis not present

## 2014-09-14 DIAGNOSIS — N2581 Secondary hyperparathyroidism of renal origin: Secondary | ICD-10-CM | POA: Diagnosis not present

## 2014-09-14 DIAGNOSIS — D631 Anemia in chronic kidney disease: Secondary | ICD-10-CM | POA: Diagnosis not present

## 2014-09-14 DIAGNOSIS — Z992 Dependence on renal dialysis: Secondary | ICD-10-CM | POA: Diagnosis not present

## 2014-09-14 DIAGNOSIS — N186 End stage renal disease: Secondary | ICD-10-CM | POA: Diagnosis not present

## 2014-09-15 DIAGNOSIS — I739 Peripheral vascular disease, unspecified: Secondary | ICD-10-CM | POA: Diagnosis not present

## 2014-09-15 DIAGNOSIS — L110 Acquired keratosis follicularis: Secondary | ICD-10-CM | POA: Diagnosis not present

## 2014-09-15 DIAGNOSIS — B351 Tinea unguium: Secondary | ICD-10-CM | POA: Diagnosis not present

## 2014-09-16 DIAGNOSIS — N2581 Secondary hyperparathyroidism of renal origin: Secondary | ICD-10-CM | POA: Diagnosis not present

## 2014-09-16 DIAGNOSIS — N186 End stage renal disease: Secondary | ICD-10-CM | POA: Diagnosis not present

## 2014-09-16 DIAGNOSIS — D631 Anemia in chronic kidney disease: Secondary | ICD-10-CM | POA: Diagnosis not present

## 2014-09-16 DIAGNOSIS — Z992 Dependence on renal dialysis: Secondary | ICD-10-CM | POA: Diagnosis not present

## 2014-09-19 DIAGNOSIS — D631 Anemia in chronic kidney disease: Secondary | ICD-10-CM | POA: Diagnosis not present

## 2014-09-19 DIAGNOSIS — N2581 Secondary hyperparathyroidism of renal origin: Secondary | ICD-10-CM | POA: Diagnosis not present

## 2014-09-19 DIAGNOSIS — N186 End stage renal disease: Secondary | ICD-10-CM | POA: Diagnosis not present

## 2014-09-19 DIAGNOSIS — Z992 Dependence on renal dialysis: Secondary | ICD-10-CM | POA: Diagnosis not present

## 2014-09-21 ENCOUNTER — Ambulatory Visit (INDEPENDENT_AMBULATORY_CARE_PROVIDER_SITE_OTHER): Payer: Medicare Other | Admitting: *Deleted

## 2014-09-21 DIAGNOSIS — I4891 Unspecified atrial fibrillation: Secondary | ICD-10-CM | POA: Diagnosis not present

## 2014-09-21 DIAGNOSIS — Z7901 Long term (current) use of anticoagulants: Secondary | ICD-10-CM | POA: Diagnosis not present

## 2014-09-21 DIAGNOSIS — N186 End stage renal disease: Secondary | ICD-10-CM | POA: Diagnosis not present

## 2014-09-21 DIAGNOSIS — Z5181 Encounter for therapeutic drug level monitoring: Secondary | ICD-10-CM | POA: Diagnosis not present

## 2014-09-21 DIAGNOSIS — I480 Paroxysmal atrial fibrillation: Secondary | ICD-10-CM

## 2014-09-21 DIAGNOSIS — Z992 Dependence on renal dialysis: Secondary | ICD-10-CM | POA: Diagnosis not present

## 2014-09-21 DIAGNOSIS — D631 Anemia in chronic kidney disease: Secondary | ICD-10-CM | POA: Diagnosis not present

## 2014-09-21 DIAGNOSIS — N2581 Secondary hyperparathyroidism of renal origin: Secondary | ICD-10-CM | POA: Diagnosis not present

## 2014-09-21 LAB — POCT INR: INR: 2.9

## 2014-09-23 DIAGNOSIS — Z992 Dependence on renal dialysis: Secondary | ICD-10-CM | POA: Diagnosis not present

## 2014-09-23 DIAGNOSIS — N2581 Secondary hyperparathyroidism of renal origin: Secondary | ICD-10-CM | POA: Diagnosis not present

## 2014-09-23 DIAGNOSIS — D631 Anemia in chronic kidney disease: Secondary | ICD-10-CM | POA: Diagnosis not present

## 2014-09-23 DIAGNOSIS — N186 End stage renal disease: Secondary | ICD-10-CM | POA: Diagnosis not present

## 2014-09-26 DIAGNOSIS — N2581 Secondary hyperparathyroidism of renal origin: Secondary | ICD-10-CM | POA: Diagnosis not present

## 2014-09-26 DIAGNOSIS — N186 End stage renal disease: Secondary | ICD-10-CM | POA: Diagnosis not present

## 2014-09-26 DIAGNOSIS — D631 Anemia in chronic kidney disease: Secondary | ICD-10-CM | POA: Diagnosis not present

## 2014-09-26 DIAGNOSIS — Z992 Dependence on renal dialysis: Secondary | ICD-10-CM | POA: Diagnosis not present

## 2014-09-27 ENCOUNTER — Encounter: Payer: Self-pay | Admitting: *Deleted

## 2014-09-28 DIAGNOSIS — N186 End stage renal disease: Secondary | ICD-10-CM | POA: Diagnosis not present

## 2014-09-28 DIAGNOSIS — Z992 Dependence on renal dialysis: Secondary | ICD-10-CM | POA: Diagnosis not present

## 2014-09-28 DIAGNOSIS — N2581 Secondary hyperparathyroidism of renal origin: Secondary | ICD-10-CM | POA: Diagnosis not present

## 2014-09-28 DIAGNOSIS — D631 Anemia in chronic kidney disease: Secondary | ICD-10-CM | POA: Diagnosis not present

## 2014-09-30 DIAGNOSIS — N2581 Secondary hyperparathyroidism of renal origin: Secondary | ICD-10-CM | POA: Diagnosis not present

## 2014-09-30 DIAGNOSIS — N186 End stage renal disease: Secondary | ICD-10-CM | POA: Diagnosis not present

## 2014-09-30 DIAGNOSIS — D631 Anemia in chronic kidney disease: Secondary | ICD-10-CM | POA: Diagnosis not present

## 2014-09-30 DIAGNOSIS — Z992 Dependence on renal dialysis: Secondary | ICD-10-CM | POA: Diagnosis not present

## 2014-10-03 DIAGNOSIS — N2581 Secondary hyperparathyroidism of renal origin: Secondary | ICD-10-CM | POA: Diagnosis not present

## 2014-10-03 DIAGNOSIS — D631 Anemia in chronic kidney disease: Secondary | ICD-10-CM | POA: Diagnosis not present

## 2014-10-03 DIAGNOSIS — Z992 Dependence on renal dialysis: Secondary | ICD-10-CM | POA: Diagnosis not present

## 2014-10-03 DIAGNOSIS — N186 End stage renal disease: Secondary | ICD-10-CM | POA: Diagnosis not present

## 2014-10-04 ENCOUNTER — Telehealth: Payer: Self-pay | Admitting: *Deleted

## 2014-10-04 ENCOUNTER — Ambulatory Visit (INDEPENDENT_AMBULATORY_CARE_PROVIDER_SITE_OTHER): Payer: Medicare Other | Admitting: Cardiovascular Disease

## 2014-10-04 ENCOUNTER — Encounter: Payer: Self-pay | Admitting: Cardiovascular Disease

## 2014-10-04 VITALS — BP 102/64 | HR 76 | Ht 65.0 in | Wt 119.0 lb

## 2014-10-04 DIAGNOSIS — I350 Nonrheumatic aortic (valve) stenosis: Secondary | ICD-10-CM | POA: Diagnosis not present

## 2014-10-04 NOTE — Patient Instructions (Signed)
Your physician wants you to follow-up in: 3 months with Dr Allyson SabalBerry. You will receive a reminder letter in the mail two months in advance. If you don't receive a letter, please call our office to schedule the follow-up appointment.  Dr Allyson SabalBerry has referred you to Dr Excell Seltzerooper in the TAVR clinic.

## 2014-10-04 NOTE — Telephone Encounter (Signed)
Pt being referred for TAVR consult.  Dr. Clifton JamesMcAlhany can see pt in office on April 12 or April 14 at 1:45.  I placed call to pt to schedule this appt and left message with person who answered her home phone to have pt call office.

## 2014-10-04 NOTE — Assessment & Plan Note (Signed)
[name] returns for follow-up of her 2-D echo showed normal LV function and progression of her left ear canal in the severe range with a valve area of approximate 0.7 cm squared. Her peak gradient has increased. She is limited and has dyspnea on exertion. I do not think she is a good open AVR candidate. I'm referring her to Dr. Excell Seltzerooper in the TAVR clinic for further evaluation.

## 2014-10-04 NOTE — Progress Notes (Signed)
Mrs. Maisie Fushomas returns today for follow-up of her 2-D echo. Her aortic stenosis has progressed now to the severe range with increase in her treatment transvalvular gradient and worsening of her symptoms. Her valve area is 0.7 cm squared. I do not think she has a optimal open AVR candidate. She may benefit from TAVR. She does have chronic renal insufficiency on hemodialysis. I'm referring her to Dr. Excell Seltzerooper for further evaluation.   Runell GessJonathan J. Daishon Chui, [name], FACP, [facility], Earl LagosFAHA, [facility] [address] [place], [place]  [id number]  [phone] 10/04/2014 3:33 PM

## 2014-10-05 DIAGNOSIS — N2581 Secondary hyperparathyroidism of renal origin: Secondary | ICD-10-CM | POA: Diagnosis not present

## 2014-10-05 DIAGNOSIS — N186 End stage renal disease: Secondary | ICD-10-CM | POA: Diagnosis not present

## 2014-10-05 DIAGNOSIS — D631 Anemia in chronic kidney disease: Secondary | ICD-10-CM | POA: Diagnosis not present

## 2014-10-05 DIAGNOSIS — Z992 Dependence on renal dialysis: Secondary | ICD-10-CM | POA: Diagnosis not present

## 2014-10-06 DIAGNOSIS — N186 End stage renal disease: Secondary | ICD-10-CM | POA: Diagnosis not present

## 2014-10-06 DIAGNOSIS — Z992 Dependence on renal dialysis: Secondary | ICD-10-CM | POA: Diagnosis not present

## 2014-10-07 DIAGNOSIS — J189 Pneumonia, unspecified organism: Secondary | ICD-10-CM

## 2014-10-07 DIAGNOSIS — N2581 Secondary hyperparathyroidism of renal origin: Secondary | ICD-10-CM | POA: Diagnosis not present

## 2014-10-07 DIAGNOSIS — D631 Anemia in chronic kidney disease: Secondary | ICD-10-CM | POA: Diagnosis not present

## 2014-10-07 DIAGNOSIS — Z992 Dependence on renal dialysis: Secondary | ICD-10-CM | POA: Diagnosis not present

## 2014-10-07 DIAGNOSIS — N186 End stage renal disease: Secondary | ICD-10-CM | POA: Diagnosis not present

## 2014-10-07 HISTORY — DX: Pneumonia, unspecified organism: J18.9

## 2014-10-07 NOTE — Telephone Encounter (Signed)
Spoke with pt's daughter and appt made for pt to see Dr. Clifton JamesMcAlhany on October 18, 2014 at 1:45

## 2014-10-10 DIAGNOSIS — D631 Anemia in chronic kidney disease: Secondary | ICD-10-CM | POA: Diagnosis not present

## 2014-10-10 DIAGNOSIS — N2581 Secondary hyperparathyroidism of renal origin: Secondary | ICD-10-CM | POA: Diagnosis not present

## 2014-10-10 DIAGNOSIS — N186 End stage renal disease: Secondary | ICD-10-CM | POA: Diagnosis not present

## 2014-10-10 DIAGNOSIS — Z992 Dependence on renal dialysis: Secondary | ICD-10-CM | POA: Diagnosis not present

## 2014-10-12 ENCOUNTER — Encounter (HOSPITAL_COMMUNITY): Payer: Self-pay | Admitting: Emergency Medicine

## 2014-10-12 ENCOUNTER — Inpatient Hospital Stay (HOSPITAL_COMMUNITY)
Admission: EM | Admit: 2014-10-12 | Discharge: 2014-10-15 | DRG: 871 | Disposition: A | Discharge status: Home / Self Care | Payer: Medicare Other | Attending: Internal Medicine | Admitting: Internal Medicine

## 2014-10-12 DIAGNOSIS — I481 Persistent atrial fibrillation: Secondary | ICD-10-CM | POA: Diagnosis not present

## 2014-10-12 DIAGNOSIS — I248 Other forms of acute ischemic heart disease: Secondary | ICD-10-CM | POA: Diagnosis present

## 2014-10-12 DIAGNOSIS — Z992 Dependence on renal dialysis: Secondary | ICD-10-CM

## 2014-10-12 DIAGNOSIS — Z9841 Cataract extraction status, right eye: Secondary | ICD-10-CM | POA: Diagnosis not present

## 2014-10-12 DIAGNOSIS — I120 Hypertensive chronic kidney disease with stage 5 chronic kidney disease or end stage renal disease: Secondary | ICD-10-CM | POA: Diagnosis present

## 2014-10-12 DIAGNOSIS — I350 Nonrheumatic aortic (valve) stenosis: Secondary | ICD-10-CM | POA: Diagnosis present

## 2014-10-12 DIAGNOSIS — J189 Pneumonia, unspecified organism: Secondary | ICD-10-CM | POA: Diagnosis not present

## 2014-10-12 DIAGNOSIS — A419 Sepsis, unspecified organism: Principal | ICD-10-CM | POA: Diagnosis present

## 2014-10-12 DIAGNOSIS — Z955 Presence of coronary angioplasty implant and graft: Secondary | ICD-10-CM | POA: Diagnosis not present

## 2014-10-12 DIAGNOSIS — R0602 Shortness of breath: Secondary | ICD-10-CM

## 2014-10-12 DIAGNOSIS — I2510 Atherosclerotic heart disease of native coronary artery without angina pectoris: Secondary | ICD-10-CM | POA: Diagnosis present

## 2014-10-12 DIAGNOSIS — D649 Anemia, unspecified: Secondary | ICD-10-CM | POA: Diagnosis not present

## 2014-10-12 DIAGNOSIS — N186 End stage renal disease: Secondary | ICD-10-CM | POA: Diagnosis present

## 2014-10-12 DIAGNOSIS — I5032 Chronic diastolic (congestive) heart failure: Secondary | ICD-10-CM | POA: Diagnosis present

## 2014-10-12 DIAGNOSIS — E785 Hyperlipidemia, unspecified: Secondary | ICD-10-CM | POA: Diagnosis not present

## 2014-10-12 DIAGNOSIS — J9601 Acute respiratory failure with hypoxia: Secondary | ICD-10-CM | POA: Diagnosis not present

## 2014-10-12 DIAGNOSIS — I480 Paroxysmal atrial fibrillation: Secondary | ICD-10-CM | POA: Diagnosis present

## 2014-10-12 DIAGNOSIS — I4891 Unspecified atrial fibrillation: Secondary | ICD-10-CM | POA: Diagnosis not present

## 2014-10-12 DIAGNOSIS — R0789 Other chest pain: Secondary | ICD-10-CM | POA: Diagnosis not present

## 2014-10-12 DIAGNOSIS — Z7982 Long term (current) use of aspirin: Secondary | ICD-10-CM | POA: Diagnosis not present

## 2014-10-12 DIAGNOSIS — Z7901 Long term (current) use of anticoagulants: Secondary | ICD-10-CM

## 2014-10-12 DIAGNOSIS — Z961 Presence of intraocular lens: Secondary | ICD-10-CM | POA: Diagnosis present

## 2014-10-12 DIAGNOSIS — E8779 Other fluid overload: Secondary | ICD-10-CM

## 2014-10-12 DIAGNOSIS — I252 Old myocardial infarction: Secondary | ICD-10-CM

## 2014-10-12 DIAGNOSIS — E861 Hypovolemia: Secondary | ICD-10-CM | POA: Diagnosis not present

## 2014-10-12 DIAGNOSIS — D631 Anemia in chronic kidney disease: Secondary | ICD-10-CM | POA: Diagnosis not present

## 2014-10-12 DIAGNOSIS — R079 Chest pain, unspecified: Secondary | ICD-10-CM

## 2014-10-12 DIAGNOSIS — E876 Hypokalemia: Secondary | ICD-10-CM | POA: Diagnosis not present

## 2014-10-12 DIAGNOSIS — N2581 Secondary hyperparathyroidism of renal origin: Secondary | ICD-10-CM | POA: Diagnosis not present

## 2014-10-12 LAB — CBG MONITORING, ED: Glucose-Capillary: 180 mg/dL — ABNORMAL HIGH (ref 70–99)

## 2014-10-12 NOTE — ED Provider Notes (Signed)
CSN: [id number]     Arrival date & time 10/12/14  2328 History   This chart was scribed for Tomasita Crumble, MD by Evon Slack, ED Scribe. This patient was seen in room B16C/B16C and the patient's care was started at 11:48 PM.    Chief Complaint  Patient presents with  . Weakness  . Shortness of Breath   Patient is a 81 y.o. female presenting with weakness and shortness of breath. The history is provided by the patient and a relative. No language interpreter was used.  Weakness This is a new problem. The current episode started more than 2 days ago. The problem has been gradually worsening. Associated symptoms include shortness of breath. Pertinent negatives include no abdominal pain. Nothing relieves the symptoms. She has tried nothing for the symptoms.  Shortness of Breath Associated symptoms: cough   Associated symptoms: no abdominal pain and no fever    HPI Comments: [name] is a 81 y.o. female who presents to the Emergency Department complaining of worsening SOB onset 1 week ago but recently worsened today. Pt reports feeling heart palpitations. Pt reports weakness as well. Family reports non productive cough, rhinorrhea, sneezing, decreased appetite, and activity change. Family states that she has a cardiac issues with her aorta. Family states that her aortic artery is "dripping instead of flowing." Pt is a dialysis  pt and her last session was today. Pt states that she rarely produces urine. Denies fever, abdominal pain, diarrhea or blood in stool.    Past Medical History  Diagnosis Date  . Anemia 03/06/2011  . Hypertension   . Coronary artery disease   . Aortic stenosis, mild     severe aortic stenosis  . A-fib   . Acute CHF   . Myocardial infarction 04/2013  . Renal disorder     dialysis  . Dialysis patient   . Hyperlipidemia    Past Surgical History  Procedure Laterality Date  . Av fistula placement      L arm  . Other surgical history Bilateral     Surgery on  both arms.   . Fracture surgery  Bil. Arms  . Cardiac catheterization    . Pseudoaneurysm repair Right     leg  . Cataract extraction w/phaco Right 10/19/2013    Procedure: CATARACT EXTRACTION PHACO AND INTRAOCULAR LENS PLACEMENT (IOC);  Surgeon: Loraine Leriche T. Nile Riggs, MD;  Location: [facility];  Service: Ophthalmology;  Laterality: Right;  CDE:15.66  . Cataract extraction w/phaco Left 11/02/2013    Procedure: CATARACT EXTRACTION PHACO AND INTRAOCULAR LENS PLACEMENT (IOC);  Surgeon: Loraine Leriche T. Nile Riggs, MD;  Location: [facility];  Service: Ophthalmology;  Laterality: Left;  CDE:8.78  . Left and right heart catheterization with coronary angiogram N/A 05/11/2013    Procedure: LEFT AND RIGHT HEART CATHETERIZATION WITH CORONARY ANGIOGRAM;  Surgeon: Runell Gess, MD;  Location[facility] CATH LAB;  Service: Cardiovascular;  Laterality: N/A;  . Percutaneous coronary rotoblator intervention (pci-r) N/A 05/14/2013    Procedure: PERCUTANEOUS CORONARY ROTOBLATOR INTERVENTION (PCI-R);  Surgeon: Lennette Bihari, MD;  Location: [facility] LAB;  Service: Cardiovascular;  Laterality: N/A;  . Percutaneous coronary rotoblator intervention (pci-r) N/A 05/19/2013    Procedure: PERCUTANEOUS CORONARY ROTOBLATOR INTERVENTION (PCI-R);  Surgeon: Lennette Bihari, MD;  Location: [facility] C[facility] LAB;  Service: Cardiovascular;  Laterality: N/A;   No family history on file. History  Substance Use Topics  . Smoking status: Never Smoker   . Smokeless tobacco: Never Used  . Alcohol Use: No  OB History    No data available      Review of Systems  Constitutional: Positive for activity change and appetite change. Negative for fever.  HENT: Positive for rhinorrhea.   Respiratory: Positive for cough and shortness of breath.   Cardiovascular: Positive for palpitations.  Gastrointestinal: Negative for abdominal pain, diarrhea and blood in stool.  Neurological: Positive for weakness.  All other systems reviewed and are negative.   Allergies  Review of  patient's allergies indicates no known allergies.  Home Medications   Prior to Admission medications   Medication Sig Start Date End Date Taking? Authorizing Provider  amiodarone (PACERONE) 200 MG tablet TAKE ONE (1) TABLET BY MOUTH EVERY DAY 03/11/14   Chrystie Nose, MD  aspirin 81 MG EC tablet Take 81 mg by mouth daily. 05/22/13   Lonia Blood, MD  atorvastatin (LIPITOR) 40 MG tablet Take 40 mg by mouth daily.      Historical Provider, MD  clopidogrel (PLAVIX) 75 MG tablet Take 1 tablet (75 mg total) by mouth daily. <please make appointment for refills> 08/03/14   Runell Gess, MD  clopidogrel (PLAVIX) 75 MG tablet TAKE ONE (1) TABLET EACH DAY 09/01/14   Runell Gess, MD  darbepoetin (ARANESP) 100 MCG/0.5ML SOLN injection Inject 100 mcg into the skin See admin instructions. Takes every Monday, Wednesday and Friday with dialysis    Historical Provider, MD  doxercalciferol (HECTOROL) 4 MCG/2ML injection Inject 3.5 mcg into the vein every Monday, Wednesday, and Friday with hemodialysis.    Historical Provider, MD  metoprolol tartrate (LOPRESSOR) 25 MG tablet TAKE ONE TABLET BY MOUTH TWICE A DAY 03/11/14   Chrystie Nose, MD  sevelamer carbonate (RENVELA) 800 MG tablet Take 2,400 mg by mouth 3 (three) times daily with meals.    Historical Provider, MD  sodium bicarbonate 325 MG tablet Take 325 mg by mouth 2 (two) times daily.    Historical Provider, MD  warfarin (COUMADIN) 3 MG tablet Take 1.5-3 mg by mouth daily. Takes 3 mg on Sun, Tue, Thu, and Sat. Takes 1.5mg  on Mon, Wed, Fri. 06/08/13   Rosalee Kaufman, RPH-CPP  warfarin (COUMADIN) 3 MG tablet TAKE ONE TABLET (3MG ) ONCE DAILY 05/23/14   Rosalee Kaufman, RPH-CPP  warfarin (COUMADIN) 3 MG tablet TAKE ONE (1) TABLET EACH DAY 08/04/14   Dwana Melena, PA-C   Pulse 116  Temp(Src) 98.9 F (37.2 C) (Oral)  Ht 5\' 4"  (1.626 m)  Wt 112 lb (50.803 kg)  BMI 19.22 kg/m2  SpO2 99%   Physical Exam  Constitutional: She is oriented to  person, place, and time. She appears well-developed and well-nourished. No distress.  HENT:  Head: Normocephalic and atraumatic.  Nose: Nose normal.  Mouth/Throat: Oropharynx is clear and moist. No oropharyngeal exudate.  Eyes: Conjunctivae and EOM are normal. Pupils are equal, round, and reactive to light. No scleral icterus.  Neck: Normal range of motion. Neck supple. No JVD present. No tracheal deviation present. No thyromegaly present.  Cardiovascular: Normal rate.  An irregularly irregular rhythm present. Exam reveals no gallop and no friction rub.   Murmur heard. prominent diastolic murmur.    Pulmonary/Chest: Effort normal and breath sounds normal. No respiratory distress. She has no wheezes. She exhibits no tenderness.  Abdominal: Soft. Bowel sounds are normal. She exhibits no distension and no mass. There is no tenderness. There is no rebound and no guarding.  Musculoskeletal: Normal range of motion. She exhibits edema. She exhibits no tenderness.  Lymphadenopathy:    She has no cervical adenopathy.  Neurological: She is alert and oriented to person, place, and time. No cranial nerve deficit. She exhibits normal muscle tone.  Skin: Skin is warm and dry. No rash noted. No erythema. No pallor.  Nursing note and vitals reviewed.   ED Course  Procedures (including critical care time) DIAGNOSTIC STUDIES: Oxygen Saturation is 99% on RA, normal by my interpretation.    COORDINATION OF CARE: 12:14 AM-Discussed treatment plan with pt at bedside and pt agreed to plan.     Labs Review Labs Reviewed  CBC WITH DIFFERENTIAL/PLATELET - Abnormal; Notable for the following:    WBC 17.7 (*)    Hemoglobin 11.1 (*)    HCT 35.0 (*)    Neutrophils Relative % 78 (*)    Neutro Abs 13.9 (*)    Lymphocytes Relative 10 (*)    Monocytes Absolute 1.1 (*)    Eosinophils Absolute 0.8 (*)    All other components within normal limits  COMPREHENSIVE METABOLIC PANEL - Abnormal; Notable for the  following:    Chloride 93 (*)    Glucose, Bld 199 (*)    Creatinine, Ser 4.40 (*)    Albumin 2.6 (*)    GFR calc non Af Amer 8 (*)    GFR calc Af Amer 10 (*)    All other components within normal limits  PROTIME-INR - Abnormal; Notable for the following:    Prothrombin Time 38.1 (*)    INR 3.84 (*)    All other components within normal limits  I-STAT CG4 LACTIC ACID, ED - Abnormal; Notable for the following:    Lactic Acid, Venous 2.18 (*)    All other components within normal limits  CBG MONITORING, ED - Abnormal; Notable for the following:    Glucose-Capillary 180 (*)    All other components within normal limits  LIPASE, BLOOD  MAGNESIUM  ETHANOL  BRAIN NATRIURETIC PEPTIDE  I-STAT TROPOININ, ED    Imaging Review Dg Chest 2 View  10/13/2014   CLINICAL DATA:  Shortness of breath and chest discomfort  EXAM: CHEST  2 VIEW  COMPARISON:  05/07/2013  FINDINGS: Chronic cardiopericardial enlargement and aortic tortuosity. Extensive coronary atherosclerosis. There is no edema, consolidation, effusion, or pneumothorax. Remote upper thoracic compression fracture with height loss stable from 2014.  IMPRESSION: Cardiomegaly without failure.   Electronically Signed   By: Marnee SpringJonathon  Watts [name]   On: 10/13/2014 00:24     EKG Interpretation   Date/Time:  Wednesday October 12 2014 23:38:47 EDT Ventricular Rate:  111 PR Interval:    QRS Duration: 117 QT Interval:  385 QTC Calculation: 523 R Axis:   23 Text Interpretation:  Atrial fibrillation Left ventricular hypertrophy  Lateral infarct, age indeterminate Prolonged QT interval Confirmed by Erroll Lunani,  Ben Sanz Ayokunle [phone]([id number]) on 10/13/2014 12:17:19 AM      MDM   Final diagnoses:  SOB (shortness of breath)   Patient presents emergency department for weakness, chest pain, shortness of breath. This is in the setting of recent diagnosis of aortic stenosis with regurgitation. Patient has appointment on April 12 with Dr. Excell Seltzerooper to discuss management  of this. She states her symptoms are progressively gotten worse over the past week.  She denies any infectious history. She is end-stage renal disease and makes a small amount of urine intermittently. When she department workup is negative, patient will be retained in the hospital for continued management of her weakness.  I spoke with cardiology who states the  patient is also surgical candidate and there is nothing more they can do regarding her aorta. Patient does have elevated lactate and white blood cell count. She continues to be tachycardic in the emergency department. Will obtain CT scan of her chest to evaluate for pneumonia. Patient is admitted to [facility] hospitalist, Dr.K telemetry unit for continued management.   I personally performed the services described in this documentation, which was scribed in my presence. The recorded information has been reviewed and is accurate.    Tomasita Crumble, MD 10/13/14 [phone]

## 2014-10-12 NOTE — ED Notes (Signed)
MD at bedside. 

## 2014-10-12 NOTE — ED Notes (Signed)
Pt is from home, pt reports she has felt increased SOB since she left dialysis today, pt dialyzes M,W,F.  BP 118/60, HR 96 Afib (hx of afib).

## 2014-10-13 ENCOUNTER — Inpatient Hospital Stay (HOSPITAL_COMMUNITY): Payer: Medicare Other

## 2014-10-13 ENCOUNTER — Emergency Department (HOSPITAL_COMMUNITY): Payer: Medicare Other

## 2014-10-13 ENCOUNTER — Encounter (HOSPITAL_COMMUNITY): Payer: Self-pay

## 2014-10-13 DIAGNOSIS — J9601 Acute respiratory failure with hypoxia: Secondary | ICD-10-CM | POA: Diagnosis not present

## 2014-10-13 DIAGNOSIS — I4891 Unspecified atrial fibrillation: Secondary | ICD-10-CM | POA: Diagnosis present

## 2014-10-13 DIAGNOSIS — I252 Old myocardial infarction: Secondary | ICD-10-CM | POA: Diagnosis not present

## 2014-10-13 DIAGNOSIS — D631 Anemia in chronic kidney disease: Secondary | ICD-10-CM | POA: Diagnosis not present

## 2014-10-13 DIAGNOSIS — E8779 Other fluid overload: Secondary | ICD-10-CM | POA: Diagnosis not present

## 2014-10-13 DIAGNOSIS — I480 Paroxysmal atrial fibrillation: Secondary | ICD-10-CM | POA: Diagnosis present

## 2014-10-13 DIAGNOSIS — R079 Chest pain, unspecified: Secondary | ICD-10-CM | POA: Insufficient documentation

## 2014-10-13 DIAGNOSIS — E876 Hypokalemia: Secondary | ICD-10-CM | POA: Diagnosis present

## 2014-10-13 DIAGNOSIS — I350 Nonrheumatic aortic (valve) stenosis: Secondary | ICD-10-CM

## 2014-10-13 DIAGNOSIS — Z961 Presence of intraocular lens: Secondary | ICD-10-CM | POA: Diagnosis present

## 2014-10-13 DIAGNOSIS — I2510 Atherosclerotic heart disease of native coronary artery without angina pectoris: Secondary | ICD-10-CM | POA: Diagnosis not present

## 2014-10-13 DIAGNOSIS — Z9841 Cataract extraction status, right eye: Secondary | ICD-10-CM | POA: Diagnosis not present

## 2014-10-13 DIAGNOSIS — N186 End stage renal disease: Secondary | ICD-10-CM | POA: Diagnosis not present

## 2014-10-13 DIAGNOSIS — E785 Hyperlipidemia, unspecified: Secondary | ICD-10-CM | POA: Diagnosis present

## 2014-10-13 DIAGNOSIS — R0602 Shortness of breath: Secondary | ICD-10-CM | POA: Diagnosis not present

## 2014-10-13 DIAGNOSIS — Z992 Dependence on renal dialysis: Secondary | ICD-10-CM

## 2014-10-13 DIAGNOSIS — Z7901 Long term (current) use of anticoagulants: Secondary | ICD-10-CM | POA: Diagnosis not present

## 2014-10-13 DIAGNOSIS — I481 Persistent atrial fibrillation: Secondary | ICD-10-CM | POA: Diagnosis not present

## 2014-10-13 DIAGNOSIS — I120 Hypertensive chronic kidney disease with stage 5 chronic kidney disease or end stage renal disease: Secondary | ICD-10-CM | POA: Diagnosis present

## 2014-10-13 DIAGNOSIS — J811 Chronic pulmonary edema: Secondary | ICD-10-CM | POA: Diagnosis not present

## 2014-10-13 DIAGNOSIS — J189 Pneumonia, unspecified organism: Secondary | ICD-10-CM | POA: Diagnosis not present

## 2014-10-13 DIAGNOSIS — A419 Sepsis, unspecified organism: Secondary | ICD-10-CM | POA: Diagnosis present

## 2014-10-13 DIAGNOSIS — Z955 Presence of coronary angioplasty implant and graft: Secondary | ICD-10-CM | POA: Diagnosis not present

## 2014-10-13 DIAGNOSIS — D649 Anemia, unspecified: Secondary | ICD-10-CM | POA: Diagnosis present

## 2014-10-13 DIAGNOSIS — Z7982 Long term (current) use of aspirin: Secondary | ICD-10-CM | POA: Diagnosis not present

## 2014-10-13 DIAGNOSIS — I5032 Chronic diastolic (congestive) heart failure: Secondary | ICD-10-CM | POA: Diagnosis present

## 2014-10-13 DIAGNOSIS — I248 Other forms of acute ischemic heart disease: Secondary | ICD-10-CM | POA: Diagnosis present

## 2014-10-13 LAB — CBC WITH DIFFERENTIAL/PLATELET
BASOS ABS: 0.1 10*3/uL (ref 0.0–0.1)
BASOS PCT: 1 % (ref 0–1)
Basophils Absolute: 0.2 10*3/uL — ABNORMAL HIGH (ref 0.0–0.1)
Basophils Relative: 1 % (ref 0–1)
EOS ABS: 1.1 10*3/uL — AB (ref 0.0–0.7)
Eosinophils Absolute: 0.8 10*3/uL — ABNORMAL HIGH (ref 0.0–0.7)
Eosinophils Relative: 5 % (ref 0–5)
Eosinophils Relative: 6 % — ABNORMAL HIGH (ref 0–5)
HCT: 32 % — ABNORMAL LOW (ref 36.0–46.0)
HEMATOCRIT: 35 % — AB (ref 36.0–46.0)
HEMOGLOBIN: 10.1 g/dL — AB (ref 12.0–15.0)
Hemoglobin: 11.1 g/dL — ABNORMAL LOW (ref 12.0–15.0)
Lymphocytes Relative: 10 % — ABNORMAL LOW (ref 12–46)
Lymphocytes Relative: 15 % (ref 12–46)
Lymphs Abs: 1.8 10*3/uL (ref 0.7–4.0)
Lymphs Abs: 2.6 10*3/uL (ref 0.7–4.0)
MCH: 27.4 pg (ref 26.0–34.0)
MCH: 27.7 pg (ref 26.0–34.0)
MCHC: 31.6 g/dL (ref 30.0–36.0)
MCHC: 31.7 g/dL (ref 30.0–36.0)
MCV: 87 fL (ref 78.0–100.0)
MCV: 87.3 fL (ref 78.0–100.0)
MONO ABS: 1.1 10*3/uL — AB (ref 0.1–1.0)
MONOS PCT: 8 % (ref 3–12)
Monocytes Absolute: 1.4 10*3/uL — ABNORMAL HIGH (ref 0.1–1.0)
Monocytes Relative: 6 % (ref 3–12)
NEUTROS PCT: 70 % (ref 43–77)
NEUTROS PCT: 78 % — AB (ref 43–77)
Neutro Abs: 12.2 10*3/uL — ABNORMAL HIGH (ref 1.7–7.7)
Neutro Abs: 13.9 10*3/uL — ABNORMAL HIGH (ref 1.7–7.7)
Platelets: 363 10*3/uL (ref 150–400)
Platelets: 376 10*3/uL (ref 150–400)
RBC: 3.68 MIL/uL — ABNORMAL LOW (ref 3.87–5.11)
RBC: 4.01 MIL/uL (ref 3.87–5.11)
RDW: 15.3 % (ref 11.5–15.5)
RDW: 15.4 % (ref 11.5–15.5)
WBC: 17.5 10*3/uL — ABNORMAL HIGH (ref 4.0–10.5)
WBC: 17.7 10*3/uL — AB (ref 4.0–10.5)

## 2014-10-13 LAB — I-STAT CG4 LACTIC ACID, ED: Lactic Acid, Venous: 2.18 mmol/L (ref 0.5–2.0)

## 2014-10-13 LAB — COMPREHENSIVE METABOLIC PANEL
ALK PHOS: 73 U/L (ref 39–117)
ALT: 10 U/L (ref 0–35)
ALT: 9 U/L (ref 0–35)
ANION GAP: 10 (ref 5–15)
ANION GAP: 9 (ref 5–15)
AST: 15 U/L (ref 0–37)
AST: 33 U/L (ref 0–37)
Albumin: 2.3 g/dL — ABNORMAL LOW (ref 3.5–5.2)
Albumin: 2.6 g/dL — ABNORMAL LOW (ref 3.5–5.2)
Alkaline Phosphatase: 67 U/L (ref 39–117)
BUN: 16 mg/dL (ref 6–23)
BUN: 18 mg/dL (ref 6–23)
CALCIUM: 8.6 mg/dL (ref 8.4–10.5)
CO2: 32 mmol/L (ref 19–32)
CO2: 32 mmol/L (ref 19–32)
Calcium: 8.5 mg/dL (ref 8.4–10.5)
Chloride: 93 mmol/L — ABNORMAL LOW (ref 96–112)
Chloride: 96 mmol/L (ref 96–112)
Creatinine, Ser: 4.4 mg/dL — ABNORMAL HIGH (ref 0.50–1.10)
Creatinine, Ser: 4.66 mg/dL — ABNORMAL HIGH (ref 0.50–1.10)
GFR calc Af Amer: 10 mL/min — ABNORMAL LOW (ref >90)
GFR calc non Af Amer: 8 mL/min — ABNORMAL LOW (ref >90)
GFR, EST AFRICAN AMERICAN: 9 mL/min — AB (ref >90)
GFR, EST NON AFRICAN AMERICAN: 8 mL/min — AB (ref >90)
GLUCOSE: 86 mg/dL (ref 70–99)
Glucose, Bld: 199 mg/dL — ABNORMAL HIGH (ref 70–99)
POTASSIUM: 3 mmol/L — AB (ref 3.5–5.1)
POTASSIUM: 3.9 mmol/L (ref 3.5–5.1)
SODIUM: 137 mmol/L (ref 135–145)
Sodium: 135 mmol/L (ref 135–145)
TOTAL PROTEIN: 5.9 g/dL — AB (ref 6.0–8.3)
Total Bilirubin: 0.6 mg/dL (ref 0.3–1.2)
Total Bilirubin: 1.1 mg/dL (ref 0.3–1.2)
Total Protein: 6.5 g/dL (ref 6.0–8.3)

## 2014-10-13 LAB — BRAIN NATRIURETIC PEPTIDE

## 2014-10-13 LAB — PROCALCITONIN: Procalcitonin: 2.54 ng/mL

## 2014-10-13 LAB — TROPONIN I
TROPONIN I: 0.06 ng/mL — AB (ref <0.031)
TROPONIN I: 0.06 ng/mL — AB (ref <0.031)
Troponin I: 0.08 ng/mL — ABNORMAL HIGH (ref <0.031)

## 2014-10-13 LAB — I-STAT TROPONIN, ED: Troponin i, poc: 0.05 ng/mL (ref 0.00–0.08)

## 2014-10-13 LAB — LACTIC ACID, PLASMA: Lactic Acid, Venous: 1 mmol/L (ref 0.5–2.0)

## 2014-10-13 LAB — PROTIME-INR
INR: 3.69 — AB (ref 0.00–1.49)
INR: 3.84 — ABNORMAL HIGH (ref 0.00–1.49)
PROTHROMBIN TIME: 36.9 s — AB (ref 11.6–15.2)
PROTHROMBIN TIME: 38.1 s — AB (ref 11.6–15.2)

## 2014-10-13 LAB — MAGNESIUM: Magnesium: 1.9 mg/dL (ref 1.5–2.5)

## 2014-10-13 LAB — PHOSPHORUS: PHOSPHORUS: 2.6 mg/dL (ref 2.3–4.6)

## 2014-10-13 LAB — LIPASE, BLOOD: Lipase: 27 U/L (ref 11–59)

## 2014-10-13 MED ORDER — SODIUM CHLORIDE 0.9 % IV SOLN
500.0000 mg | INTRAVENOUS | Status: DC
  Administered 2014-10-14: 500 mg via INTRAVENOUS
  Filled 2014-10-13 (×2): qty 500

## 2014-10-13 MED ORDER — DARBEPOETIN ALFA 40 MCG/0.4ML IJ SOSY
40.0000 ug | PREFILLED_SYRINGE | INTRAMUSCULAR | Status: DC
  Administered 2014-10-14: 40 ug via INTRAVENOUS
  Filled 2014-10-13: qty 0.4

## 2014-10-13 MED ORDER — WARFARIN - PHARMACIST DOSING INPATIENT
Freq: Every day | Status: DC
  Administered 2014-10-14: 17:00:00

## 2014-10-13 MED ORDER — METOPROLOL TARTRATE 25 MG PO TABS
25.0000 mg | ORAL_TABLET | Freq: Two times a day (BID) | ORAL | Status: DC
  Administered 2014-10-13 – 2014-10-14 (×4): 25 mg via ORAL
  Filled 2014-10-13 (×6): qty 1

## 2014-10-13 MED ORDER — ENOXAPARIN SODIUM 30 MG/0.3ML ~~LOC~~ SOLN: 30.0000 mg | SUBCUTANEOUS | Status: DC

## 2014-10-13 MED ORDER — ONDANSETRON HCL 4 MG PO TABS: 4.0000 mg | ORAL_TABLET | Freq: Four times a day (QID) | ORAL | Status: DC | PRN

## 2014-10-13 MED ORDER — ACETAMINOPHEN 325 MG PO TABS: 650.0000 mg | ORAL_TABLET | Freq: Four times a day (QID) | ORAL | Status: DC | PRN

## 2014-10-13 MED ORDER — POTASSIUM CHLORIDE CRYS ER 20 MEQ PO TBCR
40.0000 meq | EXTENDED_RELEASE_TABLET | Freq: Once | ORAL | Status: AC
  Administered 2014-10-13: 40 meq via ORAL
  Filled 2014-10-13: qty 2

## 2014-10-13 MED ORDER — SEVELAMER CARBONATE 800 MG PO TABS
1600.0000 mg | ORAL_TABLET | Freq: Two times a day (BID) | ORAL | Status: DC
  Filled 2014-10-13 (×3): qty 2

## 2014-10-13 MED ORDER — VANCOMYCIN HCL 10 G IV SOLR
1250.0000 mg | Freq: Once | INTRAVENOUS | Status: AC
  Administered 2014-10-13: 1250 mg via INTRAVENOUS
  Filled 2014-10-13: qty 1250

## 2014-10-13 MED ORDER — CLOPIDOGREL BISULFATE 75 MG PO TABS
75.0000 mg | ORAL_TABLET | Freq: Every day | ORAL | Status: DC
  Administered 2014-10-13 – 2014-10-15 (×3): 75 mg via ORAL
  Filled 2014-10-13 (×3): qty 1

## 2014-10-13 MED ORDER — AMIODARONE HCL 200 MG PO TABS
200.0000 mg | ORAL_TABLET | Freq: Every day | ORAL | Status: DC
  Administered 2014-10-13: 200 mg via ORAL
  Filled 2014-10-13 (×2): qty 1

## 2014-10-13 MED ORDER — SODIUM BICARBONATE 650 MG PO TABS
325.0000 mg | ORAL_TABLET | Freq: Two times a day (BID) | ORAL | Status: DC
  Filled 2014-10-13 (×2): qty 0.5

## 2014-10-13 MED ORDER — FOLIC ACID 1 MG PO TABS
1.0000 mg | ORAL_TABLET | Freq: Every day | ORAL | Status: DC
  Administered 2014-10-13 – 2014-10-15 (×3): 1 mg via ORAL
  Filled 2014-10-13 (×3): qty 1

## 2014-10-13 MED ORDER — DOXERCALCIFEROL 4 MCG/2ML IV SOLN
2.5000 ug | INTRAVENOUS | Status: DC
  Administered 2014-10-14: 2.5 ug via INTRAVENOUS
  Filled 2014-10-13: qty 2

## 2014-10-13 MED ORDER — ONDANSETRON HCL 4 MG/2ML IJ SOLN: 4.0000 mg | Freq: Four times a day (QID) | INTRAMUSCULAR | Status: DC | PRN

## 2014-10-13 MED ORDER — ACETAMINOPHEN 650 MG RE SUPP: 650.0000 mg | Freq: Four times a day (QID) | RECTAL | Status: DC | PRN

## 2014-10-13 MED ORDER — ATORVASTATIN CALCIUM 40 MG PO TABS
40.0000 mg | ORAL_TABLET | Freq: Every day | ORAL | Status: DC
  Administered 2014-10-13 – 2014-10-15 (×3): 40 mg via ORAL
  Filled 2014-10-13 (×3): qty 1

## 2014-10-13 MED ORDER — SEVELAMER CARBONATE 800 MG PO TABS
1600.0000 mg | ORAL_TABLET | Freq: Three times a day (TID) | ORAL | Status: DC
  Administered 2014-10-13 (×2): 800 mg via ORAL
  Administered 2014-10-14 – 2014-10-15 (×3): 1600 mg via ORAL
  Filled 2014-10-13 (×9): qty 2

## 2014-10-13 MED ORDER — RENA-VITE PO TABS
1.0000 | ORAL_TABLET | Freq: Every day | ORAL | Status: DC
  Administered 2014-10-13 – 2014-10-15 (×3): 1 via ORAL
  Filled 2014-10-13 (×3): qty 1

## 2014-10-13 MED ORDER — DEXTROSE 5 % IV SOLN
2.0000 g | INTRAVENOUS | Status: DC
  Administered 2014-10-14: 2 g via INTRAVENOUS
  Filled 2014-10-13 (×2): qty 2

## 2014-10-13 MED ORDER — ASPIRIN EC 81 MG PO TBEC
81.0000 mg | DELAYED_RELEASE_TABLET | Freq: Every day | ORAL | Status: DC
  Administered 2014-10-13 – 2014-10-15 (×3): 81 mg via ORAL
  Filled 2014-10-13 (×3): qty 1

## 2014-10-13 MED ORDER — SODIUM CHLORIDE 0.9 % IJ SOLN
3.0000 mL | Freq: Two times a day (BID) | INTRAMUSCULAR | Status: DC
  Administered 2014-10-13 – 2014-10-14 (×3): 3 mL via INTRAVENOUS

## 2014-10-13 MED ORDER — DOXERCALCIFEROL 4 MCG/2ML IV SOLN: 3.5000 ug | INTRAVENOUS | Status: DC

## 2014-10-13 MED ORDER — DARBEPOETIN ALFA-POLYSORBATE 100 MCG/0.5ML IJ SOLN: 100.0000 ug | INTRAMUSCULAR | Status: DC

## 2014-10-13 MED ORDER — DEXTROSE 5 % IV SOLN
2.0000 g | Freq: Once | INTRAVENOUS | Status: AC
  Administered 2014-10-13: 2 g via INTRAVENOUS
  Filled 2014-10-13: qty 2

## 2014-10-13 MED ORDER — BISACODYL 10 MG RE SUPP
10.0000 mg | Freq: Once | RECTAL | Status: AC
  Administered 2014-10-13: 10 mg via RECTAL
  Filled 2014-10-13: qty 1

## 2014-10-13 NOTE — Progress Notes (Signed)
ANTICOAGULATION CONSULT NOTE - Initial Consult  Pharmacy Consult for Coumadin Indication: atrial fibrillation  No Known Allergies  Patient Measurements: Height: 5\' 4"  (162.6 cm) Weight: 116 lb 6.5 oz (52.8 kg) IBW/kg (Calculated) : 54.7  Vital Signs: Temp: 98.4 F (36.9 C) (04/07 0332) Temp Source: Oral (04/07 0332) BP: 98/53 mmHg (04/07 0332) Pulse Rate: 110 (04/07 0332)  Labs:  Recent Labs  10/12/14 2345  HGB 11.1*  HCT 35.0*  PLT 376  LABPROT 38.1*  INR 3.84*  CREATININE 4.40*    Estimated Creatinine Clearance: 7.9 mL/min (by C-G formula based on Cr of 4.4).   Medical History: Past Medical History  Diagnosis Date  . Anemia 03/06/2011  . Hypertension   . Coronary artery disease   . Aortic stenosis, mild     severe aortic stenosis  . A-fib   . Acute CHF   . Myocardial infarction 04/2013  . Renal disorder     dialysis  . Dialysis patient   . Hyperlipidemia     Medications:  Prescriptions prior to admission  Medication Sig Dispense Refill Last Dose  . amiodarone (PACERONE) 200 MG tablet TAKE ONE (1) TABLET BY MOUTH EVERY DAY 30 tablet 5 10/11/2014 at Unknown time  . aspirin 81 MG EC tablet Take 81 mg by mouth daily.   10/11/2014 at Unknown time  . atorvastatin (LIPITOR) 40 MG tablet Take 40 mg by mouth daily.     10/12/2014 at Unknown time  . clopidogrel (PLAVIX) 75 MG tablet Take 1 tablet (75 mg total) by mouth daily. <please make appointment for refills> (Patient not taking: Reported on 10/13/2014) 30 tablet 0 Not Taking at Unknown time  . clopidogrel (PLAVIX) 75 MG tablet TAKE ONE (1) TABLET EACH DAY 30 tablet 11 10/12/2014 at Unknown time  . darbepoetin (ARANESP) 100 MCG/0.5ML SOLN injection Inject 100 mcg into the skin See admin instructions. Takes every Monday, Wednesday and Friday with dialysis   10/12/2014 at Unknown time  . doxercalciferol (HECTOROL) 4 MCG/2ML injection Inject 3.5 mcg into the vein every Monday, Wednesday, and Friday with hemodialysis.    10/12/2014 at Unknown time  . folic acid (FOLVITE) 1 MG tablet Take 1 mg by mouth daily.   10/11/2014 at Unknown time  . metoprolol tartrate (LOPRESSOR) 25 MG tablet TAKE ONE TABLET BY MOUTH TWICE A DAY 60 tablet 5 10/12/2014 at 930 pm  . multivitamin (RENA-VIT) TABS tablet Take 1 tablet by mouth daily.   10/12/2014 at Unknown time  . sevelamer carbonate (RENVELA) 800 MG tablet Take 1,600 mg by mouth 2 (two) times daily with a meal.    10/12/2014 at Unknown time  . sodium bicarbonate 325 MG tablet Take 325 mg by mouth 2 (two) times daily.   10/12/2014 at Unknown time  . warfarin (COUMADIN) 3 MG tablet Take 1.5-3 mg by mouth daily. Takes 3 mg on Sun, Tue, Wed, Thu, and Sat. Takes 1.5mg  on Mon & Fri.   10/12/2014 at Unknown time  . warfarin (COUMADIN) 3 MG tablet TAKE ONE TABLET (3MG ) ONCE DAILY (Patient not taking: Reported on 10/13/2014) 30 tablet 1 Not Taking at Unknown time  . warfarin (COUMADIN) 3 MG tablet TAKE ONE (1) TABLET EACH DAY (Patient not taking: Reported on 10/13/2014) 30 tablet 3 Not Taking at Unknown time    Assessment: 81 y.o. female admitted with SOB/weakness, h.o Afib, to continue Coumadin  Goal of Therapy:  INR 2-3 Monitor platelets by anticoagulation protocol: Yes   Plan:  No Coumadin today F/U daily INR  Eddie Candle 10/13/2014,4:01 AM

## 2014-10-13 NOTE — Progress Notes (Signed)
Pt converted to sinus rhythm 77. Pt resting with call bell within reach.  Will continue to monitor. Benbrook HoffBurton, Quyen Cutsforth McClintock, RN

## 2014-10-13 NOTE — Consult Note (Signed)
[name] is an 81 y.o. female referred by Dr Gonzella Lex   Chief Complaint: ESRD, Anemia, Sec HPTH HPI: 81 yo BF with ESRD on HD MWF at [facility] admitted yest for SOB.  Last HD was yest.  She says she has issues with hypotension on HD.  She also thinks she has lost some weight recently.  Most of her SOB is with exertion and currently she is resting in bed comfortably without needing O2.  Past Medical History  Diagnosis Date  . Anemia 03/06/2011  . Hypertension   . Coronary artery disease   . Aortic stenosis, mild     severe aortic stenosis  . A-fib   . Acute CHF   . Myocardial infarction 04/2013  . Renal disorder     dialysis  . Dialysis patient   . Hyperlipidemia     Past Surgical History  Procedure Laterality Date  . Av fistula placement      L arm  . Other surgical history Bilateral     Surgery on both arms.   . Fracture surgery  Bil. Arms  . Cardiac catheterization    . Pseudoaneurysm repair Right     leg  . Cataract extraction w/phaco Right 10/19/2013    Procedure: CATARACT EXTRACTION PHACO AND INTRAOCULAR LENS PLACEMENT (IOC);  Surgeon: Loraine Leriche T. Nile Riggs, MD;  Location: [facility];  Service: Ophthalmology;  Laterality: Right;  CDE:15.66  . Cataract extraction w/phaco Left 11/02/2013    Procedure: CATARACT EXTRACTION PHACO AND INTRAOCULAR LENS PLACEMENT (IOC);  Surgeon: Loraine Leriche T. Nile Riggs, MD;  Location: [facility];  Service: Ophthalmology;  Laterality: Left;  CDE:8.78  . Left and right heart catheterization with coronary angiogram N/A 05/11/2013    Procedure: LEFT AND RIGHT HEART CATHETERIZATION WITH CORONARY ANGIOGRAM;  Surgeon: Runell Gess, MD;  Location: [facility] CATH LAB;  Service: Cardiovascular;  Laterality: N/A;  . Percutaneous coronary rotoblator intervention (pci-r) N/A 05/14/2013    Procedure: PERCUTANEOUS CORONARY ROTOBLATOR INTERVENTION (PCI-R);  Surgeon: Lennette Bihari, MD;  Location: [facility] CATH LAB;  Service: Cardiovascular;  Laterality: N/A;  . Percutaneous coronary  rotoblator intervention (pci-r) N/A 05/19/2013    Procedure: PERCUTANEOUS CORONARY ROTOBLATOR INTERVENTION (PCI-R);  Surgeon: Lennette Bihari, MD;  Location: [facility] CATH LAB;  Service: Cardiovascular;  Laterality: N/A;    History reviewed. No pertinent family history. Social History:  reports that she has never smoked. She has never used smokeless tobacco. She reports that she does not drink alcohol or use illicit drugs.  Allergies: No Known Allergies  Medications Prior to Admission  Medication Sig Dispense Refill  . amiodarone (PACERONE) 200 MG tablet TAKE ONE (1) TABLET BY MOUTH EVERY DAY 30 tablet 5  . aspirin 81 MG EC tablet Take 81 mg by mouth daily.    Marland Kitchen atorvastatin (LIPITOR) 40 MG tablet Take 40 mg by mouth daily.      . clopidogrel (PLAVIX) 75 MG tablet Take 1 tablet (75 mg total) by mouth daily. <please make appointment for refills> (Patient not taking: Reported on 10/13/2014) 30 tablet 0  . clopidogrel (PLAVIX) 75 MG tablet TAKE ONE (1) TABLET EACH DAY 30 tablet 11  . darbepoetin (ARANESP) 100 MCG/0.5ML SOLN injection Inject 100 mcg into the skin See admin instructions. Takes every Monday, Wednesday and Friday with dialysis    . doxercalciferol (HECTOROL) 4 MCG/2ML injection Inject 3.5 mcg into the vein every Monday, Wednesday, and Friday with hemodialysis.    . folic acid (FOLVITE) 1 MG tablet Take 1 mg by mouth  daily.    . metoprolol tartrate (LOPRESSOR) 25 MG tablet TAKE ONE TABLET BY MOUTH TWICE A DAY 60 tablet 5  . multivitamin (RENA-VIT) TABS tablet Take 1 tablet by mouth daily.    . sevelamer carbonate (RENVELA) 800 MG tablet Take 1,600 mg by mouth 2 (two) times daily with a meal.     . sodium bicarbonate 325 MG tablet Take 325 mg by mouth 2 (two) times daily.    Marland Kitchen warfarin (COUMADIN) 3 MG tablet Take 1.5-3 mg by mouth daily. Takes 3 mg on Sun, Tue, Wed, Thu, and Sat. Takes 1.5mg  on Mon & Fri.    . warfarin (COUMADIN) 3 MG tablet TAKE ONE TABLET ( ) ONCE DAILY (Patient not  taking: Reported on 10/13/2014) 30 tablet 1  . warfarin (COUMADIN) 3 MG tablet TAKE ONE (1) TABLET EACH DAY (Patient not taking: Reported on 10/13/2014) 30 tablet 3     Lab Results: UA: ND   Recent Labs  10/12/14 2345 10/13/14 0512  WBC 17.7* 17.5*  HGB 11.1* 10.1*  HCT 35.0* 32.0*  PLT 376 363   BMET  Recent Labs  10/12/14 2345 10/13/14 0512  NA 135 137  K 3.9 3.0*  CL 93* 96  CO2 32 32  GLUCOSE 199* 86  BUN 16 18  CREATININE 4.40* 4.66*  CALCIUM 8.5 8.6   LFT  Recent Labs  10/13/14 0512  PROT 5.9*  ALBUMIN 2.3*  AST 15  ALT 9  ALKPHOS 67  BILITOT 0.6   Dg Chest 2 View  10/13/2014   CLINICAL DATA:  Shortness of breath and chest discomfort  EXAM: CHEST  2 VIEW  COMPARISON:  05/07/2013  FINDINGS: Chronic cardiopericardial enlargement and aortic tortuosity. Extensive coronary atherosclerosis. There is no edema, consolidation, effusion, or pneumothorax. Remote upper thoracic compression fracture with height loss stable from 2014.  IMPRESSION: Cardiomegaly without failure.   Electronically Signed   By: Marnee Spring [name]   On: 10/13/2014 00:24   Ct Chest Wo Contrast  10/13/2014   CLINICAL DATA:  Weakness and shortness of breath  EXAM: CT CHEST WITHOUT CONTRAST  TECHNIQUE: Multidetector CT imaging of the chest was performed following the standard protocol without IV contrast.  COMPARISON:  None.  FINDINGS: THORACIC INLET/BODY WALL:  No acute abnormality.  MEDIASTINUM:  There is chronic cardiomegaly with multi chamber enlargement. Bulky aortic valvular calcifications/ sclerosis. There is extensive aortic and mitral annular calcification as well. Diffuse atherosclerotic calcification, throughout both the left and right coronary circulation. No acute vascular findings. A low-density 14 mm ovoid nodule just above the aortic hiatus is the cisterna chyli.  LUNG WINDOWS:  There is no pneumonia. Minimal interlobular septal and fissural thickening. No pleural effusion or alveolar edema.   UPPER ABDOMEN:  Severe renal atrophy. There is a partially visualized presumed 4 cm cyst in the upper pole left kidney.  OSSEOUS:  Remote T4 and T6 compression fractures. Height loss is greater at T6, near 75%.  IMPRESSION: 1. Minimal interstitial pulmonary edema. 2. Extensive aortic valvular calcifications/sclerosis.   Electronically Signed   By: Marnee Spring [name]   On: 10/13/2014 01:39    ROS: No Change in vision No CP + cough.  Denies fever No abd pain, + constipation No new arthritic CO's + numbness/tingling of feet No dysuria  PHYSICAL EXAM: Blood pressure 98/53, pulse 110, temperature 98.4 F (36.9 C), temperature source Oral, resp. rate 18, height  (1.626 m), weight 52.8 kg (116 lb 6.5 oz), SpO2 95 %. HEENT: PERRLA  EOMI NECK:No JVD LUNGS:Basilar crackles CARDIAC:Irreg,irreg 3/6 systolic M at LSB ABD:+ BS NTND No HSM EXT:No edema  LUA AVF + bruit NEURO:CNI Ox3 no asterixis   Davita Delight:  MWF, 3hr, 2K, BFR 400, DW 53.5.  EPO 5000u, hectorol 2.395mcg  Assessment: 1. ESRD on HD MWF 2. Aortic stenosis 3. A fib 4. Anemia 5. Sec HPTH PLAN: 1. Plan HD tomorrow and will extend time to 4hr to lessen chance for hypotension.  Suspect she needs lower DW 2. DC bicarb as level is 32 3. Resume aranesp 4. Resume hectorol 5. Check PO4   Keishawn Darsey T 10/13/2014, 10:14 AM

## 2014-10-13 NOTE — ED Notes (Signed)
Pt reports she does not produce urine 

## 2014-10-13 NOTE — H&P (Addendum)
[facility] History and Physical  [name]:[id number] DOB: 02/06/1928 DOA: 10/12/2014  Referring physician: ER physician. PCP: [name], MD   Chief Complaint: Shortness of breath.  HPI: [name] is a 81 y.o. female with history of ESRD on hemodialysis on Monday Wednesday and Friday, recent echocardiogram done in every 2016 showing severe aortic stenosis, CAD status post stenting, paroxysmal atrial fibrillation on Coumadin and amiodarone and metoprolol, chronic anemia, diastolic CHF presents to the ER because of worsening shortness of breath. Patient states she has been short of breath off and on last 2 weeks but last evening patient started experiencing acute worsening of shortness of breath. Patient also has been having some nonproductive cough for last 2-3 days. Denies any fever chills chest pain palpitations. In the ER patient was found to be in A. fib with RVR and CT chest done without contrast shows pulmonary edema. On exam patient is not in acute distress but has mild shortness of breath. Patient was originally scheduled to follow-up with cardiologist Dr. Excell Seltzer with regarding to patient's aortic stenosis.   Review of Systems: As presented in the history of presenting illness, rest negative.  Past Medical History  Diagnosis Date  . Anemia 03/06/2011  . Hypertension   . Coronary artery disease   . Aortic stenosis, mild     severe aortic stenosis  . A-fib   . Acute CHF   . Myocardial infarction 04/2013  . Renal disorder     dialysis  . Dialysis patient   . Hyperlipidemia    Past Surgical History  Procedure Laterality Date  . Av fistula placement      L arm  . Other surgical history Bilateral     Surgery on both arms.   . Fracture surgery  Bil. Arms  . Cardiac catheterization    . Pseudoaneurysm repair Right     leg  . Cataract extraction w/phaco Right 10/19/2013    Procedure: CATARACT EXTRACTION PHACO AND INTRAOCULAR LENS PLACEMENT (IOC);   Surgeon: Loraine Leriche T. Nile Riggs, MD;  Location: [facility];  Service: Ophthalmology;  Laterality: Right;  CDE:15.66  . Cataract extraction w/phaco Left 11/02/2013    Procedure: CATARACT EXTRACTION PHACO AND INTRAOCULAR LENS PLACEMENT (IOC);  Surgeon: Loraine Leriche T. Nile Riggs, MD;  Location: [facility];  Service: Ophthalmology;  Laterality: Left;  CDE:8.78  . Left and right heart catheterization with coronary angiogram N/A 05/11/2013    Procedure: LEFT AND RIGHT HEART CATHETERIZATION WITH CORONARY ANGIOGRAM;  Surgeon: Runell Gess, MD;  Location[facility] CATH LAB;  Service: Cardiovascular;  Laterality: N/A;  . Percutaneous coronary rotoblator intervention (pci-r) N/A 05/14/2013    Procedure: PERCUTANEOUS CORONARY ROTOBLATOR INTERVENTION (PCI-R);  Surgeon: Lennette Bihari, MD;  Location: [facility] CATH LAB;  Service: Cardiovascular;  Laterality: N/A;  . Percutaneous coronary rotoblator intervention (pci-r) N/A 05/19/2013    Procedure: PERCUTANEOUS CORONARY ROTOBLATOR INTERVENTION (PCI-R);  Surgeon: Lennette Bihari, MD;  Location: [facility] CATH LAB;  Service: Cardiovascular;  Laterality: N/A;   Social History:  reports that she has never smoked. She has never used smokeless tobacco. She reports that she does not drink alcohol or use illicit drugs. Where does patient live home. Can patient participate in ADLs? Yes.  No Known Allergies  Family History: History reviewed. No pertinent family history.    Prior to Admission medications   Medication Sig Start Date End Date Taking? Authorizing Provider  amiodarone (PACERONE) 200 MG tablet TAKE ONE (1) TABLET BY MOUTH EVERY DAY 03/11/14  Yes Lisette Abu  Hilty, MD  aspirin 81 MG EC tablet Take 81 mg by mouth daily. 05/22/13  Yes Lonia Blood, MD  atorvastatin (LIPITOR) 40 MG tablet Take 40 mg by mouth daily.     Yes Historical Provider, MD  clopidogrel (PLAVIX) 75 MG tablet Take 1 tablet (75 mg total) by mouth daily. <please make appointment for refills> Patient not taking: Reported on 10/13/2014  08/03/14   Runell Gess, MD  clopidogrel (PLAVIX) 75 MG tablet TAKE ONE (1) TABLET EACH DAY 09/01/14  Yes Runell Gess, MD  darbepoetin (ARANESP) 100 MCG/0.5ML SOLN injection Inject 100 mcg into the skin See admin instructions. Takes every Monday, Wednesday and Friday with dialysis   Yes Historical Provider, MD  doxercalciferol (HECTOROL) 4 MCG/2ML injection Inject 3.5 mcg into the vein every Monday, Wednesday, and Friday with hemodialysis.   Yes Historical Provider, MD  folic acid (FOLVITE) 1 MG tablet Take 1 mg by mouth daily.   Yes Historical Provider, MD  metoprolol tartrate (LOPRESSOR) 25 MG tablet TAKE ONE TABLET BY MOUTH TWICE A DAY 03/11/14  Yes Chrystie Nose, MD  multivitamin (RENA-VIT) TABS tablet Take 1 tablet by mouth daily.   Yes Historical Provider, MD  sevelamer carbonate (RENVELA) 800 MG tablet Take 1,600 mg by mouth 2 (two) times daily with a meal.    Yes Historical Provider, MD  sodium bicarbonate 325 MG tablet Take 325 mg by mouth 2 (two) times daily.   Yes Historical Provider, MD  warfarin (COUMADIN) 3 MG tablet Take 1.5-3 mg by mouth daily. Takes 3 mg on Sun, Tue, Wed, Thu, and Sat. Takes 1.5mg  on Mon & Fri. 06/08/13  Yes Rosalee Kaufman, RPH-CPP  warfarin (COUMADIN) 3 MG tablet TAKE ONE TABLET ( ) ONCE DAILY Patient not taking: Reported on 10/13/2014 05/23/14   Rosalee Kaufman, RPH-CPP  warfarin (COUMADIN) 3 MG tablet TAKE ONE (1) TABLET EACH DAY Patient not taking: Reported on 10/13/2014 08/04/14   Dwana Melena, PA-C    Physical Exam: Filed Vitals:   10/13/14 0132 10/13/14 0209 10/13/14 0212 10/13/14 0332  BP:   115/56 98/53  Pulse: 98  97 110  Temp:   98.4 F (36.9 C) 98.4 F (36.9 C)  TempSrc:   Oral Oral  Resp: Height:   (1.626 m)    Weight:  52.8 kg (116 lb 6.5 oz)    SpO2: 98%  97% 95%     General:  Moderately built and nourished.  Eyes: Anicteric no pallor.  ENT: No discharge from the ears eyes nose and mouth.  Neck: No mass  felt.  Cardiovascular: S1 and S2 heard.  Respiratory: No rhonchi or crepitations.  Abdomen: Soft nontender bowel sounds present.  Skin: No rash.  Musculoskeletal: No edema.  Psychiatric: Appears normal.  Neurologic: Alert awake oriented to time place and person. Moves all extremities.  Labs on Admission:  Basic Metabolic Panel:  Recent Labs Lab 10/12/14 2345  NA 135  K 3.9  CL 93*  CO2 32  GLUCOSE 199*  BUN 16  CREATININE 4.40*  CALCIUM 8.5  MG 1.9   Liver Function Tests:  Recent Labs Lab 10/12/14 2345  AST 33  ALT 10  ALKPHOS 73  BILITOT 1.1  PROT 6.5  ALBUMIN 2.6*    Recent Labs Lab 10/12/14 2345  LIPASE 27   No results for input(s): AMMONIA in the last 168 hours. CBC:  Recent Labs Lab 10/12/14 2345  WBC 17.7*  NEUTROABS 13.9*  HGB 11.1*  HCT 35.0*  MCV 87.3  PLT 376   Cardiac Enzymes: No results for input(s): CKTOTAL, CKMB, CKMBINDEX, TROPONINI in the last 168 hours.  BNP (last 3 results)  Recent Labs  10/12/14 2345  BNP >4500.0*    ProBNP (last 3 results) No results for input(s): PROBNP in the last 8760 hours.  CBG:  Recent Labs Lab 10/12/14 2350  GLUCAP 180*    Radiological Exams on Admission: Dg Chest 2 View  10/13/2014   CLINICAL DATA:  Shortness of breath and chest discomfort  EXAM: CHEST  2 VIEW  COMPARISON:  05/07/2013  FINDINGS: Chronic cardiopericardial enlargement and aortic tortuosity. Extensive coronary atherosclerosis. There is no edema, consolidation, effusion, or pneumothorax. Remote upper thoracic compression fracture with height loss stable from 2014.  IMPRESSION: Cardiomegaly without failure.   Electronically Signed   By: Marnee SpringJonathon  Watts [name]   On: 10/13/2014 00:24   Ct Chest Wo Contrast  10/13/2014   CLINICAL DATA:  Weakness and shortness of breath  EXAM: CT CHEST WITHOUT CONTRAST  TECHNIQUE: Multidetector CT imaging of the chest was performed following the standard protocol without IV contrast.  COMPARISON:   None.  FINDINGS: THORACIC INLET/BODY WALL:  No acute abnormality.  MEDIASTINUM:  There is chronic cardiomegaly with multi chamber enlargement. Bulky aortic valvular calcifications/ sclerosis. There is extensive aortic and mitral annular calcification as well. Diffuse atherosclerotic calcification, throughout both the left and right coronary circulation. No acute vascular findings. A low-density 14 mm ovoid nodule just above the aortic hiatus is the cisterna chyli.  LUNG WINDOWS:  There is no pneumonia. Minimal interlobular septal and fissural thickening. No pleural effusion or alveolar edema.  UPPER ABDOMEN:  Severe renal atrophy. There is a partially visualized presumed 4 cm cyst in the upper pole left kidney.  OSSEOUS:  Remote T4 and T6 compression fractures. Height loss is greater at T6, near 75%.  IMPRESSION: 1. Minimal interstitial pulmonary edema. 2. Extensive aortic valvular calcifications/sclerosis.   Electronically Signed   By: Marnee SpringJonathon  Watts [name]   On: 10/13/2014 01:39    EKG: Independently reviewed. Atrial fibrillation with RVR with nonspecific ST-T changes.  Assessment/Plan Principal Problem:   Acute respiratory failure with hypoxia Active Problems:   ESRD (end stage renal disease) on dialysis   CAD- RCA HSRA/DES 05/15/13-  CFX/OM1 DES 05/19/13   Dyslipidemia   Atrial fibrillation with rapid ventricular response   Aortic stenosis   1. Acute respiratory failure with hypoxia - in a patient with known history of ESRD and severe aortic stenosis. Probably secondary to fluid overload and decompensated CHF in the setting of A. fib with RVR. Patient also has leukocytosis with cough but afebrile. Not sure if patient is developing pneumonia. We'll need to consult nephrology for dialysis was required to fluid management. Patient's last EF measured in February 2016 was showing 60-65% with grade 3 diastolic dysfunction. Patient has follow-up with cardiologist Dr. Excell Seltzerooper was going to severe aortic  stenosis. On-call cardiologist was consulted by ER physician. 2. A. fib with RVR - patient has not taken her metoprolol yesterday. Have ordered 1 dose now and continue her regular dose. If the artery does not get controlled and may need IV Cardizem or metoprolol. Patient on amiodarone. Coumadin per pharmacy. Chads2 vasc score is more than 2. 3. Leukocytosis with elevated lactic acid - not sure if patient is having pneumonia or developing sepsis. At this time I ordered blood cultures. Place patient on vancomycin and cefepime. Check pro-calcitonin levels. Recheck lactic acid  levels. 4. CAD status post stenting - denies any chest pain. Continue antiplatelet agents. Check troponin. 5. Chronic anemia - follow CBC. 6. Hyperlipidemia on statins. 7. Diastolic CHF last EF measured in February 2016 was 60-65% with grade 3 diastolic dysfunction - see #1. 8. Severe aortic stenosis - see #1. 9. Hyperglycemia - check hemoglobin A1c.   DVT ProphylaxisLovenox.  Code Status: Full code.  Family Communication: Patient's daughter at the bedside.  Disposition Plan: Admit to inpatient.    Leeya Rusconi N. [facility] Pager [phone].  If 7PM-7AM, please contact night-coverage [email or web address] Password [facility] 10/13/2014, 4:26 AM

## 2014-10-13 NOTE — Progress Notes (Signed)
ANTIBIOTIC CONSULT NOTE - INITIAL  Pharmacy Consult for Cefepime and vancomycin Indication: HCAP  No Known Allergies  Patient Measurements: Height: 5\' 4"  (162.6 cm) Weight: 116 lb 6.5 oz (52.8 kg) IBW/kg (Calculated) : 54.7   Vital Signs: Temp: 98.4 F (36.9 C) (04/07 0332) Temp Source: Oral (04/07 0332) BP: 98/53 mmHg (04/07 0332) Pulse Rate: 110 (04/07 0332) Intake/Output from previous day:   Intake/Output from this shift:    Labs:  Recent Labs  10/12/14 2345  WBC 17.7*  HGB 11.1*  PLT 376  CREATININE 4.40*   Estimated Creatinine Clearance: 7.9 mL/min (by C-G formula based on Cr of 4.4). No results for input(s): VANCOTROUGH, VANCOPEAK, VANCORANDOM, GENTTROUGH, GENTPEAK, GENTRANDOM, TOBRATROUGH, TOBRAPEAK, TOBRARND, AMIKACINPEAK, AMIKACINTROU, AMIKACIN in the last 72 hours.   Microbiology: No results found for this or any previous visit (from the past 720 hour(s)).  Medical History: Past Medical History  Diagnosis Date  . Anemia 03/06/2011  . Hypertension   . Coronary artery disease   . Aortic stenosis, mild     severe aortic stenosis  . A-fib   . Acute CHF   . Myocardial infarction 04/2013  . Renal disorder     dialysis  . Dialysis patient   . Hyperlipidemia     Medications:  Scheduled:  . amiodarone  200 mg Oral Daily  . aspirin  81 mg Oral Daily  . atorvastatin  40 mg Oral Daily  . clopidogrel  75 mg Oral Daily  . darbepoetin  100 mcg Subcutaneous See admin instructions  . [START ON 10/14/2014] doxercalciferol  3.5 mcg Intravenous Q M,W,F-HD  . enoxaparin (LOVENOX) injection  30 mg Subcutaneous Q24H  . folic acid  1 mg Oral Daily  . metoprolol tartrate  25 mg Oral BID  . multivitamin  1 tablet Oral Daily  . sevelamer carbonate  1,600 mg Oral BID WC  . sodium bicarbonate  325 mg Oral BID  . sodium chloride  3 mL Intravenous Q12H  . Warfarin - Pharmacist Dosing Inpatient   Does not apply q1800   Assessment: 81 y.o female admitted with  SOB/weakness. She is  to start on IV vancomycin and cefepime for HCAP. She has a h/o ESRD with most recent hemodialysis session was on 10/12/14 (qMWF).   Goal of Therapy:  Pre HD vancomycin level =15-25 mcg/ml  Plan:  Cefepime 2g IV today then 2gm IV qHD (MWF).  Vancomycin 1250 mg IV x1 then 500 mg IV qHD (MWF).  Monitor pre dialysis vancomycin level around 3rd or 4th dose.    [name], RPh Clinical Pharmacist Pager: [phone] 10/13/2014,4:28 AM

## 2014-10-13 NOTE — Consult Note (Signed)
Admit date: 10/12/2014 Referring Physician  Dr. Gonzella Lexhungel Primary Physician Evlyn CourierHILL,GERALD K, MD Primary Cardiologist  Dr. Allyson SabalBerry Reason for Consultation  Aortic stenosis with acute respiratory failure  HPI: [age] year old on hemodialysis with end-stage renal disease, severe aortic stenosis with recent echocardiogram showing valve area of 0.7 cm with admission for acute respiratory failure She was recently seen on 10/04/14 by Dr. Allyson SabalBerry in the outpatient setting and he was referring her to Dr. Excell Seltzerooper for further evaluation of possible TAVR.  On 10/13/14 she came to the emergency room with shortness of breath. This had been intermittent over the past 2 weeks and have been associated with nonproductive cough over the past 2-3 days. She denied any subjective fevers or chest pain. In the emergency department she was found to be in atrial fibrillation which is known to her past medical history, with rapid ventricular response and CT of chest was performed without contrast and showed pulmonary edema. She is a left AV fistula. Her medications included amiodarone, aspirin, clopidogrel, warfarin.  Ejection fraction 60-65% with significant diastolic dysfunction, grade 3.  Her last catheterization was on 05/19/13 by Dr. Nicki Guadalajarahomas Kelly. This was performed after her catheterization on November 7 where she underwent a difficult but successful complex intervention to a severely calcified subtotal proximal and mid right coronary artery. She also had severe concomitant CAD of her circumflex vessel with total occlusion of the mid AV groove circumflex. She underwent complex intervention to the circumflex system during second cardiac catheterization.  PMH:   Past Medical History  Diagnosis Date  . Anemia 03/06/2011  . Hypertension   . Coronary artery disease   . Aortic stenosis, mild     severe aortic stenosis  . A-fib   . Acute CHF   . Myocardial infarction 04/2013  . Renal disorder     dialysis  . Dialysis patient     . Hyperlipidemia     PSH:   Past Surgical History  Procedure Laterality Date  . Av fistula placement      L arm  . Other surgical history Bilateral     Surgery on both arms.   . Fracture surgery  Bil. Arms  . Cardiac catheterization    . Pseudoaneurysm repair Right     leg  . Cataract extraction w/phaco Right 10/19/2013    Procedure: CATARACT EXTRACTION PHACO AND INTRAOCULAR LENS PLACEMENT (IOC);  Surgeon: Loraine LericheMark T. Nile RiggsShapiro, MD;  Location: [facility];  Service: Ophthalmology;  Laterality: Right;  CDE:15.66  . Cataract extraction w/phaco Left 11/02/2013    Procedure: CATARACT EXTRACTION PHACO AND INTRAOCULAR LENS PLACEMENT (IOC);  Surgeon: Loraine LericheMark T. Nile RiggsShapiro, MD;  Location: [facility];  Service: Ophthalmology;  Laterality: Left;  CDE:8.78  . Left and right heart catheterization with coronary angiogram N/A 05/11/2013    Procedure: LEFT AND RIGHT HEART CATHETERIZATION WITH CORONARY ANGIOGRAM;  Surgeon: Runell GessJonathan J Berry, MD;  [facility];  Service: Cardiovascular;  Laterality: N/A;  . Percutaneous coronary rotoblator intervention (pci-r) N/A 05/14/2013    Procedure: PERCUTANEOUS CORONARY ROTOBLATOR INTERVENTION (PCI-R);  Surgeon: Lennette Biharihomas A Kelly, MD;  [facility];  Service: Cardiovascular;  Laterality: N/A;  . Percutaneous coronary rotoblator intervention (pci-r) N/A 05/19/2013    Procedure: PERCUTANEOUS CORONARY ROTOBLATOR INTERVENTION (PCI-R);  Surgeon: Lennette Biharihomas A Kelly, MD;  [facility];  Service: Cardiovascular;  Laterality: N/A;   Allergies:  Review of patient's allergies indicates no known allergies. Prior to Admit Meds:   Prior to Admission medications   Medication Sig  Start Date End Date Taking? Authorizing Provider  amiodarone (PACERONE) 200 MG tablet TAKE ONE (1) TABLET BY MOUTH EVERY DAY 03/11/14  Yes Chrystie Nose, MD  aspirin 81 MG EC tablet Take 81 mg by mouth daily. 05/22/13  Yes Lonia Blood, MD  atorvastatin (LIPITOR) 40 MG tablet Take 40 mg by mouth daily.      Yes Historical Provider, MD  clopidogrel (PLAVIX) 75 MG tablet Take 1 tablet (75 mg total) by mouth daily. <please make appointment for refills> Patient not taking: Reported on 10/13/2014 08/03/14   Runell Gess, MD  clopidogrel (PLAVIX) 75 MG tablet TAKE ONE (1) TABLET EACH DAY 09/01/14  Yes Runell Gess, MD  darbepoetin (ARANESP) 100 MCG/0.5ML SOLN injection Inject 100 mcg into the skin See admin instructions. Takes every Monday, Wednesday and Friday with dialysis   Yes Historical Provider, MD  doxercalciferol (HECTOROL) 4 MCG/2ML injection Inject 3.5 mcg into the vein every Monday, Wednesday, and Friday with hemodialysis.   Yes Historical Provider, MD  folic acid (FOLVITE) 1 MG tablet Take 1 mg by mouth daily.   Yes Historical Provider, MD  metoprolol tartrate (LOPRESSOR) 25 MG tablet TAKE ONE TABLET BY MOUTH TWICE A DAY 03/11/14  Yes Chrystie Nose, MD  multivitamin (RENA-VIT) TABS tablet Take 1 tablet by mouth daily.   Yes Historical Provider, MD  sevelamer carbonate (RENVELA) 800 MG tablet Take 1,600 mg by mouth 2 (two) times daily with a meal.    Yes Historical Provider, MD  sodium bicarbonate 325 MG tablet Take 325 mg by mouth 2 (two) times daily.   Yes Historical Provider, MD  warfarin (COUMADIN) 3 MG tablet Take 1.5-3 mg by mouth daily. Takes 3 mg on Sun, Tue, Wed, Thu, and Sat. Takes 1.5mg  on Mon & Fri. 06/08/13  Yes Kristin L Alvstad, RPH-CPP  warfarin (COUMADIN) 3 MG tablet TAKE ONE TABLET ( ) ONCE DAILY Patient not taking: Reported on 10/13/2014 05/23/14   Rosalee Kaufman, RPH-CPP  warfarin (COUMADIN) 3 MG tablet TAKE ONE (1) TABLET EACH DAY Patient not taking: Reported on 10/13/2014 08/04/14   Dwana Melena, PA-C   Fam HX:   No early family history of CAD Social HX:    History   Social History  . Marital Status: Married    Spouse Name: N/A  . Number of Children: N/A  . Years of Education: N/A   Occupational History  . Not on file.   Social History Main Topics  .  Smoking status: Never Smoker   . Smokeless tobacco: Never Used  . Alcohol Use: No  . Drug Use: No  . Sexual Activity: Yes    Birth Control/ Protection: Post-menopausal   Other Topics Concern  . Not on file   Social History Narrative     ROS:  Positive for weakness, no bleeding, no syncope, no bruising. All 11 ROS were addressed and are negative except what is stated in the HPI   Physical Exam: Blood pressure 98/53, pulse 110, temperature 98.4 F (36.9 C), temperature source Oral, resp. rate 18, height  (1.626 m), weight 116 lb 6.5 oz (52.8 kg), SpO2 95 %.   General: Frail, in bed,  in no acute distress Head: Eyes PERRLA, No xanthomas.   Normal cephalic and atramatic  Lungs:   Clear bilaterally to auscultation and percussion. Normal respiratory effort. No wheezes, no rales. Heart:   Irreg irreg, mildly tachycardic Pulses diminished & equal. 3/6 systolic right upper sternal border murmur, no rubs,  gallops.  No carotid bruit. No JVD.  No abdominal bruits. Left forearm fistula Abdomen: Bowel sounds are positive, abdomen soft and non-tender without masses. No hepatosplenomegaly. Msk:  Back normal. Normal strength and tone for age. Extremities:  No clubbing, cyanosis or edema.  DP +1, left shunt Neuro: Alert and oriented X 3, non-focal, MAE x 4 GU: Deferred Rectal: Deferred Psych:  Good affect, responds appropriately      Labs: Lab Results  Component Value Date   WBC 17.5* 10/13/2014   HGB 10.1* 10/13/2014   HCT 32.0* 10/13/2014   MCV 87.0 10/13/2014   PLT 363 10/13/2014     Recent Labs Lab 10/13/14 0512  NA 137  K 3.0*  CL 96  CO2 32  BUN 18  CREATININE 4.66*  CALCIUM 8.6  PROT 5.9*  BILITOT 0.6  ALKPHOS 67  ALT 9  AST 15  GLUCOSE 86    Recent Labs  10/13/14 0512  TROPONINI 0.08*   Lab Results  Component Value Date   CHOL 154 08/09/2014   HDL 57 08/09/2014   LDLCALC 79 08/09/2014   TRIG 90 08/09/2014      Radiology:  Dg Chest 2  View  10/13/2014   CLINICAL DATA:  Shortness of breath and chest discomfort  EXAM: CHEST  2 VIEW  COMPARISON:  05/07/2013  FINDINGS: Chronic cardiopericardial enlargement and aortic tortuosity. Extensive coronary atherosclerosis. There is no edema, consolidation, effusion, or pneumothorax. Remote upper thoracic compression fracture with height loss stable from 2014.  IMPRESSION: Cardiomegaly without failure.   Electronically Signed   By: Marnee Spring [name]   On: 10/13/2014 00:24   Ct Chest Wo Contrast  10/13/2014   CLINICAL DATA:  Weakness and shortness of breath  EXAM: CT CHEST WITHOUT CONTRAST  TECHNIQUE: Multidetector CT imaging of the chest was performed following the standard protocol without IV contrast.  COMPARISON:  None.  FINDINGS: THORACIC INLET/BODY WALL:  No acute abnormality.  MEDIASTINUM:  There is chronic cardiomegaly with multi chamber enlargement. Bulky aortic valvular calcifications/ sclerosis. There is extensive aortic and mitral annular calcification as well. Diffuse atherosclerotic calcification, throughout both the left and right coronary circulation. No acute vascular findings. A low-density 14 mm ovoid nodule just above the aortic hiatus is the cisterna chyli.  LUNG WINDOWS:  There is no pneumonia. Minimal interlobular septal and fissural thickening. No pleural effusion or alveolar edema.  UPPER ABDOMEN:  Severe renal atrophy. There is a partially visualized presumed 4 cm cyst in the upper pole left kidney.  OSSEOUS:  Remote T4 and T6 compression fractures. Height loss is greater at T6, near 75%.  IMPRESSION: 1. Minimal interstitial pulmonary edema. 2. Extensive aortic valvular calcifications/sclerosis.   Electronically Signed   By: Marnee Spring [name]   On: 10/13/2014 01:39   Personally viewed.  EKG: 10/12/14-Atrial fibrillation heart rate 111, nonspecific ST-T wave changes  Personally viewed.   Echocardiogram: 08/18/14          *Card[facility]ovascular Imaging at [facility]          [address], [address]            [place], [place] [address]              [phone]  ------------------------------------------------------------------- Transthoracic Echocardiography  Patient:  Genna, Casimir MR #:    [id number] Study Date: 08/18/2014 Gender:   F Age:    76 Height:   162.6 cm Weight:   55.3 kg BSA:    1.58 m^2 Pt. Status: Room:  ORDERING   Nanetta Batty, MD REFERRING  Nanetta Batty, MD REFERRING  Mirna Mires [id number] ATTENDING  Zoila Shutter MD PERFORMING  [facility], Outpatient SONOGRAPHER [facility], RDCS  cc:  ------------------------------------------------------------------- LV EF: 60% -  65%  ------------------------------------------------------------------- Indications:   Aortic Stenosis (I35.0).  ------------------------------------------------------------------- History:  PMH: NSTEMI, Chronic Kidney Disease with Hemodialysis. Atrial fibrillation. Coronary artery disease. Aortic valve disease. Risk factors: Hypertension. Diabetes mellitus. Dyslipidemia.  ------------------------------------------------------------------- Study Conclusions  - Left ventricle: The cavity size was normal. There was mild focal basal hypertrophy of the septum. Systolic function was normal. The estimated ejection fraction was in the range of 60% to 65%. Wall motion was normal; there were no regional wall motion abnormalities. Doppler parameters are consistent with a reversible restrictive pattern, indicative of decreased left ventricular diastolic compliance and/or increased left atrial pressure (grade 3 diastolic dysfunction). Doppler parameters are consistent with both elevated ventricular end-diastolic filling pressure and elevated left atrial filling pressure. - Aortic valve: Trileaflet; severely thickened, severely calcified leaflets. Valve mobility  was restricted. There was moderate to severe stenosis. Mean gradient (S): 33 mm Hg. Peak gradient (S): 55 mm Hg. Valve area (VTI): 0.7 cm^2. Valve area (Vmax): 0.73 cm^2. Valve area (Vmean): 0.65 cm^2. - Aortic root: The aortic root was normal in size. - Mitral valve: Calcified annulus. Severely thickened, severely calcified leaflets . Mobility was restricted. The findings are consistent with mild to moderate stenosis. There was moderate regurgitation. Mean gradient (D): 5 mm Hg. Peak gradient (D): 14 mm Hg. Valve area by continuity equation (using LVOT flow): 1.3 cm^2. - Left atrium: The atrium was moderately to severely dilated. - Right ventricle: The cavity size was mildly dilated. Wall thickness was normal. Systolic function was mildly reduced. - Right atrium: The atrium was moderately dilated. - Tricuspid valve: There was mild regurgitation. - Pulmonary arteries: Systolic pressure was moderately increased. PA peak pressure: 51 mm Hg (S).     ASSESSMENT/PLAN:    81 year old female with severe aortic stenosis by valve area calculation, coronary artery disease status post complex intervention to RCA as well as circumflex in November 2014, end-stage renal disease on hemodialysis Mondays Wednesday Friday, admitted with pulmonary edema, shortness of breath, respiratory failure with hypoxia and atrial fibrillation with rapid ventricular response.  1. Severe aortic stenosis-by valve area calculation less than 1 cm. Mean gradient however was 33 mmHg, not greater than 40. Hemodynamically, this is likely playing a role in her respiratory difficulties. Main intervention at this point is to provide added hemodialysis to decrease intravascular volume. Echocardiogram demonstrated grade 3 diastolic dysfunction. High filling pressures noted. Eventually, formal consultation with Dr. Excell Seltzer, likely as outpatient, would be beneficial to see if she would be a candidate for TAVR.  Currently she is more comfortable in bed. Does not appear to be in any respiratory distress. Earlier in the emergency room however, she could not complete full sentences her daughter states.  She is being treated with antibiotic therapy in case pneumonia is present. White blood cell count 17,000.  2. Coronary artery disease-status post complex interventions to RCA and circumflex in 2014. Has been on triple therapy, aspirin, clopidogrel, warfarin. Stable.  3. Atrial fibrillation with rapid ventricular response-normally paroxysmal, currently on amiodarone 200 mg a day as well as metoprolol 25 mg twice a day at home. May need to increase metoprolol as tolerated. Hypotension may limit. Her heart rate is being maintained less than 110 on average.  4. Chronic anticoagulation-continue with warfarin. Would consider discontinuation of aspirin given her  Plavix and warfarin use. Ultimately, decision can be made by Dr. Allyson Sabal who has been maintaining this medical regimen.  We will follow along with you. Thankfully, she is feeling better.  Donato Schultz, MD  10/13/2014  8:24 AM

## 2014-10-13 NOTE — Progress Notes (Signed)
Utilization review completed.  

## 2014-10-13 NOTE — Progress Notes (Signed)
[facility] PROGRESS NOTE  Brittany BadgerLottie V Beard ZOX:[id number] DOB: 04/04/1928 DOA: 10/12/2014 PCP: Evlyn CourierHILL,GERALD K, MD  Brief narrative 81 y/o female with ESRD on HD ( MWF), recent echo with severe AS, CAD with history of stent, chronic diastolic CHF,  A. fib on Coumadin and amiodarone, chronic anemia, presented to the ED with acute onset of shortness of breath. She reports having off-and-on shortness of breath for the past 2 weeks which became worse last evening. He also reports nonproductive cough for the past 2-3 days without any fevers or chills. In the ED patient was found to be in rapid A. fib. Chest CT without contrast done showed pulmonary edema. Patient admitted to telemetry.   Assessment/Plan: Acute hypoxic respiratory failure Likely a combination of volume overload with CHF worsened by rapid A. fib. Also possible for healthcare associated pneumonia with leukocytosis, nonproductive cough and elevated lactate. Symptoms are also worsened with severe aortic stenosis. Patient is more stable this morning but still has some dyspnea. Seen by cardiology and recommend dialysis to improve her volume status. Recommend outpatient follow-up with Dr. Excell Seltzerooper to evaluate for TAVR.  A. fib with RVR Currently rate controlled. Continue amiodarone and metoprolol. Will increase dose of metoprolol as blood pressure permits. Coumadin dosing per pharmacy. Patient on aspirin and Plavix as well which we increase her bleeding risk along with Coumadin. Will defer to her primary cardiologist to discontinue one of the antiplatelet.  Elevated troponin Mild. Likely demand ischemia  End-stage renal disease on dialysis  Reports having regular dialysis yesterday. Seen by renal consult and recommend patient can wait until tomorrow for dialysis as she is not in distress. Continue weekly Aranesp injection. Pectoral with dialysis. Continue renvela.  ? HCAP with early sepsis Patient presented with cough and significant  leukocytosis associated with shortness of breath. Placed on empiric vancomycin and cefepime. Follow blood culture.  Hypokalemia Replace potassium  DVT prophylaxis: On Coumadin Diet: Heart healthy/renal  Code Status: Full code Family Communication: Daughter at bedside Disposition Plan: Currently inpatient   Consultants:  Cardiology  renal  Procedures:  None  Antibiotics:  Vancomycin and cefepime  HPI/Subjective: Patient seen and examined. She has some shortness of breath but denies any chest pain or palpitations. [facility] reviewed.  Objective: Filed Vitals:   10/13/14 0332  BP: 98/53  Pulse: 110  Temp: 98.4 F (36.9 C)  Resp: 18    Intake/Output Summary (Last 24 hours) at 10/13/14 1108 Last data filed at 10/13/14 0800  Gross per 24 hour  Intake    240 ml  Output      0 ml  Net    240 ml   Filed Weights   10/12/14 2333 10/13/14 0209  Weight: 50.803 kg (112 lb) 52.8 kg (116 lb 6.5 oz)    Exam:   General:  Elderly female in no acute distress  HEENT: No pallor, moist oral mucosa, mild JVD,  Cardiovascular: S1 and S2 irregularly irregular, systolic murmur 3/6  Respiratory: Fine bibasilar crackles, no rhonchi or wheeze  Abdomen: Soft, nondistended, nontender, bowel sounds present  Musculoskeletal: Warm, trace edema bilaterally  CNS: Alert and oriented  Data Reviewed: Basic Metabolic Panel:  Recent Labs Lab 10/12/14 2345 10/13/14 0512  NA 135 137  K 3.9 3.0*  CL 93* 96  CO2 32 32  GLUCOSE 199* 86  BUN 16 18  CREATININE 4.40* 4.66*  CALCIUM 8.5 8.6  MG 1.9  --    Liver Function Tests:  Recent Labs Lab 10/12/14 2345 10/13/14 [id number]  AST 33 15  ALT 10 9  ALKPHOS 73 67  BILITOT 1.1 0.6  PROT 6.5 5.9*  ALBUMIN 2.6* 2.3*    Recent Labs Lab 10/12/14 2345  LIPASE 27   No results for input(s): AMMONIA in the last 168 hours. CBC:  Recent Labs Lab 10/12/14 2345 10/13/14 0512  WBC 17.7* 17.5*  NEUTROABS 13.9* 12.2*  HGB  11.1* 10.1*  HCT 35.0* 32.0*  MCV 87.3 87.0  PLT 376 363   Cardiac Enzymes:  Recent Labs Lab 10/13/14 0512 10/13/14 1012  TROPONINI 0.08* 0.06*   BNP (last 3 results)  Recent Labs  10/12/14 2345  BNP >4500.0*    ProBNP (last 3 results) No results for input(s): PROBNP in the last 8760 hours.  CBG:  Recent Labs Lab 10/12/14 2350  GLUCAP 180*    No results found for this or any previous visit (from the past 240 hour(s)).   Studies: Dg Chest 2 View  10/13/2014   CLINICAL DATA:  Shortness of breath and chest discomfort  EXAM: CHEST  2 VIEW  COMPARISON:  05/07/2013  FINDINGS: Chronic cardiopericardial enlargement and aortic tortuosity. Extensive coronary atherosclerosis. There is no edema, consolidation, effusion, or pneumothorax. Remote upper thoracic compression fracture with height loss stable from 2014.  IMPRESSION: Cardiomegaly without failure.   Electronically Signed   By: Marnee Spring [name]   On: 10/13/2014 00:24   Ct Chest Wo Contrast  10/13/2014   CLINICAL DATA:  Weakness and shortness of breath  EXAM: CT CHEST WITHOUT CONTRAST  TECHNIQUE: Multidetector CT imaging of the chest was performed following the standard protocol without IV contrast.  COMPARISON:  None.  FINDINGS: THORACIC INLET/BODY WALL:  No acute abnormality.  MEDIASTINUM:  There is chronic cardiomegaly with multi chamber enlargement. Bulky aortic valvular calcifications/ sclerosis. There is extensive aortic and mitral annular calcification as well. Diffuse atherosclerotic calcification, throughout both the left and right coronary circulation. No acute vascular findings. A low-density 14 mm ovoid nodule just above the aortic hiatus is the cisterna chyli.  LUNG WINDOWS:  There is no pneumonia. Minimal interlobular septal and fissural thickening. No pleural effusion or alveolar edema.  UPPER ABDOMEN:  Severe renal atrophy. There is a partially visualized presumed 4 cm cyst in the upper pole left kidney.  OSSEOUS:   Remote T4 and T6 compression fractures. Height loss is greater at T6, near 75%.  IMPRESSION: 1. Minimal interstitial pulmonary edema. 2. Extensive aortic valvular calcifications/sclerosis.   Electronically Signed   By: Marnee Spring [name]   On: 10/13/2014 01:39    Scheduled Meds: . amiodarone  200 mg Oral Daily  . aspirin EC  81 mg Oral Daily  . atorvastatin  40 mg Oral Daily  . [START ON 10/14/2014] ceFEPime (MAXIPIME) IV  2 g Intravenous Q M,W,F-HD  . clopidogrel  75 mg Oral Daily  . [START ON 10/14/2014] Darbepoetin Alfa  40 mcg Intravenous Q Fri-HD  . [START ON 10/14/2014] doxercalciferol  2.5 mcg Intravenous Q M,W,F-HD  . folic acid  1 mg Oral Daily  . metoprolol tartrate  25 mg Oral BID  . multivitamin  1 tablet Oral Daily  . sevelamer carbonate  1,600 mg Oral TID WC  . sodium chloride  3 mL Intravenous Q12H  . [START ON 10/14/2014] vancomycin  500 mg Intravenous Q M,W,F-HD  . Warfarin - Pharmacist Dosing Inpatient   Does not apply q1800   Continuous Infusions:     Time spent: 20 minutes    [name]  [facility] Pager [phone]. If 7PM-7AM, please contact night-coverage at [email or web address], password [facility] 10/13/2014, 11:08 AM  LOS: 0 days

## 2014-10-14 DIAGNOSIS — I481 Persistent atrial fibrillation: Secondary | ICD-10-CM

## 2014-10-14 DIAGNOSIS — J189 Pneumonia, unspecified organism: Secondary | ICD-10-CM | POA: Diagnosis present

## 2014-10-14 LAB — PROTIME-INR
INR: 3.99 — ABNORMAL HIGH (ref 0.00–1.49)
PROTHROMBIN TIME: 39.2 s — AB (ref 11.6–15.2)

## 2014-10-14 LAB — HEMOGLOBIN A1C
Hgb A1c MFr Bld: 5.8 % — ABNORMAL HIGH (ref 4.8–5.6)
MEAN PLASMA GLUCOSE: 120 mg/dL

## 2014-10-14 LAB — CBC
HEMATOCRIT: 30.8 % — AB (ref 36.0–46.0)
Hemoglobin: 9.8 g/dL — ABNORMAL LOW (ref 12.0–15.0)
MCH: 27.5 pg (ref 26.0–34.0)
MCHC: 31.8 g/dL (ref 30.0–36.0)
MCV: 86.3 fL (ref 78.0–100.0)
Platelets: 371 10*3/uL (ref 150–400)
RBC: 3.57 MIL/uL — ABNORMAL LOW (ref 3.87–5.11)
RDW: 15.4 % (ref 11.5–15.5)
WBC: 17.6 10*3/uL — AB (ref 4.0–10.5)

## 2014-10-14 LAB — BASIC METABOLIC PANEL
Anion gap: 8 (ref 5–15)
BUN: <5 mg/dL — ABNORMAL LOW (ref 6–23)
CO2: 32 mmol/L (ref 19–32)
Calcium: 8.2 mg/dL — ABNORMAL LOW (ref 8.4–10.5)
Chloride: 100 mmol/L (ref 96–112)
Creatinine, Ser: 0.52 mg/dL (ref 0.50–1.10)
GFR calc Af Amer: >90 mL/min (ref >90)
GFR calc non Af Amer: 85 mL/min — ABNORMAL LOW (ref >90)
GLUCOSE: 97 mg/dL (ref 70–99)
POTASSIUM: 4.1 mmol/L (ref 3.5–5.1)
SODIUM: 140 mmol/L (ref 135–145)

## 2014-10-14 MED ORDER — AMIODARONE HCL 200 MG PO TABS: 400.0000 mg | ORAL_TABLET | Freq: Every day | ORAL | Status: DC

## 2014-10-14 MED ORDER — AMIODARONE HCL 200 MG PO TABS
400.0000 mg | ORAL_TABLET | Freq: Every day | ORAL | Status: DC
  Administered 2014-10-14 – 2014-10-15 (×2): 400 mg via ORAL
  Filled 2014-10-14 (×2): qty 2

## 2014-10-14 MED ORDER — DARBEPOETIN ALFA 40 MCG/0.4ML IJ SOSY
PREFILLED_SYRINGE | INTRAMUSCULAR | Status: AC
  Filled 2014-10-14: qty 0.4

## 2014-10-14 MED ORDER — DOXERCALCIFEROL 4 MCG/2ML IV SOLN
INTRAVENOUS | Status: AC
  Filled 2014-10-14: qty 2

## 2014-10-14 NOTE — Progress Notes (Signed)
ANTICOAGULATION CONSULT NOTE - Follow Up Consult  Pharmacy Consult for warfarin Indication: atrial fibrillation  No Known Allergies  Patient Measurements: Height: 5\' 4"  (162.6 cm) Weight: 112 lb 7 oz (51 kg) IBW/kg (Calculated) : 54.7  Vital Signs: Temp: 97 F (36.1 C) (04/08 1118) Temp Source: Oral (04/08 1118) BP: 118/52 mmHg (04/08 1118) Pulse Rate: 68 (04/08 1118)  Labs:  Recent Labs  10/12/14 2345 10/13/14 0427 10/13/14 0512 10/13/14 1012 10/13/14 1655 10/14/14 0838 10/14/14 0900  HGB 11.1*  --  10.1*  --   --  9.8*  --   HCT 35.0*  --  32.0*  --   --  30.8*  --   PLT 376  --  363  --   --  371  --   LABPROT 38.1* 36.9*  --   --   --  39.2*  --   INR 3.84* 3.69*  --   --   --  3.99*  --   CREATININE 4.40*  --  4.66*  --   --   --  0.52  TROPONINI  --   --  0.08* 0.06* 0.06*  --   --     Estimated Creatinine Clearance: 42.1 mL/min (by C-G formula based on Cr of 0.52).  Assessment: 81 y/o female with ESRD on HD MWF admitted with weakness and SOB. She takes chronic warfarin for Afib and INR was supratherapeutic on admit.  INR remains supratherapeutic this morning at 3.99. No bleeding noted, Hb has trended down to 9.8, platelets are stable.  PTA: 3 mg daily except 1.5 mg Mon/Fri, last dose 4/6 at home  Goal of Therapy:  INR 2-3 Monitor platelets by anticoagulation protocol: Yes   Plan:  - No warfarin today - INR daily - Monitor for s/sx of bleeding  Woodlands Endoscopy CenterJennifer Economy, Great RiverPharm.D., BCPS Clinical Pharmacist Pager: [phone] 10/14/2014 11:50 AM

## 2014-10-14 NOTE — Progress Notes (Signed)
[facility] PROGRESS NOTE  Brittany BadgerLottie V Beard YNW:[id number] DOB: 08/15/1927 DOA: 10/12/2014 PCP: Evlyn CourierHILL,GERALD K, MD  Brief narrative 81 y/o female with ESRD on HD ( MWF), recent echo with severe AS, CAD with history of stent, chronic diastolic CHF,  A. fib on Coumadin and amiodarone, chronic anemia, presented to the ED with acute onset of shortness of breath. She reports having off-and-on shortness of breath for the past 2 weeks which became worse last evening. He also reports nonproductive cough for the past 2-3 days without any fevers or chills. In the ED patient was found to be in rapid A. fib. Chest CT without contrast done showed pulmonary edema. Patient admitted to telemetry.   Assessment/Plan: Acute hypoxic respiratory failure Likely a combination of volume overload with CHF worsened by rapid A. fib. Also possible for healthcare associated pneumonia with leukocytosis, nonproductive cough and elevated lactate. Symptoms  worsened with severe aortic stenosis. HD done this am with improvement of  dyspnea. Seen by cardiology. She has appt with Dr Dicie BeamMcalheny on 4/12 and will be discussed about TAVR.   A. fib with RVR rate controlled. Continue amiodarone dose increased. Continue  metoprolol.  Coumadin dosing per pharmacy. Patient on aspirin and Plavix as well which we increase her bleeding risk along with Coumadin. Will defer to her primary cardiologist to discontinue one of the antiplatelet.  Elevated troponin Mild. Likely demand ischemia  End-stage renal disease on dialysis  HD done today with improvement in dyspnea Continue weekly Aranesp injection. Pectoral with dialysis. Continue renvela.  ? HCAP with early sepsis Patient presented with cough and significant leukocytosis associated with shortness of breath. Placed on empiric vancomycin and cefepime. Follow blood culture.  Hypokalemia Replenished   DVT prophylaxis: On Coumadin Diet: Heart healthy/renal  Code Status: Full  code Family Communication: Daughter at bedside Disposition Plan: Currently inpatient. Possibly home tomorrow if clinically improved   Consultants:  Cardiology  renal  Procedures:  None  Antibiotics:  Vancomycin and cefepime  HPI/Subjective: Patient seen and examined after returning from HD. shortness of breath improved. Afebrile.  Objective: Filed Vitals:   10/14/14 1400  BP: 113/53  Pulse: 72  Temp: 97.1 F (36.2 C)  Resp: 17    Intake/Output Summary (Last 24 hours) at 10/14/14 1525 Last data filed at 10/14/14 1300  Gross per 24 hour  Intake    480 ml  Output   2850 ml  Net  -2370 ml   Filed Weights   10/14/14 0625 10/14/14 0718 10/14/14 1118  Weight: 53.479 kg (117 lb 14.4 oz) 53.9 kg (118 lb 13.3 oz) 51 kg (112 lb 7 oz)    Exam:   General:  Elderly female in no acute distress  HEENT:  moist oral mucosa, no JVD,  Cardiovascular: S1 and S2 irregularly irregular, systolic murmur 3/6  Respiratory: Fine  crackles over Lt lung base, no rhonchi or wheeze  Abdomen: Soft, nondistended, nontender, bowel sounds present  Musculoskeletal: Warm, no edema  CNS: Alert and oriented  Data Reviewed: Basic Metabolic Panel:  Recent Labs Lab 10/12/14 2345 10/13/14 0512 10/13/14 1051 10/14/14 0900  NA 135 137  --  140  K 3.9 3.0*  --  4.1  CL 93* 96  --  100  CO2 32 32  --  32  GLUCOSE 199* 86  --  97  BUN 16 18  --  <5*  CREATININE 4.40* 4.66*  --  0.52  CALCIUM 8.5 8.6  --  8.2*  MG 1.9  --   --   --  PHOS  --   --  2.6  --    Liver Function Tests:  Recent Labs Lab 10/12/14 2345 10/13/14 0512  AST 33 15  ALT 10 9  ALKPHOS 73 67  BILITOT 1.1 0.6  PROT 6.5 5.9*  ALBUMIN 2.6* 2.3*    Recent Labs Lab 10/12/14 2345  LIPASE 27   No results for input(s): AMMONIA in the last 168 hours. CBC:  Recent Labs Lab 10/12/14 2345 10/13/14 0512 10/14/14 0838  WBC 17.7* 17.5* 17.6*  NEUTROABS 13.9* 12.2*  --   HGB 11.1* 10.1* 9.8*  HCT 35.0*  32.0* 30.8*  MCV 87.3 87.0 86.3  PLT 376 363 371   Cardiac Enzymes:  Recent Labs Lab 10/13/14 0512 10/13/14 1012 10/13/14 1655  TROPONINI 0.08* 0.06* 0.06*   BNP (last 3 results)  Recent Labs  10/12/14 2345  BNP >4500.0*    ProBNP (last 3 results) No results for input(s): PROBNP in the last 8760 hours.  CBG:  Recent Labs Lab 10/12/14 2350  GLUCAP 180*    Recent Results (from the past 240 hour(s))  Culture, blood (routine x 2)     Status: None (Preliminary result)   Collection Time: 10/13/14  7:02 AM  Result Value Ref Range Status   Specimen Description BLOOD RIGHT WRIST  Final   Special Requests BOTTLES DRAWN AEROBIC AND ANAEROBIC 10CC  Final   Culture   Final           BLOOD CULTURE RECEIVED NO GROWTH TO DATE CULTURE WILL BE HELD FOR 5 DAYS BEFORE ISSUING A FINAL NEGATIVE REPORT Note: Culture results may be compromised due to an excessive volume of blood received in culture bottles. Performed at [facility]    Report Status PENDING  Incomplete  Culture, blood (routine x 2)     Status: None (Preliminary result)   Collection Time: 10/13/14  7:15 AM  Result Value Ref Range Status   Specimen Description BLOOD RIGHT HAND  Final   Special Requests BOTTLES DRAWN AEROBIC AND ANAEROBIC 10CC  Final   Culture   Final           BLOOD CULTURE RECEIVED NO GROWTH TO DATE CULTURE WILL BE HELD FOR 5 DAYS BEFORE ISSUING A FINAL NEGATIVE REPORT Note: Culture results may be compromised due to an excessive volume of blood received in culture bottles. Performed at [facility]    Report Status PENDING  Incomplete     Studies: Dg Chest 2 View  10/13/2014   CLINICAL DATA:  Shortness of breath and chest discomfort  EXAM: CHEST  2 VIEW  COMPARISON:  05/07/2013  FINDINGS: Chronic cardiopericardial enlargement and aortic tortuosity. Extensive coronary atherosclerosis. There is no edema, consolidation, effusion, or pneumothorax. Remote upper thoracic compression  fracture with height loss stable from 2014.  IMPRESSION: Cardiomegaly without failure.   Electronically Signed   By: Marnee Spring [name]   On: 10/13/2014 00:24   Ct Chest Wo Contrast  10/13/2014   CLINICAL DATA:  Weakness and shortness of breath  EXAM: CT CHEST WITHOUT CONTRAST  TECHNIQUE: Multidetector CT imaging of the chest was performed following the standard protocol without IV contrast.  COMPARISON:  None.  FINDINGS: THORACIC INLET/BODY WALL:  No acute abnormality.  MEDIASTINUM:  There is chronic cardiomegaly with multi chamber enlargement. Bulky aortic valvular calcifications/ sclerosis. There is extensive aortic and mitral annular calcification as well. Diffuse atherosclerotic calcification, throughout both the left and right coronary circulation. No acute vascular findings. A low-density 14 mm  ovoid nodule just above the aortic hiatus is the cisterna chyli.  LUNG WINDOWS:  There is no pneumonia. Minimal interlobular septal and fissural thickening. No pleural effusion or alveolar edema.  UPPER ABDOMEN:  Severe renal atrophy. There is a partially visualized presumed 4 cm cyst in the upper pole left kidney.  OSSEOUS:  Remote T4 and T6 compression fractures. Height loss is greater at T6, near 75%.  IMPRESSION: 1. Minimal interstitial pulmonary edema. 2. Extensive aortic valvular calcifications/sclerosis.   Electronically Signed   By: Marnee Spring [name]   On: 10/13/2014 01:39    Scheduled Meds: . amiodarone  400 mg Oral Daily  . aspirin EC  81 mg Oral Daily  . atorvastatin  40 mg Oral Daily  . ceFEPime (MAXIPIME) IV  2 g Intravenous Q M,W,F-HD  . clopidogrel  75 mg Oral Daily  . Darbepoetin Alfa      . Darbepoetin Alfa  40 mcg Intravenous Q Fri-HD  . doxercalciferol      . doxercalciferol  2.5 mcg Intravenous Q M,W,F-HD  . folic acid  1 mg Oral Daily  . metoprolol tartrate  25 mg Oral BID  . multivitamin  1 tablet Oral Daily  . sevelamer carbonate  1,600 mg Oral TID WC  . sodium chloride  3  mL Intravenous Q12H  . vancomycin  500 mg Intravenous Q M,W,F-HD  . Warfarin - Pharmacist Dosing Inpatient   Does not apply q1800   Continuous Infusions:     Time spent: 20 minutes    Adeja Sarratt  [facility] Pager [phone]. If 7PM-7AM, please contact night-coverage at [email or web address], password [facility] 10/14/2014, 3:25 PM  LOS: 1 day

## 2014-10-14 NOTE — Procedures (Signed)
Pt seen on HD.  Ap 160 Vp 180.  BFR 400.  SBP 125.  So far tolerating HD well.  Awaiting plans from cardio on what to do about severe AS.

## 2014-10-14 NOTE — Progress Notes (Signed)
DAILY PROGRESS NOTE  Subjective:  Breathing better.  Eating country ham, bacon, biscuit with gravy when I came into the room - brought by her family. Emphasized the importance of dietary compliance with sodium restriction - given her recent volume overload. Breathing is better after dialysis today, but she feels weak. She says her vision is often blurry after dialysis with "heavy eyelids" - this may be hypotension.  Objective:  Temp:  [97 F (36.1 C)-99.3 F (37.4 C)] 97 F (36.1 C) (04/08 1118) Pulse Rate:  [60-109] 68 (04/08 1118) Resp:  [14-24] 24 (04/08 1118) BP: (91-126)/(46-61) 118/52 mmHg (04/08 1118) SpO2:  [94 %-96 %] 95 % (04/08 1118) Weight:  [112 lb 7 oz (51 kg)-118 lb 13.3 oz (53.9 kg)] 112 lb 7 oz (51 kg) (04/08 1118) Weight change: 5 lb 14.4 oz (2.676 kg)  Intake/Output from previous day: 04/07 0701 - 04/08 0700 In: 600 [P.O.:600] Out: -   Intake/Output from this shift: Total I/O In: -  Out: 2850 [Other:2850]  Medications: Current Facility-Administered Medications  Medication Dose Route Frequency Provider Last Rate Last Dose  . acetaminophen (TYLENOL) tablet 650 mg  650 mg Oral Q6H PRN Rise Patience, MD       Or  . acetaminophen (TYLENOL) suppository 650 mg  650 mg Rectal Q6H PRN Rise Patience, MD      . amiodarone (PACERONE) tablet 200 mg  200 mg Oral Daily Rise Patience, MD   200 mg at 10/13/14 1214  . aspirin EC tablet 81 mg  81 mg Oral Daily Rise Patience, MD   81 mg at 10/13/14 1215  . atorvastatin (LIPITOR) tablet 40 mg  40 mg Oral Daily Rise Patience, MD   40 mg at 10/13/14 2147  . ceFEPIme (MAXIPIME) 2 g in dextrose 5 % 50 mL IVPB  2 g Intravenous Q M,W,F-HD Wendee Beavers, RPH   2 g at 10/14/14 0909  . clopidogrel (PLAVIX) tablet 75 mg  75 mg Oral Daily Rise Patience, MD   75 mg at 10/13/14 2146  . Darbepoetin Alfa (ARANESP) 40 MCG/0.4ML injection           . Darbepoetin Alfa (ARANESP) injection 40 mcg  40 mcg  Intravenous Q Fri-HD Fleet Contras, MD   40 mcg at 10/14/14 0909  . doxercalciferol (HECTOROL) 4 MCG/2ML injection           . doxercalciferol (HECTOROL) injection 2.5 mcg  2.5 mcg Intravenous Q M,W,F-HD Fleet Contras, MD   2.5 mcg at 10/14/14 0908  . folic acid (FOLVITE) tablet 1 mg  1 mg Oral Daily Rise Patience, MD   1 mg at 10/13/14 1214  . metoprolol tartrate (LOPRESSOR) tablet 25 mg  25 mg Oral BID Rise Patience, MD   25 mg at 10/13/14 2146  . multivitamin (RENA-VIT) tablet 1 tablet  1 tablet Oral Daily Rise Patience, MD   1 tablet at 10/13/14 2145  . ondansetron (ZOFRAN) tablet 4 mg  4 mg Oral Q6H PRN Rise Patience, MD       Or  . ondansetron [facility]) injection 4 mg  4 mg Intravenous Q6H PRN Rise Patience, MD      . sevelamer carbonate (RENVELA) tablet 1,600 mg  1,600 mg Oral TID WC Fleet Contras, MD   800 mg at 10/13/14 1740  . sodium chloride 0.9 % injection 3 mL  3 mL Intravenous Q12H Rise Patience, MD   3 mL  at 10/13/14 2200  . vancomycin (VANCOCIN) 500 mg in sodium chloride 0.9 % 100 mL IVPB  500 mg Intravenous Q M,W,F-HD Wendee Beavers, RPH   500 mg at 10/14/14 0954  . Warfarin - Pharmacist Dosing Inpatient   Does not apply q1800 Rise Patience, MD   Stopped at 10/13/14 1800    Physical Exam: General appearance: alert and no distress Neck: no carotid bruit and no JVD Lungs: diminished breath sounds bibasilar Heart: regular rate and rhythm, S1: normal, S2: decreased intensity and systolic murmur: late systolic 3/6, harsh at 2nd right intercostal space Abdomen: soft, non-tender; bowel sounds normal; no masses,  no organomegaly Extremities: edema trace to 1+ Pulses: 2+ and symmetric Skin: Skin color, texture, turgor normal. No rashes or lesions Neurologic: Mental status: Alert, oriented, thought content appropriate  Lab Results: Results for orders placed or performed during the hospital encounter of 10/12/14 (from the past 48  hour(s))  CBC with Differential/Platelet     Status: Abnormal   Collection Time: 10/12/14 11:45 PM  Result Value Ref Range   WBC 17.7 (H) 4.0 - 10.5 K/uL   RBC 4.01 3.87 - 5.11 MIL/uL   Hemoglobin 11.1 (L) 12.0 - 15.0 g/dL   HCT 35.0 (L) 36.0 - 46.0 %   MCV 87.3 78.0 - 100.0 fL   MCH 27.7 26.0 - 34.0 pg   MCHC 31.7 30.0 - 36.0 g/dL   RDW 15.3 11.5 - 15.5 %   Platelets 376 150 - 400 K/uL   Neutrophils Relative % 78 (H) 43 - 77 %   Neutro Abs 13.9 (H) 1.7 - 7.7 K/uL   Lymphocytes Relative 10 (L) 12 - 46 %   Lymphs Abs 1.8 0.7 - 4.0 K/uL   Monocytes Relative 6 3 - 12 %   Monocytes Absolute 1.1 (H) 0.1 - 1.0 K/uL   Eosinophils Relative 5 0 - 5 %   Eosinophils Absolute 0.8 (H) 0.0 - 0.7 K/uL   Basophils Relative 1 0 - 1 %   Basophils Absolute 0.1 0.0 - 0.1 K/uL  Comprehensive metabolic panel     Status: Abnormal   Collection Time: 10/12/14 11:45 PM  Result Value Ref Range   Sodium 135 135 - 145 mmol/L   Potassium 3.9 3.5 - 5.1 mmol/L   Chloride 93 (L) 96 - 112 mmol/L   CO2 32 19 - 32 mmol/L   Glucose, Bld 199 (H) 70 - 99 mg/dL   BUN 16 6 - 23 mg/dL   Creatinine, Ser 4.40 (H) 0.50 - 1.10 mg/dL   Calcium 8.5 8.4 - 10.5 mg/dL   Total Protein 6.5 6.0 - 8.3 g/dL   Albumin 2.6 (L) 3.5 - 5.2 g/dL   AST 33 0 - 37 U/L   ALT 10 0 - 35 U/L   Alkaline Phosphatase 73 39 - 117 U/L   Total Bilirubin 1.1 0.3 - 1.2 mg/dL   GFR calc non Af Amer 8 (L) >90 mL/min   GFR calc Af Amer 10 (L) >90 mL/min    Comment: (NOTE) The eGFR has been calculated using the CKD EPI equation. This calculation has not been validated in all clinical situations. eGFR's persistently <90 mL/min signify possible Chronic Kidney Disease.    Anion gap 10 5 - 15  Lipase, blood     Status: None   Collection Time: 10/12/14 11:45 PM  Result Value Ref Range   Lipase 27 11 - 59 U/L  Protime-INR     Status: Abnormal  Collection Time: 10/12/14 11:45 PM  Result Value Ref Range   Prothrombin Time 38.1 (H) 11.6 - 15.2  seconds   INR 3.84 (H) 0.00 - 1.49  Magnesium     Status: None   Collection Time: 10/12/14 11:45 PM  Result Value Ref Range   Magnesium 1.9 1.5 - 2.5 mg/dL  Brain natriuretic peptide     Status: Abnormal   Collection Time: 10/12/14 11:45 PM  Result Value Ref Range   B Natriuretic Peptide >4500.0 (H) 0.0 - 100.0 pg/mL  CBG monitoring, ED     Status: Abnormal   Collection Time: 10/12/14 11:50 PM  Result Value Ref Range   Glucose-Capillary 180 (H) 70 - 99 mg/dL  I-stat troponin, ED     Status: None   Collection Time: 10/12/14 11:54 PM  Result Value Ref Range   Troponin i, poc 0.05 0.00 - 0.08 ng/mL   Comment 3            Comment: Due to the release kinetics of cTnI, a negative result within the first hours of the onset of symptoms does not rule out myocardial infarction with certainty. If myocardial infarction is still suspected, repeat the test at appropriate intervals.   I-Stat CG4 Lactic Acid, ED     Status: Abnormal   Collection Time: 10/12/14 11:55 PM  Result Value Ref Range   Lactic Acid, Venous 2.18 (HH) 0.5 - 2.0 mmol/L   Comment NOTIFIED PHYSICIAN   Protime-INR     Status: Abnormal   Collection Time: 10/13/14  4:27 AM  Result Value Ref Range   Prothrombin Time 36.9 (H) 11.6 - 15.2 seconds   INR 3.69 (H) 0.00 - 1.49  Troponin I (q 6hr x 3)     Status: Abnormal   Collection Time: 10/13/14  5:12 AM  Result Value Ref Range   Troponin I 0.08 (H) <0.031 ng/mL    Comment:        PERSISTENTLY INCREASED TROPONIN VALUES IN THE RANGE OF 0.04-0.49 ng/mL CAN BE SEEN IN:       -UNSTABLE ANGINA       -CONGESTIVE HEART FAILURE       -MYOCARDITIS       -CHEST TRAUMA       -ARRYHTHMIAS       -LATE PRESENTING MYOCARDIAL INFARCTION       -COPD   CLINICAL FOLLOW-UP RECOMMENDED.   Comprehensive metabolic panel     Status: Abnormal   Collection Time: 10/13/14  5:12 AM  Result Value Ref Range   Sodium 137 135 - 145 mmol/L   Potassium 3.0 (L) 3.5 - 5.1 mmol/L   Chloride 96 96 -  112 mmol/L   CO2 32 19 - 32 mmol/L   Glucose, Bld 86 70 - 99 mg/dL   BUN 18 6 - 23 mg/dL   Creatinine, Ser 4.66 (H) 0.50 - 1.10 mg/dL   Calcium 8.6 8.4 - 10.5 mg/dL   Total Protein 5.9 (L) 6.0 - 8.3 g/dL   Albumin 2.3 (L) 3.5 - 5.2 g/dL   AST 15 0 - 37 U/L   ALT 9 0 - 35 U/L   Alkaline Phosphatase 67 39 - 117 U/L   Total Bilirubin 0.6 0.3 - 1.2 mg/dL   GFR calc non Af Amer 8 (L) >90 mL/min   GFR calc Af Amer 9 (L) >90 mL/min    Comment: (NOTE) The eGFR has been calculated using the CKD EPI equation. This calculation has not been validated in all clinical  situations. eGFR's persistently <90 mL/min signify possible Chronic Kidney Disease.    Anion gap 9 5 - 15  CBC with Differential/Platelet     Status: Abnormal   Collection Time: 10/13/14  5:12 AM  Result Value Ref Range   WBC 17.5 (H) 4.0 - 10.5 K/uL   RBC 3.68 (L) 3.87 - 5.11 MIL/uL   Hemoglobin 10.1 (L) 12.0 - 15.0 g/dL   HCT 32.0 (L) 36.0 - 46.0 %   MCV 87.0 78.0 - 100.0 fL   MCH 27.4 26.0 - 34.0 pg   MCHC 31.6 30.0 - 36.0 g/dL   RDW 15.4 11.5 - 15.5 %   Platelets 363 150 - 400 K/uL   Neutrophils Relative % 70 43 - 77 %   Lymphocytes Relative 15 12 - 46 %   Monocytes Relative 8 3 - 12 %   Eosinophils Relative 6 (H) 0 - 5 %   Basophils Relative 1 0 - 1 %   Neutro Abs 12.2 (H) 1.7 - 7.7 K/uL   Lymphs Abs 2.6 0.7 - 4.0 K/uL   Monocytes Absolute 1.4 (H) 0.1 - 1.0 K/uL   Eosinophils Absolute 1.1 (H) 0.0 - 0.7 K/uL   Basophils Absolute 0.2 (H) 0.0 - 0.1 K/uL   RBC Morphology POLYCHROMASIA PRESENT     Comment: ELLIPTOCYTES  Procalcitonin - Baseline     Status: None   Collection Time: 10/13/14  5:12 AM  Result Value Ref Range   Procalcitonin 2.54 ng/mL    Comment:        Interpretation: PCT > 2 ng/mL: Systemic infection (sepsis) is likely, unless other causes are known. (NOTE)         ICU PCT Algorithm               Non ICU PCT Algorithm    ----------------------------     ------------------------------         PCT  < 0.25 ng/mL                 PCT < 0.1 ng/mL     Stopping of antibiotics            Stopping of antibiotics       strongly encouraged.               strongly encouraged.    ----------------------------     ------------------------------       PCT level decrease by               PCT < 0.25 ng/mL       >= 80% from peak PCT       OR PCT 0.25 - 0.5 ng/mL          Stopping of antibiotics                                             encouraged.     Stopping of antibiotics           encouraged.    ----------------------------     ------------------------------       PCT level decrease by              PCT >= 0.25 ng/mL       < 80% from peak PCT        AND PCT >= 0.5 ng/mL  Continuing antibiotics                                               encouraged.       Continuing antibiotics            encouraged.    ----------------------------     ------------------------------     PCT level increase compared          PCT > 0.5 ng/mL         with peak PCT AND          PCT >= 0.5 ng/mL             Escalation of antibiotics                                          strongly encouraged.      Escalation of antibiotics        strongly encouraged.   Lactic acid, plasma     Status: None   Collection Time: 10/13/14  7:02 AM  Result Value Ref Range   Lactic Acid, Venous 1.0 0.5 - 2.0 mmol/L  Culture, blood (routine x 2)     Status: None (Preliminary result)   Collection Time: 10/13/14  7:02 AM  Result Value Ref Range   Specimen Description BLOOD RIGHT WRIST    Special Requests BOTTLES DRAWN AEROBIC AND ANAEROBIC 10CC    Culture             BLOOD CULTURE RECEIVED NO GROWTH TO DATE CULTURE WILL BE HELD FOR 5 DAYS BEFORE ISSUING A FINAL NEGATIVE REPORT Note: Culture results may be compromised due to an excessive volume of blood received in culture bottles. Performed at [facility]    Report Status PENDING   Hemoglobin A1c     Status: Abnormal   Collection Time: 10/13/14  7:02 AM    Result Value Ref Range   Hgb A1c MFr Bld 5.8 (H) 4.8 - 5.6 %    Comment: (NOTE)         Pre-diabetes: 5.7 - 6.4         Diabetes: >6.4         Glycemic control for adults with diabetes: <7.0    Mean Plasma Glucose 120 mg/dL    Comment: (NOTE) Performed At: [facility] [place], [place] [id number] Lindon Romp MD [id number]   Culture, blood (routine x 2)     Status: None (Preliminary result)   Collection Time: 10/13/14  7:15 AM  Result Value Ref Range   Specimen Description BLOOD RIGHT HAND    Special Requests BOTTLES DRAWN AEROBIC AND ANAEROBIC 10CC    Culture             BLOOD CULTURE RECEIVED NO GROWTH TO DATE CULTURE WILL BE HELD FOR 5 DAYS BEFORE ISSUING A FINAL NEGATIVE REPORT Note: Culture results may be compromised due to an excessive volume of blood received in culture bottles. Performed at [facility]    Report Status PENDING   Troponin I (q 6hr x 3)     Status: Abnormal   Collection Time: 10/13/14 10:12 AM  Result Value Ref Range   Troponin I 0.06 (H) <0.031 ng/mL    Comment:  PERSISTENTLY INCREASED TROPONIN VALUES IN THE RANGE OF 0.04-0.49 ng/mL CAN BE SEEN IN:       -UNSTABLE ANGINA       -CONGESTIVE HEART FAILURE       -MYOCARDITIS       -CHEST TRAUMA       -ARRYHTHMIAS       -LATE PRESENTING MYOCARDIAL INFARCTION       -COPD   CLINICAL FOLLOW-UP RECOMMENDED.   Phosphorus     Status: None   Collection Time: 10/13/14 10:51 AM  Result Value Ref Range   Phosphorus 2.6 2.3 - 4.6 mg/dL  Troponin I (q 6hr x 3)     Status: Abnormal   Collection Time: 10/13/14  4:55 PM  Result Value Ref Range   Troponin I 0.06 (H) <0.031 ng/mL    Comment:        PERSISTENTLY INCREASED TROPONIN VALUES IN THE RANGE OF 0.04-0.49 ng/mL CAN BE SEEN IN:       -UNSTABLE ANGINA       -CONGESTIVE HEART FAILURE       -MYOCARDITIS       -CHEST TRAUMA       -ARRYHTHMIAS       -LATE PRESENTING MYOCARDIAL INFARCTION       -COPD    CLINICAL FOLLOW-UP RECOMMENDED.   Protime-INR     Status: Abnormal   Collection Time: 10/14/14  8:38 AM  Result Value Ref Range   Prothrombin Time 39.2 (H) 11.6 - 15.2 seconds   INR 3.99 (H) 0.00 - 1.49  CBC     Status: Abnormal   Collection Time: 10/14/14  8:38 AM  Result Value Ref Range   WBC 17.6 (H) 4.0 - 10.5 K/uL   RBC 3.57 (L) 3.87 - 5.11 MIL/uL   Hemoglobin 9.8 (L) 12.0 - 15.0 g/dL   HCT 30.8 (L) 36.0 - 46.0 %   MCV 86.3 78.0 - 100.0 fL   MCH 27.5 26.0 - 34.0 pg   MCHC 31.8 30.0 - 36.0 g/dL   RDW 15.4 11.5 - 15.5 %   Platelets 371 150 - 400 K/uL  Basic metabolic panel     Status: Abnormal   Collection Time: 10/14/14  9:00 AM  Result Value Ref Range   Sodium 140 135 - 145 mmol/L   Potassium 4.1 3.5 - 5.1 mmol/L   Chloride 100 96 - 112 mmol/L   CO2 32 19 - 32 mmol/L   Glucose, Bld 97 70 - 99 mg/dL   BUN <5 (L) 6 - 23 mg/dL    Comment: RESULT REPEATED AND VERIFIED RESULTS VERIFIED VIA RECOLLECT    Creatinine, Ser 0.52 0.50 - 1.10 mg/dL    Comment: RESULTS VERIFIED VIA RECOLLECT QUESTIONABLE RESULTS, RECOMMEND RECOLLECT TO VERIFY B.ZHAO,RN REQUESTED RESULTS BE REPORTED    Calcium 8.2 (L) 8.4 - 10.5 mg/dL   GFR calc non Af Amer 85 (L) >90 mL/min   GFR calc Af Amer >90 >90 mL/min    Comment: (NOTE) The eGFR has been calculated using the CKD EPI equation. This calculation has not been validated in all clinical situations. eGFR's persistently <90 mL/min signify possible Chronic Kidney Disease.    Anion gap 8 5 - 15    Imaging: Dg Chest 2 View  10/13/2014   CLINICAL DATA:  Shortness of breath and chest discomfort  EXAM: CHEST  2 VIEW  COMPARISON:  05/07/2013  FINDINGS: Chronic cardiopericardial enlargement and aortic tortuosity. Extensive coronary atherosclerosis. There is no edema, consolidation, effusion, or pneumothorax. Remote  upper thoracic compression fracture with height loss stable from 2014.  IMPRESSION: Cardiomegaly without failure.   Electronically Signed    By: Monte Fantasia [name]   On: 10/13/2014 00:24   Ct Chest Wo Contrast  10/13/2014   CLINICAL DATA:  Weakness and shortness of breath  EXAM: CT CHEST WITHOUT CONTRAST  TECHNIQUE: Multidetector CT imaging of the chest was performed following the standard protocol without IV contrast.  COMPARISON:  None.  FINDINGS: THORACIC INLET/BODY WALL:  No acute abnormality.  MEDIASTINUM:  There is chronic cardiomegaly with multi chamber enlargement. Bulky aortic valvular calcifications/ sclerosis. There is extensive aortic and mitral annular calcification as well. Diffuse atherosclerotic calcification, throughout both the left and right coronary circulation. No acute vascular findings. A low-density 14 mm ovoid nodule just above the aortic hiatus is the cisterna chyli.  LUNG WINDOWS:  There is no pneumonia. Minimal interlobular septal and fissural thickening. No pleural effusion or alveolar edema.  UPPER ABDOMEN:  Severe renal atrophy. There is a partially visualized presumed 4 cm cyst in the upper pole left kidney.  OSSEOUS:  Remote T4 and T6 compression fractures. Height loss is greater at T6, near 75%.  IMPRESSION: 1. Minimal interstitial pulmonary edema. 2. Extensive aortic valvular calcifications/sclerosis.   Electronically Signed   By: Monte Fantasia [name]   On: 10/13/2014 01:39    Assessment:  1. Principal Problem: 2.   Acute respiratory failure with hypoxia 3. Active Problems: 4.   ESRD (end stage renal disease) on dialysis 5.   CAD- RCA HSRA/DES 05/15/13-  CFX/OM1 DES 05/19/13 6.   Dyslipidemia 7.   Atrial fibrillation with rapid ventricular response 8.   Aortic stenosis 9.   Plan:  Acute respiratory failure secondary to pulmonary edema - normal LV systolic function, however, severe diastolic dysfunction (Grade 3) on recent echo.  Family says she hasn't been eating much, but her dietary choices may be contributing to diastolic heart failure. Volume management will be difficult given her severe aortic  stenosis. She has an appointment to see Dr. Angelena Form in the valve clinic on Tuesday. Would recommend that we keep with that plan as she appears to be doing better. She is currently back in normal sinus rhythm - which is probably helping the situation. She was on amiodarone 200 mg daily at home - would increase to 400 mg daily. She is also currently on ASA, Plavix and Warfarin. She has had numerous high risk interventions - hesitant to change this without reviewing with Dr. Gwenlyn Found.  Time Spent Directly with Patient:  15 minutes  Length of Stay:  LOS: 1 day   Pixie Casino, MD, [facility] Attending Cardiologist [facility]  Faren Florence C 10/14/2014, 1:16 PM

## 2014-10-15 DIAGNOSIS — E785 Hyperlipidemia, unspecified: Secondary | ICD-10-CM

## 2014-10-15 DIAGNOSIS — I350 Nonrheumatic aortic (valve) stenosis: Secondary | ICD-10-CM

## 2014-10-15 LAB — BASIC METABOLIC PANEL
ANION GAP: 13 (ref 5–15)
BUN: 13 mg/dL (ref 6–23)
CALCIUM: 8.5 mg/dL (ref 8.4–10.5)
CO2: 25 mmol/L (ref 19–32)
Chloride: 96 mmol/L (ref 96–112)
Creatinine, Ser: 3.89 mg/dL — ABNORMAL HIGH (ref 0.50–1.10)
GFR calc non Af Amer: 10 mL/min — ABNORMAL LOW (ref >90)
GFR, EST AFRICAN AMERICAN: 11 mL/min — AB (ref >90)
Glucose, Bld: 88 mg/dL (ref 70–99)
Potassium: 4.4 mmol/L (ref 3.5–5.1)
Sodium: 134 mmol/L — ABNORMAL LOW (ref 135–145)

## 2014-10-15 LAB — CBC
HCT: 30.8 % — ABNORMAL LOW (ref 36.0–46.0)
Hemoglobin: 9.7 g/dL — ABNORMAL LOW (ref 12.0–15.0)
MCH: 27.2 pg (ref 26.0–34.0)
MCHC: 31.5 g/dL (ref 30.0–36.0)
MCV: 86.3 fL (ref 78.0–100.0)
Platelets: 371 10*3/uL (ref 150–400)
RBC: 3.57 MIL/uL — ABNORMAL LOW (ref 3.87–5.11)
RDW: 15.3 % (ref 11.5–15.5)
WBC: 15.4 10*3/uL — AB (ref 4.0–10.5)

## 2014-10-15 LAB — PROTIME-INR
INR: 3.68 — ABNORMAL HIGH (ref 0.00–1.49)
Prothrombin Time: 36.8 seconds — ABNORMAL HIGH (ref 11.6–15.2)

## 2014-10-15 LAB — PROCALCITONIN: PROCALCITONIN: 2.1 ng/mL

## 2014-10-15 MED ORDER — AMIODARONE HCL 200 MG PO TABS: 400.0000 mg | ORAL_TABLET | Freq: Every day | ORAL | Status: DC

## 2014-10-15 MED ORDER — LEVOFLOXACIN 500 MG PO TABS: 500.0000 mg | ORAL_TABLET | ORAL | Status: AC

## 2014-10-15 MED ORDER — LEVOFLOXACIN 750 MG PO TABS
750.0000 mg | ORAL_TABLET | Freq: Once | ORAL | Status: AC
  Administered 2014-10-15: 750 mg via ORAL
  Filled 2014-10-15: qty 1

## 2014-10-15 NOTE — Progress Notes (Signed)
S: Feels well.  No SOB O:BP 111/48 mmHg  Pulse 70  Temp(Src) 98 F (36.7 C) (Oral)  Resp 18  Ht 5\' 4"  (1.626 m)  Wt 51.6 kg (113 lb 12.1 oz)  BMI 19.52 kg/m2  SpO2 96%  Intake/Output Summary (Last 24 hours) at 10/15/14 0952 Last data filed at 10/15/14 0834  Gross per 24 hour  Intake    580 ml  Output   2850 ml  Net  -2270 ml   Weight change: 0.421 kg (14.9 oz) AOZ:HYQMVGen:awake and alert CVS: irreg, irreg Resp: clear Abd:+ BS NTND Ext: No edema, LUA AVF + bruit NEURO:CNI  OX# no asterixis   . amiodarone  400 mg Oral Daily  . aspirin EC  81 mg Oral Daily  . atorvastatin  40 mg Oral Daily  . ceFEPime (MAXIPIME) IV  2 g Intravenous Q M,W,F-HD  . clopidogrel  75 mg Oral Daily  . Darbepoetin Alfa  40 mcg Intravenous Q Fri-HD  . doxercalciferol  2.5 mcg Intravenous Q M,W,F-HD  . folic acid  1 mg Oral Daily  . metoprolol tartrate  25 mg Oral BID  . multivitamin  1 tablet Oral Daily  . sevelamer carbonate  1,600 mg Oral TID WC  . sodium chloride  3 mL Intravenous Q12H  . vancomycin  500 mg Intravenous Q M,W,F-HD  . Warfarin - Pharmacist Dosing Inpatient   Does not apply q1800   No results found. BMET    Component Value Date/Time   NA 134* 10/15/2014 0505   K 4.4 10/15/2014 0505   CL 96 10/15/2014 0505   CO2 25 10/15/2014 0505   GLUCOSE 88 10/15/2014 0505   BUN 13 10/15/2014 0505   CREATININE 3.89* 10/15/2014 0505   CALCIUM 8.5 10/15/2014 0505   CALCIUM 8.3* 02/26/2011 1011   GFRNONAA 10* 10/15/2014 0505   GFRAA 11* 10/15/2014 0505   CBC    Component Value Date/Time   WBC 15.4* 10/15/2014 0505   RBC 3.57* 10/15/2014 0505   RBC 3.44* 05/10/2013 0910   HGB 9.7* 10/15/2014 0505   HCT 30.8* 10/15/2014 0505   PLT 371 10/15/2014 0505   MCV 86.3 10/15/2014 0505   MCH 27.2 10/15/2014 0505   MCHC 31.5 10/15/2014 0505   RDW 15.3 10/15/2014 0505   LYMPHSABS 2.6 10/13/2014 0512   MONOABS 1.4* 10/13/2014 0512   EOSABS 1.1* 10/13/2014 0512   BASOSABS 0.2* 10/13/2014 0512      Assessment: 1. ESRD 2. Severe AS 3. A Fib 4. Anemia on aranesp 5. Sec HPTH on hectorol  Plan: 1.  Note plans for DC.  I will call her dialysis unit and rec longer HD time for now and DW 51kg as that is what she weighed yest after HD and again today.   Ceola Para T

## 2014-10-15 NOTE — Discharge Summary (Signed)
Physician Discharge Summary  [name] NFA:[id number] DOB: 03-Feb-1928 DOA: 10/12/2014  PCP: [name], MD  Admit date: 10/12/2014 Discharge date: 10/15/2014  Time spent: 35 minutes  Recommendations for Outpatient Follow-up:  1. Discharge home with outpatient follow-up with cardiology on 10/18/2014 to discuss about TAVR 2. Follow-up with scheduled hemodialysis. Nephrology consult recommends lumbar hemodialysis time for now to keep dry weight at 51 Kg. 3. Patient will complete Levaquin on 4/13.  Discharge Diagnoses:  Principal Problem:   Acute respiratory failure with hypoxia  Active Problems:   ESRD (end stage renal disease) on dialysis   CAD- RCA HSRA/DES 05/15/13-  CFX/OM1 DES 05/19/13   Dyslipidemia   Atrial fibrillation with rapid ventricular response   Aortic stenosis   HCAP (healthcare-associated pneumonia)   Severe aortic stenosis   Discharge Condition: Fair  Diet recommendation: Cardiac/renal  Filed Weights   10/14/14 0718 10/14/14 1118 10/15/14 0504  Weight: 53.9 kg (118 lb 13.3 oz) 51 kg (112 lb 7 oz) 51.6 kg (113 lb 12.1 oz)    History of present illness:  Please refer to admission H&P for details, [name], 81 y/o female with ESRD on HD ( MWF), recent echo with severe AS, CAD with history of stent, chronic diastolic CHF, A. fib on Coumadin and amiodarone, chronic anemia, presented to the ED with acute onset of shortness of breath. She reports having off-and-on shortness of breath for the past 2 weeks which became worse last evening. He also reports nonproductive cough for the past 2-3 days without any fevers or chills. In the ED patient was found to be in rapid A. fib. Chest CT without contrast done showed pulmonary edema. Patient admitted to telemetry.  Hospital Course:  Acute hypoxic respiratory failure Likely a combination of volume overload with CHF worsened by rapid A. fib and healthcare associated pneumonia. Patient had leukocytosis and elevated lactate on  admission. Symptoms worsened with severe aortic stenosis. Patient seen by cardiology and renal. Hemodialysis done on 4/8 with marked improvement in her dyspnea. She has a scheduled appointment to see cardiologist Dr. Excell Seltzer who will discuss with her regarding her severe aortic stenosis and TAVR.   A. fib with RVR Heart rate elevated. amiodarone dose increased to 40 mg once a day. We'll continue home dose metoprolol as blood pressure is soft. She knew Coumadin. INR is supratherapeutic which further may worsen with use of Levaquin.. Recommend not to take Coumadin today and resume from tomorrow..  Patient on aspirin and Plavix as well which we increase her bleeding risk along with Coumadin. Will defer to her primary cardiologist regarding this.  ? HCAP with early sepsis Patient presented with cough and significant leukocytosis associated with shortness of breath. Placed on empiric vancomycin and cefepime. Cultures negative. Symptoms now improved and patient remains afebrile. She will be discharged on Levaquin (750 mg now followed by 500 mg after hemodialysis on 4/11 and 4/13)  Elevated troponin Mild. Likely demand ischemia. Asymptomatic.  End-stage renal disease on dialysis  HD done 4/8 with improvement in dyspnea. Nephrology recommends increasing her hemodialysis time to keep her dry weight at 51 kg. Continue weekly Aranesp injection. hectoral with dialysis. Continue renvela.    Hypokalemia Replenished    Diet: Heart healthy/renal  Code Status: Full code Family Communication: Daughter at bedside Disposition Plan: Discharge home with outpatient follow-up   Consultants:  Cardiology  renal  Procedures:  None  Antibiotics:  Vancomycin and cefepime 4/7-49  Discharge on oral Levaquin, received 750 mg one dose prior to discharge  today followed by 500 mg after HD on 4/11 and 4/13   Discharge Exam: Filed Vitals:   10/15/14 1007  BP: 91/43  Pulse: 75  Temp:   Resp:      General: Elderly female in no acute distress  HEENT: No pallor, moist oral mucosa, no JVD,  Cardiovascular: S1 and S2 irregularly irregular, systolic murmur 3/6  Respiratory: clear b/l, no rhonchi or wheeze  Abdomen: Soft, nondistended, nontender, bowel sounds present  Musculoskeletal: Warm, no edema  CNS: Alert and oriented   Discharge Instructions    Current Discharge Medication List    START taking these medications   Details  levofloxacin (LEVAQUIN) 500 MG tablet Take 1 tablet (500 mg total) by mouth every Monday, Wednesday, and Friday with hemodialysis. Qty: 2 tablet, Refills: 0      CONTINUE these medications which have CHANGED   Details  amiodarone (PACERONE) 200 MG tablet Take 2 tablets (400 mg total) by mouth daily. Qty: 30 tablet, Refills: 5      CONTINUE these medications which have NOT CHANGED   Details  aspirin 81 MG EC tablet Take 81 mg by mouth daily.    atorvastatin (LIPITOR) 40 MG tablet Take 40 mg by mouth daily.      clopidogrel (PLAVIX) 75 MG tablet TAKE ONE (1) TABLET EACH DAY Qty: 30 tablet, Refills: 11    darbepoetin (ARANESP) 100 MCG/0.5ML SOLN injection Inject 100 mcg into the skin See admin instructions. Takes every Monday, Wednesday and Friday with dialysis    doxercalciferol (HECTOROL) 4 MCG/2ML injection Inject 3.5 mcg into the vein every Monday, Wednesday, and Friday with hemodialysis.    folic acid (FOLVITE) 1 MG tablet Take 1 mg by mouth daily.    metoprolol tartrate (LOPRESSOR) 25 MG tablet TAKE ONE TABLET BY MOUTH TWICE A DAY Qty: 60 tablet, Refills: 5    multivitamin (RENA-VIT) TABS tablet Take 1 tablet by mouth daily.    sevelamer carbonate (RENVELA) 800 MG tablet Take 1,600 mg by mouth 2 (two) times daily with a meal.     sodium bicarbonate 325 MG tablet Take 325 mg by mouth 2 (two) times daily.    warfarin (COUMADIN) 3 MG tablet Take 1.5-3 mg by mouth daily. Takes 3 mg on Sun, Tue, Wed, Thu, and Sat. Takes 1.5mg  on  Mon & Fri.       No Known Allergies Follow-up Information    Follow up with HILL,GERALD K, MD In 1 week.   Specialty:  Family Medicine   Contact information:   [address] [place] [address] [phone]       Follow up with Tonny Bollman, MD On 10/18/2014.   Specialty:  Cardiology   Contact information:   [address] [place] [address] [phone]        The results of significant diagnostics from this hospitalization (including imaging, microbiology, ancillary and laboratory) are listed below for reference.    Significant Diagnostic Studies: Dg Chest 2 View  10/13/2014   CLINICAL DATA:  Shortness of breath and chest discomfort  EXAM: CHEST  2 VIEW  COMPARISON:  05/07/2013  FINDINGS: Chronic cardiopericardial enlargement and aortic tortuosity. Extensive coronary atherosclerosis. There is no edema, consolidation, effusion, or pneumothorax. Remote upper thoracic compression fracture with height loss stable from 2014.  IMPRESSION: Cardiomegaly without failure.   Electronically Signed   By: Marnee Spring [name]   On: 10/13/2014 00:24   Ct Chest Wo Contrast  10/13/2014   CLINICAL  DATA:  Weakness and shortness of breath  EXAM: CT CHEST WITHOUT CONTRAST  TECHNIQUE: Multidetector CT imaging of the chest was performed following the standard protocol without IV contrast.  COMPARISON:  None.  FINDINGS: THORACIC INLET/BODY WALL:  No acute abnormality.  MEDIASTINUM:  There is chronic cardiomegaly with multi chamber enlargement. Bulky aortic valvular calcifications/ sclerosis. There is extensive aortic and mitral annular calcification as well. Diffuse atherosclerotic calcification, throughout both the left and right coronary circulation. No acute vascular findings. A low-density 14 mm ovoid nodule just above the aortic hiatus is the cisterna chyli.  LUNG WINDOWS:  There is no pneumonia. Minimal interlobular septal and fissural thickening. No pleural effusion or  alveolar edema.  UPPER ABDOMEN:  Severe renal atrophy. There is a partially visualized presumed 4 cm cyst in the upper pole left kidney.  OSSEOUS:  Remote T4 and T6 compression fractures. Height loss is greater at T6, near 75%.  IMPRESSION: 1. Minimal interstitial pulmonary edema. 2. Extensive aortic valvular calcifications/sclerosis.   Electronically Signed   By: Marnee Spring [name]   On: 10/13/2014 01:39    Microbiology: Recent Results (from the past 240 hour(s))  Culture, blood (routine x 2)     Status: None (Preliminary result)   Collection Time: 10/13/14  7:02 AM  Result Value Ref Range Status   Specimen Description BLOOD RIGHT WRIST  Final   Special Requests BOTTLES DRAWN AEROBIC AND ANAEROBIC 10CC  Final   Culture   Final           BLOOD CULTURE RECEIVED NO GROWTH TO DATE CULTURE WILL BE HELD FOR 5 DAYS BEFORE ISSUING A FINAL NEGATIVE REPORT Note: Culture results may be compromised due to an excessive volume of blood received in culture bottles. Performed at [facility]    Report Status PENDING  Incomplete  Culture, blood (routine x 2)     Status: None (Preliminary result)   Collection Time: 10/13/14  7:15 AM  Result Value Ref Range Status   Specimen Description BLOOD RIGHT HAND  Final   Special Requests BOTTLES DRAWN AEROBIC AND ANAEROBIC 10CC  Final   Culture   Final           BLOOD CULTURE RECEIVED NO GROWTH TO DATE CULTURE WILL BE HELD FOR 5 DAYS BEFORE ISSUING A FINAL NEGATIVE REPORT Note: Culture results may be compromised due to an excessive volume of blood received in culture bottles. Performed at [facility]    Report Status PENDING  Incomplete     Labs: Basic Metabolic Panel:  Recent Labs Lab 10/12/14 2345 10/13/14 0512 10/13/14 1051 10/14/14 0900 10/15/14 0505  NA 135 137  --  140 134*  K 3.9 3.0*  --  4.1 4.4  CL 93* 96  --  100 96  CO2 32 32  --  32 25  GLUCOSE 199* 86  --  97 88  BUN 16 18  --  <5* 13  CREATININE 4.40* 4.66*  --   0.52 3.89*  CALCIUM 8.5 8.6  --  8.2* 8.5  MG 1.9  --   --   --   --   PHOS  --   --  2.6  --   --    Liver Function Tests:  Recent Labs Lab 10/12/14 2345 10/13/14 0512  AST 33 15  ALT 10 9  ALKPHOS 73 67  BILITOT 1.1 0.6  PROT 6.5 5.9*  ALBUMIN 2.6* 2.3*    Recent Labs Lab 10/12/14 2345  LIPASE  27   No results for input(s): AMMONIA in the last 168 hours. CBC:  Recent Labs Lab 10/12/14 2345 10/13/14 0512 10/14/14 0838 10/15/14 0505  WBC 17.7* 17.5* 17.6* 15.4*  NEUTROABS 13.9* 12.2*  --   --   HGB 11.1* 10.1* 9.8* 9.7*  HCT 35.0* 32.0* 30.8* 30.8*  MCV 87.3 87.0 86.3 86.3  PLT 376 363 371 371   Cardiac Enzymes:  Recent Labs Lab 10/13/14 0512 10/13/14 1012 10/13/14 1655  TROPONINI 0.08* 0.06* 0.06*   BNP: BNP (last 3 results)  Recent Labs  10/12/14 2345  BNP >4500.0*    ProBNP (last 3 results) No results for input(s): PROBNP in the last 8760 hours.  CBG:  Recent Labs Lab 10/12/14 2350  GLUCAP 180*       Signed:  Pasty Manninen  [facility] 10/15/2014, 11:53 AM

## 2014-10-15 NOTE — Progress Notes (Signed)
ANTICOAGULATION CONSULT NOTE - Follow Up Consult  Pharmacy Consult for warfarin Indication: atrial fibrillation  No Known Allergies  Patient Measurements: Height: 5\' 4"  (162.6 cm) Weight: 113 lb 12.1 oz (51.6 kg) IBW/kg (Calculated) : 54.7  Vital Signs: Temp: 98 F (36.7 C) (04/09 0504) Temp Source: Oral (04/09 0504) BP: 91/43 mmHg (04/09 1007) Pulse Rate: 75 (04/09 1007)  Labs:  Recent Labs  10/13/14 0427 10/13/14 0512 10/13/14 1012 10/13/14 1655 10/14/14 0838 10/14/14 0900 10/15/14 0505  HGB  --  10.1*  --   --  9.8*  --  9.7*  HCT  --  32.0*  --   --  30.8*  --  30.8*  PLT  --  363  --   --  371  --  371  LABPROT 36.9*  --   --   --  39.2*  --  36.8*  INR 3.69*  --   --   --  3.99*  --  3.68*  CREATININE  --  4.66*  --   --   --  0.52 3.89*  TROPONINI  --  0.08* 0.06* 0.06*  --   --   --     Estimated Creatinine Clearance: 8.8 mL/min (by C-G formula based on Cr of 3.89).  Assessment: 81 y/o female with ESRD on HD MWF admitted with weakness and SOB. She takes chronic warfarin for Afib and INR was supratherapeutic on admit.  INR remains supratherapeutic this morning at 3.68. No bleeding noted, Hb has trended down to 9.7, platelets are stable.  PTA: 3 mg daily except 1.5 mg Mon/Fri, last dose 4/6 at home  Goal of Therapy:  INR 2-3 Monitor platelets by anticoagulation protocol: Yes   Plan:  - No warfarin today - INR daily - Monitor for s/sx of bleeding  [facility] Healthwest-New OrleansJennifer , RockwoodPharm.D., BCPS Clinical Pharmacist Pager: [phone] 10/15/2014 10:12 AM

## 2014-10-15 NOTE — Progress Notes (Signed)
10/15/2014 12:30 PM D/c avs form, medications already taken today and those due this evening given and explained to patient. Follow up appointments and when to call MD reviewed. RX reviewed. D/c iv. D/c tele. D/c home per orders.

## 2014-10-15 NOTE — Progress Notes (Signed)
Patient Name: Brittany BadgerLottie V Beard Date of Encounter: 10/15/2014  Principal Problem:   Acute respiratory failure with hypoxia Active Problems:   ESRD (end stage renal disease) on dialysis   CAD- RCA HSRA/DES 05/15/13-  CFX/OM1 DES 05/19/13   Dyslipidemia   Atrial fibrillation with rapid ventricular response   Aortic stenosis   HCAP (healthcare-associated pneumonia)   Persistent atrial fibrillation   Length of Stay: 2  SUBJECTIVE  The patient feels better today, normal sats, however haven't walked today yet. NO chest pain or SOB at rest.   CURRENT MEDS . amiodarone  400 mg Oral Daily  . aspirin EC  81 mg Oral Daily  . atorvastatin  40 mg Oral Daily  . ceFEPime (MAXIPIME) IV  2 g Intravenous Q M,W,F-HD  . clopidogrel  75 mg Oral Daily  . Darbepoetin Alfa  40 mcg Intravenous Q Fri-HD  . doxercalciferol  2.5 mcg Intravenous Q M,W,F-HD  . folic acid  1 mg Oral Daily  . metoprolol tartrate  25 mg Oral BID  . multivitamin  1 tablet Oral Daily  . sevelamer carbonate  1,600 mg Oral TID WC  . sodium chloride  3 mL Intravenous Q12H  . vancomycin  500 mg Intravenous Q M,W,F-HD  . Warfarin - Pharmacist Dosing Inpatient   Does not apply q1800    OBJECTIVE  Filed Vitals:   10/14/14 1118 10/14/14 1400 10/14/14 2131 10/15/14 0504  BP: 118/52 113/53 113/52 111/48  Pulse: 68 72 79 70  Temp: 97 F (36.1 C) 97.1 F (36.2 C) 98.7 F (37.1 C) 98 F (36.7 C)  TempSrc: Oral Oral Oral Oral  Resp: 24 17 18 18   Height:      Weight: 112 lb 7 oz (51 kg)   113 lb 12.1 oz (51.6 kg)  SpO2: 95% 98% 95% 96%    Intake/Output Summary (Last 24 hours) at 10/15/14 0847 Last data filed at 10/15/14 0834  Gross per 24 hour  Intake    580 ml  Output   2850 ml  Net  -2270 ml   Filed Weights   10/14/14 0718 10/14/14 1118 10/15/14 0504  Weight: 118 lb 13.3 oz (53.9 kg) 112 lb 7 oz (51 kg) 113 lb 12.1 oz (51.6 kg)    PHYSICAL EXAM  General: Pleasant, NAD. Neuro: Alert and oriented X 3. Moves all  extremities spontaneously. Psych: Normal affect. HEENT:  Normal  Neck: Supple without bruits or JVD. Lungs:  Resp regular and unlabored, minimal rales at the bases. Heart: RRR no s3, s4, 5/6 holosystolic murmur, no S2. Abdomen: Soft, non-tender, non-distended, BS + x 4.  Extremities: No clubbing, cyanosis or edema. DP/PT/Radials 2+ and equal bilaterally.  Accessory Clinical Findings  CBC  Recent Labs  10/12/14 2345 10/13/14 0512 10/14/14 0838 10/15/14 0505  WBC 17.7* 17.5* 17.6* 15.4*  NEUTROABS 13.9* 12.2*  --   --   HGB 11.1* 10.1* 9.8* 9.7*  HCT 35.0* 32.0* 30.8* 30.8*  MCV 87.3 87.0 86.3 86.3  PLT 376 363 371 371   Basic Metabolic Panel  Recent Labs  10/12/14 2345  10/13/14 1051 10/14/14 0900 10/15/14 0505  NA 135  < >  --  140 134*  K 3.9  < >  --  4.1 4.4  CL 93*  < >  --  100 96  CO2 32  < >  --  32 25  GLUCOSE 199*  < >  --  97 88  BUN 16  < >  --  <  5* 13  CREATININE 4.40*  < >  --  0.52 3.89*  CALCIUM 8.5  < >  --  8.2* 8.5  MG 1.9  --   --   --   --   PHOS  --   --  2.6  --   --   < > = values in this interval not displayed. Liver Function Tests  Recent Labs  10/12/14 2345 10/13/14 0512  AST 33 15  ALT 10 9  ALKPHOS 73 67  BILITOT 1.1 0.6  PROT 6.5 5.9*  ALBUMIN 2.6* 2.3*    Recent Labs  10/12/14 2345  LIPASE 27   Cardiac Enzymes  Recent Labs  10/13/14 0512 10/13/14 1012 10/13/14 1655  TROPONINI 0.08* 0.06* 0.06*   Hemoglobin A1C  Recent Labs  10/13/14 0702  HGBA1C 5.8*   Radiology/Studies  Dg Chest 2 View  10/13/2014   CLINICAL DATA:  Shortness of breath and chest discomfort  EXAM: CHEST  2 VIEW  COMPARISON:  05/07/2013  FINDINGS: Chronic cardiopericardial enlargement and aortic tortuosity. Extensive coronary atherosclerosis. There is no edema, consolidation, effusion, or pneumothorax. Remote upper thoracic compression fracture with height loss stable from 2014.  IMPRESSION: Cardiomegaly without failure.   Electronically  Signed   By: Marnee Spring [name]   On: 10/13/2014 00:24   Ct Chest Wo Contrast  10/13/2014   CLINICAL DATA:  Weakness and shortness of breath  EXAM: CT CHEST WITHOUT CONTRAST  TECHNIQUE: Multidetector CT imaging of the chest was performed following the standard protocol without IV contrast.  COMPARISON:  None.  FINDINGS: THORACIC INLET/BODY WALL:  No acute abnormality.  MEDIASTINUM:  There is chronic cardiomegaly with multi chamber enlargement. Bulky aortic valvular calcifications/ sclerosis. There is extensive aortic and mitral annular calcification as well. Diffuse atherosclerotic calcification, throughout both the left and right coronary circulation. No acute vascular findings. A low-density 14 mm ovoid nodule just above the aortic hiatus is the cisterna chyli.  LUNG WINDOWS:  There is no pneumonia. Minimal interlobular septal and fissural thickening. No pleural effusion or alveolar edema.  UPPER ABDOMEN:  Severe renal atrophy. There is a partially visualized presumed 4 cm cyst in the upper pole left kidney.  OSSEOUS:  Remote T4 and T6 compression fractures. Height loss is greater at T6, near 75%.  IMPRESSION: 1. Minimal interstitial pulmonary edema. 2. Extensive aortic valvular calcifications/sclerosis.   Electronically Signed   By: Marnee Spring [name]   On: 10/13/2014 01:39   TELE: SR   ASSESSMENT AND PLAN  Principal Problem:   Acute respiratory failure with hypoxia Active Problems:   ESRD (end stage renal disease) on dialysis   CAD- RCA HSRA/DES 05/15/13-  CFX/OM1 DES 05/19/13   Dyslipidemia   Atrial fibrillation with rapid ventricular response   Aortic stenosis   HCAP (healthcare-associated pneumonia)   Persistent atrial fibrillation   Plan:  Acute respiratory failure secondary to pulmonary edema - normal LV systolic function, however, severe diastolic dysfunction (Grade 3) on recent echo.  Family says she hasn't been eating much, but her dietary choices may be contributing to  diastolic heart failure. Volume management will be difficult given her severe aortic stenosis. She has an appointment to see Dr. Excell Seltzer in the valve clinic on Tuesday. Would recommend that we keep with that plan as she appears to be doing better. She is currently back in normal sinus rhythm - which is probably helping the situation. She was on amiodarone 200 mg daily at home - increased to 400 mg  daily. She is also currently on ASA, Plavix and Warfarin.   She appears euvolemic and if she has no desaturations with walking should be able to go home today and follow with Dr Excell Seltzer on Tuesday in the valve clinic.   Signed, Lars Masson MD, [facility] 10/15/2014

## 2014-10-17 ENCOUNTER — Telehealth: Payer: Self-pay | Admitting: Cardiovascular Disease

## 2014-10-17 DIAGNOSIS — D631 Anemia in chronic kidney disease: Secondary | ICD-10-CM | POA: Diagnosis not present

## 2014-10-17 DIAGNOSIS — N2581 Secondary hyperparathyroidism of renal origin: Secondary | ICD-10-CM | POA: Diagnosis not present

## 2014-10-17 DIAGNOSIS — Z992 Dependence on renal dialysis: Secondary | ICD-10-CM | POA: Diagnosis not present

## 2014-10-17 DIAGNOSIS — N186 End stage renal disease: Secondary | ICD-10-CM | POA: Diagnosis not present

## 2014-10-17 NOTE — Telephone Encounter (Signed)
[name] - she got Rx for Levaquin at hospital discharge --- potential interaction w/ patient's amiodarone   Written for 2 doses by hospitalist - [facility], based on dosing she gave verbal OK for pt to get med. Called pharmacy back & gave OK to fill.

## 2014-10-18 ENCOUNTER — Encounter: Payer: Self-pay | Admitting: Cardiovascular Disease

## 2014-10-18 ENCOUNTER — Ambulatory Visit (INDEPENDENT_AMBULATORY_CARE_PROVIDER_SITE_OTHER): Payer: Medicare Other | Admitting: Cardiovascular Disease

## 2014-10-18 ENCOUNTER — Encounter: Payer: Self-pay | Admitting: *Deleted

## 2014-10-18 VITALS — BP 122/60 | HR 66 | Ht 64.0 in | Wt 116.8 lb

## 2014-10-18 DIAGNOSIS — I480 Paroxysmal atrial fibrillation: Secondary | ICD-10-CM | POA: Diagnosis not present

## 2014-10-18 DIAGNOSIS — I350 Nonrheumatic aortic (valve) stenosis: Secondary | ICD-10-CM

## 2014-10-18 MED ORDER — AMIODARONE HCL 200 MG PO TABS: 200.0000 mg | ORAL_TABLET | Freq: Every day | ORAL | Status: DC

## 2014-10-18 NOTE — Progress Notes (Signed)
Multi-Disciplinary Valve Clinic Note  Chief Complaint  Patient presents with  . Shortness of Breath    History of Present Illness: 81 yo female with history of aortic valve stenosis, end stage renal disease on hemodialysis for 4 years, atrial fibrillation on coumadin, CAD s/p prior PCI and stenting of the RCA and Circumflex, HTN, hyperlipidemia and chronic diastolic CHF who is referred to the valve clinic today for further discussion regarding her aortic valve stenosis and consideration for TAVR. She has known CAD and has undergone previous PCI/stenting of the RCA and Circumflex in 2014. She has normal LV systolic function with diastolic dysfunction. She has been followed closely by Dr. Allyson Sabal for her CAD and her aortic valve disease. Echo 08/18/14 with normal LV systolic function, LVEF=60-65%.  Mean gradient across the aortic valve was , peak gradient 55 mmHg. Aortic valve area estimated at 0.73 cm2. The aortic valve was heavily calcified with restricted mobility of the valve leaflets. There was mild to moderate mitral stenosis with heavily calcified mitral valve. She was seen in the office by Dr. Allyson Sabal 10/04/14 and c/o worsening dyspnea which was felt to be secondary to the progression of her aortic valve disease. She was then admittted to [facility] 10/13/14 with volume overload, pulmonary edema and shortness of breath and was found to be in atrial fibrillation with rapid ventricular response. Her amiodarone was increased and she returned to normal sinus rhythm. Volume overload was managed with dialysis.   She is here today with her daughters. She tells me that she has been feeling poorly for several weeks. She describes dyspnea with minimal exertion. She has constant fatigue and dizziness. She has no palpitations. She has no chest pain. She has been at her normal weight and going to dialysis as scheduled. Seems to be feeling more poorly on higher dose of amiodarone.   Primary Care Physician: Mirna Mires Primary Cardiologist: Dr. Allyson Sabal  Last Lipid Profile:Lipid Panel     Component Value Date/Time   CHOL 154 08/09/2014 1055   TRIG 90 08/09/2014 1055   HDL 57 08/09/2014 1055   CHOLHDL 2.7 08/09/2014 1055   VLDL 18 08/09/2014 1055   LDLCALC 79 08/09/2014 1055     Past Medical History  Diagnosis Date  . Anemia 03/06/2011  . Hypertension   . Coronary artery disease   . Aortic stenosis, mild     severe aortic stenosis  . A-fib   . Acute CHF   . Myocardial infarction 04/2013  . Renal disorder     dialysis  . Dialysis patient   . Hyperlipidemia     Past Surgical History  Procedure Laterality Date  . Av fistula placement      L arm  . Other surgical history Bilateral     Surgery on both arms.   . Fracture surgery  Bil. Arms  . Cardiac catheterization    . Pseudoaneurysm repair Right     leg  . Cataract extraction w/phaco Right 10/19/2013    Procedure: CATARACT EXTRACTION PHACO AND INTRAOCULAR LENS PLACEMENT (IOC);  Surgeon: Loraine Leriche T. Nile Riggs, MD;  Location: [facility];  Service: Ophthalmology;  Laterality: Right;  CDE:15.66  . Cataract extraction w/phaco Left 11/02/2013    Procedure: CATARACT EXTRACTION PHACO AND INTRAOCULAR LENS PLACEMENT (IOC);  Surgeon: Loraine Leriche T. Nile Riggs, MD;  Location: [facility];  Service: Ophthalmology;  Laterality: Left;  CDE:8.78  . Left and right heart catheterization with coronary angiogram N/A 05/11/2013    Procedure: LEFT AND RIGHT HEART CATHETERIZATION WITH CORONARY  ANGIOGRAM;  Surgeon: Runell Gess, MD;  Location: [facility] CATH LAB;  Service: Cardiovascular;  Laterality: N/A;  . Percutaneous coronary rotoblator intervention (pci-r) N/A 05/14/2013    Procedure: PERCUTANEOUS CORONARY ROTOBLATOR INTERVENTION (PCI-R);  Surgeon: Lennette Bihari, MD;  Location: [facility] CATH LAB;  Service: Cardiovascular;  Laterality: N/A;  . Percutaneous coronary rotoblator intervention (pci-r) N/A 05/19/2013    Procedure: PERCUTANEOUS CORONARY ROTOBLATOR INTERVENTION (PCI-R);  Surgeon:  Lennette Bihari, MD;  Location: [facility] CATH LAB;  Service: Cardiovascular;  Laterality: N/A;    Current Outpatient Prescriptions  Medication Sig Dispense Refill  . amiodarone (PACERONE) 200 MG tablet Take 1 tablet (200 mg total) by mouth daily. 30 tablet 5  . aspirin 81 MG EC tablet Take 81 mg by mouth daily.    Marland Kitchen atorvastatin (LIPITOR) 40 MG tablet Take 40 mg by mouth daily.      . clopidogrel (PLAVIX) 75 MG tablet TAKE ONE (1) TABLET EACH DAY 30 tablet 11  . darbepoetin (ARANESP) 100 MCG/0.5ML SOLN injection Inject 100 mcg into the skin See admin instructions. Takes every Monday, Wednesday and Friday with dialysis    . doxercalciferol (HECTOROL) 4 MCG/2ML injection Inject 3.5 mcg into the vein every Monday, Wednesday, and Friday with hemodialysis.    . folic acid (FOLVITE) 1 MG tablet Take 1 mg by mouth daily.    Marland Kitchen levofloxacin (LEVAQUIN) 500 MG tablet Take 1 tablet (500 mg total) by mouth every Monday, Wednesday, and Friday with hemodialysis. 2 tablet 0  . metoprolol tartrate (LOPRESSOR) 25 MG tablet TAKE ONE TABLET BY MOUTH TWICE A DAY 60 tablet 5  . multivitamin (RENA-VIT) TABS tablet Take 1 tablet by mouth daily.    . sevelamer carbonate (RENVELA) 800 MG tablet Take 1,600 mg by mouth 2 (two) times daily with a meal.     . sodium bicarbonate 325 MG tablet Take 325 mg by mouth 2 (two) times daily.    Marland Kitchen warfarin (COUMADIN) 3 MG tablet Take 1.5-3 mg by mouth daily. Takes 3 mg on Sun, Tue, Wed, Thu, and Sat. Takes 1.5mg  on Mon & Fri.     No current facility-administered medications for this visit.    No Known Allergies  History   Social History  . Marital Status: Married    Spouse Name: N/A  . Number of Children: N/A  . Years of Education: N/A   Occupational History  . Not on file.   Social History Main Topics  . Smoking status: Never Smoker   . Smokeless tobacco: Never Used  . Alcohol Use: No  . Drug Use: No  . Sexual Activity: Yes    Birth Control/ Protection: Post-menopausal     Other Topics Concern  . Not on file   Social History Narrative    Family History  Problem Relation Age of Onset  . Heart attack Sister     Review of Systems:  As stated in the HPI and otherwise negative.   BP 122/60 mmHg  Pulse 66  Ht 5\' 4"  (1.626 m)  Wt 116 lb 12.8 oz (52.98 kg)  BMI 20.04 kg/m2  Physical Examination: General: Thin elderly female. NAD HEENT: OP clear, mucus membranes moist SKIN: warm, dry. No rashes. Neuro: No focal deficits Musculoskeletal: Muscle strength 5/5 all ext Psychiatric: Mood and affect normal Neck: No JVD, transmitted sounds to right carotid, no thyromegaly, no lymphadenopathy. Lungs:Clear bilaterally, no wheezes, rhonci, crackles Cardiovascular: Regular rate and rhythm. Loud harsh systolic murmur. No gallops or rubs. Abdomen:Soft. Bowel sounds present.  Non-tender.  Extremities: No lower extremity edema.   Echo 08/18/14: Left ventricle: The cavity size was normal. There was mild focal basal hypertrophy of the septum. Systolic function was normal. The estimated ejection fraction was in the range of 60% to 65%. Wall motion was normal; there were no regional wall motion abnormalities. Doppler parameters are consistent with a reversible restrictive pattern, indicative of decreased left ventricular diastolic compliance and/or increased left atrial pressure (grade 3 diastolic dysfunction). Doppler parameters are consistent with both elevated ventricular end-diastolic filling pressure and elevated left atrial filling pressure. - Aortic valve: Trileaflet; severely thickened, severely calcified leaflets. Valve mobility was restricted. There was moderate to severe stenosis. Mean gradient (S): 33 mm Hg. Peak gradient (S): 55 mm Hg. Valve area (VTI): 0.7 cm^2. Valve area (Vmax): 0.73 cm^2. Valve area (Vmean): 0.65 cm^2. - Aortic root: The aortic root was normal in size. - Mitral valve: Calcified annulus. Severely  thickened, severely calcified leaflets . Mobility was restricted. The findings are consistent with mild to moderate stenosis. There was moderate regurgitation. Mean gradient (D): 5 mm Hg. Peak gradient (D): 14 mm Hg. Valve area by continuity equation (using LVOT flow): 1.3 cm^2. - Left atrium: The atrium was moderately to severely dilated. - Right ventricle: The cavity size was mildly dilated. Wall thickness was normal. Systolic function was mildly reduced. - Right atrium: The atrium was moderately dilated. - Tricuspid valve: There was mild regurgitation. - Pulmonary arteries: Systolic pressure was moderately increased. PA peak pressure: 51 mm Hg (S).  EKG:  EKG is not ordered today. The ekg ordered today demonstrates   Recent Labs: 10/12/2014: B Natriuretic Peptide >4500.0*; Magnesium 1.9 10/13/2014: ALT 9 10/15/2014: BUN 13; Creatinine 3.89*; Hemoglobin 9.7*; Platelets 371; Potassium 4.4; Sodium 134*     Wt Readings from Last 3 Encounters:  10/18/14 116 lb 12.8 oz (52.98 kg)  10/15/14 113 lb 12.1 oz (51.6 kg)  10/04/14 119 lb (53.978 kg)     Other studies Reviewed: Additional studies/ records that were reviewed today include: Prior office notes from Dr. Allyson Sabal, echocardiogram, old cath films Review of the above records demonstrates:   STS Risk Score: Risk of Mortality: 21.923%  Morbidity or Mortality: 57.945%  Long Length of Stay: 39.164%  Short Length of Stay: 3.07%  Permanent Stroke: 7.205%  Prolonged Ventilation: 56.223%  DSW Infection: 0.41%  Renal Failure: N/A  Reoperation: 22.91%    Assessment and Plan:   1. Severe aortic valve stenosis: She has stage D severe symptomatic aortic stenosis. She is significantly limited by dyspnea and fatigue. This has progressed over the last 6 months. Her echocardiogram demonstrates severe aortic valve stenosis with heavy calcification of the valve leaflets and restricted mobility of the valve leaflets. Gradients across  the valve are elevated suggesting hemodynamically significant severe AS. She also has a severely calcified mitral valve with at least moderate mitral regurgitation and mild to moderate mitral stenosis. I think that she would benefit from aortic valve replacement. She does not appear to be a good candidate for traditional AVR given multiple comorbidities including end stage renal disease on hemodialysis, advanced age and poor overall functional status. Her STS risk score indicates a 21.9% risk of mortality with traditional AVR. She appears to be a good candidate for TAVR. I have reviewed the TAVR procedure at length today with the patient and her daughters. I have reviewed the etiology of aortic stenosis as well as normal disease progression. I have reviwed the TAVR procedure in detail and have shown her  heart models as well as the model of the aortic valve. She is not sure that she wishes to proceed but she would like to go ahead and arrange the cardiac cath to assess her pressures and CAD. I will plan a right and [facility]. Risks and benefits of the cath are reviewed today. She will hold coumadin 5 days before the cath. No bridging is needed. She will get pre-cath labs on Monday 10/24/14. If she agrees to proceed with the evaluation for TAVR following the cath, will arrange appt with Dr. Cornelius Moraswen and Dr. Laneta SimmersBartle. Will plan CT scans after her cath.   2. Atrial fibrillation, paroxysmal: Sinus today but more dizzy since amiodarone was increased. Will lower dose to 200 mg daily. Continue coumadin but hold for procedure.   3. End stage renal disease: Will plan dialysis on Monday 10/24/14 followed by cath on 10/25/14.   Current medicines are reviewed at length with the patient today.  The patient does not have concerns regarding medicines.  The following changes have been made:  Amiodarone reduced to 200 mg daily  Labs/ tests ordered today include:   Orders Placed This Encounter  Procedures    . CBC w/Diff  . INR/PT  . Basic Metabolic Panel (BMET)    Disposition:   Follow up will be the cardiac cath.  Signed, Verne Carrowhristopher Trishna Cwik, MD 10/18/2014 2:56 PM    [facility] [address], [place], [place]  [address] Phone: [phone]; Fax: [phone]

## 2014-10-18 NOTE — Patient Instructions (Addendum)
Medication Instructions:   Decrease amiodarone to 200 mg by mouth daily See catheterization instruction sheet for instructions regarding Coumadin prior to procedure  Labwork: Your physician recommends that you return for lab work on October 24, 2014. The lab opens at 7:30 AM  (BMP,CBC and PT to be done)     Testing/Procedures:   Your physician has requested that you have a cardiac catheterization. Cardiac catheterization is used to diagnose and/or treat various heart conditions. Doctors may recommend this procedure for a number of different reasons. The most common reason is to evaluate chest pain. Chest pain can be a symptom of coronary artery disease (CAD), and cardiac catheterization can show whether plaque is narrowing or blocking your heart's arteries. This procedure is also used to evaluate the valves, as well as measure the blood flow and oxygen levels in different parts of your heart. For further information please visit [email or web address]. Please follow instruction sheet, as given. Scheduled for October 25, 2014   Follow-Up:   To be set up after catheterization   Coronary Angiogram A coronary angiogram, also called coronary angiography, is an X-ray procedure used to look at the arteries in the heart. In this procedure, a dye (contrast dye) is injected through a long, hollow tube (catheter). The catheter is about the size of a piece of cooked spaghetti and is inserted through your groin, wrist, or arm. The dye is injected into each artery, and X-rays are then taken to show if there is a blockage in the arteries of your heart. LET [facility] PROVID[facility] KNOW ABOUT:  Any allergies you have, including allergies to shellfish or contrast dye.   All medicines you are taking, including vitamins, herbs, eye drops, creams, and over-the-counter medicines.   Previous problems you or members of your family have had with the use of anesthetics.   Any blood disorders you have.   Previous  surgeries you have had.  History of kidney problems or failure.   Other medical conditions you have. RISKS AND COMPLICATIONS  Generally, a coronary angiogram is a safe procedure. However, problems can occur and include:  Allergic reaction to the dye.  Bleeding from the access site or other locations.  Kidney injury, especially in people with impaired kidney function.  Stroke (rare).  Heart attack (rare). BEFORE THE PROCEDURE   Do not eat or drink anything after midnight the night before the procedure or as directed by your health care provider.   Ask your health care provider about changing or stopping your regular medicines. This is especially important if you are taking diabetes medicines or blood thinners. PROCEDURE  You may be given a medicine to help you relax (sedative) before the procedure. This medicine is given through an intravenous (IV) access tube that is inserted into one of your veins.   The area where the catheter will be inserted will be washed and shaved. This is usually done in the groin but may be done in the fold of your arm (near your elbow) or in the wrist.   A medicine will be given to numb the area where the catheter will be inserted (local anesthetic).   The health care provider will insert the catheter into an artery. The catheter will be guided by using a special type of X-ray (fluoroscopy) of the blood vessel being examined.   A special dye will then be injected into the catheter, and X-rays will be taken. The dye will help to show where any narrowing or blockages are located  in the heart arteries.  AFTER THE PROCEDURE   If the procedure is done through the leg, you will be kept in bed lying flat for several hours. You will be instructed to not bend or cross your legs.  The insertion site will be checked frequently.   The pulse in your feet or wrist will be checked frequently.   Additional blood tests, X-rays, and an electrocardiogram  may be done.  Document Released: 12/29/2002 Document Revised: 11/08/2013 Document Reviewed: 11/16/2012 [facility] Patient Information 2015 [place], [place]. This information is not intended to replace advice given to you by your health care provider. Make sure you discuss any questions you have with your health care provider.

## 2014-10-19 DIAGNOSIS — N186 End stage renal disease: Secondary | ICD-10-CM | POA: Diagnosis not present

## 2014-10-19 DIAGNOSIS — N2581 Secondary hyperparathyroidism of renal origin: Secondary | ICD-10-CM | POA: Diagnosis not present

## 2014-10-19 DIAGNOSIS — D631 Anemia in chronic kidney disease: Secondary | ICD-10-CM | POA: Diagnosis not present

## 2014-10-19 DIAGNOSIS — Z992 Dependence on renal dialysis: Secondary | ICD-10-CM | POA: Diagnosis not present

## 2014-10-19 LAB — CULTURE, BLOOD (ROUTINE X 2)
CULTURE: NO GROWTH
CULTURE: NO GROWTH

## 2014-10-20 ENCOUNTER — Telehealth: Payer: Self-pay | Admitting: Cardiovascular Disease

## 2014-10-20 MED ORDER — ALPRAZOLAM 0.25 MG PO TABS: 0.2500 mg | ORAL_TABLET | Freq: Two times a day (BID) | ORAL | Status: DC | PRN

## 2014-10-20 NOTE — Telephone Encounter (Signed)
I spoke with pt's daughter and gave her information from Dr. Clifton JamesMcAlhany.  [facility]  Pt's daughter also notified time of cath has been changed and she should arrive at hospital at 8:00.

## 2014-10-20 NOTE — Telephone Encounter (Signed)
Xanax 0.25 mg po BID prn anxiety with 10 pills. Thanks, cdm

## 2014-10-20 NOTE — Telephone Encounter (Signed)
New message       Pt is having a procedure on Tuesday the 19th.  Can Dr Clifton JamesMcAlhany call in something for her nerves?  Please call

## 2014-10-21 DIAGNOSIS — Z992 Dependence on renal dialysis: Secondary | ICD-10-CM | POA: Diagnosis not present

## 2014-10-21 DIAGNOSIS — D631 Anemia in chronic kidney disease: Secondary | ICD-10-CM | POA: Diagnosis not present

## 2014-10-21 DIAGNOSIS — N2581 Secondary hyperparathyroidism of renal origin: Secondary | ICD-10-CM | POA: Diagnosis not present

## 2014-10-21 DIAGNOSIS — N186 End stage renal disease: Secondary | ICD-10-CM | POA: Diagnosis not present

## 2014-10-24 ENCOUNTER — Other Ambulatory Visit (INDEPENDENT_AMBULATORY_CARE_PROVIDER_SITE_OTHER): Payer: Medicare Other | Admitting: *Deleted

## 2014-10-24 ENCOUNTER — Other Ambulatory Visit: Payer: Self-pay | Admitting: *Deleted

## 2014-10-24 DIAGNOSIS — N2581 Secondary hyperparathyroidism of renal origin: Secondary | ICD-10-CM | POA: Diagnosis not present

## 2014-10-24 DIAGNOSIS — I350 Nonrheumatic aortic (valve) stenosis: Secondary | ICD-10-CM

## 2014-10-24 DIAGNOSIS — D631 Anemia in chronic kidney disease: Secondary | ICD-10-CM | POA: Diagnosis not present

## 2014-10-24 DIAGNOSIS — Z992 Dependence on renal dialysis: Secondary | ICD-10-CM | POA: Diagnosis not present

## 2014-10-24 DIAGNOSIS — N186 End stage renal disease: Secondary | ICD-10-CM | POA: Diagnosis not present

## 2014-10-24 LAB — CBC WITH DIFFERENTIAL/PLATELET
BASOS PCT: 1.1 % (ref 0.0–3.0)
Basophils Absolute: 0.1 10*3/uL (ref 0.0–0.1)
Eosinophils Absolute: 2.8 10*3/uL — ABNORMAL HIGH (ref 0.0–0.7)
Eosinophils Relative: 21.3 % — ABNORMAL HIGH (ref 0.0–5.0)
HEMATOCRIT: 33.1 % — AB (ref 36.0–46.0)
HEMOGLOBIN: 10.6 g/dL — AB (ref 12.0–15.0)
Lymphocytes Relative: 13.3 % (ref 12.0–46.0)
Lymphs Abs: 1.8 10*3/uL (ref 0.7–4.0)
MCHC: 31.9 g/dL (ref 30.0–36.0)
MCV: 84.6 fl (ref 78.0–100.0)
MONO ABS: 0.6 10*3/uL (ref 0.1–1.0)
MONOS PCT: 4.7 % (ref 3.0–12.0)
NEUTROS ABS: 7.9 10*3/uL — AB (ref 1.4–7.7)
Neutrophils Relative %: 59.6 % (ref 43.0–77.0)
Platelets: 340 10*3/uL (ref 150.0–400.0)
RBC: 3.91 Mil/uL (ref 3.87–5.11)
RDW: 18.2 % — ABNORMAL HIGH (ref 11.5–15.5)
WBC: 13.3 10*3/uL — ABNORMAL HIGH (ref 4.0–10.5)

## 2014-10-24 LAB — PROTIME-INR
INR: 3.8 ratio — ABNORMAL HIGH (ref 0.8–1.0)
PROTHROMBIN TIME: 41 s — AB (ref 9.6–13.1)

## 2014-10-24 LAB — BASIC METABOLIC PANEL
BUN: 11 mg/dL (ref 6–23)
CHLORIDE: 96 meq/L (ref 96–112)
CO2: 35 mEq/L — ABNORMAL HIGH (ref 19–32)
Calcium: 9 mg/dL (ref 8.4–10.5)
Creatinine, Ser: 3.2 mg/dL — ABNORMAL HIGH (ref 0.40–1.20)
GFR: 17.73 mL/min — AB (ref >60.00)
Glucose, Bld: 145 mg/dL — ABNORMAL HIGH (ref 70–99)
Potassium: 3.3 mEq/L — ABNORMAL LOW (ref 3.5–5.1)
Sodium: 137 mEq/L (ref 135–145)

## 2014-10-25 NOTE — Addendum Note (Signed)
Addended by: Verne CarrowMCALHANY, Caila Cirelli D on: 10/25/2014 12:36 PM   Modules accepted: Orders

## 2014-10-26 ENCOUNTER — Encounter: Payer: Medicare Other | Admitting: Surgery

## 2014-10-26 DIAGNOSIS — Z992 Dependence on renal dialysis: Secondary | ICD-10-CM | POA: Diagnosis not present

## 2014-10-26 DIAGNOSIS — N2581 Secondary hyperparathyroidism of renal origin: Secondary | ICD-10-CM | POA: Diagnosis not present

## 2014-10-26 DIAGNOSIS — N186 End stage renal disease: Secondary | ICD-10-CM | POA: Diagnosis not present

## 2014-10-26 DIAGNOSIS — D631 Anemia in chronic kidney disease: Secondary | ICD-10-CM | POA: Diagnosis not present

## 2014-10-28 DIAGNOSIS — N186 End stage renal disease: Secondary | ICD-10-CM | POA: Diagnosis not present

## 2014-10-28 DIAGNOSIS — D631 Anemia in chronic kidney disease: Secondary | ICD-10-CM | POA: Diagnosis not present

## 2014-10-28 DIAGNOSIS — Z992 Dependence on renal dialysis: Secondary | ICD-10-CM | POA: Diagnosis not present

## 2014-10-28 DIAGNOSIS — N2581 Secondary hyperparathyroidism of renal origin: Secondary | ICD-10-CM | POA: Diagnosis not present

## 2014-10-31 ENCOUNTER — Other Ambulatory Visit (INDEPENDENT_AMBULATORY_CARE_PROVIDER_SITE_OTHER): Payer: Medicare Other | Admitting: *Deleted

## 2014-10-31 DIAGNOSIS — N186 End stage renal disease: Secondary | ICD-10-CM | POA: Diagnosis not present

## 2014-10-31 DIAGNOSIS — I350 Nonrheumatic aortic (valve) stenosis: Secondary | ICD-10-CM | POA: Diagnosis not present

## 2014-10-31 DIAGNOSIS — D631 Anemia in chronic kidney disease: Secondary | ICD-10-CM | POA: Diagnosis not present

## 2014-10-31 DIAGNOSIS — N2581 Secondary hyperparathyroidism of renal origin: Secondary | ICD-10-CM | POA: Diagnosis not present

## 2014-10-31 DIAGNOSIS — Z992 Dependence on renal dialysis: Secondary | ICD-10-CM | POA: Diagnosis not present

## 2014-10-31 LAB — PROTIME-INR
INR: 1.5 ratio — ABNORMAL HIGH (ref 0.8–1.0)
PROTHROMBIN TIME: 16 s — AB (ref 9.6–13.1)

## 2014-11-02 DIAGNOSIS — Z992 Dependence on renal dialysis: Secondary | ICD-10-CM | POA: Diagnosis not present

## 2014-11-02 DIAGNOSIS — N186 End stage renal disease: Secondary | ICD-10-CM | POA: Diagnosis not present

## 2014-11-02 DIAGNOSIS — D631 Anemia in chronic kidney disease: Secondary | ICD-10-CM | POA: Diagnosis not present

## 2014-11-02 DIAGNOSIS — N2581 Secondary hyperparathyroidism of renal origin: Secondary | ICD-10-CM | POA: Diagnosis not present

## 2014-11-03 ENCOUNTER — Ambulatory Visit (HOSPITAL_COMMUNITY)
Admission: RE | Admit: 2014-11-03 | Discharge: 2014-11-04 | Disposition: A | Discharge status: Home / Self Care | Payer: Medicare Other | Source: Ambulatory Visit | Attending: Cardiovascular Disease | Admitting: Cardiovascular Disease

## 2014-11-03 ENCOUNTER — Encounter (HOSPITAL_COMMUNITY)
Admission: RE | Disposition: A | Discharge status: Home / Self Care | Payer: Medicare Other | Source: Ambulatory Visit | Attending: Cardiovascular Disease

## 2014-11-03 ENCOUNTER — Encounter (HOSPITAL_COMMUNITY): Payer: Self-pay | Admitting: General Practice

## 2014-11-03 DIAGNOSIS — I120 Hypertensive chronic kidney disease with stage 5 chronic kidney disease or end stage renal disease: Secondary | ICD-10-CM | POA: Diagnosis not present

## 2014-11-03 DIAGNOSIS — D649 Anemia, unspecified: Secondary | ICD-10-CM | POA: Diagnosis present

## 2014-11-03 DIAGNOSIS — Z79899 Other long term (current) drug therapy: Secondary | ICD-10-CM | POA: Diagnosis not present

## 2014-11-03 DIAGNOSIS — I252 Old myocardial infarction: Secondary | ICD-10-CM | POA: Insufficient documentation

## 2014-11-03 DIAGNOSIS — I10 Essential (primary) hypertension: Secondary | ICD-10-CM | POA: Diagnosis present

## 2014-11-03 DIAGNOSIS — I5032 Chronic diastolic (congestive) heart failure: Secondary | ICD-10-CM | POA: Diagnosis not present

## 2014-11-03 DIAGNOSIS — I480 Paroxysmal atrial fibrillation: Secondary | ICD-10-CM | POA: Insufficient documentation

## 2014-11-03 DIAGNOSIS — Z7982 Long term (current) use of aspirin: Secondary | ICD-10-CM | POA: Insufficient documentation

## 2014-11-03 DIAGNOSIS — I350 Nonrheumatic aortic (valve) stenosis: Secondary | ICD-10-CM

## 2014-11-03 DIAGNOSIS — Z7902 Long term (current) use of antithrombotics/antiplatelets: Secondary | ICD-10-CM | POA: Diagnosis not present

## 2014-11-03 DIAGNOSIS — E785 Hyperlipidemia, unspecified: Secondary | ICD-10-CM | POA: Diagnosis present

## 2014-11-03 DIAGNOSIS — I080 Rheumatic disorders of both mitral and aortic valves: Secondary | ICD-10-CM | POA: Insufficient documentation

## 2014-11-03 DIAGNOSIS — N186 End stage renal disease: Secondary | ICD-10-CM | POA: Diagnosis not present

## 2014-11-03 DIAGNOSIS — I2510 Atherosclerotic heart disease of native coronary artery without angina pectoris: Secondary | ICD-10-CM | POA: Diagnosis not present

## 2014-11-03 DIAGNOSIS — Z7901 Long term (current) use of anticoagulants: Secondary | ICD-10-CM | POA: Diagnosis not present

## 2014-11-03 DIAGNOSIS — T82858A Stenosis of vascular prosthetic devices, implants and grafts, initial encounter: Secondary | ICD-10-CM | POA: Diagnosis not present

## 2014-11-03 DIAGNOSIS — Y832 Surgical operation with anastomosis, bypass or graft as the cause of abnormal reaction of the patient, or of later complication, without mention of misadventure at the time of the procedure: Secondary | ICD-10-CM | POA: Diagnosis not present

## 2014-11-03 DIAGNOSIS — Z992 Dependence on renal dialysis: Secondary | ICD-10-CM | POA: Diagnosis not present

## 2014-11-03 HISTORY — DX: Dependence on renal dialysis: N18.6

## 2014-11-03 HISTORY — DX: Pneumonia, unspecified organism: J18.9

## 2014-11-03 HISTORY — DX: Anxiety disorder, unspecified: F41.9

## 2014-11-03 HISTORY — DX: Dependence on renal dialysis: Z99.2

## 2014-11-03 HISTORY — DX: Cardiac murmur, unspecified: R01.1

## 2014-11-03 HISTORY — PX: LEFT AND RIGHT HEART CATHETERIZATION WITH CORONARY ANGIOGRAM: SHX5449

## 2014-11-03 LAB — POCT I-STAT 3, ART BLOOD GAS (G3+)
ACID-BASE EXCESS: 4 mmol/L — AB (ref 0.0–2.0)
Bicarbonate: 27.8 mEq/L — ABNORMAL HIGH (ref 20.0–24.0)
O2 Saturation: 98 %
TCO2: 29 mmol/L (ref 0–100)
pCO2 arterial: 38.3 mmHg (ref 35.0–45.0)
pH, Arterial: 7.468 — ABNORMAL HIGH (ref 7.350–7.450)
pO2, Arterial: 106 mmHg — ABNORMAL HIGH (ref 80.0–100.0)

## 2014-11-03 LAB — POCT I-STAT 3, VENOUS BLOOD GAS (G3P V)
ACID-BASE EXCESS: 1 mmol/L (ref 0.0–2.0)
Bicarbonate: 26 mEq/L — ABNORMAL HIGH (ref 20.0–24.0)
O2 Saturation: 69 %
PH VEN: 7.413 — AB (ref 7.250–7.300)
PO2 VEN: 36 mmHg (ref 30.0–45.0)
TCO2: 27 mmol/L (ref 0–100)
pCO2, Ven: 40.8 mmHg — ABNORMAL LOW (ref 45.0–50.0)

## 2014-11-03 LAB — PROTIME-INR
INR: 1.58 — AB (ref 0.00–1.49)
PROTHROMBIN TIME: 19 s — AB (ref 11.6–15.2)

## 2014-11-03 LAB — POCT ACTIVATED CLOTTING TIME: ACTIVATED CLOTTING TIME: 417 s

## 2014-11-03 LAB — BASIC METABOLIC PANEL
ANION GAP: 10 (ref 5–15)
BUN: 8 mg/dL (ref 6–23)
CO2: 30 mmol/L (ref 19–32)
Calcium: 9.5 mg/dL (ref 8.4–10.5)
Chloride: 97 mmol/L (ref 96–112)
Creatinine, Ser: 4.34 mg/dL — ABNORMAL HIGH (ref 0.50–1.10)
GFR calc Af Amer: 10 mL/min — ABNORMAL LOW (ref >90)
GFR, EST NON AFRICAN AMERICAN: 9 mL/min — AB (ref >90)
Glucose, Bld: 82 mg/dL (ref 70–99)
POTASSIUM: 3.1 mmol/L — AB (ref 3.5–5.1)
SODIUM: 137 mmol/L (ref 135–145)

## 2014-11-03 SURGERY — LEFT AND RIGHT HEART CATHETERIZATION WITH CORONARY ANGIOGRAM
Anesthesia: LOCAL

## 2014-11-03 MED ORDER — SEVELAMER CARBONATE 800 MG PO TABS
1600.0000 mg | ORAL_TABLET | Freq: Two times a day (BID) | ORAL | Status: DC
  Administered 2014-11-03 – 2014-11-04 (×3): 1600 mg via ORAL
  Filled 2014-11-03 (×4): qty 2

## 2014-11-03 MED ORDER — ATORVASTATIN CALCIUM 40 MG PO TABS
40.0000 mg | ORAL_TABLET | Freq: Every day | ORAL | Status: DC
  Administered 2014-11-04: 10:00:00 40 mg via ORAL
  Filled 2014-11-03: qty 1

## 2014-11-03 MED ORDER — WARFARIN SODIUM 3 MG PO TABS
3.0000 mg | ORAL_TABLET | Freq: Once | ORAL | Status: DC
  Filled 2014-11-03: qty 1

## 2014-11-03 MED ORDER — METOPROLOL TARTRATE 25 MG PO TABS
25.0000 mg | ORAL_TABLET | Freq: Two times a day (BID) | ORAL | Status: DC
  Administered 2014-11-03 – 2014-11-04 (×2): 25 mg via ORAL
  Filled 2014-11-03 (×2): qty 1

## 2014-11-03 MED ORDER — WARFARIN - PHARMACIST DOSING INPATIENT: Freq: Every day | Status: DC

## 2014-11-03 MED ORDER — CLOPIDOGREL BISULFATE 300 MG PO TABS
ORAL_TABLET | ORAL | Status: AC
  Filled 2014-11-03: qty 1

## 2014-11-03 MED ORDER — ALPRAZOLAM 0.25 MG PO TABS: 0.2500 mg | ORAL_TABLET | Freq: Two times a day (BID) | ORAL | Status: DC | PRN

## 2014-11-03 MED ORDER — SODIUM BICARBONATE 650 MG PO TABS
325.0000 mg | ORAL_TABLET | Freq: Two times a day (BID) | ORAL | Status: DC
  Administered 2014-11-03 – 2014-11-04 (×2): 325 mg via ORAL
  Filled 2014-11-03 (×4): qty 0.5

## 2014-11-03 MED ORDER — ENSURE ENLIVE PO LIQD
237.0000 mL | Freq: Two times a day (BID) | ORAL | Status: DC
  Administered 2014-11-04 (×2): 237 mL via ORAL
  Filled 2014-11-03 (×4): qty 237

## 2014-11-03 MED ORDER — SODIUM CHLORIDE 0.9 % IJ SOLN: 3.0000 mL | INTRAMUSCULAR | Status: DC | PRN

## 2014-11-03 MED ORDER — ONDANSETRON HCL 4 MG/2ML IJ SOLN: 4.0000 mg | Freq: Four times a day (QID) | INTRAMUSCULAR | Status: DC | PRN

## 2014-11-03 MED ORDER — ASPIRIN 81 MG PO CHEW: 81.0000 mg | CHEWABLE_TABLET | ORAL | Status: DC

## 2014-11-03 MED ORDER — MIDAZOLAM HCL 2 MG/2ML IJ SOLN
INTRAMUSCULAR | Status: AC
  Filled 2014-11-03: qty 2

## 2014-11-03 MED ORDER — FOLIC ACID 1 MG PO TABS
1.0000 mg | ORAL_TABLET | Freq: Every day | ORAL | Status: DC
  Administered 2014-11-04: 1 mg via ORAL
  Filled 2014-11-03: qty 1

## 2014-11-03 MED ORDER — RENA-VITE PO TABS
1.0000 | ORAL_TABLET | Freq: Every day | ORAL | Status: DC
  Administered 2014-11-04: 10:00:00 1 via ORAL
  Filled 2014-11-03: qty 1

## 2014-11-03 MED ORDER — HEPARIN (PORCINE) IN NACL 2-0.9 UNIT/ML-% IJ SOLN
INTRAMUSCULAR | Status: AC
  Filled 2014-11-03: qty 1000

## 2014-11-03 MED ORDER — LIDOCAINE HCL (PF) 1 % IJ SOLN
INTRAMUSCULAR | Status: AC
  Filled 2014-11-03: qty 30

## 2014-11-03 MED ORDER — WARFARIN SODIUM 1 MG PO TABS
1.5000 mg | ORAL_TABLET | Freq: Once | ORAL | Status: AC
  Administered 2014-11-03: 18:00:00 1.5 mg via ORAL
  Filled 2014-11-03: qty 1

## 2014-11-03 MED ORDER — AMIODARONE HCL 200 MG PO TABS
200.0000 mg | ORAL_TABLET | Freq: Every day | ORAL | Status: DC
Start: 2014-11-04 — End: 2014-11-04
  Administered 2014-11-04: 200 mg via ORAL
  Filled 2014-11-03: qty 1

## 2014-11-03 MED ORDER — SODIUM CHLORIDE 0.9 % IJ SOLN: 3.0000 mL | Freq: Two times a day (BID) | INTRAMUSCULAR | Status: DC

## 2014-11-03 MED ORDER — BIVALIRUDIN 250 MG IV SOLR
INTRAVENOUS | Status: AC
  Filled 2014-11-03: qty 250

## 2014-11-03 MED ORDER — SODIUM BICARBONATE 650 MG PO TABS
325.0000 mg | ORAL_TABLET | Freq: Two times a day (BID) | ORAL | Status: DC
Start: 2014-11-03 — End: 2014-11-03

## 2014-11-03 MED ORDER — ACETAMINOPHEN 325 MG PO TABS: 650.0000 mg | ORAL_TABLET | ORAL | Status: DC | PRN

## 2014-11-03 MED ORDER — TRAMADOL HCL 50 MG PO TABS
50.0000 mg | ORAL_TABLET | Freq: Four times a day (QID) | ORAL | Status: DC | PRN
  Administered 2014-11-04: 06:00:00 50 mg via ORAL
  Filled 2014-11-03: qty 1

## 2014-11-03 MED ORDER — CLOPIDOGREL BISULFATE 75 MG PO TABS
75.0000 mg | ORAL_TABLET | Freq: Every day | ORAL | Status: DC
  Administered 2014-11-04: 75 mg via ORAL
  Filled 2014-11-03: qty 1

## 2014-11-03 MED ORDER — SODIUM CHLORIDE 0.9 % IV SOLN: 250.0000 mL | INTRAVENOUS | Status: DC | PRN

## 2014-11-03 NOTE — CV Procedure (Addendum)
Cardiac Catheterization Operative Report  [name] [id number] 4/28/201611:57 AM [name], MD  Procedure Performed:  1. Left Heart Catheterization 2. Selective Coronary Angiography 3. Right Heart Catheterization 4. Aortic root angiogram 5. PTCA with balloon angioplasty, cutting balloon angioplasty ostial RCA restenosis within old stent  Operator: Verne Carrow, MD  Indication:  81 yo female with history of aortic valve stenosis, end stage renal disease on hemodialysis for 4 years, atrial fibrillation on coumadin, CAD s/p prior PCI and stenting of the RCA and Circumflex, HTN, hyperlipidemia and chronic diastolic CHF who I recently saw for evaluation for evaluation for TAVR. Her AS has progressed to a severe range. She is now having dyspnea with minimal exertion. Right and left heart cath today to assess coronaries, valve gradient and pressures.                             Procedure Details: The risks, benefits, complications, treatment options, and expected outcomes were discussed with the patient. The patient and/or family concurred with the proposed plan, giving informed consent. The patient was brought to the cath lab after IV hydration was begun and oral premedication was given. The patient was further sedated with Versed. The right groin was prepped and draped in the usual manner. Using the modified Seldinger access technique, a 5 French sheath was placed in the right femoral artery. A 7 French sheath was inserted into the right femoral vein. A balloon tipped catheter was used to perform a right heart catheterization. Standard diagnostic catheters were used to perform selective coronary angiography. I crossed the aortic valve using an AL-1 catheter and long straight wire. A pigtail catheter was advanced over the wire and into the LV. No LV gram. Pressures measured. Aortic root angiogram was performed. She was found to have severe restenosis in the stented segment of  the ostium and proximal portion of the RCA. I elected to proceed to PCI of the this segment given the complexity of arranging her cardiac cath with dialysis and long term coumadin therapy.   PCI Note: The sheath was upsized to a 6 [place] system. She was given a weight based bolus of Angiomax and a drip was started. She was given Plavix 600 mg po x 1. When the ACT was over 200, I engaged the RCA with a 6 [place] JR4 guiding catheter with sideholes. I then advanced a Cougar IC wire down the RCA into the distal vessel. A 3.0 x 12 mm [place] balloon was used to dilate the restenotic ostial and proximal stented segment. I then used a 3.0 x 10 mm Angiosculpt cutting balloon to dilate this segment. A 3.5 x 12 mm [place] balloon was then used for the final dilatation. The stenosis was taken from 95% down to 40%. There was excellent flow down the vessel.   There were no immediate complications. The patient was taken to the recovery area in stable condition.   Hemodynamic Findings: Ao: 152/46          LV: 169/14/26 RA:  8              RV: 75/3/10 PA:  72/19 (mean 39)     PCWP:  19 Fick Cardiac Output: 4.9 L/min Fick Cardiac Index: 3.1 L/min/m2 Central Aortic Saturation: 98% Pulmonary Artery Saturation: 69%  Aortic valve data:  Peak to peak gradient: 17 mmHg Mean gradient 17 mmHg AVA 1.33 cm2  Angiographic Findings:  Left main:  Ostial 20% stenosis.   Left Anterior Descending Artery: Large caliber vessel that courses to the apex. The entire vessel is heavily calcified. There is a 40% ostial stenosis. The proximal vessel has diffuse 30% stenosis. The mid vessel has diffuse 40% stenosis with no focally obstructive lesions. The distal vessel has diffuse plaque. There are several small caliber diagonal branches.   Circumflex Artery: Large caliber vessel with patent mid stented segment without restenosis. The first obtuse marginal branch is patent with diffuse calcification and 50% stenosis in the superior  sub-branch beyond the bifurcation. The AV groove Circumflex is occluded, chronic.   Right Coronary Artery: Large dominant vessel with heavy calcification noted in the entire vessel. The ostial/proximal stented segment has 95% restenosis. The mid stented segment is patent with diffuse 20% restenosis. The PDA and PLA are small in caliber with 60-70% stenosis noted in the ostium of both vessels.   Left Ventricular Angiogram: Deferred.   Aortic root angiogram: Heavily calcified. No aneurysmal dilatation.   Impression: 1. Severe double vessel CAD 2. Patent stents mid Circumflex 3. Severe restenosis proximal RCA stent, now s/p successful PTCA/cutting balloon angioplasty RCA stented segment ostium/proximal vessel 4. Severe aortic valve stenosis  Recommendations: Will continue Plavix and restart coumadin. Monitor overnight. HD here in am. She will then f/u with Dr. Cornelius Moraswen and Dr. Laneta SimmersBartle.        Complications:  None; patient tolerated the procedure well.

## 2014-11-03 NOTE — H&P (View-Only) (Signed)
Multi-Disciplinary Valve Clinic Note  Chief Complaint  Patient presents with  . Shortness of Breath    History of Present Illness: 81 yo female with history of aortic valve stenosis, end stage renal disease on hemodialysis for 4 years, atrial fibrillation on coumadin, CAD s/p prior PCI and stenting of the RCA and Circumflex, HTN, hyperlipidemia and chronic diastolic CHF who is referred to the valve clinic today for further discussion regarding her aortic valve stenosis and consideration for TAVR. She has known CAD and has undergone previous PCI/stenting of the RCA and Circumflex in 2014. She has normal LV systolic function with diastolic dysfunction. She has been followed closely by Dr. Berry for her CAD and her aortic valve disease. Echo 08/18/14 with normal LV systolic function, LVEF=60-65%.  Mean gradient across the aortic valve was 33mmHg, peak gradient 55 mmHg. Aortic valve area estimated at 0.73 cm2. The aortic valve was heavily calcified with restricted mobility of the valve leaflets. There was mild to moderate mitral stenosis with heavily calcified mitral valve. She was seen in the office by Dr. Berry 10/04/14 and c/o worsening dyspnea which was felt to be secondary to the progression of her aortic valve disease. She was then admittted to [facility] 10/13/14 with volume overload, pulmonary edema and shortness of breath and was found to be in atrial fibrillation with rapid ventricular response. Her amiodarone was increased and she returned to normal sinus rhythm. Volume overload was managed with dialysis.   She is here today with her daughters. She tells me that she has been feeling poorly for several weeks. She describes dyspnea with minimal exertion. She has constant fatigue and dizziness. She has no palpitations. She has no chest pain. She has been at her normal weight and going to dialysis as scheduled. Seems to be feeling more poorly on higher dose of amiodarone.   Primary Care Physician: Gerald  Hill Primary Cardiologist: Dr. Berry  Last Lipid Profile:Lipid Panel     Component Value Date/Time   CHOL 154 08/09/2014 1055   TRIG 90 08/09/2014 1055   HDL 57 08/09/2014 1055   CHOLHDL 2.7 08/09/2014 1055   VLDL 18 08/09/2014 1055   LDLCALC 79 08/09/2014 1055     Past Medical History  Diagnosis Date  . Anemia 03/06/2011  . Hypertension   . Coronary artery disease   . Aortic stenosis, mild     severe aortic stenosis  . A-fib   . Acute CHF   . Myocardial infarction 04/2013  . Renal disorder     dialysis  . Dialysis patient   . Hyperlipidemia     Past Surgical History  Procedure Laterality Date  . Av fistula placement      L arm  . Other surgical history Bilateral     Surgery on both arms.   . Fracture surgery  Bil. Arms  . Cardiac catheterization    . Pseudoaneurysm repair Right     leg  . Cataract extraction w/phaco Right 10/19/2013    Procedure: CATARACT EXTRACTION PHACO AND INTRAOCULAR LENS PLACEMENT (IOC);  Surgeon: Mark T. Shapiro, MD;  Location: [facility];  Service: Ophthalmology;  Laterality: Right;  CDE:15.66  . Cataract extraction w/phaco Left 11/02/2013    Procedure: CATARACT EXTRACTION PHACO AND INTRAOCULAR LENS PLACEMENT (IOC);  Surgeon: Mark T. Shapiro, MD;  Location: [facility];  Service: Ophthalmology;  Laterality: Left;  CDE:8.78  . Left and right heart catheterization with coronary angiogram N/A 05/11/2013    Procedure: LEFT AND RIGHT HEART CATHETERIZATION WITH CORONARY   ANGIOGRAM;  Surgeon: Jonathan J Berry, MD;  Location: [facility];  Service: Cardiovascular;  Laterality: N/A;  . Percutaneous coronary rotoblator intervention (pci-r) N/A 05/14/2013    Procedure: PERCUTANEOUS CORONARY ROTOBLATOR INTERVENTION (PCI-R);  Surgeon: Skelley A Kelly, MD;  Location: [facility];  Service: Cardiovascular;  Laterality: N/A;  . Percutaneous coronary rotoblator intervention (pci-r) N/A 05/19/2013    Procedure: PERCUTANEOUS CORONARY ROTOBLATOR INTERVENTION (PCI-R);  Surgeon:  Gibas A Kelly, MD;  Location: [facility];  Service: Cardiovascular;  Laterality: N/A;    Current Outpatient Prescriptions  Medication Sig Dispense Refill  . amiodarone (PACERONE) 200 MG tablet Take 1 tablet (200 mg total) by mouth daily. 30 tablet 5  . aspirin 81 MG EC tablet Take 81 mg by mouth daily.    . atorvastatin (LIPITOR) 40 MG tablet Take 40 mg by mouth daily.      . clopidogrel (PLAVIX) 75 MG tablet TAKE ONE (1) TABLET EACH DAY 30 tablet 11  . darbepoetin (ARANESP) 100 MCG/0.5ML SOLN injection Inject 100 mcg into the skin See admin instructions. Takes every Monday, Wednesday and Friday with dialysis    . doxercalciferol (HECTOROL) 4 MCG/2ML injection Inject 3.5 mcg into the vein every Monday, Wednesday, and Friday with hemodialysis.    . folic acid (FOLVITE) 1 MG tablet Take 1 mg by mouth daily.    . levofloxacin (LEVAQUIN) 500 MG tablet Take 1 tablet (500 mg total) by mouth every Monday, Wednesday, and Friday with hemodialysis. 2 tablet 0  . metoprolol tartrate (LOPRESSOR) 25 MG tablet TAKE ONE TABLET BY MOUTH TWICE A DAY 60 tablet 5  . multivitamin (RENA-VIT) TABS tablet Take 1 tablet by mouth daily.    . sevelamer carbonate (RENVELA) 800 MG tablet Take 1,600 mg by mouth 2 (two) times daily with a meal.     . sodium bicarbonate 325 MG tablet Take 325 mg by mouth 2 (two) times daily.    . warfarin (COUMADIN) 3 MG tablet Take 1.5-3 mg by mouth daily. Takes 3 mg on Sun, Tue, Wed, Thu, and Sat. Takes 1.5mg on Mon & Fri.     No current facility-administered medications for this visit.    No Known Allergies  History   Social History  . Marital Status: Married    Spouse Name: N/A  . Number of Children: N/A  . Years of Education: N/A   Occupational History  . Not on file.   Social History Main Topics  . Smoking status: Never Smoker   . Smokeless tobacco: Never Used  . Alcohol Use: No  . Drug Use: No  . Sexual Activity: Yes    Birth Control/ Protection: Post-menopausal     Other Topics Concern  . Not on file   Social History Narrative    Family History  Problem Relation Age of Onset  . Heart attack Sister     Review of Systems:  As stated in the HPI and otherwise negative.   BP 122/60 mmHg  Pulse 66  Ht 5' 4" (1.626 m)  Wt 116 lb 12.8 oz (52.98 kg)  BMI 20.04 kg/m2  Physical Examination: General: Thin elderly female. NAD HEENT: OP clear, mucus membranes moist SKIN: warm, dry. No rashes. Neuro: No focal deficits Musculoskeletal: Muscle strength 5/5 all ext Psychiatric: Mood and affect normal Neck: No JVD, transmitted sounds to right carotid, no thyromegaly, no lymphadenopathy. Lungs:Clear bilaterally, no wheezes, rhonci, crackles Cardiovascular: Regular rate and rhythm. Loud harsh systolic murmur. No gallops or rubs. Abdomen:Soft. Bowel sounds present.   Non-tender.  Extremities: No lower extremity edema.   Echo 08/18/14: Left ventricle: The cavity size was normal. There was mild focal basal hypertrophy of the septum. Systolic function was normal. The estimated ejection fraction was in the range of 60% to 65%. Wall motion was normal; there were no regional wall motion abnormalities. Doppler parameters are consistent with a reversible restrictive pattern, indicative of decreased left ventricular diastolic compliance and/or increased left atrial pressure (grade 3 diastolic dysfunction). Doppler parameters are consistent with both elevated ventricular end-diastolic filling pressure and elevated left atrial filling pressure. - Aortic valve: Trileaflet; severely thickened, severely calcified leaflets. Valve mobility was restricted. There was moderate to severe stenosis. Mean gradient (S): 33 mm Hg. Peak gradient (S): 55 mm Hg. Valve area (VTI): 0.7 cm^2. Valve area (Vmax): 0.73 cm^2. Valve area (Vmean): 0.65 cm^2. - Aortic root: The aortic root was normal in size. - Mitral valve: Calcified annulus. Severely  thickened, severely calcified leaflets . Mobility was restricted. The findings are consistent with mild to moderate stenosis. There was moderate regurgitation. Mean gradient (D): 5 mm Hg. Peak gradient (D): 14 mm Hg. Valve area by continuity equation (using LVOT flow): 1.3 cm^2. - Left atrium: The atrium was moderately to severely dilated. - Right ventricle: The cavity size was mildly dilated. Wall thickness was normal. Systolic function was mildly reduced. - Right atrium: The atrium was moderately dilated. - Tricuspid valve: There was mild regurgitation. - Pulmonary arteries: Systolic pressure was moderately increased. PA peak pressure: 51 mm Hg (S).  EKG:  EKG is not ordered today. The ekg ordered today demonstrates   Recent Labs: 10/12/2014: B Natriuretic Peptide >4500.0*; Magnesium 1.9 10/13/2014: ALT 9 10/15/2014: BUN 13; Creatinine 3.89*; Hemoglobin 9.7*; Platelets 371; Potassium 4.4; Sodium 134*     Wt Readings from Last 3 Encounters:  10/18/14 116 lb 12.8 oz (52.98 kg)  10/15/14 113 lb 12.1 oz (51.6 kg)  10/04/14 119 lb (53.978 kg)     Other studies Reviewed: Additional studies/ records that were reviewed today include: Prior office notes from Dr. Berry, echocardiogram, old cath films Review of the above records demonstrates:   STS Risk Score: Risk of Mortality: 21.923%  Morbidity or Mortality: 57.945%  Long Length of Stay: 39.164%  Short Length of Stay: 3.07%  Permanent Stroke: 7.205%  Prolonged Ventilation: 56.223%  DSW Infection: 0.41%  Renal Failure: N/A  Reoperation: 22.91%    Assessment and Plan:   1. Severe aortic valve stenosis: She has stage D severe symptomatic aortic stenosis. She is significantly limited by dyspnea and fatigue. This has progressed over the last 6 months. Her echocardiogram demonstrates severe aortic valve stenosis with heavy calcification of the valve leaflets and restricted mobility of the valve leaflets. Gradients across  the valve are elevated suggesting hemodynamically significant severe AS. She also has a severely calcified mitral valve with at least moderate mitral regurgitation and mild to moderate mitral stenosis. I think that she would benefit from aortic valve replacement. She does not appear to be a good candidate for traditional AVR given multiple comorbidities including end stage renal disease on hemodialysis, advanced age and poor overall functional status. Her STS risk score indicates a 21.9% risk of mortality with traditional AVR. She appears to be a good candidate for TAVR. I have reviewed the TAVR procedure at length today with the patient and her daughters. I have reviewed the etiology of aortic stenosis as well as normal disease progression. I have reviwed the TAVR procedure in detail and have shown her   heart models as well as the model of the aortic valve. She is not sure that she wishes to proceed but she would like to go ahead and arrange the cardiac cath to assess her pressures and CAD. I will plan a right and left heart cath on 10/25/14 at [facility]. Risks and benefits of the cath are reviewed today. She will hold coumadin 5 days before the cath. No bridging is needed. She will get pre-cath labs on Monday 10/24/14. If she agrees to proceed with the evaluation for TAVR following the cath, will arrange appt with Dr. Owen and Dr. Bartle. Will plan CT scans after her cath.   2. Atrial fibrillation, paroxysmal: Sinus today but more dizzy since amiodarone was increased. Will lower dose to 200 mg daily. Continue coumadin but hold for procedure.   3. End stage renal disease: Will plan dialysis on Monday 10/24/14 followed by cath on 10/25/14.   Current medicines are reviewed at length with the patient today.  The patient does not have concerns regarding medicines.  The following changes have been made:  Amiodarone reduced to 200 mg daily  Labs/ tests ordered today include:   Orders Placed This Encounter  Procedures    . CBC w/Diff  . INR/PT  . Basic Metabolic Panel (BMET)    Disposition:   Follow up will be the cardiac cath.  Signed, Reyce Lubeck, MD 10/18/2014 2:56 PM    [facility] [address], [place], [place]  [address] Phone: [phone]; Fax: [phone]    

## 2014-11-03 NOTE — Interval H&P Note (Signed)
History and Physical Interval Note:  11/03/2014 10:44 AM  [name]  has presented today for cardiac cath with the diagnosis of aortic valve stenosis/pre-TAVR. The various methods of treatment have been discussed with the patient and family. After consideration of risks, benefits and other options for treatment, the patient has consented to  Procedure(s): LEFT AND RIGHT HEART CATHETERIZATION WITH CORONARY ANGIOGRAM (N/A) as a surgical intervention .  The patient's history has been reviewed, patient examined, no change in status, stable for surgery.  I have reviewed the patient's chart and labs.  Questions were answered to the patient's satisfaction.    No PCI will be performed today.    [name]

## 2014-11-03 NOTE — Progress Notes (Signed)
ANTICOAGULATION CONSULT NOTE - Initial Consult  Pharmacy Consult for warfarin Indication: atrial fibrillation  No Known Allergies  Patient Measurements: Height: 5\' 4"  (162.6 cm) Weight: 117 lb (53.071 kg) IBW/kg (Calculated) : 54.7  Vital Signs: Temp: 97.7 F (36.5 C) (04/28 0759) Temp Source: Oral (04/28 0759) BP: 155/34 mmHg (04/28 1446) Pulse Rate: 62 (04/28 1459)  Labs:  Recent Labs  11/03/14 0815 11/03/14 0922  LABPROT 19.0*  --   INR 1.58*  --   CREATININE  --  4.34*    Estimated Creatinine Clearance: 8.1 mL/min (by C-G formula based on Cr of 4.34).  Assessment: 81 y/o female with ESRD on HD MWF admitted for cardiac cath. She takes chronic warfarin for Afib and INR was 1.5 on re-admit and is now 1.58. Patient is now status post cardiac cath and successful stenting of RCA to continue Plavix and resume warfarin. No cbc available this admission. No bleeding reported. Patient remains on home amiodarone.  PTA: 3 mg daily except 1.5 mg Mon/Fri, last dose 4/13 at home as held for procedure (off x2 weeks but still elevated INR).   Goal of Therapy:  INR 2-3 Monitor platelets by anticoagulation protocol: Yes   Plan:  - Resume warfarin at low dose of 1.5mg  po x1 tonight due to elevated of INR despite being off therapy for extended time period. - INR daily - Monitor for s/sx of bleeding  Link SnufferJessica Clinton Wahlberg, PharmD, BCPS Clinical Pharmacist [phone]  11/03/2014 5:14 PM

## 2014-11-04 ENCOUNTER — Encounter (HOSPITAL_COMMUNITY): Payer: Self-pay | Admitting: Cardiology

## 2014-11-04 DIAGNOSIS — T82858A Stenosis of vascular prosthetic devices, implants and grafts, initial encounter: Secondary | ICD-10-CM | POA: Diagnosis not present

## 2014-11-04 DIAGNOSIS — N2581 Secondary hyperparathyroidism of renal origin: Secondary | ICD-10-CM | POA: Diagnosis not present

## 2014-11-04 DIAGNOSIS — Z992 Dependence on renal dialysis: Secondary | ICD-10-CM | POA: Diagnosis not present

## 2014-11-04 DIAGNOSIS — I252 Old myocardial infarction: Secondary | ICD-10-CM | POA: Diagnosis not present

## 2014-11-04 DIAGNOSIS — N186 End stage renal disease: Secondary | ICD-10-CM | POA: Diagnosis not present

## 2014-11-04 DIAGNOSIS — Z7982 Long term (current) use of aspirin: Secondary | ICD-10-CM | POA: Diagnosis not present

## 2014-11-04 DIAGNOSIS — Z7901 Long term (current) use of anticoagulants: Secondary | ICD-10-CM | POA: Diagnosis not present

## 2014-11-04 DIAGNOSIS — D649 Anemia, unspecified: Secondary | ICD-10-CM | POA: Diagnosis not present

## 2014-11-04 DIAGNOSIS — I480 Paroxysmal atrial fibrillation: Secondary | ICD-10-CM | POA: Diagnosis not present

## 2014-11-04 DIAGNOSIS — I5032 Chronic diastolic (congestive) heart failure: Secondary | ICD-10-CM | POA: Diagnosis not present

## 2014-11-04 DIAGNOSIS — I120 Hypertensive chronic kidney disease with stage 5 chronic kidney disease or end stage renal disease: Secondary | ICD-10-CM | POA: Diagnosis not present

## 2014-11-04 DIAGNOSIS — D631 Anemia in chronic kidney disease: Secondary | ICD-10-CM | POA: Diagnosis not present

## 2014-11-04 DIAGNOSIS — I2510 Atherosclerotic heart disease of native coronary artery without angina pectoris: Secondary | ICD-10-CM | POA: Diagnosis not present

## 2014-11-04 DIAGNOSIS — Z7902 Long term (current) use of antithrombotics/antiplatelets: Secondary | ICD-10-CM | POA: Diagnosis not present

## 2014-11-04 DIAGNOSIS — I080 Rheumatic disorders of both mitral and aortic valves: Secondary | ICD-10-CM | POA: Diagnosis not present

## 2014-11-04 DIAGNOSIS — E785 Hyperlipidemia, unspecified: Secondary | ICD-10-CM | POA: Diagnosis not present

## 2014-11-04 DIAGNOSIS — I350 Nonrheumatic aortic (valve) stenosis: Secondary | ICD-10-CM

## 2014-11-04 DIAGNOSIS — Z79899 Other long term (current) drug therapy: Secondary | ICD-10-CM | POA: Diagnosis not present

## 2014-11-04 LAB — CBC
HEMATOCRIT: 30.3 % — AB (ref 36.0–46.0)
Hemoglobin: 9.4 g/dL — ABNORMAL LOW (ref 12.0–15.0)
MCH: 26.9 pg (ref 26.0–34.0)
MCHC: 31 g/dL (ref 30.0–36.0)
MCV: 86.6 fL (ref 78.0–100.0)
PLATELETS: 228 10*3/uL (ref 150–400)
RBC: 3.5 MIL/uL — AB (ref 3.87–5.11)
RDW: 19.7 % — AB (ref 11.5–15.5)
WBC: 16.3 10*3/uL — ABNORMAL HIGH (ref 4.0–10.5)

## 2014-11-04 LAB — BASIC METABOLIC PANEL
ANION GAP: 13 (ref 5–15)
BUN: 18 mg/dL (ref 6–23)
CO2: 29 mmol/L (ref 19–32)
CREATININE: 5.52 mg/dL — AB (ref 0.50–1.10)
Calcium: 9.3 mg/dL (ref 8.4–10.5)
Chloride: 94 mmol/L — ABNORMAL LOW (ref 96–112)
GFR, EST AFRICAN AMERICAN: 7 mL/min — AB (ref >90)
GFR, EST NON AFRICAN AMERICAN: 6 mL/min — AB (ref >90)
Glucose, Bld: 67 mg/dL — ABNORMAL LOW (ref 70–99)
Potassium: 3.9 mmol/L (ref 3.5–5.1)
Sodium: 136 mmol/L (ref 135–145)

## 2014-11-04 LAB — PROTIME-INR
INR: 2.29 — ABNORMAL HIGH (ref 0.00–1.49)
Prothrombin Time: 25.4 seconds — ABNORMAL HIGH (ref 11.6–15.2)

## 2014-11-04 MED ORDER — SODIUM CHLORIDE 0.9 % IV SOLN: 100.0000 mL | INTRAVENOUS | Status: DC | PRN

## 2014-11-04 MED ORDER — WARFARIN 0.5 MG HALF TABLET
0.5000 mg | ORAL_TABLET | Freq: Once | ORAL | Status: AC
  Administered 2014-11-04: 0.5 mg via ORAL
  Filled 2014-11-04: qty 1

## 2014-11-04 MED ORDER — ALTEPLASE 2 MG IJ SOLR
2.0000 mg | Freq: Once | INTRAMUSCULAR | Status: DC | PRN
  Filled 2014-11-04: qty 2

## 2014-11-04 MED ORDER — HEPARIN SODIUM (PORCINE) 1000 UNIT/ML DIALYSIS
1000.0000 [IU] | INTRAMUSCULAR | Status: DC | PRN
  Filled 2014-11-04: qty 1

## 2014-11-04 MED ORDER — LIDOCAINE HCL (PF) 1 % IJ SOLN: 5.0000 mL | INTRAMUSCULAR | Status: DC | PRN

## 2014-11-04 MED ORDER — LIDOCAINE-PRILOCAINE 2.5-2.5 % EX CREA
1.0000 "application " | TOPICAL_CREAM | CUTANEOUS | Status: DC | PRN
  Filled 2014-11-04: qty 5

## 2014-11-04 MED ORDER — NITROGLYCERIN 0.4 MG SL SUBL: 0.4000 mg | SUBLINGUAL_TABLET | SUBLINGUAL | Status: AC | PRN

## 2014-11-04 MED ORDER — ACETAMINOPHEN 325 MG PO TABS: 650.0000 mg | ORAL_TABLET | ORAL | Status: DC | PRN

## 2014-11-04 MED ORDER — NEPRO/CARBSTEADY PO LIQD
237.0000 mL | ORAL | Status: DC | PRN
  Filled 2014-11-04: qty 237

## 2014-11-04 MED ORDER — WARFARIN SODIUM 3 MG PO TABS: 1.5000 mg | ORAL_TABLET | Freq: Every day | ORAL | Status: DC

## 2014-11-04 MED ORDER — PENTAFLUOROPROP-TETRAFLUOROETH EX AERO
1.0000 "application " | INHALATION_SPRAY | CUTANEOUS | Status: DC | PRN
  Filled 2014-11-04: qty 30

## 2014-11-04 MED FILL — Sodium Chloride IV Soln 0.9%: INTRAVENOUS | Qty: 50 | Status: AC

## 2014-11-04 NOTE — Procedures (Signed)
I was present at this dialysis session. I have reviewed the session itself and made appropriate changes.   Sabra Heckyan Sanford  MD 11/04/2014, 1:29 PM

## 2014-11-04 NOTE — Consult Note (Signed)
[name] 11/04/2014 Arita Miss Requesting Physician:  Clifton James  Reason for Consult:  ESRD Comanagement HPI:  40F ESRD MWF [facility] s/p LHC/RHC yesterday with PCI to RCA.  No recent HD issues.  Uses L UE AVF w/ no difficulties.  Today w/o CP, SOB, LEE, N/V.      Filed Weights   11/03/14 0759 11/04/14 0500  Weight: 53.071 kg (117 lb) 53.5 kg (117 lb 15.1 oz)    I/O last 3 completed shifts: In: 240 [P.O.:240] Out: -   ROS Balance of 12 systems is negative w/ exceptions as above  Outpt HD Orders Unit: [facility] Days: MWF Time: 3h Dialyzer: F180 EDW: 53kg K/Ca: 2/2.5 Access: LUE AVF Needle Size: 15g a/v BFR/DFR: 400/600 UF Proflie: n/a VDRA: Hectorol 4qTx EPO: Epo 8000 qTx IV Fe: ? Heparin: none  PMH  Past Medical History  Diagnosis Date  . Anemia 03/06/2011  . Hypertension   . Coronary artery disease     PCI 2014, 2016  . Aortic stenosis, mild     severe aortic stenosis  . A-fib   . Acute CHF   . Hyperlipidemia   . Myocardial infarction 2014  . Heart murmur   . Pneumonia 10/2014  . Anxiety   . ESRD (end stage renal disease) on dialysis     "MWF; [facility]; [place]" (11/03/2014)   PSH  Past Surgical History  Procedure Laterality Date  . Av fistula placement Left ~ 2012    upper arm arm  . Forearm fracture surgery Bilateral     "fell in the basement"  . Fracture surgery    . Pseudoaneurysm repair Right 05/2013    leg  . Cataract extraction w/phaco Right 10/19/2013    Procedure: CATARACT EXTRACTION PHACO AND INTRAOCULAR LENS PLACEMENT (IOC);  Surgeon: Loraine Leriche T. Nile Riggs, MD;  Location: [facility];  Service: Ophthalmology;  Laterality: Right;  CDE:15.66  . Cataract extraction w/phaco Left 11/02/2013    Procedure: CATARACT EXTRACTION PHACO AND INTRAOCULAR LENS PLACEMENT (IOC);  Surgeon: Loraine Leriche T. Nile Riggs, MD;  Location: [facility];  Service: Ophthalmology;  Laterality: Left;  CDE:8.78  . Left and right heart catheterization with coronary angiogram  N/A 05/11/2013    Procedure: LEFT AND RIGHT HEART CATHETERIZATION WITH CORONARY ANGIOGRAM;  Surgeon: Runell Gess, MD;  Location: [facility] CATH LAB;  Service: Cardiovascular;  Laterality: N/A;  . Percutaneous coronary rotoblator intervention (pci-r) N/A 05/14/2013    Procedure: PERCUTANEOUS CORONARY ROTOBLATOR INTERVENTION (PCI-R);  Surgeon: Lennette Bihari, MD;  Location: [facility] CATH LAB;  Service: Cardiovascular;  Laterality: N/A;  . Percutaneous coronary rotoblator intervention (pci-r) N/A 05/19/2013    Procedure: PERCUTANEOUS CORONARY ROTOBLATOR INTERVENTION (PCI-R);  Surgeon: Lennette Bihari, MD;  Location: [facility] CATH LAB;  Service: Cardiovascular;  Laterality: N/A;  . Cardiac catheterization  ; 11/03/2014  . Coronary angioplasty with stent placement    . Cataract extraction w/ intraocular lens  implant, bilateral Bilateral 2015  . Left and right heart catheterization with coronary angiogram N/A 11/03/2014    Procedure: LEFT AND RIGHT HEART CATHETERIZATION WITH CORONARY ANGIOGRAM;  Surgeon: Kathleene Hazel, MD;  Location: [facility] CATH LAB;  Service: Cardiovascular;  Laterality: N/A;   FH  Family History  Problem Relation Age of Onset  . Heart attack Sister    [facility]  reports that she has never smoked. She has never used smokeless tobacco. She reports that she does not drink alcohol or use illicit drugs. Allergies No Known Allergies Home medications Prior to Admission medications  Medication Sig Start Date End Date Taking? Authorizing Provider  ALPRAZolam (XANAX) 0.25 MG tablet Take 1 tablet (0.25 mg total) by mouth 2 (two) times daily as needed for anxiety. 10/20/14  Yes Kathleene Hazel, MD  amiodarone (PACERONE) 200 MG tablet Take 1 tablet (200 mg total) by mouth daily. 10/18/14  Yes Kathleene Hazel, MD  aspirin 81 MG EC tablet Take 81 mg by mouth daily. 05/22/13  Yes Lonia Blood, MD  atorvastatin (LIPITOR) 40 MG tablet Take 40 mg by mouth daily.     Yes Historical Provider, MD   clopidogrel (PLAVIX) 75 MG tablet TAKE ONE (1) TABLET EACH DAY 09/01/14  Yes Runell Gess, MD  darbepoetin (ARANESP) 100 MCG/0.5ML SOLN injection Inject 100 mcg into the skin See admin instructions. Takes every Monday, Wednesday and Friday with dialysis   Yes Historical Provider, MD  doxercalciferol (HECTOROL) 4 MCG/2ML injection Inject 3.5 mcg into the vein every Monday, Wednesday, and Friday with hemodialysis.   Yes Historical Provider, MD  folic acid (FOLVITE) 1 MG tablet Take 1 mg by mouth daily.   Yes Historical Provider, MD  metoprolol tartrate (LOPRESSOR) 25 MG tablet TAKE ONE TABLET BY MOUTH TWICE A DAY 03/11/14  Yes Chrystie Nose, MD  multivitamin (RENA-VIT) TABS tablet Take 1 tablet by mouth daily.   Yes Historical Provider, MD  sevelamer carbonate (RENVELA) 800 MG tablet Take 1,600 mg by mouth 2 (two) times daily with a meal.    Yes Historical Provider, MD  sodium bicarbonate 325 MG tablet Take 325 mg by mouth 2 (two) times daily.   Yes Historical Provider, MD  traMADol (ULTRAM) 50 MG tablet Take 50 mg by mouth every 6 (six) hours as needed.   Yes Historical Provider, MD  warfarin (COUMADIN) 3 MG tablet Take 1.5-3 mg by mouth daily. Takes 3 mg on Sun, Tue, Wed, Thu, and Sat. Takes 1.5mg  on Mon & Fri. 06/08/13  Yes Rosalee Kaufman, RPH-CPP    Current Medications Scheduled Meds: . amiodarone  200 mg Oral Daily  . atorvastatin  40 mg Oral Daily  . clopidogrel  75 mg Oral Daily  . feeding supplement (ENSURE ENLIVE)  237 mL Oral BID BM  . folic acid  1 mg Oral Daily  . metoprolol tartrate  25 mg Oral BID  . multivitamin  1 tablet Oral Daily  . sevelamer carbonate  1,600 mg Oral BID WC  . sodium bicarbonate  325 mg Oral BID  . Warfarin - Pharmacist Dosing Inpatient   Does not apply q1800   Continuous Infusions:  PRN Meds:.acetaminophen, ALPRAZolam, ondansetron (ZOFRAN) IV, traMADol  CBC  Recent Labs Lab 11/04/14 0520  WBC 16.3*  HGB 9.4*  HCT 30.3*  MCV 86.6  PLT 228    Basic Metabolic Panel  Recent Labs Lab 11/03/14 0922 11/04/14 0520  NA 137 136  K 3.1* 3.9  CL 97 94*  CO2 30 29  GLUCOSE 82 67*  BUN 8 18  CREATININE 4.34* 5.52*  CALCIUM 9.5 9.3    Physical Exam  Blood pressure 156/89, pulse 60, temperature 97.9 F (36.6 C), temperature source Oral, resp. rate 20, height  (1.626 m), weight 53.5 kg (117 lb 15.1 oz), SpO2 96 %. GEN: NAD, lying in bed ENT: NCAT EYES: EOMI CV: 3/6 Nealry loud MSM best heard at RUSB PULM: ctab ABD: s/nt/nd SKIN: no rashes/lesion EXT:no LEE AV: LUE AVF +B/T   A/P 1. ESRD:  1. MWF [facility] 2. Outpt Tx records  requested 3. Maintenance HD today 4. DC today post HD pending 2. HTN/Vol:  1. Used outpt EDW -- 53kg 2. Inpt BP stable 3. Anemia:  1. Will resume management as outpt 4. MBD: No inpatient issues  Sabra Heckyan Avaley Coop MD 11/04/2014, 8:02 AM

## 2014-11-04 NOTE — Progress Notes (Signed)
ANTICOAGULATION CONSULT NOTE  Pharmacy Consult for warfarin Indication: atrial fibrillation  No Known Allergies  Patient Measurements: Height: 5\' 4"  (162.6 cm) Weight: 117 lb 15.1 oz (53.5 kg) IBW/kg (Calculated) : 54.7  Vital Signs: Temp: 97.9 F (36.6 C) (04/29 0743) Temp Source: Oral (04/29 0743) BP: 156/89 mmHg (04/29 0743) Pulse Rate: 60 (04/29 0743)  Labs:  Recent Labs  11/03/14 0815 11/03/14 0922 11/04/14 0520  HGB  --   --  9.4*  HCT  --   --  30.3*  PLT  --   --  228  LABPROT 19.0*  --  25.4*  INR 1.58*  --  2.29*  CREATININE  --  4.34* 5.52*    Estimated Creatinine Clearance: 6.4 mL/min (by C-G formula based on Cr of 5.52).  Assessment: 81 y/o female with ESRD on HD MWF admitted for cardiac cath. She takes chronic warfarin for Afib and INR was 1.5 on re-admit and is was 1.58 yesterday even though coumadin was held for weeks.  Patient is now status post cardiac cath and successful stenting of RCA to continue Plavix and resume warfarin.No bleeding reported. Patient remains on home amiodarone. Now therapeutic at 2.29 after only 1 dose of 1.5 mg . Fairly large 6.4 second jump in protime.   PTA: 3 mg daily except 1.5 mg Mon/Fri, last dose 4/13 at home as held for procedure (off x2 weeks but still elevated INR).   Goal of Therapy:  INR 2-3 Monitor platelets by anticoagulation protocol: Yes   Plan:  -  Coumadin 0.5 mg po x 1 dose -recommend to discharge on lower dose warfarin due to elevated of INR despite being off therapy for extended time period. - INR daily - Monitor for s/sx of bleeding  Herby AbrahamMichelle T. Jaidalyn Schillo, Pharm.D. [phone] 11/04/2014 11:42 AM

## 2014-11-04 NOTE — Progress Notes (Signed)
    Subjective:  No complaints  Objective:  Vital Signs in the last 24 hours: Temp:  [97.5 F (36.4 C)-97.9 F (36.6 C)] 97.9 F (36.6 C) (04/29 0400) Pulse Rate:  [57-64] 57 (04/29 0400) Resp:  [0-22] 20 (04/29 0400) BP: (134-165)/(30-47) 144/40 mmHg (04/29 0400) SpO2:  [94 %-100 %] 94 % (04/29 0400) Weight:  [117 lb (53.071 kg)-117 lb 15.1 oz (53.5 kg)] 117 lb 15.1 oz (53.5 kg) (04/29 0500)  Intake/Output from previous day:  Intake/Output Summary (Last 24 hours) at 11/04/14 0702 Last data filed at 11/03/14 1700  Gross per 24 hour  Intake    240 ml  Output      0 ml  Net    240 ml    Physical Exam: General appearance: alert, cooperative and no distress Lungs: clear to auscultation bilaterally Heart: regular rate and rhythm 3/6 systolic murmur Extremities: oozing Rt groin   Rate: 60  Rhythm: normal sinus rhythm and sinus bradycardia  Lab Results: No results for input(s): WBC, HGB, PLT in the last 72 hours.  Recent Labs  11/03/14 0922  NA 137  K 3.1*  CL 97  CO2 30  GLUCOSE 82  BUN 8  CREATININE 4.34*   No results for input(s): TROPONINI in the last 72 hours.  Invalid input(s): CK, MB  Recent Labs  11/03/14 0815  INR 1.58*    Scheduled Meds: . amiodarone  200 mg Oral Daily  . atorvastatin  40 mg Oral Daily  . clopidogrel  75 mg Oral Daily  . feeding supplement (ENSURE ENLIVE)  237 mL Oral BID BM  . folic acid  1 mg Oral Daily  . metoprolol tartrate  25 mg Oral BID  . multivitamin  1 tablet Oral Daily  . sevelamer carbonate  1,600 mg Oral BID WC  . sodium bicarbonate  325 mg Oral BID  . Warfarin - Pharmacist Dosing Inpatient   Does not apply q1800   Continuous Infusions:  PRN Meds:.acetaminophen, ALPRAZolam, ondansetron (ZOFRAN) IV, traMADol   Imaging: Imaging results have been reviewed  Cardiac Studies:  Assessment/Plan:   Active Problems:   CAD- RCA HSRA/DES 05/15/13-  CFX/OM1 DES 05/19/13- RCA ISR/ PTCA 11/03/14   Severe aortic  stenosis   ESRD (end stage renal disease) on dialysis   Anemia   HTN (hypertension)   Long term current use of anticoagulant therapy   Dyslipidemia   PLAN: Dialysis today- I will notify renal service this am.  Corine ShelterLuke Kilroy PA-C 11/04/2014, 7:02 AM   Patient seen and examined and history reviewed. Agree with above findings and plan. Doing well post PCI. Some oozing from groin site but no hematoma. Dressing changed and is clean. Rhythm stable. Will continue Plavix and resume Coumadin. Need to address dialysis today then plan DC later today. Follow up with [facility] for consideration of TAVR.  Facundo Allemand [place], MDFACC 11/04/2014 7:06 AM

## 2014-11-04 NOTE — Progress Notes (Signed)
CARDIAC REHAB PHASE I   PRE:  Rate/Rhythm: 61 SR  BP:  Supine: 156/89  Sitting:   Standing:    SaO2: 96%RA  MODE:  Ambulation: 60 ft   POST:  Rate/Rhythm: 65 SR  BP:  Supine: 155/35  Sitting:   Standing:    SaO2: 94%RA 0800-0855 Pt hesitant to get up since groin had bled during the night. RN stated pressure dressing on and ok to walk. Reassured pt that needed to get up and make sure not going to bleed before discharge today. Checked dressing three times, once before and twice after walk with no bleeding. Pt does not walk much per daughter, mainly in house. Encouraged pt to walk as tolerated with walker, not appropriate for CRP 2 due to mobility issues and HD on MWF. Pt walked 60 ft with gait belt use and rolling walker. No dizziness or CP or right groin pain. To bed after walk at pt's request. Has been on plavix before per daughter.   Luetta Nuttingharlene Annie Saephan, RN BSN  11/04/2014 8:52 AM

## 2014-11-04 NOTE — Discharge Summary (Signed)
Patient ID: [name],  MRN: [id number], DOB/AGE: [birth year] 79 y.o.  Admit date: 11/03/2014 Discharge date: 11/04/2014  Primary Care Provider: [name], MD Primary Cardiologist: Dr Allyson Sabal  Discharge Diagnoses Principal Problem:   Severe aortic stenosis Active Problems:   CAD- RCA HSRA/DES 05/15/13-  CFX/OM1 DES 05/19/13- RCA ISR/ PTCA 11/03/14   ESRD (end stage renal disease) on dialysis   PAF (paroxysmal atrial fibrillation)- Amiodarone - NSR   Anemia   HTN (hypertension)   Long term current use of anticoagulant therapy   Dyslipidemia    Procedures: Rt and Lt heart and subsequent PCI 11/03/14   Hospital Course:  81 year old on hemodialysis with end-stage renal disease, severe aortic stenosis with recent echocardiogram showing valve area of 0.7 cm with admission for acute respiratory failure She was recently seen on 10/04/14 by Dr. Allyson Sabal in the outpatient setting and he was referring her to Dr. Excell Seltzer for further evaluation of possible TAVR.          On 10/13/14 to 10/15/14 she was admitted with shortness of breath. This had been intermittent over the past 2 weeks and have been associated with nonproductive cough over the past 2-3 days. In the emergency department she was found to be in atrial fibrillation, which is known per past medical history, with rapid ventricular response and CT of chest was performed without contrast and showed pulmonary edema. She has a left AV fistula. Her medications included amiodarone, aspirin, clopidogrel, warfarin. Ejection fraction 60-65% with significant diastolic dysfunction, grade 3 Feb 11 th 2016. Her last catheterization was on 05/19/13 by Dr. Nicki Guadalajara. This was performed after her catheterization on November 7 where she underwent a difficult but successful complex intervention to a severely calcified subtotal proximal and mid right coronary artery. She also had severe concomitant CAD of her circumflex vessel with total.              Pt  was admitted 11/03/14 for Rt and Lt heart cath as part of her TAVR evaluation.  She has severe AS and had ISR  Of her RCA. She underwent HSRA of her RCA. Post op she had some oozing from her Rt FA site and pressure was held. She was dialyzed before discharge. She has a f/u with Dr Laneta Simmers about TAVR. At the pharmacist recommendation we cut her Coumadin back at discharge. She is followed by Dr Fausto Skillern in [place] and has been instructed to have INR checked in [place] next week.   Discharge Vitals:  Blood pressure 156/89, pulse 60, temperature 97.9 F (36.6 C), temperature source Oral, resp. rate 20, height  (1.626 m), weight 117 lb 15.1 oz (53.5 kg), SpO2 96 %.    Labs: Results for orders placed or performed during the hospital encounter of 11/03/14 (from the past 24 hour(s))  POCT Activated clotting time     Status: None   Collection Time: 11/03/14 11:37 AM  Result Value Ref Range   Activated Clotting Time 417 seconds  Basic metabolic panel     Status: Abnormal   Collection Time: 11/04/14  5:20 AM  Result Value Ref Range   Sodium 136 135 - 145 mmol/L   Potassium 3.9 3.5 - 5.1 mmol/L   Chloride 94 (L) 96 - 112 mmol/L   CO2 29 19 - 32 mmol/L   Glucose, Bld 67 (L) 70 - 99 mg/dL   BUN 18 6 - 23 mg/dL   Creatinine, Ser 4.09 (H) 0.50 - 1.10 mg/dL   Calcium  9.3 8.4 - 10.5 mg/dL   GFR calc non Af Amer 6 (L) >90 mL/min   GFR calc Af Amer 7 (L) >90 mL/min   Anion gap 13 5 - 15  CBC     Status: Abnormal   Collection Time: 11/04/14  5:20 AM  Result Value Ref Range   WBC 16.3 (H) 4.0 - 10.5 K/uL   RBC 3.50 (L) 3.87 - 5.11 MIL/uL   Hemoglobin 9.4 (L) 12.0 - 15.0 g/dL   HCT 32.3 (L) 55.7 - 32.2 %   MCV 86.6 78.0 - 100.0 fL   MCH 26.9 26.0 - 34.0 pg   MCHC 31.0 30.0 - 36.0 g/dL   RDW 02.5 (H) 42.7 - 06.2 %   Platelets 228 150 - 400 K/uL  Protime-INR     Status: Abnormal   Collection Time: 11/04/14  5:20 AM  Result Value Ref Range   Prothrombin Time 25.4 (H) 11.6 - 15.2 seconds    INR 2.29 (H) 0.00 - 1.49    Disposition:      Follow-up Information    Follow up with Alleen Borne, MD On 11/09/2014.   Specialty:  Cardiothoracic Surgery   Why:  2:30 pm   Contact information:   [address] [employer] [address] [place] [address] [phone]       Discharge Medications:    Medication List    STOP taking these medications        aspirin 81 MG EC tablet      TAKE these medications        acetaminophen 325 MG tablet  Commonly known as:  TYLENOL  Take 2 tablets (650 mg total) by mouth every 4 (four) hours as needed for headache or mild pain.     ALPRAZolam 0.25 MG tablet  Commonly known as:  XANAX  Take 1 tablet (0.25 mg total) by mouth 2 (two) times daily as needed for anxiety.     amiodarone 200 MG tablet  Commonly known as:  PACERONE  Take 1 tablet (200 mg total) by mouth daily.     atorvastatin 40 MG tablet  Commonly known as:  LIPITOR  Take 40 mg by mouth daily.     clopidogrel 75 MG tablet  Commonly known as:  PLAVIX  TAKE ONE (1) TABLET EACH DAY     darbepoetin 100 MCG/0.5ML Soln injection  Commonly known as:  ARANESP  Inject 100 mcg into the skin See admin instructions. Takes every Monday, Wednesday and Friday with dialysis     doxercalciferol 4 MCG/2ML injection  Commonly known as:  HECTOROL  Inject 3.5 mcg into the vein every Monday, Wednesday, and Friday with hemodialysis.     folic acid 1 MG tablet  Commonly known as:  FOLVITE  Take 1 mg by mouth daily.     metoprolol tartrate 25 MG tablet  Commonly known as:  LOPRESSOR  TAKE ONE TABLET BY MOUTH TWICE A DAY     multivitamin Tabs tablet  Take 1 tablet by mouth daily.     nitroGLYCERIN 0.4 MG SL tablet  Commonly known as:  NITROSTAT  Place 1 tablet (0.4 mg total) under the tongue every 5 (five) minutes as needed for chest pain.     sevelamer carbonate 800 MG tablet  Commonly known as:  RENVELA  Take 1,600 mg by mouth 2 (two) times daily with a meal.     sodium  bicarbonate 325 MG tablet  Take 325 mg by mouth 2 (two) times daily.  traMADol 50 MG tablet  Commonly known as:  ULTRAM  Take 50 mg by mouth every 6 (six) hours as needed.     warfarin 3 MG tablet  Commonly known as:  COUMADIN  Take 1.5-3 mg by mouth daily. Takes 3 mg on Sun, Tue, Wed, Thu, and Sat. Takes 1.5mg  on Mon & Fri.         Duration of Discharge Encounter: Greater than 30 minutes including physician time.  Jolene ProvostSigned, Jacquees Gongora PA-C 11/04/2014 11:36 AM

## 2014-11-04 NOTE — Progress Notes (Addendum)
Initial Nutrition Assessment  DOCUMENTATION CODES:  Not applicable  INTERVENTION:  Other (Comment) (Encourage PO)  NUTRITION DIAGNOSIS:  Inadequate oral intake related to other (see comment) (lack of appetite) as evidenced by meal completion < 50%.  GOAL:  Patient will meet greater than or equal to 90% of their needs  MONITOR:  PO intake, Labs, Weight trends, I & O's  REASON FOR ASSESSMENT:  Malnutrition Screening Tool    ASSESSMENT: Pt is a 81 y.o. female with history of ESRD on hemodialysis on MWF, recent echocardiogram done in 2016 showing severe aortic stenosis, CAD status post stenting, paroxysmal atrial fibrillation on Coumadin and amiodarone and metoprolol, chronic anemia, diastolic CHF presents to the ER because of worsening shortness of breath.  Pt's daughters at bedside, pt asleep at time of visit. Pt has not been eating well ever since start of dialysis about 3 years ago. Pt lives with one of her daughters and family members visit often. Everybody is concerned about pt's poor PO and run to get or cook whatever she asks for, but usually she just takes a bite or two and says she is no longer hungry. Pt often gets upset when family even mentions food. Pt does not like to drink much of anything and refuses any supplements (daughter states there are boxes of Boost and Ensure at home - she will not touch it). Provided some suggestions to increase calories for things she will eat. Pt woke up before the end of visit, but refused NFPE.  Labs reviewed: Glu 67, Cr 5.52 Height:  Ht Readings from Last 1 Encounters:  11/03/14 5\' 4"  (1.626 m)    Weight:  Wt Readings from Last 1 Encounters:  11/04/14 118 lb 13.3 oz (53.9 kg)    Ideal Body Weight:  54.55 kg  Wt Readings from Last 10 Encounters:  11/04/14 118 lb 13.3 oz (53.9 kg)  10/18/14 116 lb 12.8 oz (52.98 kg)  10/15/14 113 lb 12.1 oz (51.6 kg)  10/04/14 119 lb (53.978 kg)  08/09/14 122 lb 3.2 oz (55.43 kg)  06/14/13  122 lb (55.339 kg)  06/05/13 117 lb 15.1 oz (53.5 kg)  05/28/13 117 lb (53.071 kg)  05/21/13 122 lb 5.7 oz (55.5 kg)  05/05/13 117 lb (53.071 kg)    BMI:  Body mass index is 20.39 kg/(m^2).  Estimated Nutritional Needs:  Kcal:  1350 - 1500  Protein:  65 - 75 g  Fluid:  1.2 L  Skin:  Reviewed, no issues  Diet Order:  Diet Heart Room service appropriate?: Yes; Fluid consistency:: Thin  EDUCATION NEEDS:  No education needs identified at this time   Intake/Output Summary (Last 24 hours) at 11/04/14 1429 Last data filed at 11/04/14 0745  Gross per 24 hour  Intake    340 ml  Output      0 ml  Net    340 ml    Last BM:  PTA  Elige Shouse A. [facility] Hospital[facility] Dietetic Intern Pager: [phone] 11/04/2014 2:29 PM

## 2014-11-04 NOTE — Discharge Instructions (Signed)
Arteriogram  An arteriogram (or angiogram) is an X-ray test of your blood vessels. You will be awake during the test. This test looks for:  Blocked blood vessels.  Blood vessels that are not normal. BEFORE THE TEST Schedule an appointment for your arteriogram. Let the person know if:  You have diabetes.  You are allergic to any food or medicine.  You are allergic to X-ray dye (contrast).  You have asthma or had it when you were a child.  You are pregnant or you might be pregnant.  You are taking aspirin or blood thinners. The night before the test:   Do not  eat or drink anything after midnight. On the morning of your test:   Do not drive. Have someone bring you and take you home.  Bring all the medicines you need to take with you. If you have diabetes, do not take your insulin before the test, but bring it with you. THE TEST  You will lie on the X-ray table.  An IV tube is started in your arm.  Your blood pressure and heartbeat are checked during the test.  The upper part of your leg (groin) is shaved and washed with special soap.  You are then covered with a germ free (sterile) sheet. Keep your arms at your side under the sheet at all times so that germs do not get on the sheet.  The skin is numbed where the thin tube (catheter) is put into your upper leg. The thin tube is moved up into the blood vessels that your doctor wants to see.  Dye is put in through the tube. You may have a feeling of heat as the dye moves into your body.  X-ray pictures of your blood vessels are taken. Do not move while the dye goes in and pictures are taken. AFTER THE TEST  The thin tube will be taken out of your upper leg.  The nurse will check:  Your blood pressure.  The place where the thin tube was taken out to make sure it is not bleeding.  The blood flow to your feet.  Lie flat in bed. Keep your leg straight for 6 hours after the thin tube is taken out.  Ask your doctor or  nurse if you should take your regular medications that you brought. Finding out the results of your test Ask when your test results will be ready. Make sure you get your test results. Document Released: 09/20/2008 Document Revised: 06/29/2013 Document Reviewed: 09/20/2008 [facility] Patient Information 2015 [place], [place]. This information is not intended to replace advice given to you by your health care provider. Make sure you discuss any questions you have with your health care provider.  Have your Coumadin level checked next week.

## 2014-11-05 DIAGNOSIS — N186 End stage renal disease: Secondary | ICD-10-CM | POA: Diagnosis not present

## 2014-11-05 DIAGNOSIS — Z992 Dependence on renal dialysis: Secondary | ICD-10-CM | POA: Diagnosis not present

## 2014-11-07 DIAGNOSIS — N2581 Secondary hyperparathyroidism of renal origin: Secondary | ICD-10-CM | POA: Diagnosis not present

## 2014-11-07 DIAGNOSIS — Z992 Dependence on renal dialysis: Secondary | ICD-10-CM | POA: Diagnosis not present

## 2014-11-07 DIAGNOSIS — D631 Anemia in chronic kidney disease: Secondary | ICD-10-CM | POA: Diagnosis not present

## 2014-11-07 DIAGNOSIS — N186 End stage renal disease: Secondary | ICD-10-CM | POA: Diagnosis not present

## 2014-11-09 ENCOUNTER — Institutional Professional Consult (permissible substitution) (INDEPENDENT_AMBULATORY_CARE_PROVIDER_SITE_OTHER): Payer: Medicare Other | Admitting: Surgery

## 2014-11-09 ENCOUNTER — Encounter: Payer: Self-pay | Admitting: Surgery

## 2014-11-09 VITALS — BP 108/52 | HR 56 | Resp 20 | Ht 64.0 in | Wt 117.0 lb

## 2014-11-09 DIAGNOSIS — D631 Anemia in chronic kidney disease: Secondary | ICD-10-CM | POA: Diagnosis not present

## 2014-11-09 DIAGNOSIS — I350 Nonrheumatic aortic (valve) stenosis: Secondary | ICD-10-CM | POA: Diagnosis not present

## 2014-11-09 DIAGNOSIS — N186 End stage renal disease: Secondary | ICD-10-CM | POA: Diagnosis not present

## 2014-11-09 DIAGNOSIS — Z992 Dependence on renal dialysis: Secondary | ICD-10-CM | POA: Diagnosis not present

## 2014-11-09 DIAGNOSIS — N2581 Secondary hyperparathyroidism of renal origin: Secondary | ICD-10-CM | POA: Diagnosis not present

## 2014-11-09 NOTE — Progress Notes (Signed)
Patient ID: [name], female   DOB: 1927/10/30, 81 y.o.   MRN: [id number]  [facility]  MULTIDISCIPLINARY HEART VALVE CLINIC    CARDIOTHORACIC SURGERY CONSULTATION REPORT  Referring Provider is Kathleene Hazel* PCP is [name], MD  Chief Complaint  Patient presents with  . Aortic Stenosis    Surgical eval for TAVR, 2D ECHO 08/18/14, Cardiac Cath 11/03/14    HPI:  The patient is an 81 year old woman with end stage kidney disease on hemodialysis for the past 4 years, CAD s/p PCI and stenting of the RCA and LCX in 2014, HTN, hyperlipidemia, atrial fibrillation on coumadin and chronic diastolic heart failure and aortic stenosis. She has been followed by Dr. Allyson Sabal and had an echo on 08/18/2014 showing normal LVEF of 60-65% with a mean gradient across the aortic valve of 33 mm Hg and a peak gradient of 55 mm Hg. The AVA is 0.65 - 0.73 cm2. Her previous echo in 05/2013 showed a mean gradient of 17 mm Hg with an AVA of 1.2 cm2. She was then admittted to [facility] 10/13/14 with volume overload, pulmonary edema and shortness of breath and was found to be in atrial fibrillation with rapid ventricular response. Her amiodarone was increased and she returned to normal sinus rhythm. Volume overload was managed with dialysis. She subsequently underwent R and L heart cath on 11/03/2014 which showed a mean AV gradient of 17 mm Hg and a peak to peak gradient of 17 mm Hg. The AVA was calculated at 1.33 cm2. Coronary angiography showed a 95% restenosis in the ostial and proximal stented segment of the RCA. There are moderate stenoses in the LAD, LCX and PDA/PL. 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  She is here with one of her daughters today. She says she feels the same as she always does but her daughter says she has been doing poorly for a long time. She sleeps all day, refuses to eat and can't get up  out of a chair without help. She only walks a very short distance in her house and mainly uses a wheelchair to go to dialysis. She does not leave the house for any other activities. She denies chest pain but gets short of breath and fatigued with with any exertion.  Past Medical History  Diagnosis Date  . Anemia 03/06/2011  . Hypertension   . Coronary artery disease     PCI 2014, 2016  . Aortic stenosis, mild     severe aortic stenosis  . A-fib   . Acute CHF   . Hyperlipidemia   . Myocardial infarction 2014  . Heart murmur   . Pneumonia 10/2014  . Anxiety   . ESRD (end stage renal disease) on dialysis     "MWF; [facility]; [place]" (11/03/2014)    Past Surgical History  Procedure Laterality Date  . Av fistula placement Left ~ 2012    upper arm arm  . Forearm fracture surgery Bilateral     "fell in the basement"  . Fracture surgery    . Pseudoaneurysm repair Right 05/2013    leg  . Cataract extraction w/phaco Right 10/19/2013    Procedure: CATARACT EXTRACTION PHACO AND INTRAOCULAR LENS PLACEMENT (IOC);  Surgeon: Loraine Leriche T. Nile Riggs, MD;  Location: [facility];  Service: Ophthalmology;  Laterality: Right;  CDE:15.66  . Cataract extraction w/phaco Left 11/02/2013    Procedure: CATARACT EXTRACTION PHACO AND INTRAOCULAR  LENS PLACEMENT (IOC);  Surgeon: Loraine Leriche T. Nile Riggs, MD;  Location: [facility];  Service: Ophthalmology;  Laterality: Left;  CDE:8.78  . Left and right heart catheterization with coronary angiogram N/A 05/11/2013    Procedure: LEFT AND RIGHT HEART CATHETERIZATION WITH CORONARY ANGIOGRAM;  Surgeon: Runell Gess, MD;  Location: [facility] CATH LAB;  Service: Cardiovascular;  Laterality: N/A;  . Percutaneous coronary rotoblator intervention (pci-r) N/A 05/14/2013    Procedure: PERCUTANEOUS CORONARY ROTOBLATOR INTERVENTION (PCI-R);  Surgeon: Lennette Bihari, MD;  Location: [facility] CATH LAB;  Service: Cardiovascular;  Laterality: N/A;  . Percutaneous coronary rotoblator intervention (pci-r) N/A 05/19/2013      Procedure: PERCUTANEOUS CORONARY ROTOBLATOR INTERVENTION (PCI-R);  Surgeon: Lennette Bihari, MD;  Location: [facility] CATH LAB;  Service: Cardiovascular;  Laterality: N/A;  . Cardiac catheterization  ; 11/03/2014  . Coronary angioplasty with stent placement    . Cataract extraction w/ intraocular lens  implant, bilateral Bilateral 2015  . Left and right heart catheterization with coronary angiogram N/A 11/03/2014    Procedure: LEFT AND RIGHT HEART CATHETERIZATION WITH CORONARY ANGIOGRAM;  Surgeon: Kathleene Hazel, MD;  Location: [facility] CATH LAB;  Service: Cardiovascular;  Laterality: N/A;    Family History  Problem Relation Age of Onset  . Heart attack Sister     History   Social History  . Marital Status: Married    Spouse Name: N/A  . Number of Children: N/A  . Years of Education: N/A   Occupational History  . Not on file.   Social History Main Topics  . Smoking status: Never Smoker   . Smokeless tobacco: Never Used  . Alcohol Use: No  . Drug Use: No  . Sexual Activity: No   Other Topics Concern  . Not on file   Social History Narrative    Current Outpatient Prescriptions  Medication Sig Dispense Refill  . acetaminophen (TYLENOL) 325 MG tablet Take 2 tablets (650 mg total) by mouth every 4 (four) hours as needed for headache or mild pain.    Marland Kitchen ALPRAZolam (XANAX) 0.25 MG tablet Take 1 tablet (0.25 mg total) by mouth 2 (two) times daily as needed for anxiety. 10 tablet 0  . amiodarone (PACERONE) 200 MG tablet Take 1 tablet (200 mg total) by mouth daily. 30 tablet 5  . atorvastatin (LIPITOR) 40 MG tablet Take 40 mg by mouth daily.      . clopidogrel (PLAVIX) 75 MG tablet TAKE ONE (1) TABLET EACH DAY 30 tablet 11  . darbepoetin (ARANESP) 100 MCG/0.5ML SOLN injection Inject 100 mcg into the skin See admin instructions. Takes every Monday, Wednesday and Friday with dialysis    . doxercalciferol (HECTOROL) 4 MCG/2ML injection Inject 3.5 mcg into the vein every Monday, Wednesday,  and Friday with hemodialysis.    . folic acid (FOLVITE) 1 MG tablet Take 1 mg by mouth daily.    . metoprolol tartrate (LOPRESSOR) 25 MG tablet TAKE ONE TABLET BY MOUTH TWICE A DAY 60 tablet 5  . multivitamin (RENA-VIT) TABS tablet Take 1 tablet by mouth daily.    . nitroGLYCERIN (NITROSTAT) 0.4 MG SL tablet Place 1 tablet (0.4 mg total) under the tongue every 5 (five) minutes as needed for chest pain. 25 tablet 2  . sevelamer carbonate (RENVELA) 800 MG tablet Take 1,600 mg by mouth 2 (two) times daily with a meal.     . sodium bicarbonate 325 MG tablet Take 325 mg by mouth 2 (two) times daily.    . traMADol (ULTRAM) 50  MG tablet Take 50 mg by mouth every 6 (six) hours as needed.    . warfarin (COUMADIN) 3 MG tablet Take 0.5-1 tablets (1.5-3 mg total) by mouth daily. Takes 1.5 mg on Sun, Tue, Wed, Thu, and Sat. Takes 3 mg on Mon & Fri.-     No current facility-administered medications for this visit.    No Known Allergies    Review of Systems:   General:  poor appetite, poor energy, no weight gain, has  weight loss, no fever  Cardiac:  no chest pain with exertion, no chest pain at rest, has SOB with mild exertion, no resting SOB, no PND, no orthopnea, no palpitations, has arrhythmia, recent atrial fibrillation, has LE edema, has dizzy spells, no syncope  Respiratory:  has shortness of breath, no home oxygen, no productive cough, no dry cough, no bronchitis, no wheezing, no hemoptysis, no asthma, no pain with inspiration or cough, no sleep apnea, no CPAP at night  GI:   no difficulty swallowing, no reflux, no frequent heartburn, no hiatal hernia, no abdominal pain, has chronic constipation, no diarrhea, no hematochezia, no hematemesis, no melena  GU:   no dysuria,  no frequency, no urinary tract infection, no hematuria,  no kidney stones, end stage kidney disease  Vascular:  no pain suggestive of claudication, no pain in feet, no leg cramps, no varicose veins, no DVT, no non-healing foot  ulcer  Neuro:   no stroke, no TIA's, no seizures, no headaches, notemporary blindness one eye,  no slurred speech, no peripheral neuropathy, no chronic pain, has instability of gait, no memory/cognitive dysfunction  Musculoskeletal: no arthritis, no joint swelling, no myalgias, has difficulty walking, poor mobility   Skin:   no rash, no itching, no skin infections, no pressure sores or ulcerations  Psych:   no anxiety, no depression, no nervousness, no unusual recent stress  Eyes:   no blurry vision, no floaters, no recent vision changes, no wears glasses or contacts  ENT:   no hearing loss, no loose or painful teeth, wears dentures  Hematologic:  no easy bruising, no abnormal bleeding, no clotting disorder, no frequent epistaxis  Endocrine:  no diabetes       Physical Exam:   BP 108/52 mmHg  Pulse 56  Resp 20  Ht 5\' 4"  (1.626 m)  Wt 117 lb (53.071 kg)  BMI 20.07 kg/m2  SpO2 96%  General:             Elderly, frail woman in no distress  HEENT:  Unremarkable , NCAT, PERLA, EOMI  Neck:   no JVD, no bruits, no adenopathy or thyromegaly. Normal carotid pulses with bilateral bruits.  Chest:   clear to auscultation, symmetrical breath sounds, no wheezes, no rhonchi   CV:   RRR, grade III/VI crescendo/decrescendo murmur heard best at RSB,  no diastolic murmur  Abdomen:  soft, non-tender, no masses or organomegaly  Extremities:  warm, well-perfused, pulses palpable in feet, no LE edema  Rectal/GU  Deferred  Neuro:   Grossly non-focal and symmetrical throughout  Skin:   Clean and dry, no rashes, no breakdown   Diagnostic Tests:    *Cardiovascular Imaging at [facility]         [address], [address]            [place], [place] [address]              [phone]  ------------------------------------------------------------------- Transthoracic Echocardiography  Patient:  Arlene, Brickel MR #:  [id number] Study Date: 08/18/2014 Gender:    F Age:    [age] Height:   162.6 cm Weight:   55.3 kg BSA:    1.58 m^2 Pt. Status: Room:  ORDERING   Nanetta BattyJonathan Berry, MD REFERRING  Nanetta BattyJonathan Berry, MD REFERRING  Mirna MiresHill, Gerald [phone] ATTENDING  Zoila ShutterKenneth Hilty MD PERFORMING  [facility], Outpatient SONOGRAPHER [facility] PalmBethany McMahill, RDCS  cc:  ------------------------------------------------------------------- LV EF: 60% -  65%  ------------------------------------------------------------------- Indications:   Aortic Stenosis (I35.0).  ------------------------------------------------------------------- History:  PMH: NSTEMI, Chronic Kidney Disease with Hemodialysis. Atrial fibrillation. Coronary artery disease. Aortic valve disease. Risk factors: Hypertension. Diabetes mellitus. Dyslipidemia.  ------------------------------------------------------------------- Study Conclusions  - Left ventricle: The cavity size was normal. There was mild focal basal hypertrophy of the septum. Systolic function was normal. The estimated ejection fraction was in the range of 60% to 65%. Wall motion was normal; there were no regional wall motion abnormalities. Doppler parameters are consistent with a reversible restrictive pattern, indicative of decreased left ventricular diastolic compliance and/or increased left atrial pressure (grade 3 diastolic dysfunction). Doppler parameters are consistent with both elevated ventricular end-diastolic filling pressure and elevated left atrial filling pressure. - Aortic valve: Trileaflet; severely thickened, severely calcified leaflets. Valve mobility was restricted. There was moderate to severe stenosis. Mean gradient (S): 33 mm Hg. Peak gradient (S): 55 mm Hg. Valve area (VTI): 0.7 cm^2. Valve area (Vmax): 0.73 cm^2. Valve area (Vmean): 0.65 cm^2. - Aortic root: The aortic root was normal in size. - Mitral valve: Calcified annulus. Severely thickened,  severely calcified leaflets . Mobility was restricted. The findings are consistent with mild to moderate stenosis. There was moderate regurgitation. Mean gradient (D): 5 mm Hg. Peak gradient (D): 14 mm Hg. Valve area by continuity equation (using LVOT flow): 1.3 cm^2. - Left atrium: The atrium was moderately to severely dilated. - Right ventricle: The cavity size was mildly dilated. Wall thickness was normal. Systolic function was mildly reduced. - Right atrium: The atrium was moderately dilated. - Tricuspid valve: There was mild regurgitation. - Pulmonary arteries: Systolic pressure was moderately increased. PA peak pressure: 51 mm Hg (S).  Transthoracic echocardiography. M-mode, complete 2D, spectral Doppler, and color Doppler. Birthdate: Patient birthdate: [birth year]. Age: Patient is 81 yr old. Sex: Gender: female. BMI: 20.9 kg/m^2. Blood pressure:   140/62 Patient status: Outpatient. Study date: Study date: 08/18/2014. Study time: 09:37 AM. Location: Echo laboratory.  -------------------------------------------------------------------  ------------------------------------------------------------------- Left ventricle: The cavity size was normal. There was mild focal basal hypertrophy of the septum. Systolic function was normal. The estimated ejection fraction was in the range of 60% to 65%. Wall motion was normal; there were no regional wall motion abnormalities. Doppler parameters are consistent with a reversible restrictive pattern, indicative of decreased left ventricular diastolic compliance and/or increased left atrial pressure (grade 3 diastolic dysfunction). Doppler parameters are consistent with both elevated ventricular end-diastolic filling pressure and elevated left atrial filling pressure.  ------------------------------------------------------------------- Aortic valve:  Trileaflet; severely thickened, severely calcified leaflets.  Valve mobility was restricted. Doppler:  There was moderate to severe stenosis.  There was no significant regurgitation.  VTI ratio of LVOT to aortic valve: 0.2. Valve area (VTI): 0.7 cm^2. Indexed valve area (VTI): 0.44 cm^2/m^2. Valve area (Vmax): 0.73 cm^2. Indexed valve area (Vmax): 0.46 cm^2/m^2. Mean velocity ratio of LVOT to aortic valve: 0.19. Valve area (Vmean): 0.65 cm^2. Indexed valve area (Vmean): 0.41 cm^2/m^2.  Mean gradient (S): 33 mm Hg. Peak gradient (S): 55 mm Hg.  ------------------------------------------------------------------- Aorta: Aortic root: The aortic root was  normal in size.  ------------------------------------------------------------------- Mitral valve:  Calcified annulus. Severely thickened, severely calcified leaflets . Mobility was restricted. Doppler:  The findings are consistent with mild to moderate stenosis.  There was moderate regurgitation.  Valve area by pressure half-time: 2.72 cm^2. Indexed valve area by pressure half-time: 1.72 cm^2/m^2. Valve area by continuity equation (using LVOT flow): 1.3 cm^2. Indexed valve area by continuity equation (using LVOT flow): 0.82 cm^2/m^2.  Mean gradient (D): 5 mm Hg. Peak gradient (D): 14 mm Hg.  ------------------------------------------------------------------- Left atrium: LA Volume/BSA= 101.9 ml/m2. The atrium was moderately to severely dilated.  ------------------------------------------------------------------- Right ventricle: The cavity size was mildly dilated. Wall thickness was normal. Systolic function was mildly reduced.  ------------------------------------------------------------------- Pulmonic valve:  Doppler: Transvalvular velocity was within the normal range. There was no evidence for stenosis.  ------------------------------------------------------------------- Tricuspid valve:  Structurally normal valve.  Doppler: Transvalvular velocity was within the  normal range. There was mild regurgitation.  ------------------------------------------------------------------- Pulmonary artery:  The main pulmonary artery was normal-sized. Systolic pressure was moderately increased.  ------------------------------------------------------------------- Right atrium: The atrium was moderately dilated.  ------------------------------------------------------------------- Pericardium: There was no pericardial effusion.  ------------------------------------------------------------------- Systemic veins: Inferior vena cava: The vessel was normal in size. The respirophasic diameter changes were in the normal range (= 50%), consistent with normal central venous pressure. Diameter: 13.3 mm.  ------------------------------------------------------------------- Measurements  IVC                    Value     Reference ID                    13.3 mm    ---------  Left ventricle              Value     Reference LV ID, ED, PLAX chordal          44.2 mm    43 - 52 LV ID, ES, PLAX chordal          35.7 mm    23 - 38 LV fx shortening, PLAX chordal  (L)   19  %    >=29 LV PW thickness, ED            9.81 mm    --------- IVS/LV PW ratio, ED        (H)   1.44      <=1.3 Stroke volume, 2D             70  ml    --------- Stroke volume/bsa, 2D           44  ml/m^2  --------- LV e&', lateral              6.14 cm/s   --------- LV E/e&', lateral             30.46     --------- LV e&', medial               5.59 cm/s   --------- LV E/e&', medial              33.45     --------- LV e&', average              5.87 cm/s   --------- LV E/e&', average             31.88      ---------  Ventricular septum            Value     Reference IVS thickness, ED  14.1 mm    ---------  LVOT                   Value     Reference LVOT ID, S                21  mm    --------- LVOT area                 3.46 cm^2   --------- LVOT peak velocity, S           77.7 cm/s   --------- LVOT mean velocity, S           51.2 cm/s   --------- LVOT VTI, S                20.2 cm    ---------  Aortic valve               Value     Reference Aortic valve mean velocity, S       272  cm/s   --------- Aortic valve VTI, S            99.6 cm    --------- Aortic mean gradient, S          33  mm Hg  --------- Aortic peak gradient, S          55  mm Hg  --------- VTI ratio, LVOT/AV            0.2      --------- Aortic valve area, VTI          0.7  cm^2   --------- Aortic valve area/bsa, VTI        0.44 cm^2/m^2 --------- Aortic valve area, peak velocity     0.73 cm^2   --------- Aortic valve area/bsa, peak        0.46 cm^2/m^2 --------- velocity Velocity ratio, mean, LVOT/AV       0.19      --------- Aortic valve area, mean velocity     0.65 cm^2   --------- Aortic valve area/bsa, mean        0.41 cm^2/m^2 --------- velocity  Aorta                   Value     Reference Aortic root ID, ED            30  mm    ---------  Left atrium                Value     Reference LA ID, A-P, ES              48  mm    --------- LA ID/bsa, A-P          (H)   3.04 cm/m^2  <=2.2 LA volume, S               162  ml    --------- LA volume/bsa, S              102.5 ml/m^2  --------- LA volume, ES, 1-p A4C          113  ml    --------- LA volume/bsa, ES, 1-p A4C        71.5 ml/m^2  --------- LA volume, ES, 1-p A2C          210  ml    --------- LA volume/bsa, ES, 1-p A2C        132.9  ml/m^2  ---------  Mitral valve               Value     Reference Mitral E-wave peak velocity        187  cm/s   --------- Mitral A-wave peak velocity        84.8 cm/s   --------- Mitral mean velocity, D          96.3 cm/s   --------- Mitral deceleration time     (H)   264  ms    150 - 230 Mitral pressure half-time         81  ms    --------- Mitral mean gradient, D          5   mm Hg  --------- Mitral peak gradient, D          14  mm Hg  --------- Mitral E/A ratio, peak          2.2      --------- Mitral valve area, PHT, DP        2.72 cm^2   --------- Mitral valve area/bsa, PHT, DP      1.72 cm^2/m^2 --------- Mitral valve area, LVOT          1.3  cm^2   --------- continuity Mitral valve area/bsa, LVOT        0.82 cm^2/m^2 --------- continuity Mitral annulus VTI, D           53.7 cm    --------- Mitral regurg VTI, PISA          216  cm    ---------  Pulmonary arteries            Value     Reference PA pressure, S, DP        (H)   51  mm Hg  <=30  Tricuspid valve              Value     Reference Tricuspid regurg peak velocity      345  cm/s   --------- Tricuspid peak RV-RA gradient       48  mm Hg  ---------  Legend: (L) and (H) mark values outside specified reference range.  ------------------------------------------------------------------- Prepared and Electronically Authenticated by  Tobias Alexander,  [name] 2016-02-11T16:08:18    Cardiac Catheterization Operative Report  [name] [id number] 4/28/201611:57 AM [name], MD  Procedure Performed:  1. Left Heart Catheterization 2. Selective Coronary Angiography 3. Right Heart Catheterization 4. Aortic root angiogram 5. PTCA with balloon angioplasty, cutting balloon angioplasty ostial RCA restenosis within old stent  Operator: Verne Carrow, MD  Indication: 81 yo female with history of aortic valve stenosis, end stage renal disease on hemodialysis for 4 years, atrial fibrillation on coumadin, CAD s/p prior PCI and stenting of the RCA and Circumflex, HTN, hyperlipidemia and chronic diastolic CHF who I recently saw for evaluation for evaluation for TAVR. Her AS has progressed to a severe range. She is now having dyspnea with minimal exertion. Right and left heart cath today to assess coronaries, valve gradient and pressures.  Procedure Details: The risks, benefits, complications, treatment options, and expected outcomes were discussed with the patient. The patient and/or family concurred with the proposed plan, giving informed consent. The patient was brought to the cath lab after IV hydration was begun and oral premedication was given. The patient was further sedated with Versed. The right groin was prepped and draped in the usual manner. Using the modified Seldinger  access technique, a 5 French sheath was placed in the right femoral artery. A 7 French sheath was inserted into the right femoral vein. A balloon tipped catheter was used to perform a right heart catheterization. Standard diagnostic catheters were used to perform selective coronary angiography. I crossed the aortic valve using an AL-1 catheter and long straight wire. A pigtail catheter was advanced over the wire and into the LV. No LV gram. Pressures measured. Aortic root angiogram was performed. She was found to have severe restenosis in  the stented segment of the ostium and proximal portion of the RCA. I elected to proceed to PCI of the this segment given the complexity of arranging her cardiac cath with dialysis and long term coumadin therapy.   PCI Note: The sheath was upsized to a 6 [place] system. She was given a weight based bolus of Angiomax and a drip was started. She was given Plavix 600 mg po x 1. When the ACT was over 200, I engaged the RCA with a 6 [place] JR4 guiding catheter with sideholes. I then advanced a Cougar IC wire down the RCA into the distal vessel. A 3.0 x 12 mm [place] balloon was used to dilate the restenotic ostial and proximal stented segment. I then used a 3.0 x 10 mm Angiosculpt cutting balloon to dilate this segment. A 3.5 x 12 mm [place] balloon was then used for the final dilatation. The stenosis was taken from 95% down to 40%. There was excellent flow down the vessel.   There were no immediate complications. The patient was taken to the recovery area in stable condition.   Hemodynamic Findings: Ao: 152/46  LV: 169/14/26 RA: 8  RV: 75/3/10 PA: 72/19 (mean 39)  PCWP: 19 Fick Cardiac Output: 4.9 L/min Fick Cardiac Index: 3.1 L/min/m2 Central Aortic Saturation: 98% Pulmonary Artery Saturation: 69%  Aortic valve data:  Peak to peak gradient: 17 mmHg Mean gradient 17 mmHg AVA 1.33 cm2  Angiographic Findings:  Left main: Ostial 20% stenosis.   Left Anterior Descending Artery: Large caliber vessel that courses to the apex. The entire vessel is heavily calcified. There is a 40% ostial stenosis. The proximal vessel has diffuse 30% stenosis. The mid vessel has diffuse 40% stenosis with no focally obstructive lesions. The distal vessel has diffuse plaque. There are several small caliber diagonal branches.   Circumflex Artery: Large caliber vessel with patent mid stented segment without restenosis. The first obtuse marginal branch is patent with diffuse calcification and 50%  stenosis in the superior sub-branch beyond the bifurcation. The AV groove Circumflex is occluded, chronic.   Right Coronary Artery: Large dominant vessel with heavy calcification noted in the entire vessel. The ostial/proximal stented segment has 95% restenosis. The mid stented segment is patent with diffuse 20% restenosis. The PDA and PLA are small in caliber with 60-70% stenosis noted in the ostium of both vessels.   Left Ventricular Angiogram: Deferred.   Aortic root angiogram: Heavily calcified. No aneurysmal dilatation.   Impression: 1. Severe double vessel CAD 2. Patent stents mid Circumflex 3. Severe restenosis proximal RCA stent, now s/p successful PTCA/cutting balloon angioplasty RCA stented segment ostium/proximal vessel 4. Severe aortic valve stenosis  Recommendations: Will continue Plavix and restart coumadin. Monitor overnight. HD here in am. She will then f/u with Dr. Cornelius Moras and Dr. Laneta Simmers.    Complications: None; patient tolerated the procedure well.      Impression:  This 81 year old frail woman on hemodialysis has moderate to severe AS by  echo. Her valve is heavily calcified with restricted leaflet motion but her mean gradient by echo is only 33 mm Hg and 17 mm Hg at cath. She also has a very calcified mitral valve with moderate mitral stenosis and mitral regurgitation. She has stents in her RCA and LCX with recent restenosis in the RCA stent requiring PCI. It is difficult to tell how symptomatic she is because her daughter says that she does not do anything but sleep and go to dialysis. She does not eat and is severely malnourished with an albumin of 2.3. Her decline has been going on for a couple years and I don't think AVR is going to change that. She would be very high risk even for TAVR and would likely not make a functional recovery. In addition I think her mitral valve disease is almost as significant as her aortic valve disease. Her prognosis for  survival over the next few years is poor and I would recommend palliative medical therapy. I reviewed all of the studies with her and her daughter and discussed my recommendation for palliative medical care. They seem to understand. All of their questions have been answered.   Plan:  She will continue to follow up with her PCP, nephrologist and Dr. Allyson Sabal.    Alleen Borne, MD

## 2014-11-11 DIAGNOSIS — Z992 Dependence on renal dialysis: Secondary | ICD-10-CM | POA: Diagnosis not present

## 2014-11-11 DIAGNOSIS — D631 Anemia in chronic kidney disease: Secondary | ICD-10-CM | POA: Diagnosis not present

## 2014-11-11 DIAGNOSIS — N2581 Secondary hyperparathyroidism of renal origin: Secondary | ICD-10-CM | POA: Diagnosis not present

## 2014-11-11 DIAGNOSIS — N186 End stage renal disease: Secondary | ICD-10-CM | POA: Diagnosis not present

## 2014-11-14 ENCOUNTER — Encounter (HOSPITAL_COMMUNITY): Payer: Self-pay | Admitting: *Deleted

## 2014-11-14 ENCOUNTER — Emergency Department (HOSPITAL_COMMUNITY)
Admission: EM | Admit: 2014-11-14 | Discharge: 2014-11-15 | Disposition: A | Discharge status: Home / Self Care | Payer: Medicare Other | Attending: Emergency Medicine | Admitting: Emergency Medicine

## 2014-11-14 ENCOUNTER — Emergency Department (HOSPITAL_COMMUNITY): Payer: Medicare Other

## 2014-11-14 DIAGNOSIS — Z9861 Coronary angioplasty status: Secondary | ICD-10-CM | POA: Diagnosis not present

## 2014-11-14 DIAGNOSIS — F419 Anxiety disorder, unspecified: Secondary | ICD-10-CM | POA: Insufficient documentation

## 2014-11-14 DIAGNOSIS — N2581 Secondary hyperparathyroidism of renal origin: Secondary | ICD-10-CM | POA: Diagnosis not present

## 2014-11-14 DIAGNOSIS — Z8701 Personal history of pneumonia (recurrent): Secondary | ICD-10-CM | POA: Diagnosis not present

## 2014-11-14 DIAGNOSIS — I509 Heart failure, unspecified: Secondary | ICD-10-CM | POA: Diagnosis not present

## 2014-11-14 DIAGNOSIS — I252 Old myocardial infarction: Secondary | ICD-10-CM | POA: Diagnosis not present

## 2014-11-14 DIAGNOSIS — N390 Urinary tract infection, site not specified: Secondary | ICD-10-CM | POA: Insufficient documentation

## 2014-11-14 DIAGNOSIS — Z992 Dependence on renal dialysis: Secondary | ICD-10-CM | POA: Diagnosis not present

## 2014-11-14 DIAGNOSIS — Z9889 Other specified postprocedural states: Secondary | ICD-10-CM | POA: Insufficient documentation

## 2014-11-14 DIAGNOSIS — I120 Hypertensive chronic kidney disease with stage 5 chronic kidney disease or end stage renal disease: Secondary | ICD-10-CM | POA: Insufficient documentation

## 2014-11-14 DIAGNOSIS — N186 End stage renal disease: Secondary | ICD-10-CM | POA: Diagnosis not present

## 2014-11-14 DIAGNOSIS — R05 Cough: Secondary | ICD-10-CM | POA: Insufficient documentation

## 2014-11-14 DIAGNOSIS — Z7901 Long term (current) use of anticoagulants: Secondary | ICD-10-CM | POA: Insufficient documentation

## 2014-11-14 DIAGNOSIS — E785 Hyperlipidemia, unspecified: Secondary | ICD-10-CM | POA: Insufficient documentation

## 2014-11-14 DIAGNOSIS — Z79899 Other long term (current) drug therapy: Secondary | ICD-10-CM | POA: Diagnosis not present

## 2014-11-14 DIAGNOSIS — I2510 Atherosclerotic heart disease of native coronary artery without angina pectoris: Secondary | ICD-10-CM | POA: Diagnosis not present

## 2014-11-14 DIAGNOSIS — R059 Cough, unspecified: Secondary | ICD-10-CM

## 2014-11-14 DIAGNOSIS — R531 Weakness: Secondary | ICD-10-CM | POA: Insufficient documentation

## 2014-11-14 DIAGNOSIS — R011 Cardiac murmur, unspecified: Secondary | ICD-10-CM | POA: Insufficient documentation

## 2014-11-14 DIAGNOSIS — D649 Anemia, unspecified: Secondary | ICD-10-CM | POA: Diagnosis not present

## 2014-11-14 DIAGNOSIS — D631 Anemia in chronic kidney disease: Secondary | ICD-10-CM | POA: Diagnosis not present

## 2014-11-14 LAB — CBC WITH DIFFERENTIAL/PLATELET
BASOS PCT: 1 % (ref 0–1)
Basophils Absolute: 0.1 10*3/uL (ref 0.0–0.1)
Eosinophils Absolute: 2.4 10*3/uL — ABNORMAL HIGH (ref 0.0–0.7)
Eosinophils Relative: 23 % — ABNORMAL HIGH (ref 0–5)
HEMATOCRIT: 34.2 % — AB (ref 36.0–46.0)
Hemoglobin: 10.4 g/dL — ABNORMAL LOW (ref 12.0–15.0)
LYMPHS PCT: 22 % (ref 12–46)
Lymphs Abs: 2.3 10*3/uL (ref 0.7–4.0)
MCH: 27.9 pg (ref 26.0–34.0)
MCHC: 30.4 g/dL (ref 30.0–36.0)
MCV: 91.7 fL (ref 78.0–100.0)
MONOS PCT: 8 % (ref 3–12)
Monocytes Absolute: 0.8 10*3/uL (ref 0.1–1.0)
NEUTROS PCT: 46 % (ref 43–77)
Neutro Abs: 4.9 10*3/uL (ref 1.7–7.7)
Platelets: 249 10*3/uL (ref 150–400)
RBC: 3.73 MIL/uL — ABNORMAL LOW (ref 3.87–5.11)
RDW: 22 % — ABNORMAL HIGH (ref 11.5–15.5)
WBC: 10.6 10*3/uL — ABNORMAL HIGH (ref 4.0–10.5)

## 2014-11-14 LAB — BASIC METABOLIC PANEL
ANION GAP: 11 (ref 5–15)
BUN: 15 mg/dL (ref 6–20)
CHLORIDE: 96 mmol/L — AB (ref 101–111)
CO2: 31 mmol/L (ref 22–32)
CREATININE: 3.67 mg/dL — AB (ref 0.44–1.00)
Calcium: 9.5 mg/dL (ref 8.9–10.3)
GFR calc Af Amer: 12 mL/min — ABNORMAL LOW (ref >60)
GFR calc non Af Amer: 10 mL/min — ABNORMAL LOW (ref >60)
Glucose, Bld: 88 mg/dL (ref 70–99)
Potassium: 3.5 mmol/L (ref 3.5–5.1)
Sodium: 138 mmol/L (ref 135–145)

## 2014-11-14 NOTE — ED Provider Notes (Signed)
CSN: [id number]     Arrival date & time 11/14/14  1955 History  This chart was scribed for Devoria Albe, MD by Buckner Malta, ED Scribe. This patient was seen in room APA08/APA08 and the patient's care was started at 11:22 PM.    Chief Complaint  Patient presents with  . Weakness   The history is provided by the patient and a relative. No language interpreter was used.    HPI Comments: [name] is a 81 y.o. female dialysis patient who presents to the Emergency Department complaining of constant, moderate generalized weakness that started a few months ago, but became worse this morning. The patient reports increased fatigue, decreased appetite, nausea, intermittent vomiting that started a 6 hours ago and constipation as associated symptoms. Her last BM was 2 days ago and hard. She states she had to take it out of her rectum. Pt's daughter reports pt had has increased difficulty with ambulation in the last 3 days. Pt was recently hospitalized for a cardiac catheterization to evaluate for aortic stenosis and discharged on 4/28. Pt denies smoking. She also denies fever and abdominal pain as associated symptoms. She reports a cough that started today. Patient reports she has chronic constipation. She has had loss of appetite "for a while". Family reports she mainly sleeps all the time although today she's been awake most of the day.  PCP Dr Loleta Chance Cardiology Dr Allyson Sabal Nephrology Dr Kristian Covey  Past Medical History  Diagnosis Date  . Anemia 03/06/2011  . Hypertension   . Coronary artery disease     PCI 2014, 2016  . Aortic stenosis, mild     severe aortic stenosis  . A-fib   . Acute CHF   . Hyperlipidemia   . Myocardial infarction 2014  . Heart murmur   . Pneumonia 10/2014  . Anxiety   . ESRD (end stage renal disease) on dialysis     "MWF; [facility]; [place]" (11/03/2014)   Past Surgical History  Procedure Laterality Date  . Av fistula placement Left ~ 2012    upper arm arm  . Forearm  fracture surgery Bilateral     "fell in the basement"  . Fracture surgery    . Pseudoaneurysm repair Right 05/2013    leg  . Cataract extraction w/phaco Right 10/19/2013    Procedure: CATARACT EXTRACTION PHACO AND INTRAOCULAR LENS PLACEMENT (IOC);  Surgeon: Loraine Leriche T. Nile Riggs, MD;  Location: [facility];  Service: Ophthalmology;  Laterality: Right;  CDE:15.66  . Cataract extraction w/phaco Left 11/02/2013    Procedure: CATARACT EXTRACTION PHACO AND INTRAOCULAR LENS PLACEMENT (IOC);  Surgeon: Loraine Leriche T. Nile Riggs, MD;  Location: [facility];  Service: Ophthalmology;  Laterality: Left;  CDE:8.78  . Left and right heart catheterization with coronary angiogram N/A 05/11/2013    Procedure: LEFT AND RIGHT HEART CATHETERIZATION WITH CORONARY ANGIOGRAM;  Surgeon: Runell Gess, MD;  Location: [facility] CATH LAB;  Service: Cardiovascular;  Laterality: N/A;  . Percutaneous coronary rotoblator intervention (pci-r) N/A 05/14/2013    Procedure: PERCUTANEOUS CORONARY ROTOBLATOR INTERVENTION (PCI-R);  Surgeon: Lennette Bihari, MD;  Location: [facility] CATH LAB;  Service: Cardiovascular;  Laterality: N/A;  . Percutaneous coronary rotoblator intervention (pci-r) N/A 05/19/2013    Procedure: PERCUTANEOUS CORONARY ROTOBLATOR INTERVENTION (PCI-R);  Surgeon: Lennette Bihari, MD;  Location: [facility] CATH LAB;  Service: Cardiovascular;  Laterality: N/A;  . Cardiac catheterization  ; 11/03/2014  . Coronary angioplasty with stent placement    . Cataract extraction w/ intraocular lens  implant, bilateral Bilateral 2015  .  Left and right heart catheterization with coronary angiogram N/A 11/03/2014    Procedure: LEFT AND RIGHT HEART CATHETERIZATION WITH CORONARY ANGIOGRAM;  Surgeon: Kathleene Hazelhristopher D McAlhany, MD;  Location[facility] CATH LAB;  Service: Cardiovascular;  Laterality: N/A;   Family History  Problem Relation Age of Onset  . Heart attack Sister    History  Substance Use Topics  . Smoking status: Never Smoker   . Smokeless tobacco: Never Used  . Alcohol  Use: No   lives of her husband and son Uses a walker  OB History    No data available     Review of Systems  Constitutional: Positive for appetite change and fatigue. Negative for fever.  Gastrointestinal: Positive for nausea, vomiting and constipation. Negative for abdominal pain.          Neurological: Positive for weakness.  All other systems reviewed and are negative.   Allergies  Review of patient's allergies indicates no known allergies.  Home Medications   Prior to Admission medications   Medication Sig Start Date End Date Taking? Authorizing Provider  acetaminophen (TYLENOL) 325 MG tablet Take 2 tablets (650 mg total) by mouth every 4 (four) hours as needed for headache or mild pain. 11/04/14   Abelino DerrickLuke K Kilroy, PA-C  ALPRAZolam (XANAX) 0.25 MG tablet Take 1 tablet (0.25 mg total) by mouth 2 (two) times daily as needed for anxiety. 10/20/14   Kathleene Hazelhristopher D McAlhany, MD  amiodarone (PACERONE) 200 MG tablet Take 1 tablet (200 mg total) by mouth daily. 10/18/14   Kathleene Hazelhristopher D McAlhany, MD  atorvastatin (LIPITOR) 40 MG tablet Take 40 mg by mouth daily.      Historical Provider, MD  cephALEXin (KEFLEX) 500 MG capsule Take 1 capsule (500 mg total) by mouth 2 (two) times daily. 11/15/14   Devoria AlbeIva Buell Parcel, MD  clopidogrel (PLAVIX) 75 MG tablet TAKE ONE (1) TABLET EACH DAY 09/01/14   Runell GessJonathan J Berry, MD  darbepoetin (ARANESP) 100 MCG/0.5ML SOLN injection Inject 100 mcg into the skin See admin instructions. Takes every Monday, Wednesday and Friday with dialysis    Historical Provider, MD  doxercalciferol (HECTOROL) 4 MCG/2ML injection Inject 3.5 mcg into the vein every Monday, Wednesday, and Friday with hemodialysis.    Historical Provider, MD  folic acid (FOLVITE) 1 MG tablet Take 1 mg by mouth daily.    Historical Provider, MD  metoprolol tartrate (LOPRESSOR) 25 MG tablet TAKE ONE TABLET BY MOUTH TWICE A DAY 03/11/14   Chrystie NoseKenneth C Hilty, MD  multivitamin (RENA-VIT) TABS tablet Take 1 tablet by  mouth daily.    Historical Provider, MD  nitroGLYCERIN (NITROSTAT) 0.4 MG SL tablet Place 1 tablet (0.4 mg total) under the tongue every 5 (five) minutes as needed for chest pain. 11/04/14   Abelino DerrickLuke K Kilroy, PA-C  sevelamer carbonate (RENVELA) 800 MG tablet Take 1,600 mg by mouth 2 (two) times daily with a meal.     Historical Provider, MD  sodium bicarbonate 325 MG tablet Take 325 mg by mouth 2 (two) times daily.    Historical Provider, MD  traMADol (ULTRAM) 50 MG tablet Take 50 mg by mouth every 6 (six) hours as needed.    Historical Provider, MD  warfarin (COUMADIN) 3 MG tablet Take 0.5-1 tablets (1.5-3 mg total) by mouth daily. Takes 1.5 mg on Sun, Tue, Wed, Thu, and Sat. Takes 3 mg on Mon & Fri.- 11/05/14   Abelino DerrickLuke K Kilroy, PA-C   BP 138/37 mmHg  Pulse 51  Temp(Src) 97.8 F (36.6 C) (Oral)  Resp 13  Ht  (1.626 m)  Wt 117 lb (53.071 kg)  BMI 20.07 kg/m2  SpO2 98%  Vital signs normal except for bradycardia  Physical Exam  Constitutional: She is oriented to person, place, and time. She appears well-developed and well-nourished.  Non-toxic appearance. She does not appear ill. No distress.  HENT:  Head: Normocephalic and atraumatic.  Right Ear: External ear normal.  Left Ear: External ear normal.  Nose: Nose normal. No mucosal edema or rhinorrhea.  Mouth/Throat: Oropharynx is clear and moist and mucous membranes are normal. No dental abscesses or uvula swelling.  Eyes: Conjunctivae and EOM are normal. Pupils are equal, round, and reactive to light.  Neck: Normal range of motion and full passive range of motion without pain. Neck supple.  Cardiovascular: Normal rate, regular rhythm and normal heart sounds.  Exam reveals no gallop and no friction rub.   No murmur heard. Loud systolic murmur followed by a loud click  Pulmonary/Chest: Effort normal and breath sounds normal. No respiratory distress. She has no wheezes. She has no rhonchi. She has no rales. She exhibits no tenderness and no  crepitus.  Abdominal: Soft. Normal appearance and bowel sounds are normal. She exhibits no distension. There is no tenderness. There is no rebound and no guarding.  Musculoskeletal: Normal range of motion. She exhibits no edema or tenderness.  Moves all extremities well.   Neurological: She is alert and oriented to person, place, and time. She has normal strength. No cranial nerve deficit.  Skin: Skin is warm, dry and intact. No rash noted. No erythema. No pallor.  Psychiatric: She has a normal mood and affect. Her speech is normal and behavior is normal. Her mood appears not anxious.  Nursing note and vitals reviewed.   ED Course  Procedures (including critical care time)  Medications  ciprofloxacin (CIPRO) tablet 500 mg (500 mg Oral Given 11/15/14 0331)    DIAGNOSTIC STUDIES: Oxygen Saturation is 99% on room air, normal by my interpretation.    COORDINATION OF CARE: 11:56 PM Discussed treatment plan at bedside. Pt agreed to plan.  Patient had an abnormal first troponin. It was repeated about 3 hours later and was basically the same. This was felt to be an artifact from her dialysis. Patient is not complaining of chest pain or shortness of breath.  Patient had a urine catheter sample obtained which was consistent with possible UTI. There was not enough sample to get a culture.  Review of her admission shows on April 20 she had a cardiac cath to evaluate her for possible surgery of her aortic stenosis.  We discussed her INR was high. She normally gets her level checked on Wednesdays which is on the 11th. She's advised to stop taking her Coumadin until she gets a repeat level and they tell her it is okay to restart it. She is not having any bleeding so she was not kept in the hospital.   Labs Review Results for orders placed or performed during the hospital encounter of 11/14/14  CBC with Differential  Result Value Ref Range   WBC 10.6 (H) 4.0 - 10.5 K/uL   RBC 3.73 (L) 3.87 - 5.11  MIL/uL   Hemoglobin 10.4 (L) 12.0 - 15.0 g/dL   HCT 16.1 (L) 09.6 - 04.5 %   MCV 91.7 78.0 - 100.0 fL   MCH 27.9 26.0 - 34.0 pg   MCHC 30.4 30.0 - 36.0 g/dL   RDW 40.9 (H) 81.1 - 91.4 %  Platelets 249 150 - 400 K/uL   Neutrophils Relative % 46 43 - 77 %   Neutro Abs 4.9 1.7 - 7.7 K/uL   Lymphocytes Relative 22 12 - 46 %   Lymphs Abs 2.3 0.7 - 4.0 K/uL   Monocytes Relative 8 3 - 12 %   Monocytes Absolute 0.8 0.1 - 1.0 K/uL   Eosinophils Relative 23 (H) 0 - 5 %   Eosinophils Absolute 2.4 (H) 0.0 - 0.7 K/uL   Basophils Relative 1 0 - 1 %   Basophils Absolute 0.1 0.0 - 0.1 K/uL  Basic metabolic panel  Result Value Ref Range   Sodium 138 135 - 145 mmol/L   Potassium 3.5 3.5 - 5.1 mmol/L   Chloride 96 (L) 101 - 111 mmol/L   CO2 31 22 - 32 mmol/L   Glucose, Bld 88 70 - 99 mg/dL   BUN 15 6 - 20 mg/dL   Creatinine, Ser 4.693.67 (H) 0.44 - 1.00 mg/dL   Calcium 9.5 8.9 - 62.910.3 mg/dL   GFR calc non Af Amer 10 (L) >60 mL/min   GFR calc Af Amer 12 (L) >60 mL/min   Anion gap 11 5 - 15  Troponin I  Result Value Ref Range   Troponin I 0.15 (H) <0.031 ng/mL  Hepatic function panel  Result Value Ref Range   Total Protein 6.8 6.5 - 8.1 g/dL   Albumin 3.2 (L) 3.5 - 5.0 g/dL   AST 31 15 - 41 U/L   ALT 20 14 - 54 U/L   Alkaline Phosphatase 99 38 - 126 U/L   Total Bilirubin 0.8 0.3 - 1.2 mg/dL   Bilirubin, Direct 0.3 0.1 - 0.5 mg/dL   Indirect Bilirubin 0.5 0.3 - 0.9 mg/dL  Lipase, blood  Result Value Ref Range   Lipase 23 22 - 51 U/L  Urinalysis, Routine w reflex microscopic  Result Value Ref Range   Color, Urine YELLOW YELLOW   APPearance CLOUDY (A) CLEAR   Specific Gravity, Urine 1.020 1.005 - 1.030   pH 7.0 5.0 - 8.0   Glucose, UA 250 (A) NEGATIVE mg/dL   Hgb urine dipstick LARGE (A) NEGATIVE   Bilirubin Urine MODERATE (A) NEGATIVE   Ketones, ur TRACE (A) NEGATIVE mg/dL   Protein, ur >528>300 (A) NEGATIVE mg/dL   Urobilinogen, UA 1.0 0.0 - 1.0 mg/dL   Nitrite NEGATIVE NEGATIVE    Leukocytes, UA MODERATE (A) NEGATIVE  Urine microscopic-add on  Result Value Ref Range   Squamous Epithelial / LPF MANY (A) RARE   WBC, UA 11-20 <3 WBC/hpf   RBC / HPF 0-2 <3 RBC/hpf   Bacteria, UA MANY (A) RARE   Casts HYALINE CASTS (A) NEGATIVE   Urine-Other MICROSCOPIC EXAM PERFORMED ON UNCONCENTRATED URINE   Troponin I  Result Value Ref Range   Troponin I 0.16 (H) <0.031 ng/mL  Protime-INR  Result Value Ref Range   Prothrombin Time 51.6 (H) 11.6 - 15.2 seconds   INR 5.67 (HH) 0.00 - 1.49   Laboratory interpretation all normal except over therapeutic INR, possible UTI, chronic renal disease     Imaging Review Dg Chest 2 View  11/15/2014   CLINICAL DATA:  Generalized weakness, worse this morning.  EXAM: CHEST  2 VIEW  COMPARISON:  10/13/2014  FINDINGS: There is marked cardiomegaly and moderate aortic tortuosity. There is mild vascular fullness and interstitial fluid, consistent with a degree of congestive failure. There is no alveolar edema. There is no effusion.  IMPRESSION: Mild CHF.  Electronically Signed   By: Ellery Plunk [name]   On: 11/15/2014 00:37   Dg Abd 2 Views  11/15/2014   CLINICAL DATA:  Constant generalized weakness, worsening today.  EXAM: ABDOMEN - 2 VIEW  COMPARISON:  None.  FINDINGS: The bowel gas pattern is normal. There is no evidence of free air. No radio-opaque calculi or other significant radiographic abnormality is seen.  IMPRESSION: Negative.   Electronically Signed   By: Ellery Plunk [name]   On: 11/15/2014 00:22     EKG Interpretation   Date/Time:  Monday Nov 14 2014 23:39:41 EDT Ventricular Rate:  57 PR Interval:  182 QRS Duration: 124 QT Interval:  515 QTC Calculation: 501 R Axis:   77 Text Interpretation:  Sinus rhythm LVH with secondary repolarization  abnormality Probable lateral infarct, age indeterminate Prolonged QT  interval No significant change since last tracing 04 Nov 2014 Confirmed by  [facility]  MD-I, Shaleena Crusoe ([id number]) on 11/14/2014  11:59:56 PM      MDM   Final diagnoses:  Cough  Weakness  Urinary tract infection without hematuria, site unspecified   New Prescriptions   CEPHALEXIN (KEFLEX) 500 MG CAPSULE    Take 1 capsule (500 mg total) by mouth 2 (two) times daily.    Plan discharge     I personally performed the services described in this documentation, which was scribed in my presence. The recorded information has been reviewed and considered.  Devoria Albe, MD, Concha Pyo, MD 11/15/14 [phone]

## 2014-11-14 NOTE — ED Notes (Signed)
Pt c/o feeling weak and vomiting that started today; pt had dialysis today

## 2014-11-15 DIAGNOSIS — I509 Heart failure, unspecified: Secondary | ICD-10-CM | POA: Diagnosis not present

## 2014-11-15 DIAGNOSIS — R531 Weakness: Secondary | ICD-10-CM | POA: Diagnosis not present

## 2014-11-15 LAB — URINE MICROSCOPIC-ADD ON

## 2014-11-15 LAB — HEPATIC FUNCTION PANEL
ALT: 20 U/L (ref 14–54)
AST: 31 U/L (ref 15–41)
Albumin: 3.2 g/dL — ABNORMAL LOW (ref 3.5–5.0)
Alkaline Phosphatase: 99 U/L (ref 38–126)
Bilirubin, Direct: 0.3 mg/dL (ref 0.1–0.5)
Indirect Bilirubin: 0.5 mg/dL (ref 0.3–0.9)
Total Bilirubin: 0.8 mg/dL (ref 0.3–1.2)
Total Protein: 6.8 g/dL (ref 6.5–8.1)

## 2014-11-15 LAB — URINALYSIS, ROUTINE W REFLEX MICROSCOPIC
Glucose, UA: 250 mg/dL — AB
NITRITE: NEGATIVE
SPECIFIC GRAVITY, URINE: 1.02 (ref 1.005–1.030)
UROBILINOGEN UA: 1 mg/dL (ref 0.0–1.0)
pH: 7 (ref 5.0–8.0)

## 2014-11-15 LAB — TROPONIN I
Troponin I: 0.15 ng/mL — ABNORMAL HIGH (ref <0.031)
Troponin I: 0.16 ng/mL — ABNORMAL HIGH (ref <0.031)

## 2014-11-15 LAB — PROTIME-INR
INR: 5.67 (ref 0.00–1.49)
PROTHROMBIN TIME: 51.6 s — AB (ref 11.6–15.2)

## 2014-11-15 LAB — LIPASE, BLOOD: Lipase: 23 U/L (ref 22–51)

## 2014-11-15 MED ORDER — CEPHALEXIN 500 MG PO CAPS: 500.0000 mg | ORAL_CAPSULE | Freq: Two times a day (BID) | ORAL | Status: DC

## 2014-11-15 MED ORDER — CIPROFLOXACIN HCL 250 MG PO TABS
500.0000 mg | ORAL_TABLET | Freq: Once | ORAL | Status: AC
  Administered 2014-11-15: 500 mg via ORAL
  Filled 2014-11-15: qty 2

## 2014-11-15 NOTE — Discharge Instructions (Signed)
Take the antibiotics until gone. Do not take any more of your coumadin until you get your level rechecked on Wednesday, May 11th. You INR was 5.7 tonight. Follow up with Dr Loleta ChanceHill about your loss of appetite. You can use dulcolax suppositories OTC to help soften the hard stools in your rectum.

## 2014-11-15 NOTE — ED Notes (Signed)
CRITICAL VALUE ALERT  Critical value received:  inr 5.6  Date of notification:  11/15/14  Time of notification:  0145  Critical value read back:Yes.    Nurse who received alert:  Kathlene CoteJamie Armstrong, RN  MD notified (1st page):  Dr Lynelle Doctorknapp  Time of first page:  0145  MD notified (2nd page):  Time of second page:  Responding MD:  Dr Lynelle Doctorknapp  Time MD responded:  [phone]

## 2014-11-15 NOTE — ED Notes (Signed)
Urine sample obtained from in and out catheter earlier gave very small amount of urine and laboratory states there is not enough urine left to do a urine culture. Dr. Lynelle DoctorKnapp notified. Patient is a dialysis patient and family states patient hardly makes any urine.

## 2014-11-16 ENCOUNTER — Ambulatory Visit (INDEPENDENT_AMBULATORY_CARE_PROVIDER_SITE_OTHER): Payer: Medicare Other | Admitting: *Deleted

## 2014-11-16 DIAGNOSIS — Z992 Dependence on renal dialysis: Secondary | ICD-10-CM | POA: Diagnosis not present

## 2014-11-16 DIAGNOSIS — I480 Paroxysmal atrial fibrillation: Secondary | ICD-10-CM

## 2014-11-16 DIAGNOSIS — Z5181 Encounter for therapeutic drug level monitoring: Secondary | ICD-10-CM | POA: Diagnosis not present

## 2014-11-16 DIAGNOSIS — N186 End stage renal disease: Secondary | ICD-10-CM | POA: Diagnosis not present

## 2014-11-16 DIAGNOSIS — Z7901 Long term (current) use of anticoagulants: Secondary | ICD-10-CM

## 2014-11-16 DIAGNOSIS — N2581 Secondary hyperparathyroidism of renal origin: Secondary | ICD-10-CM | POA: Diagnosis not present

## 2014-11-16 DIAGNOSIS — I4891 Unspecified atrial fibrillation: Secondary | ICD-10-CM | POA: Diagnosis not present

## 2014-11-16 DIAGNOSIS — D631 Anemia in chronic kidney disease: Secondary | ICD-10-CM | POA: Diagnosis not present

## 2014-11-16 LAB — POCT INR: INR: 5.6

## 2014-11-18 DIAGNOSIS — N2581 Secondary hyperparathyroidism of renal origin: Secondary | ICD-10-CM | POA: Diagnosis not present

## 2014-11-18 DIAGNOSIS — D631 Anemia in chronic kidney disease: Secondary | ICD-10-CM | POA: Diagnosis not present

## 2014-11-18 DIAGNOSIS — Z992 Dependence on renal dialysis: Secondary | ICD-10-CM | POA: Diagnosis not present

## 2014-11-18 DIAGNOSIS — N186 End stage renal disease: Secondary | ICD-10-CM | POA: Diagnosis not present

## 2014-11-20 ENCOUNTER — Encounter (HOSPITAL_COMMUNITY): Payer: Self-pay | Admitting: Family Medicine

## 2014-11-20 ENCOUNTER — Emergency Department (HOSPITAL_COMMUNITY): Payer: Medicare Other

## 2014-11-20 ENCOUNTER — Emergency Department (HOSPITAL_COMMUNITY)
Admission: EM | Admit: 2014-11-20 | Discharge: 2014-11-20 | Disposition: A | Discharge status: Home / Self Care | Payer: Medicare Other | Attending: Emergency Medicine | Admitting: Emergency Medicine

## 2014-11-20 ENCOUNTER — Other Ambulatory Visit (HOSPITAL_COMMUNITY): Payer: Self-pay

## 2014-11-20 DIAGNOSIS — Z992 Dependence on renal dialysis: Secondary | ICD-10-CM | POA: Diagnosis not present

## 2014-11-20 DIAGNOSIS — Z792 Long term (current) use of antibiotics: Secondary | ICD-10-CM | POA: Diagnosis not present

## 2014-11-20 DIAGNOSIS — E8779 Other fluid overload: Secondary | ICD-10-CM | POA: Diagnosis not present

## 2014-11-20 DIAGNOSIS — R059 Cough, unspecified: Secondary | ICD-10-CM

## 2014-11-20 DIAGNOSIS — Z79899 Other long term (current) drug therapy: Secondary | ICD-10-CM | POA: Insufficient documentation

## 2014-11-20 DIAGNOSIS — N186 End stage renal disease: Secondary | ICD-10-CM | POA: Diagnosis not present

## 2014-11-20 DIAGNOSIS — R630 Anorexia: Secondary | ICD-10-CM | POA: Insufficient documentation

## 2014-11-20 DIAGNOSIS — Z9861 Coronary angioplasty status: Secondary | ICD-10-CM | POA: Insufficient documentation

## 2014-11-20 DIAGNOSIS — F419 Anxiety disorder, unspecified: Secondary | ICD-10-CM | POA: Insufficient documentation

## 2014-11-20 DIAGNOSIS — I350 Nonrheumatic aortic (valve) stenosis: Secondary | ICD-10-CM | POA: Insufficient documentation

## 2014-11-20 DIAGNOSIS — D649 Anemia, unspecified: Secondary | ICD-10-CM | POA: Diagnosis not present

## 2014-11-20 DIAGNOSIS — I252 Old myocardial infarction: Secondary | ICD-10-CM | POA: Insufficient documentation

## 2014-11-20 DIAGNOSIS — E785 Hyperlipidemia, unspecified: Secondary | ICD-10-CM | POA: Diagnosis not present

## 2014-11-20 DIAGNOSIS — I509 Heart failure, unspecified: Secondary | ICD-10-CM | POA: Diagnosis not present

## 2014-11-20 DIAGNOSIS — I2510 Atherosclerotic heart disease of native coronary artery without angina pectoris: Secondary | ICD-10-CM | POA: Insufficient documentation

## 2014-11-20 DIAGNOSIS — Z9889 Other specified postprocedural states: Secondary | ICD-10-CM | POA: Insufficient documentation

## 2014-11-20 DIAGNOSIS — Z8701 Personal history of pneumonia (recurrent): Secondary | ICD-10-CM | POA: Insufficient documentation

## 2014-11-20 DIAGNOSIS — I120 Hypertensive chronic kidney disease with stage 5 chronic kidney disease or end stage renal disease: Secondary | ICD-10-CM | POA: Diagnosis not present

## 2014-11-20 DIAGNOSIS — R011 Cardiac murmur, unspecified: Secondary | ICD-10-CM | POA: Diagnosis not present

## 2014-11-20 DIAGNOSIS — R05 Cough: Secondary | ICD-10-CM | POA: Diagnosis not present

## 2014-11-20 LAB — COMPREHENSIVE METABOLIC PANEL
ALK PHOS: 97 U/L (ref 38–126)
ALT: 31 U/L (ref 14–54)
AST: 55 U/L — ABNORMAL HIGH (ref 15–41)
Albumin: 2.8 g/dL — ABNORMAL LOW (ref 3.5–5.0)
Anion gap: 15 (ref 5–15)
BUN: 19 mg/dL (ref 6–20)
CALCIUM: 9.5 mg/dL (ref 8.9–10.3)
CO2: 29 mmol/L (ref 22–32)
Chloride: 91 mmol/L — ABNORMAL LOW (ref 101–111)
Creatinine, Ser: 5.19 mg/dL — ABNORMAL HIGH (ref 0.44–1.00)
GFR calc Af Amer: 8 mL/min — ABNORMAL LOW (ref >60)
GFR calc non Af Amer: 7 mL/min — ABNORMAL LOW (ref >60)
GLUCOSE: 90 mg/dL (ref 65–99)
Potassium: 3.8 mmol/L (ref 3.5–5.1)
Sodium: 135 mmol/L (ref 135–145)
Total Bilirubin: 0.8 mg/dL (ref 0.3–1.2)
Total Protein: 6.6 g/dL (ref 6.5–8.1)

## 2014-11-20 LAB — CBC WITH DIFFERENTIAL/PLATELET
Band Neutrophils: 4 % (ref 0–10)
Basophils Absolute: 0.1 10*3/uL (ref 0.0–0.1)
Basophils Relative: 2 % — ABNORMAL HIGH (ref 0–1)
Blasts: 0 %
EOS PCT: 3 % (ref 0–5)
Eosinophils Absolute: 0.2 10*3/uL (ref 0.0–0.7)
HEMATOCRIT: 34.9 % — AB (ref 36.0–46.0)
Hemoglobin: 10.7 g/dL — ABNORMAL LOW (ref 12.0–15.0)
Lymphocytes Relative: 32 % (ref 12–46)
Lymphs Abs: 2.4 10*3/uL (ref 0.7–4.0)
MCH: 27.4 pg (ref 26.0–34.0)
MCHC: 30.7 g/dL (ref 30.0–36.0)
MCV: 89.5 fL (ref 78.0–100.0)
Metamyelocytes Relative: 0 %
Monocytes Absolute: 0.6 10*3/uL (ref 0.1–1.0)
Monocytes Relative: 8 % (ref 3–12)
Myelocytes: 0 %
NEUTROS ABS: 4.1 10*3/uL (ref 1.7–7.7)
Neutrophils Relative %: 51 % (ref 43–77)
Other: 0 %
Platelets: 183 10*3/uL (ref 150–400)
Promyelocytes Absolute: 0 %
RBC: 3.9 MIL/uL (ref 3.87–5.11)
RDW: 22.6 % — AB (ref 11.5–15.5)
WBC: 7.4 10*3/uL (ref 4.0–10.5)
nRBC: 13 /100 WBC — ABNORMAL HIGH

## 2014-11-20 LAB — I-STAT ARTERIAL BLOOD GAS, ED
Acid-Base Excess: 4 mmol/L — ABNORMAL HIGH (ref 0.0–2.0)
Bicarbonate: 27.8 mEq/L — ABNORMAL HIGH (ref 20.0–24.0)
O2 Saturation: 92 %
PO2 ART: 59 mmHg — AB (ref 80.0–100.0)
Patient temperature: 98.6
TCO2: 29 mmol/L (ref 0–100)
pCO2 arterial: 38.6 mmHg (ref 35.0–45.0)
pH, Arterial: 7.465 — ABNORMAL HIGH (ref 7.350–7.450)

## 2014-11-20 LAB — PROTIME-INR
INR: 4.07 — AB (ref 0.00–1.49)
PROTHROMBIN TIME: 39.8 s — AB (ref 11.6–15.2)

## 2014-11-20 LAB — I-STAT TROPONIN, ED: Troponin i, poc: 0.24 ng/mL (ref 0.00–0.08)

## 2014-11-20 LAB — BRAIN NATRIURETIC PEPTIDE: B Natriuretic Peptide: >4500 pg/mL — ABNORMAL HIGH (ref 0.0–100.0)

## 2014-11-20 MED ORDER — LEVOFLOXACIN 500 MG PO TABS
500.0000 mg | ORAL_TABLET | Freq: Once | ORAL | Status: AC
  Administered 2014-11-20: 500 mg via ORAL
  Filled 2014-11-20: qty 1

## 2014-11-20 MED ORDER — ALBUTEROL SULFATE HFA 108 (90 BASE) MCG/ACT IN AERS
2.0000 | INHALATION_SPRAY | RESPIRATORY_TRACT | Status: DC | PRN
  Administered 2014-11-20: 2 via RESPIRATORY_TRACT
  Filled 2014-11-20: qty 6.7

## 2014-11-20 MED ORDER — PREDNISONE 20 MG PO TABS
60.0000 mg | ORAL_TABLET | Freq: Once | ORAL | Status: AC
  Administered 2014-11-20: 60 mg via ORAL
  Filled 2014-11-20: qty 3

## 2014-11-20 MED ORDER — ALBUTEROL SULFATE HFA 108 (90 BASE) MCG/ACT IN AERS: 2.0000 | INHALATION_SPRAY | RESPIRATORY_TRACT | Status: AC | PRN

## 2014-11-20 MED ORDER — PREDNISONE 20 MG PO TABS: 60.0000 mg | ORAL_TABLET | Freq: Every day | ORAL | Status: AC

## 2014-11-20 MED ORDER — HYDROCOD POLST-CPM POLST ER 10-8 MG/5ML PO SUER: 5.0000 mL | Freq: Two times a day (BID) | ORAL | Status: DC | PRN

## 2014-11-20 MED ORDER — HYDROCOD POLST-CPM POLST ER 10-8 MG/5ML PO SUER
5.0000 mL | Freq: Once | ORAL | Status: AC
  Administered 2014-11-20: 5 mL via ORAL
  Filled 2014-11-20: qty 5

## 2014-11-20 MED ORDER — LEVOFLOXACIN 500 MG PO TABS: 500.0000 mg | ORAL_TABLET | Freq: Once | ORAL | Status: DC

## 2014-11-20 NOTE — ED Notes (Signed)
Pt off unit with xray 

## 2014-11-20 NOTE — ED Provider Notes (Addendum)
CSN: [id number]     Arrival date & time 11/20/14  0920 History   First MD Initiated Contact with Patient 11/20/14 [phone]     Chief Complaint  Patient presents with  . Cough     (Consider location/radiation/quality/duration/timing/severity/associated sxs/prior Treatment) HPI The patient is an 81 year old female who presents today with family members stating that she has increased dyspnea and cough for the past 1-2 weeks. She was hospitalized in April with healthcare associated pneumonia. She is a dialysis patient and goes to dialysis Monday Wednesday and Friday. They have not noted any increase in weight. She has had cough which has been productive at times although they're unable to tell me what it is like. She has had some chills noted. They have not noted any fever. She has had decreased by mouth intake. She makes a very small amount of urine. She was seen 5 days ago in the emergency department for similar complaints. At that time, she was noted to have a urinary tract infection. Due to the small amount of urine that they were able to obtain this was not cultured. She was placed on Keflex. Patient denies any pain. She has had ongoing coughing and it is worse when she lays down. She has a history of aortic stenosis and was evaluated by Dr. Gery Pray. Family reports that she was told that she was not a candidate for valve replacement.  From cardiac surgery evaluation 11/09/14 Impression:  This 81 year old frail woman on hemodialysis has moderate to severe AS by echo. Her valve is heavily calcified with restricted leaflet motion but her mean gradient by echo is only 33 mm Hg and 17 mm Hg at cath. She also has a very calcified mitral valve with moderate mitral stenosis and mitral regurgitation. She has stents in her RCA and LCX with recent restenosis in the RCA stent requiring PCI. It is difficult to tell how symptomatic she is because her daughter says that she does not do anything but sleep and go to  dialysis. She does not eat and is severely malnourished with an albumin of 2.3. Her decline has been going on for a couple years and I don't think AVR is going to change that. She would be very high risk even for TAVR and would likely not make a functional recovery. In addition I think her mitral valve disease is almost as significant as her aortic valve disease. Her prognosis for survival over the next few years is poor and I would recommend palliative medical therapy. I reviewed all of the studies with her and her daughter and discussed my recommendation for palliative medical care. They seem to understand. All of their questions have been answered.   Plan:  She will continue to follow up with her PCP, nephrologist and Dr. Allyson Sabal.    Alleen Borne, MD  The patient is an 81 year old woman with end stage kidney disease on hemodialysis for the past 4 years, CAD s/p PCI and stenting of the RCA and LCX in 2014, HTN, hyperlipidemia, atrial fibrillation on coumadin and chronic diastolic heart failure and aortic stenosis. She has been followed by Dr. Allyson Sabal and had an echo on 08/18/2014 showing normal LVEF of 60-65% with a mean gradient across the aortic valve of 33 mm Hg and a peak gradient of 55 mm Hg. The AVA is 0.65 - 0.73 cm2. Her previous echo in 05/2013 showed a mean gradient of 17 mm Hg with an AVA of 1.2 cm2. [facility] then  [facility] with volume overload, pulmonary edema and shortness of breath and was found to be in atrial fibrillation with rapid ventricular response. Her amiodarone was increased and she returned to normal sinus rhythm. Volume overload was managed with dialysis. She subsequently underwent R and L heart cath on 11/03/2014 which showed a mean AV gradient of 17 mm Hg and a peak to peak gradient of 17 mm Hg. The AVA was calculated at 1.33 cm2. Coronary angiography showed a 95% restenosis in the ostial and proximal stented segment of the RCA. There are moderate stenoses in the  LAD, LCX and PDA/PL. 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  She is here with one of her daughters today. She says she feels the same as she always does but her daughter says she has been doing poorly for a long time. She sleeps all day, refuses to eat and can't get up out of a chair without help. She only walks a very short distance in her house and mainly uses a wheelchair to go to dialysis. She does not leave the house for any other activities. She denies chest pain but gets short of breath and fatigued with with any exertion.     Past Medical History  Diagnosis Date  . Anemia 03/06/2011  . Hypertension   . Coronary artery disease     PCI 2014, 2016  . Aortic stenosis, mild     severe aortic stenosis  . A-fib   . Acute CHF   . Hyperlipidemia   . Myocardial infarction 2014  . Heart murmur   . Pneumonia 10/2014  . Anxiety   . ESRD (end stage renal disease) on dialysis     "MWF; [facility]; [place]" (11/03/2014)   Past Surgical History  Procedure Laterality Date  . Av fistula placement Left ~ 2012    upper arm arm  . Forearm fracture surgery Bilateral     "fell in the basement"  . Fracture surgery    . Pseudoaneurysm repair Right 05/2013    leg  . Cataract extraction w/phaco Right 10/19/2013    Procedure: CATARACT EXTRACTION PHACO AND INTRAOCULAR LENS PLACEMENT (IOC);  Surgeon: Loraine LericheMark T. Nile RiggsShapiro, MD;  Location: [facility];  Service: Ophthalmology;  Laterality: Right;  CDE:15.66  . Cataract extraction w/phaco Left 11/02/2013    Procedure: CATARACT EXTRACTION PHACO AND INTRAOCULAR LENS PLACEMENT (IOC);  Surgeon: Loraine LericheMark T. Nile RiggsShapiro, MD;  Location: [facility];  Service: Ophthalmology;  Laterality: Left;  CDE:8.78  . Left and right heart catheterization with coronary angiogram N/A 05/11/2013    Procedure: LEFT AND RIGHT HEART CATHETERIZATION WITH CORONARY ANGIOGRAM;  Surgeon: Runell GessJonathan J Berry, MD;  [facility];  Service: Cardiovascular;  Laterality: N/A;  . Percutaneous coronary rotoblator intervention (pci-r) N/A 05/14/2013    Procedure: PERCUTANEOUS CORONARY ROTOBLATOR INTERVENTION (PCI-R);  Surgeon: Lennette Biharihomas A Kelly, MD;  [facility];  Service: Cardiovascular;  Laterality: N/A;  . Percutaneous coronary rotoblator intervention (pci-r) N/A 05/19/2013    Procedure: PERCUTANEOUS CORONARY ROTOBLATOR INTERVENTION (PCI-R);  Surgeon: Lennette Biharihomas A Kelly, MD;  [facility];  Service: Cardiovascular;  Laterality: N/A;  . Cardiac catheterization  ; 11/03/2014  . Coronary angioplasty with stent placement    . Cataract extraction w/ intraocular lens  implant, bilateral Bilateral 2015  . Left and right heart catheterization with coronary angiogram N/A 11/03/2014    Procedure: LEFT AND RIGHT HEART CATHETERIZATION WITH CORONARY ANGIOGRAM;  Surgeon: Kathleene Hazelhristopher D McAlhany, MD;  [facility];  Service: Cardiovascular;  Laterality: N/A;   Family History  Problem Relation Age of Onset  . Heart attack Sister    History  Substance Use Topics  . Smoking status: Never Smoker   . Smokeless tobacco: Never Used  . Alcohol Use: No   OB History    No data available     Review of Systems    Allergies  Review of patient's allergies indicates no known allergies.  Home Medications   Prior to Admission medications   Medication Sig Start Date End Date Taking? Authorizing Provider  acetaminophen (TYLENOL) 325 MG tablet Take 2 tablets (650 mg total) by mouth every 4 (four) hours as needed for headache or mild pain. 11/04/14   Abelino DerrickLuke K Kilroy, PA-C  ALPRAZolam (XANAX) 0.25 MG tablet Take 1 tablet (0.25 mg total) by mouth 2 (two) times daily as needed for anxiety. 10/20/14   Kathleene Hazelhristopher D McAlhany, MD  amiodarone (PACERONE) 200 MG tablet Take 1 tablet (200 mg total) by mouth daily. 10/18/14   Kathleene Hazelhristopher D McAlhany, MD  atorvastatin (LIPITOR) 40 MG tablet Take 40 mg by mouth daily.      Historical  Provider, MD  cephALEXin (KEFLEX) 500 MG capsule Take 1 capsule (500 mg total) by mouth 2 (two) times daily. 11/15/14   Devoria AlbeIva Knapp, MD  clopidogrel (PLAVIX) 75 MG tablet TAKE ONE (1) TABLET EACH DAY 09/01/14   Runell GessJonathan J Berry, MD  darbepoetin (ARANESP) 100 MCG/0.5ML SOLN injection Inject 100 mcg into the skin See admin instructions. Takes every Monday, Wednesday and Friday with dialysis    Historical Provider, MD  doxercalciferol (HECTOROL) 4 MCG/2ML injection Inject 3.5 mcg into the vein every Monday, Wednesday, and Friday with hemodialysis.    Historical Provider, MD  folic acid (FOLVITE) 1 MG tablet Take 1 mg by mouth daily.    Historical Provider, MD  metoprolol tartrate (LOPRESSOR) 25 MG tablet TAKE ONE TABLET BY MOUTH TWICE A DAY 03/11/14   Chrystie NoseKenneth C Hilty, MD  multivitamin (RENA-VIT) TABS tablet Take 1 tablet by mouth daily.    Historical Provider, MD  nitroGLYCERIN (NITROSTAT) 0.4 MG SL tablet Place 1 tablet (0.4 mg total) under the tongue every 5 (five) minutes as needed for chest pain. 11/04/14   Abelino DerrickLuke K Kilroy, PA-C  sevelamer carbonate (RENVELA) 800 MG tablet Take 1,600 mg by mouth 2 (two) times daily with a meal.     Historical Provider, MD  sodium bicarbonate 325 MG tablet Take 325 mg by mouth 2 (two) times daily.    Historical Provider, MD  traMADol (ULTRAM) 50 MG tablet Take 50 mg by mouth every 6 (six) hours as needed.    Historical Provider, MD  warfarin (COUMADIN) 3 MG tablet Take 0.5-1 tablets (1.5-3 mg total) by mouth daily. Takes 1.5 mg on Sun, Tue, Wed, Thu, and Sat. Takes 3 mg on Mon & Fri.- 11/05/14   Eda PaschalLuke K Kilroy, PA-C   BP 100/60 mmHg  Pulse 60  Temp(Src) 97.9 F (36.6 C)  Resp 18  SpO2 95% Physical Exam  Constitutional: She is oriented to person, place, and time. She appears well-developed.  Chronically ill appearing elderly female  HENT:  Head: Normocephalic and atraumatic.  Right Ear: External ear normal.  Left Ear: External ear normal.  Mouth/Throat: Oropharynx is  clear and moist.  Eyes: EOM are normal.  Neck: Normal range of motion. Neck supple.  Cardiovascular: Normal rate.   Murmur heard.  Systolic murmur  is present with a grade of 4/6  Pulmonary/Chest: Effort normal. She has decreased breath sounds in the right lower field and the left lower field.  Abdominal: Soft. Bowel sounds are normal.  Musculoskeletal: She exhibits edema. She exhibits no tenderness.  Neurological: She is alert and oriented to person, place, and time. She displays normal reflexes. No cranial nerve deficit. Coordination normal.  Skin: Skin is warm and dry.  Psychiatric: She has a normal mood and affect.  Nursing note and vitals reviewed.   ED Course  Procedures (including critical care time) Labs Review Labs Reviewed  CBC WITH DIFFERENTIAL/PLATELET - Abnormal; Notable for the following:    Hemoglobin 10.7 (*)    HCT 34.9 (*)    RDW 22.6 (*)    All other components within normal limits  COMPREHENSIVE METABOLIC PANEL - Abnormal; Notable for the following:    Chloride 91 (*)    Creatinine, Ser 5.19 (*)    Albumin 2.8 (*)    AST 55 (*)    GFR calc non Af Amer 7 (*)    GFR calc Af Amer 8 (*)    All other components within normal limits  PROTIME-INR - Abnormal; Notable for the following:    Prothrombin Time 39.8 (*)    INR 4.07 (*)    All other components within normal limits  I-STAT ARTERIAL BLOOD GAS, ED - Abnormal; Notable for the following:    pH, Arterial 7.465 (*)    pO2, Arterial 59.0 (*)    Bicarbonate 27.8 (*)    Acid-Base Excess 4.0 (*)    All other components within normal limits  CULTURE, BLOOD (ROUTINE X 2)  CULTURE, BLOOD (ROUTINE X 2)  BRAIN NATRIURETIC PEPTIDE  I-STAT TROPOININ, ED    Imaging Review Dg Chest 2 View  11/20/2014   CLINICAL DATA:  Cough. Atrial fibrillation. End-stage renal disease.  EXAM: CHEST  2 VIEW  COMPARISON:  11/14/2014  FINDINGS: The heart is markedly enlarged. Hazy central and basilar airspace disease and low lung  volumes. No pneumothorax. Tiny pleural effusions are suspected. Upper thoracic compression deformities are stable.  IMPRESSION: Cardiomegaly and bibasilar edema.   Electronically Signed   By: Jolaine Click [name]   On: 11/20/2014 10:57     EKG Interpretation None     ED ECG REPORT   Date: 11/20/2014  Rate: 58  Rhythm: normal sinus rhythm  QRS Axis: right  Intervals: QT prolonged  ST/T Wave abnormalities: nonspecific ST/T changes  Conduction Disutrbances:none  Narrative Interpretation:   Old EKG Reviewed: unchanged  I have personally reviewed the EKG tracing and agree with the computerized printout as noted.  MDM   Final diagnoses:  Cough  Aortic stenosis  Other hypervolemia    81 year old female with cough, known aortic stenosis not a surgical candidate, and end-stage renal disease on dialysis. Workup here reveals some increased interstitial markings and small bilateral effusions. She has had a blood gas done that shows no evidence of respiratory failure today. She is oxygenating well with PO2 of 58 on room air and PCO2 of 38. She is on dialysis on on Monday Wednesday Friday. Last note regarding her aortic stenosis from surgery suggest palliative care. I have discussed palliative care and broached DO NOT RESUSCITATE status with the patient and her 2 daughters. Patient's chief complaint is her coughing. She may have some bronchitis and will be placed back on antibiotics and prednisone to help with her symptoms. I had an extensive discussion with the patient and her  family members regarding palliative care. I consult cardiology and Dr. Myrtis Ser does not feel that we should actively continue to evaluate the elevated troponin level given that she has renal failure and aortic stenosis both of which can elevate her troponin. She is not having active chest pain and her EKG is stable from before. I have advised that they follow-up with her primary care provider, Dr. Loleta Chance, and discuss palliative  care.  Margarita Grizzle, MD 11/20/14 1600  Margarita Grizzle, MD 11/29/14 [phone]

## 2014-11-20 NOTE — ED Notes (Signed)
Pt here for non productive cough x 1 week.

## 2014-11-20 NOTE — ED Notes (Signed)
I stat troponin results given to Dr. Ray by B. Leisa Gault, EMT 

## 2014-11-20 NOTE — Discharge Instructions (Signed)
Please call Dr. Loleta ChanceHill in morning to follow-up regarding palliative care. Use Tussionex as prescribed if needed for coughing. Use albuterol inhaler if it decreases cough.

## 2014-11-20 NOTE — ED Notes (Signed)
md at bedside

## 2014-11-21 ENCOUNTER — Ambulatory Visit (INDEPENDENT_AMBULATORY_CARE_PROVIDER_SITE_OTHER): Payer: Medicare Other | Admitting: *Deleted

## 2014-11-21 DIAGNOSIS — Z7901 Long term (current) use of anticoagulants: Secondary | ICD-10-CM

## 2014-11-21 DIAGNOSIS — I4891 Unspecified atrial fibrillation: Secondary | ICD-10-CM | POA: Diagnosis not present

## 2014-11-21 DIAGNOSIS — Z5181 Encounter for therapeutic drug level monitoring: Secondary | ICD-10-CM | POA: Diagnosis not present

## 2014-11-21 DIAGNOSIS — I480 Paroxysmal atrial fibrillation: Secondary | ICD-10-CM | POA: Diagnosis not present

## 2014-11-21 DIAGNOSIS — N2581 Secondary hyperparathyroidism of renal origin: Secondary | ICD-10-CM | POA: Diagnosis not present

## 2014-11-21 DIAGNOSIS — Z992 Dependence on renal dialysis: Secondary | ICD-10-CM | POA: Diagnosis not present

## 2014-11-21 DIAGNOSIS — D631 Anemia in chronic kidney disease: Secondary | ICD-10-CM | POA: Diagnosis not present

## 2014-11-21 DIAGNOSIS — N186 End stage renal disease: Secondary | ICD-10-CM | POA: Diagnosis not present

## 2014-11-21 LAB — POCT INR: INR: 4.5

## 2014-11-23 DIAGNOSIS — Z992 Dependence on renal dialysis: Secondary | ICD-10-CM | POA: Diagnosis not present

## 2014-11-23 DIAGNOSIS — N186 End stage renal disease: Secondary | ICD-10-CM | POA: Diagnosis not present

## 2014-11-23 DIAGNOSIS — D631 Anemia in chronic kidney disease: Secondary | ICD-10-CM | POA: Diagnosis not present

## 2014-11-23 DIAGNOSIS — N2581 Secondary hyperparathyroidism of renal origin: Secondary | ICD-10-CM | POA: Diagnosis not present

## 2014-11-23 LAB — URINE CULTURE
Colony Count: NO GROWTH
Culture: NO GROWTH

## 2014-11-24 DIAGNOSIS — I130 Hypertensive heart and chronic kidney disease with heart failure and stage 1 through stage 4 chronic kidney disease, or unspecified chronic kidney disease: Secondary | ICD-10-CM | POA: Diagnosis not present

## 2014-11-25 DIAGNOSIS — D631 Anemia in chronic kidney disease: Secondary | ICD-10-CM | POA: Diagnosis not present

## 2014-11-25 DIAGNOSIS — N186 End stage renal disease: Secondary | ICD-10-CM | POA: Diagnosis not present

## 2014-11-25 DIAGNOSIS — Z992 Dependence on renal dialysis: Secondary | ICD-10-CM | POA: Diagnosis not present

## 2014-11-25 DIAGNOSIS — N2581 Secondary hyperparathyroidism of renal origin: Secondary | ICD-10-CM | POA: Diagnosis not present

## 2014-11-26 LAB — CULTURE, BLOOD (ROUTINE X 2)
CULTURE: NO GROWTH
CULTURE: NO GROWTH

## 2014-11-28 ENCOUNTER — Ambulatory Visit (INDEPENDENT_AMBULATORY_CARE_PROVIDER_SITE_OTHER): Payer: Medicare Other | Admitting: *Deleted

## 2014-11-28 DIAGNOSIS — Z7901 Long term (current) use of anticoagulants: Secondary | ICD-10-CM

## 2014-11-28 DIAGNOSIS — Z992 Dependence on renal dialysis: Secondary | ICD-10-CM | POA: Diagnosis not present

## 2014-11-28 DIAGNOSIS — N2581 Secondary hyperparathyroidism of renal origin: Secondary | ICD-10-CM | POA: Diagnosis not present

## 2014-11-28 DIAGNOSIS — I4891 Unspecified atrial fibrillation: Secondary | ICD-10-CM | POA: Diagnosis not present

## 2014-11-28 DIAGNOSIS — Z5181 Encounter for therapeutic drug level monitoring: Secondary | ICD-10-CM

## 2014-11-28 DIAGNOSIS — I480 Paroxysmal atrial fibrillation: Secondary | ICD-10-CM

## 2014-11-28 DIAGNOSIS — D631 Anemia in chronic kidney disease: Secondary | ICD-10-CM | POA: Diagnosis not present

## 2014-11-28 DIAGNOSIS — N186 End stage renal disease: Secondary | ICD-10-CM | POA: Diagnosis not present

## 2014-11-28 LAB — POCT INR: INR: 2.2

## 2014-11-29 ENCOUNTER — Other Ambulatory Visit: Payer: Self-pay | Admitting: Internal Medicine

## 2014-11-29 NOTE — Telephone Encounter (Signed)
Rx(s) sent to pharmacy electronically.  

## 2014-11-30 DIAGNOSIS — N2581 Secondary hyperparathyroidism of renal origin: Secondary | ICD-10-CM | POA: Diagnosis not present

## 2014-11-30 DIAGNOSIS — Z992 Dependence on renal dialysis: Secondary | ICD-10-CM | POA: Diagnosis not present

## 2014-11-30 DIAGNOSIS — D631 Anemia in chronic kidney disease: Secondary | ICD-10-CM | POA: Diagnosis not present

## 2014-11-30 DIAGNOSIS — N186 End stage renal disease: Secondary | ICD-10-CM | POA: Diagnosis not present

## 2014-12-02 DIAGNOSIS — D631 Anemia in chronic kidney disease: Secondary | ICD-10-CM | POA: Diagnosis not present

## 2014-12-02 DIAGNOSIS — N186 End stage renal disease: Secondary | ICD-10-CM | POA: Diagnosis not present

## 2014-12-02 DIAGNOSIS — Z992 Dependence on renal dialysis: Secondary | ICD-10-CM | POA: Diagnosis not present

## 2014-12-02 DIAGNOSIS — N2581 Secondary hyperparathyroidism of renal origin: Secondary | ICD-10-CM | POA: Diagnosis not present

## 2014-12-05 DIAGNOSIS — Z992 Dependence on renal dialysis: Secondary | ICD-10-CM | POA: Diagnosis not present

## 2014-12-05 DIAGNOSIS — N2581 Secondary hyperparathyroidism of renal origin: Secondary | ICD-10-CM | POA: Diagnosis not present

## 2014-12-05 DIAGNOSIS — D631 Anemia in chronic kidney disease: Secondary | ICD-10-CM | POA: Diagnosis not present

## 2014-12-05 DIAGNOSIS — N186 End stage renal disease: Secondary | ICD-10-CM | POA: Diagnosis not present

## 2014-12-06 DIAGNOSIS — Z992 Dependence on renal dialysis: Secondary | ICD-10-CM | POA: Diagnosis not present

## 2014-12-06 DIAGNOSIS — N186 End stage renal disease: Secondary | ICD-10-CM | POA: Diagnosis not present

## 2014-12-07 ENCOUNTER — Emergency Department (HOSPITAL_COMMUNITY): Payer: Medicare Other

## 2014-12-07 ENCOUNTER — Encounter (HOSPITAL_COMMUNITY): Payer: Self-pay | Admitting: Emergency Medicine

## 2014-12-07 ENCOUNTER — Telehealth: Payer: Self-pay | Admitting: *Deleted

## 2014-12-07 ENCOUNTER — Emergency Department (HOSPITAL_COMMUNITY)
Admission: EM | Admit: 2014-12-07 | Discharge: 2014-12-07 | Disposition: A | Discharge status: Home / Self Care | Payer: Medicare Other | Attending: Emergency Medicine | Admitting: Emergency Medicine

## 2014-12-07 DIAGNOSIS — Z7902 Long term (current) use of antithrombotics/antiplatelets: Secondary | ICD-10-CM | POA: Diagnosis not present

## 2014-12-07 DIAGNOSIS — Z7901 Long term (current) use of anticoagulants: Secondary | ICD-10-CM | POA: Diagnosis not present

## 2014-12-07 DIAGNOSIS — S99921A Unspecified injury of right foot, initial encounter: Secondary | ICD-10-CM | POA: Diagnosis not present

## 2014-12-07 DIAGNOSIS — M79672 Pain in left foot: Secondary | ICD-10-CM | POA: Diagnosis not present

## 2014-12-07 DIAGNOSIS — R059 Cough, unspecified: Secondary | ICD-10-CM

## 2014-12-07 DIAGNOSIS — Z992 Dependence on renal dialysis: Secondary | ICD-10-CM | POA: Insufficient documentation

## 2014-12-07 DIAGNOSIS — I120 Hypertensive chronic kidney disease with stage 5 chronic kidney disease or end stage renal disease: Secondary | ICD-10-CM | POA: Diagnosis not present

## 2014-12-07 DIAGNOSIS — I2510 Atherosclerotic heart disease of native coronary artery without angina pectoris: Secondary | ICD-10-CM | POA: Diagnosis not present

## 2014-12-07 DIAGNOSIS — W19XXXA Unspecified fall, initial encounter: Secondary | ICD-10-CM

## 2014-12-07 DIAGNOSIS — E785 Hyperlipidemia, unspecified: Secondary | ICD-10-CM | POA: Diagnosis not present

## 2014-12-07 DIAGNOSIS — Z8701 Personal history of pneumonia (recurrent): Secondary | ICD-10-CM | POA: Insufficient documentation

## 2014-12-07 DIAGNOSIS — Y9289 Other specified places as the place of occurrence of the external cause: Secondary | ICD-10-CM | POA: Diagnosis not present

## 2014-12-07 DIAGNOSIS — F419 Anxiety disorder, unspecified: Secondary | ICD-10-CM | POA: Diagnosis not present

## 2014-12-07 DIAGNOSIS — N186 End stage renal disease: Secondary | ICD-10-CM | POA: Insufficient documentation

## 2014-12-07 DIAGNOSIS — I252 Old myocardial infarction: Secondary | ICD-10-CM | POA: Insufficient documentation

## 2014-12-07 DIAGNOSIS — S99922A Unspecified injury of left foot, initial encounter: Secondary | ICD-10-CM | POA: Insufficient documentation

## 2014-12-07 DIAGNOSIS — Y9389 Activity, other specified: Secondary | ICD-10-CM | POA: Diagnosis not present

## 2014-12-07 DIAGNOSIS — Z79899 Other long term (current) drug therapy: Secondary | ICD-10-CM | POA: Diagnosis not present

## 2014-12-07 DIAGNOSIS — D649 Anemia, unspecified: Secondary | ICD-10-CM | POA: Insufficient documentation

## 2014-12-07 DIAGNOSIS — R05 Cough: Secondary | ICD-10-CM | POA: Diagnosis not present

## 2014-12-07 DIAGNOSIS — W06XXXA Fall from bed, initial encounter: Secondary | ICD-10-CM | POA: Diagnosis not present

## 2014-12-07 DIAGNOSIS — M79671 Pain in right foot: Secondary | ICD-10-CM

## 2014-12-07 DIAGNOSIS — I509 Heart failure, unspecified: Secondary | ICD-10-CM | POA: Insufficient documentation

## 2014-12-07 DIAGNOSIS — R011 Cardiac murmur, unspecified: Secondary | ICD-10-CM | POA: Insufficient documentation

## 2014-12-07 DIAGNOSIS — S0990XA Unspecified injury of head, initial encounter: Secondary | ICD-10-CM | POA: Diagnosis not present

## 2014-12-07 DIAGNOSIS — Y998 Other external cause status: Secondary | ICD-10-CM | POA: Diagnosis not present

## 2014-12-07 DIAGNOSIS — S199XXA Unspecified injury of neck, initial encounter: Secondary | ICD-10-CM | POA: Diagnosis not present

## 2014-12-07 MED ORDER — LEVOFLOXACIN 500 MG PO TABS: 500.0000 mg | ORAL_TABLET | Freq: Every day | ORAL | Status: DC

## 2014-12-07 NOTE — Discharge Instructions (Signed)
You may take Tylenol 650 mg every 6 hours as needed for pain.   Cough, Adult  A cough is a reflex that helps clear your throat and airways. It can help heal the body or may be a reaction to an irritated airway. A cough may only last 2 or 3 weeks (acute) or may last more than 8 weeks (chronic).  CAUSES Acute cough:  Viral or bacterial infections. Chronic cough:  Infections.  Allergies.  Asthma.  Post-nasal drip.  Smoking.  Heartburn or acid reflux.  Some medicines.  Chronic lung problems (COPD).  Cancer. SYMPTOMS   Cough.  Fever.  Chest pain.  Increased breathing rate.  High-pitched whistling sound when breathing (wheezing).  Colored mucus that you cough up (sputum). TREATMENT   A bacterial cough may be treated with antibiotic medicine.  A viral cough must run its course and will not respond to antibiotics.  Your caregiver may recommend other treatments if you have a chronic cough. HOME CARE INSTRUCTIONS   Only take over-the-counter or prescription medicines for pain, discomfort, or fever as directed by your caregiver. Use cough suppressants only as directed by your caregiver.  Use a cold steam vaporizer or humidifier in your bedroom or home to help loosen secretions.  Sleep in a semi-upright position if your cough is worse at night.  Rest as needed.  Stop smoking if you smoke. SEEK IMMEDIATE MEDICAL CARE IF:   You have pus in your sputum.  Your cough starts to worsen.  You cannot control your cough with suppressants and are losing sleep.  You begin coughing up blood.  You have difficulty breathing.  You develop pain which is getting worse or is uncontrolled with medicine.  You have a fever. MAKE SURE YOU:   Understand these instructions.  Will watch your condition.  Will get help right away if you are not doing well or get worse. Document Released: 12/21/2010 Document Revised: 09/16/2011 Document Reviewed: 12/21/2010 [facility] Patient  Information 2015 [place]. This information is not intended to replace advice given to you by your health care provider. Make sure you discuss any questions you have with your health care provider.   Possible Pneumonia Pneumonia is an infection of the lungs.  CAUSES Pneumonia may be caused by bacteria or a virus. Usually, these infections are caused by breathing infectious particles into the lungs (respiratory tract). SIGNS AND SYMPTOMS   Cough.  Fever.  Chest pain.  Increased rate of breathing.  Wheezing.  Mucus production. DIAGNOSIS  If you have the common symptoms of pneumonia, your health care provider will typically confirm the diagnosis with a chest X-ray. The X-ray will show an abnormality in the lung (pulmonary infiltrate) if you have pneumonia. Other tests of your blood, urine, or sputum may be done to find the specific cause of your pneumonia. Your health care provider may also do tests (blood gases or pulse oximetry) to see how well your lungs are working. TREATMENT  Some forms of pneumonia may be spread to other people when you cough or sneeze. You may be asked to wear a mask before and during your exam. Pneumonia that is caused by bacteria is treated with antibiotic medicine. Pneumonia that is caused by the influenza virus may be treated with an antiviral medicine. Most other viral infections must run their course. These infections will not respond to antibiotics.  HOME CARE INSTRUCTIONS   Cough suppressants may be used if you are losing too much rest. However, coughing protects you by clearing  your lungs. You should avoid using cough suppressants if you can.  Your health care provider may have prescribed medicine if he or she thinks your pneumonia is caused by bacteria or influenza. Finish your medicine even if you start to feel better.  Your health care provider may also prescribe an expectorant. This loosens the mucus to be coughed up.  Take medicines only as  directed by your health care provider.  Do not smoke. Smoking is a common cause of bronchitis and can contribute to pneumonia. If you are a smoker and continue to smoke, your cough may last several weeks after your pneumonia has cleared.  A cold steam vaporizer or humidifier in your room or home may help loosen mucus.  Coughing is often worse at night. Sleeping in a semi-upright position in a recliner or using a couple pillows under your head will help with this.  Get rest as you feel it is needed. Your body will usually let you know when you need to rest. PREVENTION A pneumococcal shot (vaccine) is available to prevent a common bacterial cause of pneumonia. This is usually suggested for:  People over 53 years old.  Patients on chemotherapy.  People with chronic lung problems, such as bronchitis or emphysema.  People with immune system problems. If you are over 65 or have a high risk condition, you may receive the pneumococcal vaccine if you have not received it before. In some countries, a routine influenza vaccine is also recommended. This vaccine can help prevent some cases of pneumonia.You may be offered the influenza vaccine as part of your care. If you smoke, it is time to quit. You may receive instructions on how to stop smoking. Your health care provider can provide medicines and counseling to help you quit. SEEK MEDICAL CARE IF: You have a fever. SEEK IMMEDIATE MEDICAL CARE IF:   Your illness becomes worse. This is especially true if you are elderly or weakened from any other disease.  You cannot control your cough with suppressants and are losing sleep.  You begin coughing up blood.  You develop pain which is getting worse or is uncontrolled with medicines.  Any of the symptoms which initially brought you in for treatment are getting worse rather than better.  You develop shortness of breath or chest pain. MAKE SURE YOU:   Understand these instructions.  Will watch  your condition.  Will get help right away if you are not doing well or get worse. Document Released: 06/24/2005 Document Revised: 11/08/2013 Document Reviewed: 09/13/2010 [facility] Patient Information 2015 [place], [place]. This information is not intended to replace advice given to you by your health care provider. Make sure you discuss any questions you have with your health care provider.  Possible Head Injury You have received a head injury. It does not appear serious at this time. Headaches and vomiting are common following head injury. It should be easy to awaken from sleeping. Sometimes it is necessary for you to stay in the emergency department for a while for observation. Sometimes admission to the hospital may be needed. After injuries such as yours, most problems occur within the first 24 hours, but side effects may occur up to 7-10 days after the injury. It is important for you to carefully monitor your condition and contact your health care provider or seek immediate medical care if there is a change in your condition. WHAT ARE THE TYPES OF HEAD INJURIES? Head injuries can be as minor as a bump. Some head injuries  can be more severe. More severe head injuries include:  A jarring injury to the brain (concussion).  A bruise of the brain (contusion). This mean there is bleeding in the brain that can cause swelling.  A cracked skull (skull fracture).  Bleeding in the brain that collects, clots, and forms a bump (hematoma). WHAT CAUSES A HEAD INJURY? A serious head injury is most likely to happen to someone who is in a car wreck and is not wearing a seat belt. Other causes of major head injuries include bicycle or motorcycle accidents, sports injuries, and falls. HOW ARE HEAD INJURIES DIAGNOSED? A complete history of the event leading to the injury and your current symptoms will be helpful in diagnosing head injuries. Many times, pictures of the brain, such as CT or MRI are needed to see  the extent of the injury. Often, an overnight hospital stay is necessary for observation.  WHEN SHOULD I SEEK IMMEDIATE MEDICAL CARE?  You should get help right away if:  You have confusion or drowsiness.  You feel sick to your stomach (nauseous) or have continued, forceful vomiting.  You have dizziness or unsteadiness that is getting worse.  You have severe, continued headaches not relieved by medicine. Only take over-the-counter or prescription medicines for pain, fever, or discomfort as directed by your health care provider.  You do not have normal function of the arms or legs or are unable to walk.  You notice changes in the black spots in the center of the colored part of your eye (pupil).  You have a clear or bloody fluid coming from your nose or ears.  You have a loss of vision. During the next 24 hours after the injury, you must stay with someone who can watch you for the warning signs. This person should contact local emergency services (911 in the [place] if you have seizures, you become unconscious, or you are unable to wake up. HOW CAN I PREVENT A HEAD INJURY IN THE FUTURE? The most important factor for preventing major head injuries is avoiding motor vehicle accidents. To minimize the potential for damage to your head, it is crucial to wear seat belts while riding in motor vehicles. Wearing helmets while bike riding and playing collision sports (like football) is also helpful. Also, avoiding dangerous activities around the house will further help reduce your risk of head injury.  WHEN CAN I RETURN TO NORMAL ACTIVITIES AND ATHLETICS? You should be reevaluated by your health care provider before returning to these activities. If you have any of the following symptoms, you should not return to activities or contact sports until 1 week after the symptoms have stopped:  Persistent headache.  Dizziness or vertigo.  Poor attention and concentration.  Confusion.  Memory  problems.  Nausea or vomiting.  Fatigue or tire easily.  Irritability.  Intolerant of bright lights or loud noises.  Anxiety or depression.  Disturbed sleep. MAKE SURE YOU:   Understand these instructions.  Will watch your condition.  Will get help right away if you are not doing well or get worse. Document Released: 06/24/2005 Document Revised: 06/29/2013 Document Reviewed: 03/01/2013 [facility] Patient Information 2015 [place], [place]. This information is not intended to replace advice given to you by your health care provider. Make sure you discuss any questions you have with your health care provider.  Arthralgia Your caregiver has diagnosed you as suffering from an arthralgia. Arthralgia means there is pain in a joint. This can come from many reasons including:  Bruising  the joint which causes soreness (inflammation) in the joint.  Wear and tear on the joints which occur as we grow older (osteoarthritis).  Overusing the joint.  Various forms of arthritis.  Infections of the joint. Regardless of the cause of pain in your joint, most of these different pains respond to anti-inflammatory drugs and rest. The exception to this is when a joint is infected, and these cases are treated with antibiotics, if it is a bacterial infection. HOME CARE INSTRUCTIONS   Rest the injured area for as long as directed by your caregiver. Then slowly start using the joint as directed by your caregiver and as the pain allows. Crutches as directed may be useful if the ankles, knees or hips are involved. If the knee was splinted or casted, continue use and care as directed. If an stretchy or elastic wrapping bandage has been applied today, it should be removed and re-applied every 3 to 4 hours. It should not be applied tightly, but firmly enough to keep swelling down. Watch toes and feet for swelling, bluish discoloration, coldness, numbness or excessive pain. If any of these problems (symptoms) occur,  remove the ace bandage and re-apply more loosely. If these symptoms persist, contact your caregiver or return to this location.  For the first 24 hours, keep the injured extremity elevated on pillows while lying down.  Apply ice for 15-20 minutes to the sore joint every couple hours while awake for the first half day. Then 03-04 times per day for the first 48 hours. Put the ice in a plastic bag and place a towel between the bag of ice and your skin.  Wear any splinting, casting, elastic bandage applications, or slings as instructed.  Only take over-the-counter or prescription medicines for pain, discomfort, or fever as directed by your caregiver. Do not use aspirin immediately after the injury unless instructed by your physician. Aspirin can cause increased bleeding and bruising of the tissues.  If you were given crutches, continue to use them as instructed and do not resume weight bearing on the sore joint until instructed. Persistent pain and inability to use the sore joint as directed for more than 2 to 3 days are warning signs indicating that you should see a caregiver for a follow-up visit as soon as possible. Initially, a hairline fracture (break in bone) may not be evident on X-rays. Persistent pain and swelling indicate that further evaluation, non-weight bearing or use of the joint (use of crutches or slings as instructed), or further X-rays are indicated. X-rays may sometimes not show a small fracture until a week or 10 days later. Make a follow-up appointment with your own caregiver or one to whom we have referred you. A radiologist (specialist in reading X-rays) may read your X-rays. Make sure you know how you are to obtain your X-ray results. Do not assume everything is normal if you do not hear from [place]. SEEK MEDICAL CARE IF: Bruising, swelling, or pain increases. SEEK IMMEDIATE MEDICAL CARE IF:   Your fingers or toes are numb or blue.  The pain is not responding to medications and  continues to stay the same or get worse.  The pain in your joint becomes severe.  You develop a fever over 102 F (38.9 C).  It becomes impossible to move or use the joint. MAKE SURE YOU:   Understand these instructions.  Will watch your condition.  Will get help right away if you are not doing well or get worse. Document Released: 06/24/2005  Document Revised: 09/16/2011 Document Reviewed: 02/10/2008 [facility] Patient Information 2015 [place], [place]. This information is not intended to replace advice given to you by your health care provider. Make sure you discuss any questions you have with your health care provider.

## 2014-12-07 NOTE — ED Notes (Signed)
Pt fell while trying to get out of bed this am. Pt denies any pain.

## 2014-12-07 NOTE — Telephone Encounter (Signed)
Would like return phone call regarding patient being started on antibiotic / tg

## 2014-12-07 NOTE — ED Provider Notes (Signed)
TIME SEEN: 6:00 AM  CHIEF COMPLAINT: Fall  HPI: Pt is a 81 y.o. female with history of coronary artery disease, aortic stenosis who is not a surgical candidate, A. fib on Coumadin, CHF, hyperlipidemia, end-stage renal disease on hemodialysis Monday, Wednesday and Friday he was last dialyzed 2 days ago who presents to the emergency department with a fall out of bed. Reports she was trying to get up out of bed when she slipped out of bed onto the ground. She is not sure if she had her head. Denies any new pain currently. States she has had bilateral foot pain for several weeks. Denies injuring her feet today. Daughter reports she has noticed some small red spots to her feet. No fever. She has a chronic cough. No chest pain or shortness of breath. Patient is wheelchair bound at baseline.  ROS: See HPI Constitutional: no fever  Eyes: no drainage  ENT: no runny nose   Cardiovascular:  no chest pain  Resp: no SOB  GI: no vomiting GU: no dysuria Integumentary: no rash  Allergy: no hives  Musculoskeletal: no leg swelling  Neurological: no slurred speech ROS otherwise negative  PAST MEDICAL HISTORY/PAST SURGICAL HISTORY:  Past Medical History  Diagnosis Date  . Anemia 03/06/2011  . Hypertension   . Coronary artery disease     PCI 2014, 2016  . Aortic stenosis, mild     severe aortic stenosis  . A-fib   . Acute CHF   . Hyperlipidemia   . Myocardial infarction 2014  . Heart murmur   . Pneumonia 10/2014  . Anxiety   . ESRD (end stage renal disease) on dialysis     "MWF; [facility]; [place]" (11/03/2014)    MEDICATIONS:  Prior to Admission medications   Medication Sig Start Date End Date Taking? Authorizing Provider  acetaminophen (TYLENOL) 325 MG tablet Take 2 tablets (650 mg total) by mouth every 4 (four) hours as needed for headache or mild pain. Patient not taking: Reported on 11/20/2014 11/04/14   Abelino Derrick, PA-C  albuterol (PROVENTIL HFA;VENTOLIN HFA) 108 (90 BASE) MCG/ACT inhaler  Inhale 2 puffs into the lungs every 4 (four) hours as needed for wheezing or shortness of breath. 11/20/14   Margarita Grizzle, MD  ALPRAZolam Prudy Feeler) 0.25 MG tablet Take 1 tablet (0.25 mg total) by mouth 2 (two) times daily as needed for anxiety. Patient not taking: Reported on 11/20/2014 10/20/14   Kathleene Hazel, MD  amiodarone (PACERONE) 200 MG tablet Take 1 tablet (200 mg total) by mouth daily. 10/18/14   Kathleene Hazel, MD  atorvastatin (LIPITOR) 40 MG tablet Take 40 mg by mouth daily.      Historical Provider, MD  benzonatate (TESSALON) 100 MG capsule Take 100 mg by mouth 3 (three) times daily as needed for cough.    Historical Provider, MD  cephALEXin (KEFLEX) 500 MG capsule Take 1 capsule (500 mg total) by mouth 2 (two) times daily. 11/15/14   Devoria Albe, MD  chlorpheniramine-HYDROcodone (TUSSIONEX) 10-8 MG/5ML SUER Take 5 mLs by mouth every 12 (twelve) hours as needed for cough. 11/20/14   Margarita Grizzle, MD  clopidogrel (PLAVIX) 75 MG tablet TAKE ONE (1) TABLET EACH DAY 09/01/14   Runell Gess, MD  darbepoetin (ARANESP) 100 MCG/0.5ML SOLN injection Inject 100 mcg into the skin See admin instructions. Takes every Monday, Wednesday and Friday with dialysis    Historical Provider, MD  doxercalciferol (HECTOROL) 4 MCG/2ML injection Inject 3.5 mcg into the vein every Monday, Wednesday, and  Friday with hemodialysis.    Historical Provider, MD  folic acid (FOLVITE) 1 MG tablet Take 1 mg by mouth daily.    Historical Provider, MD  levofloxacin (LEVAQUIN) 500 MG tablet Take 1 tablet (500 mg total) by mouth once. 11/20/14   Margarita Grizzleanielle Ray, MD  metoprolol tartrate (LOPRESSOR) 25 MG tablet Take 1 tablet (25 mg total) by mouth 2 (two) times daily. 11/29/14   Runell GessJonathan J Berry, MD  multivitamin (RENA-VIT) TABS tablet Take 1 tablet by mouth daily.    Historical Provider, MD  nitroGLYCERIN (NITROSTAT) 0.4 MG SL tablet Place 1 tablet (0.4 mg total) under the tongue every 5 (five) minutes as needed for chest  pain. 11/04/14   Abelino DerrickLuke K Kilroy, PA-C  sevelamer carbonate (RENVELA) 800 MG tablet Take 1,600 mg by mouth 2 (two) times daily with a meal.     Historical Provider, MD  sodium bicarbonate 325 MG tablet Take 325 mg by mouth 2 (two) times daily.    Historical Provider, MD  warfarin (COUMADIN) 3 MG tablet Take 0.5-1 tablets (1.5-3 mg total) by mouth daily. Takes 1.5 mg on Sun, Tue, Wed, Thu, and Sat. Takes 3 mg on Mon & Fri.- Patient taking differently: Take 1.5-3 mg by mouth daily. Take 3 mg every day except 1.5 mg on Sat and Sun 11/05/14   Abelino DerrickLuke K Kilroy, PA-C    ALLERGIES:  No Known Allergies  SOCIAL HISTORY:  History  Substance Use Topics  . Smoking status: Never Smoker   . Smokeless tobacco: Never Used  . Alcohol Use: No    FAMILY HISTORY: Family History  Problem Relation Age of Onset  . Heart attack Sister     EXAM: BP 145/56 mmHg  Pulse 81  Temp(Src) 99.3 F (37.4 C) (Oral)  Resp 20  Ht 5\' 3"  (1.6 m)  Wt 114 lb (51.71 kg)  BMI 20.20 kg/m2  SpO2 99% CONSTITUTIONAL: Alert and oriented 3 and responds appropriately to questions. Well-appearing; well-nourished; GCS 15, elderly, no distress HEAD: Normocephalic; atraumatic EYES: Conjunctivae clear, PERRL, EOMI ENT: normal nose; no rhinorrhea; moist mucous membranes; pharynx without lesions noted; no dental injury; no septal hematoma NECK: Supple, no meningismus, no LAD; no midline spinal tenderness, step-off or deformity CARD: RRR; S1 and S2 appreciated; systolic murmur, no clicks, no rubs, no gallops RESP: Normal chest excursion without splinting or tachypnea; breath sounds clear and equal bilaterally; no wheezes, no rhonchi, no rales; no hypoxia or respiratory distress CHEST:  chest wall stable, no crepitus or ecchymosis or deformity, nontender to palpation ABD/GI: Normal bowel sounds; non-distended; soft, non-tender, no rebound, no guarding PELVIS:  stable, nontender to palpation BACK:  The back appears normal and is  non-tender to palpation, there is no CVA tenderness; no midline spinal tenderness, step-off or deformity EXT: Tender to palpation over both of her feet without signs of cellulitis or gallops, no joint effusion, no calf tenderness or swelling, Normal ROM in all joints; otherwise extremities are non-tender to palpation; no edema; normal capillary refill; no cyanosis, no bony tenderness or bony deformity of patient's extremities, no joint effusion, no ecchymosis or lacerations    SKIN: Normal color for age and race; warm NEURO: Moves all extremities equally, sensation to light touch intact diffusely, cranial nerves II through XII intact PSYCH: The patient's mood and manner are appropriate. Grooming and personal hygiene are appropriate.  MEDICAL DECISION MAKING: Patient here with a fall out of bed. Unsure if she hit her head. She is on Coumadin. She has a chronic  cough. Denies chest pain or shortness of breath. Denies fever, vomiting or diarrhea. Has complaints of bilateral foot pain for several weeks. No injury that she can recall to this area. No sign of cellulitis, septic arthritis, gout. She is due for dialysis this morning. We'll obtain CT imaging of her head and cervical spine given possibility of head injury today. We'll obtain chest x-ray to evaluate for any changes given she has a productive cough in the ED. Also obtain x-rays of bilateral feet. No other sign of trauma on exam.  ED PROGRESS: CT of her head and cervical spine show no acute injury. Her chest x-ray shows interval development of atelectasis versus consolidation in the right middle lobe with a small right pleural effusion. X-rays of her feet show no acute injury. Given her questionable right middle lobe pneumonia I will start her on Levaquin. Given she is so well-appearing, afebrile, nontoxic without respiratory distress or hypoxia, at this time I do not feel she needs admission. Family is comfortable with taking her to dialysis and having  close outpatient follow-up with her PCP Dr. Loleta Chance. Patient is currently trying to establish palliative care and is a DO NOT RESUSCITATE/DO NOT INTUBATE.  Patient does have irregularity of the fifth digit of the proximal phalanx of the distal fifth metatarsal and the fifth MTP joint on the right foot. There is no point tenderness over this area. These changes are suspected to be chronic, possibly secondary to prior trauma versus chronic infection. I have low suspicion for chronic infection. They have an appointment to see their podiatrist tomorrow. Advised close outpatient follow-up. Again given patient is trying to establish home services for palliative care, I feel there is little family would do except to make the patient comfortable.  No other injury seen on bilateral foot x-rays. Again no sign of cellulitis, gout on exam.  Discussed return precautions. Patient and daughter at bedside verbalized understanding and are comfortable with this plan.     Layla Maw Marylouise Mallet, DO 12/07/14 [phone]

## 2014-12-07 NOTE — Telephone Encounter (Signed)
Pt was in ED.  Is starting on Levaquin qd x 7 days.  She will hold coumadin on 6/1, 6/3, and 6/5.  She will take 1.5mg  on 6/2 and 6/4.  Will need to decrease strength of coumadin tablet at next office visit.  Daughter verbalized understanding.

## 2014-12-08 DIAGNOSIS — L110 Acquired keratosis follicularis: Secondary | ICD-10-CM | POA: Diagnosis not present

## 2014-12-08 DIAGNOSIS — I120 Hypertensive chronic kidney disease with stage 5 chronic kidney disease or end stage renal disease: Secondary | ICD-10-CM | POA: Diagnosis not present

## 2014-12-08 DIAGNOSIS — J180 Bronchopneumonia, unspecified organism: Secondary | ICD-10-CM | POA: Diagnosis not present

## 2014-12-08 DIAGNOSIS — I739 Peripheral vascular disease, unspecified: Secondary | ICD-10-CM | POA: Diagnosis not present

## 2014-12-08 DIAGNOSIS — B351 Tinea unguium: Secondary | ICD-10-CM | POA: Diagnosis not present

## 2014-12-08 DIAGNOSIS — I5032 Chronic diastolic (congestive) heart failure: Secondary | ICD-10-CM | POA: Diagnosis not present

## 2014-12-12 ENCOUNTER — Ambulatory Visit (INDEPENDENT_AMBULATORY_CARE_PROVIDER_SITE_OTHER): Payer: Medicare Other | Admitting: *Deleted

## 2014-12-12 DIAGNOSIS — I480 Paroxysmal atrial fibrillation: Secondary | ICD-10-CM

## 2014-12-12 DIAGNOSIS — Z7901 Long term (current) use of anticoagulants: Secondary | ICD-10-CM

## 2014-12-12 DIAGNOSIS — I4891 Unspecified atrial fibrillation: Secondary | ICD-10-CM | POA: Diagnosis not present

## 2014-12-12 DIAGNOSIS — Z5181 Encounter for therapeutic drug level monitoring: Secondary | ICD-10-CM

## 2014-12-12 LAB — POCT INR: INR: 7.1

## 2014-12-14 ENCOUNTER — Ambulatory Visit (INDEPENDENT_AMBULATORY_CARE_PROVIDER_SITE_OTHER): Payer: Medicare Other | Admitting: *Deleted

## 2014-12-14 DIAGNOSIS — I480 Paroxysmal atrial fibrillation: Secondary | ICD-10-CM

## 2014-12-14 DIAGNOSIS — Z7901 Long term (current) use of anticoagulants: Secondary | ICD-10-CM

## 2014-12-14 DIAGNOSIS — I4891 Unspecified atrial fibrillation: Secondary | ICD-10-CM | POA: Diagnosis not present

## 2014-12-14 DIAGNOSIS — Z5181 Encounter for therapeutic drug level monitoring: Secondary | ICD-10-CM

## 2014-12-14 LAB — POCT INR: INR: 4

## 2014-12-14 MED ORDER — WARFARIN SODIUM 1 MG PO TABS: ORAL_TABLET | ORAL | Status: DC

## 2014-12-15 DIAGNOSIS — I120 Hypertensive chronic kidney disease with stage 5 chronic kidney disease or end stage renal disease: Secondary | ICD-10-CM | POA: Diagnosis not present

## 2014-12-15 DIAGNOSIS — N186 End stage renal disease: Secondary | ICD-10-CM | POA: Diagnosis not present

## 2014-12-15 DIAGNOSIS — J180 Bronchopneumonia, unspecified organism: Secondary | ICD-10-CM | POA: Diagnosis not present

## 2014-12-15 DIAGNOSIS — I5032 Chronic diastolic (congestive) heart failure: Secondary | ICD-10-CM | POA: Diagnosis not present

## 2014-12-16 DIAGNOSIS — N186 End stage renal disease: Secondary | ICD-10-CM | POA: Diagnosis not present

## 2014-12-17 DIAGNOSIS — N186 End stage renal disease: Secondary | ICD-10-CM | POA: Diagnosis not present

## 2014-12-18 DIAGNOSIS — N186 End stage renal disease: Secondary | ICD-10-CM | POA: Diagnosis not present

## 2014-12-19 DIAGNOSIS — Z992 Dependence on renal dialysis: Secondary | ICD-10-CM | POA: Diagnosis not present

## 2014-12-19 DIAGNOSIS — N186 End stage renal disease: Secondary | ICD-10-CM | POA: Diagnosis not present

## 2014-12-20 DIAGNOSIS — N186 End stage renal disease: Secondary | ICD-10-CM | POA: Diagnosis not present

## 2014-12-21 ENCOUNTER — Ambulatory Visit (INDEPENDENT_AMBULATORY_CARE_PROVIDER_SITE_OTHER): Payer: Medicare Other | Admitting: *Deleted

## 2014-12-21 DIAGNOSIS — Z7901 Long term (current) use of anticoagulants: Secondary | ICD-10-CM | POA: Diagnosis not present

## 2014-12-21 DIAGNOSIS — I480 Paroxysmal atrial fibrillation: Secondary | ICD-10-CM | POA: Diagnosis not present

## 2014-12-21 DIAGNOSIS — I4891 Unspecified atrial fibrillation: Secondary | ICD-10-CM

## 2014-12-21 DIAGNOSIS — N186 End stage renal disease: Secondary | ICD-10-CM | POA: Diagnosis not present

## 2014-12-21 DIAGNOSIS — Z5181 Encounter for therapeutic drug level monitoring: Secondary | ICD-10-CM | POA: Diagnosis not present

## 2014-12-21 LAB — POCT INR: INR: 2.1

## 2014-12-22 DIAGNOSIS — N186 End stage renal disease: Secondary | ICD-10-CM | POA: Diagnosis not present

## 2014-12-23 DIAGNOSIS — N186 End stage renal disease: Secondary | ICD-10-CM | POA: Diagnosis not present

## 2014-12-24 DIAGNOSIS — N186 End stage renal disease: Secondary | ICD-10-CM | POA: Diagnosis not present

## 2014-12-25 DIAGNOSIS — N186 End stage renal disease: Secondary | ICD-10-CM | POA: Diagnosis not present

## 2014-12-27 ENCOUNTER — Ambulatory Visit: Payer: Medicare Other | Admitting: Cardiovascular Disease

## 2015-01-04 ENCOUNTER — Ambulatory Visit (INDEPENDENT_AMBULATORY_CARE_PROVIDER_SITE_OTHER): Payer: Medicare Other | Admitting: *Deleted

## 2015-01-04 DIAGNOSIS — I480 Paroxysmal atrial fibrillation: Secondary | ICD-10-CM

## 2015-01-04 DIAGNOSIS — I4891 Unspecified atrial fibrillation: Secondary | ICD-10-CM

## 2015-01-04 DIAGNOSIS — Z5181 Encounter for therapeutic drug level monitoring: Secondary | ICD-10-CM | POA: Diagnosis not present

## 2015-01-04 DIAGNOSIS — Z7901 Long term (current) use of anticoagulants: Secondary | ICD-10-CM | POA: Diagnosis not present

## 2015-01-04 LAB — POCT INR: INR: 2.2

## 2015-01-05 DIAGNOSIS — Z992 Dependence on renal dialysis: Secondary | ICD-10-CM | POA: Diagnosis not present

## 2015-01-05 DIAGNOSIS — N186 End stage renal disease: Secondary | ICD-10-CM | POA: Diagnosis not present

## 2015-01-06 DIAGNOSIS — D631 Anemia in chronic kidney disease: Secondary | ICD-10-CM | POA: Diagnosis not present

## 2015-01-06 DIAGNOSIS — N2581 Secondary hyperparathyroidism of renal origin: Secondary | ICD-10-CM | POA: Diagnosis not present

## 2015-01-06 DIAGNOSIS — R1110 Vomiting, unspecified: Secondary | ICD-10-CM | POA: Diagnosis not present

## 2015-01-06 DIAGNOSIS — N186 End stage renal disease: Secondary | ICD-10-CM | POA: Diagnosis not present

## 2015-01-06 DIAGNOSIS — Z992 Dependence on renal dialysis: Secondary | ICD-10-CM | POA: Diagnosis not present

## 2015-01-09 DIAGNOSIS — D631 Anemia in chronic kidney disease: Secondary | ICD-10-CM | POA: Diagnosis not present

## 2015-01-09 DIAGNOSIS — N186 End stage renal disease: Secondary | ICD-10-CM | POA: Diagnosis not present

## 2015-01-09 DIAGNOSIS — Z992 Dependence on renal dialysis: Secondary | ICD-10-CM | POA: Diagnosis not present

## 2015-01-09 DIAGNOSIS — R1110 Vomiting, unspecified: Secondary | ICD-10-CM | POA: Diagnosis not present

## 2015-01-09 DIAGNOSIS — N2581 Secondary hyperparathyroidism of renal origin: Secondary | ICD-10-CM | POA: Diagnosis not present

## 2015-01-11 DIAGNOSIS — N2581 Secondary hyperparathyroidism of renal origin: Secondary | ICD-10-CM | POA: Diagnosis not present

## 2015-01-11 DIAGNOSIS — R1110 Vomiting, unspecified: Secondary | ICD-10-CM | POA: Diagnosis not present

## 2015-01-11 DIAGNOSIS — N186 End stage renal disease: Secondary | ICD-10-CM | POA: Diagnosis not present

## 2015-01-11 DIAGNOSIS — D631 Anemia in chronic kidney disease: Secondary | ICD-10-CM | POA: Diagnosis not present

## 2015-01-11 DIAGNOSIS — Z992 Dependence on renal dialysis: Secondary | ICD-10-CM | POA: Diagnosis not present

## 2015-01-13 DIAGNOSIS — Z992 Dependence on renal dialysis: Secondary | ICD-10-CM | POA: Diagnosis not present

## 2015-01-13 DIAGNOSIS — N186 End stage renal disease: Secondary | ICD-10-CM | POA: Diagnosis not present

## 2015-01-13 DIAGNOSIS — N2581 Secondary hyperparathyroidism of renal origin: Secondary | ICD-10-CM | POA: Diagnosis not present

## 2015-01-13 DIAGNOSIS — D631 Anemia in chronic kidney disease: Secondary | ICD-10-CM | POA: Diagnosis not present

## 2015-01-13 DIAGNOSIS — R1110 Vomiting, unspecified: Secondary | ICD-10-CM | POA: Diagnosis not present

## 2015-01-16 DIAGNOSIS — N186 End stage renal disease: Secondary | ICD-10-CM | POA: Diagnosis not present

## 2015-01-16 DIAGNOSIS — R1110 Vomiting, unspecified: Secondary | ICD-10-CM | POA: Diagnosis not present

## 2015-01-16 DIAGNOSIS — D631 Anemia in chronic kidney disease: Secondary | ICD-10-CM | POA: Diagnosis not present

## 2015-01-16 DIAGNOSIS — N2581 Secondary hyperparathyroidism of renal origin: Secondary | ICD-10-CM | POA: Diagnosis not present

## 2015-01-16 DIAGNOSIS — Z992 Dependence on renal dialysis: Secondary | ICD-10-CM | POA: Diagnosis not present

## 2015-01-18 DIAGNOSIS — N186 End stage renal disease: Secondary | ICD-10-CM | POA: Diagnosis not present

## 2015-01-18 DIAGNOSIS — N2581 Secondary hyperparathyroidism of renal origin: Secondary | ICD-10-CM | POA: Diagnosis not present

## 2015-01-18 DIAGNOSIS — Z992 Dependence on renal dialysis: Secondary | ICD-10-CM | POA: Diagnosis not present

## 2015-01-18 DIAGNOSIS — D631 Anemia in chronic kidney disease: Secondary | ICD-10-CM | POA: Diagnosis not present

## 2015-01-18 DIAGNOSIS — R1110 Vomiting, unspecified: Secondary | ICD-10-CM | POA: Diagnosis not present

## 2015-01-20 DIAGNOSIS — D631 Anemia in chronic kidney disease: Secondary | ICD-10-CM | POA: Diagnosis not present

## 2015-01-20 DIAGNOSIS — Z992 Dependence on renal dialysis: Secondary | ICD-10-CM | POA: Diagnosis not present

## 2015-01-20 DIAGNOSIS — N2581 Secondary hyperparathyroidism of renal origin: Secondary | ICD-10-CM | POA: Diagnosis not present

## 2015-01-20 DIAGNOSIS — N186 End stage renal disease: Secondary | ICD-10-CM | POA: Diagnosis not present

## 2015-01-20 DIAGNOSIS — R1110 Vomiting, unspecified: Secondary | ICD-10-CM | POA: Diagnosis not present

## 2015-01-23 DIAGNOSIS — N186 End stage renal disease: Secondary | ICD-10-CM | POA: Diagnosis not present

## 2015-01-23 DIAGNOSIS — N2581 Secondary hyperparathyroidism of renal origin: Secondary | ICD-10-CM | POA: Diagnosis not present

## 2015-01-23 DIAGNOSIS — Z992 Dependence on renal dialysis: Secondary | ICD-10-CM | POA: Diagnosis not present

## 2015-01-23 DIAGNOSIS — R1110 Vomiting, unspecified: Secondary | ICD-10-CM | POA: Diagnosis not present

## 2015-01-23 DIAGNOSIS — D631 Anemia in chronic kidney disease: Secondary | ICD-10-CM | POA: Diagnosis not present

## 2015-01-25 DIAGNOSIS — R1110 Vomiting, unspecified: Secondary | ICD-10-CM | POA: Diagnosis not present

## 2015-01-25 DIAGNOSIS — N186 End stage renal disease: Secondary | ICD-10-CM | POA: Diagnosis not present

## 2015-01-25 DIAGNOSIS — Z992 Dependence on renal dialysis: Secondary | ICD-10-CM | POA: Diagnosis not present

## 2015-01-25 DIAGNOSIS — D631 Anemia in chronic kidney disease: Secondary | ICD-10-CM | POA: Diagnosis not present

## 2015-01-25 DIAGNOSIS — N2581 Secondary hyperparathyroidism of renal origin: Secondary | ICD-10-CM | POA: Diagnosis not present

## 2015-01-27 DIAGNOSIS — N186 End stage renal disease: Secondary | ICD-10-CM | POA: Diagnosis not present

## 2015-01-27 DIAGNOSIS — N2581 Secondary hyperparathyroidism of renal origin: Secondary | ICD-10-CM | POA: Diagnosis not present

## 2015-01-27 DIAGNOSIS — Z992 Dependence on renal dialysis: Secondary | ICD-10-CM | POA: Diagnosis not present

## 2015-01-27 DIAGNOSIS — R1110 Vomiting, unspecified: Secondary | ICD-10-CM | POA: Diagnosis not present

## 2015-01-27 DIAGNOSIS — D631 Anemia in chronic kidney disease: Secondary | ICD-10-CM | POA: Diagnosis not present

## 2015-01-30 DIAGNOSIS — D631 Anemia in chronic kidney disease: Secondary | ICD-10-CM | POA: Diagnosis not present

## 2015-01-30 DIAGNOSIS — N186 End stage renal disease: Secondary | ICD-10-CM | POA: Diagnosis not present

## 2015-01-30 DIAGNOSIS — Z992 Dependence on renal dialysis: Secondary | ICD-10-CM | POA: Diagnosis not present

## 2015-01-30 DIAGNOSIS — R1110 Vomiting, unspecified: Secondary | ICD-10-CM | POA: Diagnosis not present

## 2015-01-30 DIAGNOSIS — N2581 Secondary hyperparathyroidism of renal origin: Secondary | ICD-10-CM | POA: Diagnosis not present

## 2015-01-31 ENCOUNTER — Encounter: Payer: Self-pay | Admitting: Cardiovascular Disease

## 2015-02-01 DIAGNOSIS — Z992 Dependence on renal dialysis: Secondary | ICD-10-CM | POA: Diagnosis not present

## 2015-02-01 DIAGNOSIS — D631 Anemia in chronic kidney disease: Secondary | ICD-10-CM | POA: Diagnosis not present

## 2015-02-01 DIAGNOSIS — R1110 Vomiting, unspecified: Secondary | ICD-10-CM | POA: Diagnosis not present

## 2015-02-01 DIAGNOSIS — N2581 Secondary hyperparathyroidism of renal origin: Secondary | ICD-10-CM | POA: Diagnosis not present

## 2015-02-01 DIAGNOSIS — N186 End stage renal disease: Secondary | ICD-10-CM | POA: Diagnosis not present

## 2015-02-03 DIAGNOSIS — Z992 Dependence on renal dialysis: Secondary | ICD-10-CM | POA: Diagnosis not present

## 2015-02-03 DIAGNOSIS — N186 End stage renal disease: Secondary | ICD-10-CM | POA: Diagnosis not present

## 2015-02-03 DIAGNOSIS — R1110 Vomiting, unspecified: Secondary | ICD-10-CM | POA: Diagnosis not present

## 2015-02-03 DIAGNOSIS — D631 Anemia in chronic kidney disease: Secondary | ICD-10-CM | POA: Diagnosis not present

## 2015-02-03 DIAGNOSIS — N2581 Secondary hyperparathyroidism of renal origin: Secondary | ICD-10-CM | POA: Diagnosis not present

## 2015-02-05 DIAGNOSIS — N186 End stage renal disease: Secondary | ICD-10-CM | POA: Diagnosis not present

## 2015-02-05 DIAGNOSIS — Z992 Dependence on renal dialysis: Secondary | ICD-10-CM | POA: Diagnosis not present

## 2015-02-06 ENCOUNTER — Ambulatory Visit (INDEPENDENT_AMBULATORY_CARE_PROVIDER_SITE_OTHER): Payer: Medicare Other | Admitting: *Deleted

## 2015-02-06 DIAGNOSIS — Z5181 Encounter for therapeutic drug level monitoring: Secondary | ICD-10-CM

## 2015-02-06 DIAGNOSIS — Z7901 Long term (current) use of anticoagulants: Secondary | ICD-10-CM | POA: Diagnosis not present

## 2015-02-06 DIAGNOSIS — I4891 Unspecified atrial fibrillation: Secondary | ICD-10-CM | POA: Diagnosis not present

## 2015-02-06 DIAGNOSIS — I480 Paroxysmal atrial fibrillation: Secondary | ICD-10-CM | POA: Diagnosis not present

## 2015-02-06 LAB — POCT INR: INR: 1.9

## 2015-02-16 ENCOUNTER — Encounter (HOSPITAL_COMMUNITY): Payer: Self-pay | Admitting: *Deleted

## 2015-02-23 DIAGNOSIS — B351 Tinea unguium: Secondary | ICD-10-CM | POA: Diagnosis not present

## 2015-02-23 DIAGNOSIS — I739 Peripheral vascular disease, unspecified: Secondary | ICD-10-CM | POA: Diagnosis not present

## 2015-02-23 DIAGNOSIS — L110 Acquired keratosis follicularis: Secondary | ICD-10-CM | POA: Diagnosis not present

## 2015-02-27 ENCOUNTER — Ambulatory Visit (INDEPENDENT_AMBULATORY_CARE_PROVIDER_SITE_OTHER): Payer: Medicare Other | Admitting: *Deleted

## 2015-02-27 DIAGNOSIS — Z5181 Encounter for therapeutic drug level monitoring: Secondary | ICD-10-CM

## 2015-02-27 DIAGNOSIS — I4891 Unspecified atrial fibrillation: Secondary | ICD-10-CM

## 2015-02-27 DIAGNOSIS — Z7901 Long term (current) use of anticoagulants: Secondary | ICD-10-CM | POA: Diagnosis not present

## 2015-02-27 DIAGNOSIS — I480 Paroxysmal atrial fibrillation: Secondary | ICD-10-CM | POA: Diagnosis not present

## 2015-02-27 LAB — POCT INR: INR: 1.3

## 2015-03-02 ENCOUNTER — Other Ambulatory Visit: Payer: Self-pay | Admitting: Cardiovascular Disease

## 2015-03-17 ENCOUNTER — Ambulatory Visit (INDEPENDENT_AMBULATORY_CARE_PROVIDER_SITE_OTHER): Payer: Medicare Other | Admitting: *Deleted

## 2015-03-17 DIAGNOSIS — Z7901 Long term (current) use of anticoagulants: Secondary | ICD-10-CM

## 2015-03-17 DIAGNOSIS — I4891 Unspecified atrial fibrillation: Secondary | ICD-10-CM | POA: Diagnosis not present

## 2015-03-17 DIAGNOSIS — I480 Paroxysmal atrial fibrillation: Secondary | ICD-10-CM

## 2015-03-17 DIAGNOSIS — Z5181 Encounter for therapeutic drug level monitoring: Secondary | ICD-10-CM | POA: Diagnosis not present

## 2015-03-17 LAB — POCT INR: INR: 1.4

## 2015-03-20 ENCOUNTER — Telehealth: Payer: Self-pay | Admitting: *Deleted

## 2015-03-20 MED ORDER — WARFARIN SODIUM 1 MG PO TABS: ORAL_TABLET | ORAL | Status: DC

## 2015-03-20 NOTE — Telephone Encounter (Signed)
Needs refill on Warfarin sent to [facility] with new directions. / tg

## 2015-03-27 ENCOUNTER — Encounter

## 2015-04-03 ENCOUNTER — Ambulatory Visit (INDEPENDENT_AMBULATORY_CARE_PROVIDER_SITE_OTHER): Payer: Medicare Other | Admitting: *Deleted

## 2015-04-03 DIAGNOSIS — I480 Paroxysmal atrial fibrillation: Secondary | ICD-10-CM | POA: Diagnosis not present

## 2015-04-03 DIAGNOSIS — Z5181 Encounter for therapeutic drug level monitoring: Secondary | ICD-10-CM | POA: Diagnosis not present

## 2015-04-03 DIAGNOSIS — Z7901 Long term (current) use of anticoagulants: Secondary | ICD-10-CM

## 2015-04-03 DIAGNOSIS — I4891 Unspecified atrial fibrillation: Secondary | ICD-10-CM | POA: Diagnosis not present

## 2015-04-03 LAB — POCT INR: INR: 1.5

## 2015-04-03 MED ORDER — WARFARIN SODIUM 1 MG PO TABS: ORAL_TABLET | ORAL | Status: DC

## 2015-04-12 ENCOUNTER — Encounter

## 2015-04-17 ENCOUNTER — Ambulatory Visit (INDEPENDENT_AMBULATORY_CARE_PROVIDER_SITE_OTHER): Payer: Medicare Other | Admitting: *Deleted

## 2015-04-17 DIAGNOSIS — I4891 Unspecified atrial fibrillation: Secondary | ICD-10-CM

## 2015-04-17 DIAGNOSIS — Z5181 Encounter for therapeutic drug level monitoring: Secondary | ICD-10-CM | POA: Diagnosis not present

## 2015-04-17 DIAGNOSIS — I480 Paroxysmal atrial fibrillation: Secondary | ICD-10-CM

## 2015-04-17 DIAGNOSIS — Z7901 Long term (current) use of anticoagulants: Secondary | ICD-10-CM

## 2015-04-17 LAB — POCT INR: INR: 1.9

## 2015-04-26 ENCOUNTER — Other Ambulatory Visit: Payer: Self-pay | Admitting: Cardiovascular Disease

## 2015-05-08 ENCOUNTER — Ambulatory Visit (INDEPENDENT_AMBULATORY_CARE_PROVIDER_SITE_OTHER): Payer: Medicare Other | Admitting: *Deleted

## 2015-05-08 DIAGNOSIS — Z5181 Encounter for therapeutic drug level monitoring: Secondary | ICD-10-CM

## 2015-05-08 DIAGNOSIS — Z7901 Long term (current) use of anticoagulants: Secondary | ICD-10-CM

## 2015-05-08 DIAGNOSIS — I4891 Unspecified atrial fibrillation: Secondary | ICD-10-CM

## 2015-05-08 DIAGNOSIS — I480 Paroxysmal atrial fibrillation: Secondary | ICD-10-CM

## 2015-05-08 LAB — POCT INR: INR: 2.5

## 2015-05-08 MED ORDER — WARFARIN SODIUM 1 MG PO TABS: ORAL_TABLET | ORAL | Status: DC

## 2015-05-11 DIAGNOSIS — B351 Tinea unguium: Secondary | ICD-10-CM | POA: Diagnosis not present

## 2015-05-11 DIAGNOSIS — I739 Peripheral vascular disease, unspecified: Secondary | ICD-10-CM | POA: Diagnosis not present

## 2015-05-11 DIAGNOSIS — L110 Acquired keratosis follicularis: Secondary | ICD-10-CM | POA: Diagnosis not present

## 2015-06-05 ENCOUNTER — Ambulatory Visit (INDEPENDENT_AMBULATORY_CARE_PROVIDER_SITE_OTHER): Payer: Medicare Other | Admitting: *Deleted

## 2015-06-05 DIAGNOSIS — I480 Paroxysmal atrial fibrillation: Secondary | ICD-10-CM | POA: Diagnosis not present

## 2015-06-05 DIAGNOSIS — Z5181 Encounter for therapeutic drug level monitoring: Secondary | ICD-10-CM

## 2015-06-05 DIAGNOSIS — I4891 Unspecified atrial fibrillation: Secondary | ICD-10-CM

## 2015-06-05 DIAGNOSIS — Z7901 Long term (current) use of anticoagulants: Secondary | ICD-10-CM | POA: Diagnosis not present

## 2015-06-05 LAB — POCT INR: INR: 2.2

## 2015-07-05 ENCOUNTER — Ambulatory Visit (INDEPENDENT_AMBULATORY_CARE_PROVIDER_SITE_OTHER): Payer: Medicare Other | Admitting: *Deleted

## 2015-07-05 DIAGNOSIS — Z7901 Long term (current) use of anticoagulants: Secondary | ICD-10-CM

## 2015-07-05 DIAGNOSIS — Z5181 Encounter for therapeutic drug level monitoring: Secondary | ICD-10-CM | POA: Diagnosis not present

## 2015-07-05 DIAGNOSIS — I4891 Unspecified atrial fibrillation: Secondary | ICD-10-CM | POA: Diagnosis not present

## 2015-07-05 DIAGNOSIS — I480 Paroxysmal atrial fibrillation: Secondary | ICD-10-CM | POA: Diagnosis not present

## 2015-07-05 LAB — POCT INR: INR: 1.6

## 2015-07-19 ENCOUNTER — Ambulatory Visit (INDEPENDENT_AMBULATORY_CARE_PROVIDER_SITE_OTHER): Payer: Medicare Other | Admitting: *Deleted

## 2015-07-19 ENCOUNTER — Encounter

## 2015-07-19 DIAGNOSIS — Z5181 Encounter for therapeutic drug level monitoring: Secondary | ICD-10-CM

## 2015-07-19 DIAGNOSIS — I480 Paroxysmal atrial fibrillation: Secondary | ICD-10-CM

## 2015-07-19 DIAGNOSIS — I4891 Unspecified atrial fibrillation: Secondary | ICD-10-CM | POA: Diagnosis not present

## 2015-07-19 DIAGNOSIS — N186 End stage renal disease: Secondary | ICD-10-CM | POA: Diagnosis not present

## 2015-07-19 DIAGNOSIS — Z7901 Long term (current) use of anticoagulants: Secondary | ICD-10-CM | POA: Diagnosis not present

## 2015-07-19 LAB — POCT INR: INR: 1.4

## 2015-07-19 MED ORDER — WARFARIN SODIUM 1 MG PO TABS: ORAL_TABLET | ORAL | Status: DC

## 2015-07-31 ENCOUNTER — Emergency Department (HOSPITAL_COMMUNITY): Payer: Medicare Other

## 2015-07-31 ENCOUNTER — Observation Stay (HOSPITAL_COMMUNITY)
Admission: EM | Admit: 2015-07-31 | Discharge: 2015-08-02 | Disposition: A | Discharge status: Home / Self Care | Payer: Medicare Other | Attending: Internal Medicine | Admitting: Internal Medicine

## 2015-07-31 ENCOUNTER — Encounter (HOSPITAL_COMMUNITY): Payer: Self-pay | Admitting: Emergency Medicine

## 2015-07-31 DIAGNOSIS — I10 Essential (primary) hypertension: Secondary | ICD-10-CM | POA: Diagnosis not present

## 2015-07-31 DIAGNOSIS — F419 Anxiety disorder, unspecified: Secondary | ICD-10-CM | POA: Diagnosis not present

## 2015-07-31 DIAGNOSIS — R011 Cardiac murmur, unspecified: Secondary | ICD-10-CM | POA: Insufficient documentation

## 2015-07-31 DIAGNOSIS — I480 Paroxysmal atrial fibrillation: Secondary | ICD-10-CM | POA: Diagnosis present

## 2015-07-31 DIAGNOSIS — I517 Cardiomegaly: Secondary | ICD-10-CM | POA: Diagnosis not present

## 2015-07-31 DIAGNOSIS — I4891 Unspecified atrial fibrillation: Secondary | ICD-10-CM | POA: Diagnosis not present

## 2015-07-31 DIAGNOSIS — I120 Hypertensive chronic kidney disease with stage 5 chronic kidney disease or end stage renal disease: Secondary | ICD-10-CM | POA: Diagnosis not present

## 2015-07-31 DIAGNOSIS — I350 Nonrheumatic aortic (valve) stenosis: Secondary | ICD-10-CM | POA: Diagnosis not present

## 2015-07-31 DIAGNOSIS — R0789 Other chest pain: Secondary | ICD-10-CM

## 2015-07-31 DIAGNOSIS — Z7901 Long term (current) use of anticoagulants: Secondary | ICD-10-CM | POA: Insufficient documentation

## 2015-07-31 DIAGNOSIS — I2510 Atherosclerotic heart disease of native coronary artery without angina pectoris: Secondary | ICD-10-CM | POA: Diagnosis not present

## 2015-07-31 DIAGNOSIS — I509 Heart failure, unspecified: Secondary | ICD-10-CM | POA: Diagnosis not present

## 2015-07-31 DIAGNOSIS — D649 Anemia, unspecified: Secondary | ICD-10-CM | POA: Diagnosis not present

## 2015-07-31 DIAGNOSIS — R079 Chest pain, unspecified: Secondary | ICD-10-CM | POA: Diagnosis not present

## 2015-07-31 DIAGNOSIS — Z8701 Personal history of pneumonia (recurrent): Secondary | ICD-10-CM | POA: Diagnosis not present

## 2015-07-31 DIAGNOSIS — E876 Hypokalemia: Secondary | ICD-10-CM

## 2015-07-31 DIAGNOSIS — R6889 Other general symptoms and signs: Secondary | ICD-10-CM | POA: Diagnosis not present

## 2015-07-31 DIAGNOSIS — E785 Hyperlipidemia, unspecified: Secondary | ICD-10-CM | POA: Diagnosis not present

## 2015-07-31 DIAGNOSIS — Z9889 Other specified postprocedural states: Secondary | ICD-10-CM | POA: Insufficient documentation

## 2015-07-31 DIAGNOSIS — N186 End stage renal disease: Secondary | ICD-10-CM | POA: Insufficient documentation

## 2015-07-31 DIAGNOSIS — M545 Low back pain: Secondary | ICD-10-CM | POA: Diagnosis not present

## 2015-07-31 DIAGNOSIS — I252 Old myocardial infarction: Secondary | ICD-10-CM | POA: Diagnosis not present

## 2015-07-31 DIAGNOSIS — Z992 Dependence on renal dialysis: Secondary | ICD-10-CM | POA: Insufficient documentation

## 2015-07-31 LAB — CBC
HCT: 33.8 % — ABNORMAL LOW (ref 36.0–46.0)
Hemoglobin: 11 g/dL — ABNORMAL LOW (ref 12.0–15.0)
MCH: 28.9 pg (ref 26.0–34.0)
MCHC: 32.5 g/dL (ref 30.0–36.0)
MCV: 88.9 fL (ref 78.0–100.0)
Platelets: 262 10*3/uL (ref 150–400)
RBC: 3.8 MIL/uL — ABNORMAL LOW (ref 3.87–5.11)
RDW: 18.1 % — ABNORMAL HIGH (ref 11.5–15.5)
WBC: 11.3 10*3/uL — ABNORMAL HIGH (ref 4.0–10.5)

## 2015-07-31 LAB — BASIC METABOLIC PANEL
Anion gap: 14 (ref 5–15)
BUN: 31 mg/dL — ABNORMAL HIGH (ref 6–20)
CO2: 27 mmol/L (ref 22–32)
Calcium: 8.4 mg/dL — ABNORMAL LOW (ref 8.9–10.3)
Chloride: 97 mmol/L — ABNORMAL LOW (ref 101–111)
Creatinine, Ser: 3.28 mg/dL — ABNORMAL HIGH (ref 0.44–1.00)
GFR calc Af Amer: 14 mL/min — ABNORMAL LOW (ref >60)
GFR calc non Af Amer: 12 mL/min — ABNORMAL LOW (ref >60)
Glucose, Bld: 110 mg/dL — ABNORMAL HIGH (ref 65–99)
Potassium: 2.7 mmol/L — CL (ref 3.5–5.1)
Sodium: 138 mmol/L (ref 135–145)

## 2015-07-31 LAB — PROTIME-INR
INR: 3.34 — ABNORMAL HIGH (ref 0.00–1.49)
Prothrombin Time: 33.2 seconds — ABNORMAL HIGH (ref 11.6–15.2)

## 2015-07-31 LAB — TROPONIN I
Troponin I: 0.05 ng/mL — ABNORMAL HIGH (ref <0.031)
Troponin I: 0.15 ng/mL — ABNORMAL HIGH (ref <0.031)
Troponin I: 0.28 ng/mL — ABNORMAL HIGH (ref <0.031)

## 2015-07-31 LAB — BRAIN NATRIURETIC PEPTIDE: B Natriuretic Peptide: >4500 pg/mL — ABNORMAL HIGH (ref 0.0–100.0)

## 2015-07-31 MED ORDER — NITROGLYCERIN 0.4 MG SL SUBL: 0.4000 mg | SUBLINGUAL_TABLET | SUBLINGUAL | Status: DC | PRN

## 2015-07-31 MED ORDER — HYDROCOD POLST-CPM POLST ER 10-8 MG/5ML PO SUER
5.0000 mL | Freq: Two times a day (BID) | ORAL | Status: DC | PRN
  Administered 2015-08-01: 5 mL via ORAL
  Filled 2015-07-31: qty 5

## 2015-07-31 MED ORDER — GUAIFENESIN ER 600 MG PO TB12
1200.0000 mg | ORAL_TABLET | Freq: Two times a day (BID) | ORAL | Status: DC | PRN
  Administered 2015-08-01: 1200 mg via ORAL
  Filled 2015-07-31: qty 2

## 2015-07-31 MED ORDER — GUAIFENESIN-CODEINE 100-10 MG/5ML PO SOLN
5.0000 mL | Freq: Once | ORAL | Status: AC
  Administered 2015-07-31: 5 mL via ORAL
  Filled 2015-07-31: qty 5

## 2015-07-31 MED ORDER — SODIUM CHLORIDE 0.9 % IJ SOLN
3.0000 mL | Freq: Two times a day (BID) | INTRAMUSCULAR | Status: DC
  Administered 2015-08-01 – 2015-08-02 (×3): 3 mL via INTRAVENOUS

## 2015-07-31 MED ORDER — ALBUTEROL SULFATE (2.5 MG/3ML) 0.083% IN NEBU: 3.0000 mL | INHALATION_SOLUTION | RESPIRATORY_TRACT | Status: DC | PRN

## 2015-07-31 MED ORDER — ACETAMINOPHEN 325 MG PO TABS: 650.0000 mg | ORAL_TABLET | ORAL | Status: DC | PRN

## 2015-07-31 MED ORDER — POTASSIUM CHLORIDE CRYS ER 20 MEQ PO TBCR
20.0000 meq | EXTENDED_RELEASE_TABLET | Freq: Once | ORAL | Status: AC
  Administered 2015-07-31: 20 meq via ORAL
  Filled 2015-07-31: qty 1

## 2015-07-31 MED ORDER — ONDANSETRON HCL 4 MG/2ML IJ SOLN: 4.0000 mg | Freq: Four times a day (QID) | INTRAMUSCULAR | Status: DC | PRN

## 2015-07-31 MED ORDER — SODIUM CHLORIDE 0.9 % IV SOLN: 250.0000 mL | INTRAVENOUS | Status: DC | PRN

## 2015-07-31 MED ORDER — ASPIRIN EC 325 MG PO TBEC
325.0000 mg | DELAYED_RELEASE_TABLET | Freq: Every day | ORAL | Status: DC
  Administered 2015-08-01 – 2015-08-02 (×2): 325 mg via ORAL
  Filled 2015-07-31 (×2): qty 1

## 2015-07-31 MED ORDER — SODIUM CHLORIDE 0.9 % IJ SOLN: 3.0000 mL | INTRAMUSCULAR | Status: DC | PRN

## 2015-07-31 MED ORDER — AMIODARONE HCL 200 MG PO TABS
200.0000 mg | ORAL_TABLET | Freq: Every day | ORAL | Status: DC
Start: 2015-07-31 — End: 2015-08-02
  Administered 2015-07-31 – 2015-08-02 (×3): 200 mg via ORAL
  Filled 2015-07-31 (×3): qty 1

## 2015-07-31 MED ORDER — BENZONATATE 100 MG PO CAPS
100.0000 mg | ORAL_CAPSULE | Freq: Once | ORAL | Status: AC
  Administered 2015-07-31: 100 mg via ORAL
  Filled 2015-07-31: qty 1

## 2015-07-31 MED ORDER — DOXERCALCIFEROL 4 MCG/2ML IV SOLN
3.5000 ug | INTRAVENOUS | Status: DC
  Administered 2015-08-02: 3.5 ug via INTRAVENOUS
  Filled 2015-07-31 (×2): qty 2

## 2015-07-31 MED ORDER — ASPIRIN 81 MG PO CHEW
324.0000 mg | CHEWABLE_TABLET | Freq: Once | ORAL | Status: AC
  Administered 2015-07-31: 324 mg via ORAL
  Filled 2015-07-31: qty 4

## 2015-07-31 NOTE — ED Notes (Signed)
Pt c/o right sided chest pain-radiating throughout right upper back/side since finishing dialysis at 1000 today.

## 2015-07-31 NOTE — H&P (Signed)
PCP:   [name], MD   Chief Complaint:  Right sided pain  HPI: 82 yo female h/o ESRD dialysis, HTN, severe AS, CAD comes in after experiencing an episode during dialysis today when she got very weak and had pain along the right side of her body, she says it was in her chest all the way down just on the right side.  No weakness.  No fevers.  She has been coughing a lot.  No weight gain.  She feels back to normal now.  Her bp sometimes drops during dialysis and she did pass out once last year but usually she tolerates her sessions well.  She is being referred for admission for chest pain with  Mild elevation of her troponin.  Review of Systems:  Positive and negative as per HPI otherwise all other systems are negative  Past Medical History: Past Medical History  Diagnosis Date  . Anemia 03/06/2011  . Hypertension   . Coronary artery disease     PCI 2014, 2016  . Aortic stenosis, mild     severe aortic stenosis  . A-fib (HCC)   . Acute CHF (HCC)   . Hyperlipidemia   . Myocardial infarction (HCC) 2014  . Heart murmur   . Pneumonia 10/2014  . Anxiety   . ESRD (end stage renal disease) on dialysis (HCC)     "MWF; [facility]; [place]" (11/03/2014)   Past Surgical History  Procedure Laterality Date  . Av fistula placement Left ~ 2012    upper arm arm  . Forearm fracture surgery Bilateral     "fell in the basement"  . Fracture surgery    . Pseudoaneurysm repair Right 05/2013    leg  . Cataract extraction w/phaco Right 10/19/2013    Procedure: CATARACT EXTRACTION PHACO AND INTRAOCULAR LENS PLACEMENT (IOC);  Surgeon: Loraine Leriche T. Nile Riggs, MD;  Location: [facility];  Service: Ophthalmology;  Laterality: Right;  CDE:15.66  . Cataract extraction w/phaco Left 11/02/2013    Procedure: CATARACT EXTRACTION PHACO AND INTRAOCULAR LENS PLACEMENT (IOC);  Surgeon: Loraine Leriche T. Nile Riggs, MD;  Location: [facility];  Service: Ophthalmology;  Laterality: Left;  CDE:8.78  . Left and right heart catheterization with  coronary angiogram N/A 05/11/2013    Procedure: LEFT AND RIGHT HEART CATHETERIZATION WITH CORONARY ANGIOGRAM;  Surgeon: Runell Gess, MD;  Location: [facility] CATH LAB;  Service: Cardiovascular;  Laterality: N/A;  . Percutaneous coronary rotoblator intervention (pci-r) N/A 05/14/2013    Procedure: PERCUTANEOUS CORONARY ROTOBLATOR INTERVENTION (PCI-R);  Surgeon: Lennette Bihari, MD;  Location: [facility] CATH LAB;  Service: Cardiovascular;  Laterality: N/A;  . Percutaneous coronary rotoblator intervention (pci-r) N/A 05/19/2013    Procedure: PERCUTANEOUS CORONARY ROTOBLATOR INTERVENTION (PCI-R);  Surgeon: Lennette Bihari, MD;  Location: [facility] CATH LAB;  Service: Cardiovascular;  Laterality: N/A;  . Cardiac catheterization  ; 11/03/2014  . Coronary angioplasty with stent placement    . Cataract extraction w/ intraocular lens  implant, bilateral Bilateral 2015  . Left and right heart catheterization with coronary angiogram N/A 11/03/2014    Procedure: LEFT AND RIGHT HEART CATHETERIZATION WITH CORONARY ANGIOGRAM;  Surgeon: Kathleene Hazel, MD;  Location: [facility] CATH LAB;  Service: Cardiovascular;  Laterality: N/A;    Medications: Prior to Admission medications   Medication Sig Start Date End Date Taking? Authorizing Provider  albuterol (PROVENTIL HFA;VENTOLIN HFA) 108 (90 BASE) MCG/ACT inhaler Inhale 2 puffs into the lungs every 4 (four) hours as needed for wheezing or shortness of breath. 11/20/14  Yes Margarita Grizzle,  MD  amiodarone (PACERONE) 200 MG tablet TAKE ONE TABLET BY MOUTH DAILY. 04/26/15  Yes Kathleene Hazel, MD  chlorpheniramine-HYDROcodone (TUSSIONEX) 10-8 MG/5ML SUER Take 5 mLs by mouth every 12 (twelve) hours as needed for cough. 11/20/14  Yes Margarita Grizzle, MD  darbepoetin (ARANESP) 100 MCG/0.5ML SOLN injection Inject 100 mcg into the skin See admin instructions. Takes every Monday, Wednesday and Friday with dialysis   Yes Historical Provider, MD  doxercalciferol (HECTOROL) 4 MCG/2ML injection  Inject 3.5 mcg into the vein every Monday, Wednesday, and Friday with hemodialysis.   Yes Historical Provider, MD  Guaifenesin (MUCINEX MAXIMUM STRENGTH) 1200 MG TB12 Take 1 tablet by mouth 2 (two) times daily as needed (for congestion/cough).   Yes Historical Provider, MD  nitroGLYCERIN (NITROSTAT) 0.4 MG SL tablet Place 1 tablet (0.4 mg total) under the tongue every 5 (five) minutes as needed for chest pain. 11/04/14  Yes Abelino Derrick, PA-C  warfarin (COUMADIN) 1 MG tablet Take 1 1/2 tablets daily except 2 tablets on Mondays, Wednesdays and Fridays Patient taking differently: Take 1.5-2 mg by mouth See admin instructions. Take 1 1/2 tablets daily except 2 tablets on Mondays, Wednesdays and Fridays 07/19/15  Yes Chrystie Nose, MD  Amino Acid Infusion (PROSOL) 20 % SOLN  07/21/15   Historical Provider, MD  metoprolol tartrate (LOPRESSOR) 25 MG tablet Take 1 tablet (25 mg total) by mouth 2 (two) times daily. Patient not taking: Reported on 07/31/2015 11/29/14   Runell Gess, MD  multivitamin (RENA-VIT) TABS tablet Take 1 tablet by mouth daily.    Historical Provider, MD  sevelamer carbonate (RENVELA) 800 MG tablet Take 1,600 mg by mouth 2 (two) times daily with a meal.     Historical Provider, MD  sodium bicarbonate 325 MG tablet Take 325 mg by mouth 2 (two) times daily.    Historical Provider, MD    Allergies:  No Known Allergies  Social History:  reports that she has never smoked. She has never used smokeless tobacco. She reports that she does not drink alcohol or use illicit drugs.  Family History: Family History  Problem Relation Age of Onset  . Heart attack Sister   . Hyperlipidemia Mother   . Stroke Father   . Diabetes Brother     Physical Exam: Filed Vitals:   07/31/15 1830 07/31/15 1900 07/31/15 1930 07/31/15 2000  BP: 103/43 107/45 106/43 114/47  Pulse: 71 72 74 73  Temp:      TempSrc:      Resp: Height:      Weight:      SpO2: 94% 93% 93% 94%   General  appearance: alert, cooperative, appears stated age and no distress Head: Normocephalic, without obvious abnormality, atraumatic Eyes: negative Nose: Nares normal. Septum midline. Mucosa normal. No drainage or sinus tenderness. Neck: no JVD and supple, symmetrical, trachea midline Lungs: clear to auscultation bilaterally Heart: regular rate and rhythm and systolic murmur: systolic ejection 4/6, blowing at 2nd left intercostal space Abdomen: soft, non-tender; bowel sounds normal; no masses,  no organomegaly Extremities: extremities normal, atraumatic, no cyanosis or edema Pulses: 2+ and symmetric Skin: Skin color, texture, turgor normal. No rashes or lesions Neurologic: Grossly normal    Labs on Admission:   Recent Labs  07/31/15 1535  NA 138  K 2.7*  CL 97*  CO2 27  GLUCOSE 110*  BUN 31*  CREATININE 3.28*  CALCIUM 8.4*    Recent Labs  07/31/15 1535  WBC  11.3*  HGB 11.0*  HCT 33.8*  MCV 88.9  PLT 262    Recent Labs  07/31/15 1535 07/31/15 1818  TROPONINI 0.05* 0.15*   Radiological Exams on Admission: Dg Chest 2 View  07/31/2015  CLINICAL DATA:  RIGHT-sided chest pain radiating to the RIGHT upper back. EXAM: CHEST  2 VIEW COMPARISON:  Chest radiograph 12/07/2014 FINDINGS: Stable enlarged cardiac silhouette. No effusion, infiltrate or pneumothorax. Chronic bronchitic markings are noted. No overt pulmonary edema. IMPRESSION: Cardiomegaly without acute findings. Electronically Signed   By: Genevive Bi [name]   On: 07/31/2015 16:33   ekg reviewed afib rate controlled no acute issues cxr reviewed CM no edema or infiltrate  Assessment/Plan  82 yo female with weakness, right sided pain during dialysis today with mild elevation of troponin with severe nonoperative aortic stenosis  Principal Problem:   Chest pain- pain is vague and was on right side of body not just her chest.  Her bp probably had significant drop during dialysis but we do not have the details of what  happened during her session with no vitals available.  Pt is not aware of any significant drop as they did not tell her.  She is pain free now.  Will serial her trop overnight, check echo in am.  Pt is anticoagulated already.  Mild elevation of trop now is likely from demand ischemia, would evaluate further if has significant rise overnight.  Active Problems:   CHF (congestive heart failure) (HCC)- compensated at this time   HTN (hypertension)- stable at this time   ESRD (end stage renal disease) on dialysis [facility])- noted, completed session today   Long term current use of anticoagulant therapy- noted inr is over 3, will hold coumadin   PAF (paroxysmal atrial fibrillation)- Amiodarone - NSR- rate controlled at this time   CAD- RCA HSRA/DES 05/15/13-  CFX/OM1 DES 05/19/13- RCA ISR/ PTCA 11/03/14- noted   Severe aortic stenosis- has been evaluated in past and not deemed good surgical candidate which i agree is appropriate   Hypokalemia- repleted in ED,  Repeat level in am  obs on tele.    [name] A 07/31/2015, 8:33 PM

## 2015-07-31 NOTE — Progress Notes (Signed)
Called that [name] presented to [facility] with chest pain after dialysis today. She had PCI in 10/2014 for ISR of the RCA. She has severe AS and is thought not to be an AVR or TAVR candidate. Troponin initially was 0.05, then 0.15. Per Dr. Juleen China, she is now chest pain free. Would consider observing troponin curve overnight - if it stabilizes or decreases, could be discharged on medical therapy. If it rises significantly or there are new chest pain symptoms that develop, we would be happy to bring her to [facility] for further evaluation. The troponin may be related to demand ischemia in the setting of dialysis with severe aortic stenosis.   Chrystie Nose, MD, [facility] Attending Cardiologist [facility]

## 2015-07-31 NOTE — ED Notes (Signed)
CRITICAL VALUE ALERT  Critical value received:  Potassium 2.7 Date of notification:  07/31/15  Time of notification: 1618  Critical value read back: yes  Nurse who received alert:  . Garald Braver  MD notified (1st page): kohut  Time of first page:  1620  MD notified (2nd page):  Time of second page:  Responding MD: Juleen China  Time MD responded:  1620

## 2015-07-31 NOTE — ED Provider Notes (Signed)
CSN: [id number]     Arrival date & time 07/31/15  1526 History   First MD Initiated Contact with Patient 07/31/15 1530     Chief Complaint  Patient presents with  . Chest Pain     (Consider location/radiation/quality/duration/timing/severity/associated sxs/prior Treatment) HPI   82y F with history of coronary artery disease (s/p PCI and stenting of RCA/circumflex and then subsequent cutting balloon angioplasty of RCA stent restenosis), aortic stenosis who is not a surgical candidate, A. fib on Coumadin, CHF, hyperlipidemia, end-stage renal disease on hemodialysis Monday, Wednesday and Friday presenting with R chest/R back pain. Onset around 1030 while at dialysis today.  Describes pain in R side of chest radiating into R mid back. Waxed/waned since onset. Currently denies. Family at bedside and reports patient was diaphoretic at the onset of symptoms. On review of systems, patient reports wet sounding cough for the past several days. No fevers or chills. No unusual swelling anywhere. Reports compliance with dialysis medications.   Past Medical History  Diagnosis Date  . Anemia 03/06/2011  . Hypertension   . Coronary artery disease     PCI 2014, 2016  . Aortic stenosis, mild     severe aortic stenosis  . A-fib (HCC)   . Acute CHF (HCC)   . Hyperlipidemia   . Myocardial infarction (HCC) 2014  . Heart murmur   . Pneumonia 10/2014  . Anxiety   . ESRD (end stage renal disease) on dialysis (HCC)     "MWF; [facility]; [place]" (11/03/2014)   Past Surgical History  Procedure Laterality Date  . Av fistula placement Left ~ 2012    upper arm arm  . Forearm fracture surgery Bilateral     "fell in the basement"  . Fracture surgery    . Pseudoaneurysm repair Right 05/2013    leg  . Cataract extraction w/phaco Right 10/19/2013    Procedure: CATARACT EXTRACTION PHACO AND INTRAOCULAR LENS PLACEMENT (IOC);  Surgeon: Loraine Leriche T. Nile Riggs, MD;  Location: [facility];  Service: Ophthalmology;  Laterality:  Right;  CDE:15.66  . Cataract extraction w/phaco Left 11/02/2013    Procedure: CATARACT EXTRACTION PHACO AND INTRAOCULAR LENS PLACEMENT (IOC);  Surgeon: Loraine Leriche T. Nile Riggs, MD;  Location: [facility];  Service: Ophthalmology;  Laterality: Left;  CDE:8.78  . Left and right heart catheterization with coronary angiogram N/A 05/11/2013    Procedure: LEFT AND RIGHT HEART CATHETERIZATION WITH CORONARY ANGIOGRAM;  Surgeon: Runell Gess, MD;  Location: [facility] CATH LAB;  Service: Cardiovascular;  Laterality: N/A;  . Percutaneous coronary rotoblator intervention (pci-r) N/A 05/14/2013    Procedure: PERCUTANEOUS CORONARY ROTOBLATOR INTERVENTION (PCI-R);  Surgeon: Lennette Bihari, MD;  Location: [facility] CATH LAB;  Service: Cardiovascular;  Laterality: N/A;  . Percutaneous coronary rotoblator intervention (pci-r) N/A 05/19/2013    Procedure: PERCUTANEOUS CORONARY ROTOBLATOR INTERVENTION (PCI-R);  Surgeon: Lennette Bihari, MD;  Location: [facility] CATH LAB;  Service: Cardiovascular;  Laterality: N/A;  . Cardiac catheterization  ; 11/03/2014  . Coronary angioplasty with stent placement    . Cataract extraction w/ intraocular lens  implant, bilateral Bilateral 2015  . Left and right heart catheterization with coronary angiogram N/A 11/03/2014    Procedure: LEFT AND RIGHT HEART CATHETERIZATION WITH CORONARY ANGIOGRAM;  Surgeon: Kathleene Hazel, MD;  Location: [facility] CATH LAB;  Service: Cardiovascular;  Laterality: N/A;   Family History  Problem Relation Age of Onset  . Heart attack Sister   . Hyperlipidemia Mother   . Stroke Father   . Diabetes Brother  Social History  Substance Use Topics  . Smoking status: Never Smoker   . Smokeless tobacco: Never Used  . Alcohol Use: No   OB History    No data available     Review of Systems  All systems reviewed and negative, other than as noted in HPI.   Allergies  Review of patient's allergies indicates no known allergies.  Home Medications   Prior to Admission medications    Medication Sig Start Date End Date Taking? Authorizing Provider  acetaminophen (TYLENOL) 325 MG tablet Take 2 tablets (650 mg total) by mouth every 4 (four) hours as needed for headache or mild pain. Patient not taking: Reported on 11/20/2014 11/04/14   Abelino Derrick, PA-C  albuterol (PROVENTIL HFA;VENTOLIN HFA) 108 (90 BASE) MCG/ACT inhaler Inhale 2 puffs into the lungs every 4 (four) hours as needed for wheezing or shortness of breath. 11/20/14   Margarita Grizzle, MD  ALPRAZolam Prudy Feeler) 0.25 MG tablet Take 1 tablet (0.25 mg total) by mouth 2 (two) times daily as needed for anxiety. Patient not taking: Reported on 11/20/2014 10/20/14   Kathleene Hazel, MD  amiodarone (PACERONE) 200 MG tablet TAKE ONE TABLET BY MOUTH DAILY. 04/26/15   Kathleene Hazel, MD  benzonatate (TESSALON) 100 MG capsule Take 100 mg by mouth 3 (three) times daily as needed for cough.    Historical Provider, MD  chlorpheniramine-HYDROcodone (TUSSIONEX) 10-8 MG/5ML SUER Take 5 mLs by mouth every 12 (twelve) hours as needed for cough. 11/20/14   Margarita Grizzle, MD  darbepoetin (ARANESP) 100 MCG/0.5ML SOLN injection Inject 100 mcg into the skin See admin instructions. Takes every Monday, Wednesday and Friday with dialysis    Historical Provider, MD  doxercalciferol (HECTOROL) 4 MCG/2ML injection Inject 3.5 mcg into the vein every Monday, Wednesday, and Friday with hemodialysis.    Historical Provider, MD  metoprolol tartrate (LOPRESSOR) 25 MG tablet Take 1 tablet (25 mg total) by mouth 2 (two) times daily. 11/29/14   Runell Gess, MD  multivitamin (RENA-VIT) TABS tablet Take 1 tablet by mouth daily.    Historical Provider, MD  nitroGLYCERIN (NITROSTAT) 0.4 MG SL tablet Place 1 tablet (0.4 mg total) under the tongue every 5 (five) minutes as needed for chest pain. 11/04/14   Abelino Derrick, PA-C  sevelamer carbonate (RENVELA) 800 MG tablet Take 1,600 mg by mouth 2 (two) times daily with a meal.     Historical Provider, MD  sodium  bicarbonate 325 MG tablet Take 325 mg by mouth 2 (two) times daily.    Historical Provider, MD  warfarin (COUMADIN) 1 MG tablet Take 1 1/2 tablets daily except 2 tablets on Mondays, Wednesdays and Fridays 07/19/15   Chrystie Nose, MD   BP 102/67 mmHg  Pulse 103  Temp(Src) 98.5 F (36.9 C) (Oral)  Resp 22  Ht  (1.626 m)  Wt 96 lb (43.545 kg)  BMI 16.47 kg/m2  SpO2 93% Physical Exam  Constitutional: She appears well-developed and well-nourished. No distress.  HENT:  Head: Normocephalic and atraumatic.  Eyes: Conjunctivae are normal. Right eye exhibits no discharge. Left eye exhibits no discharge.  Neck: Neck supple.  Cardiovascular: Normal rate, regular rhythm and normal heart sounds.  Exam reveals no gallop and no friction rub.   No murmur heard. Loud systolic murmur. Dialysis graft left upper extremity with thrill.  Pulmonary/Chest: Effort normal and breath sounds normal. No respiratory distress.  Abdominal: Soft. She exhibits no distension. There is no tenderness.  Musculoskeletal: She exhibits  no edema or tenderness.  Neurological: She is alert.  Skin: Skin is warm and dry.  Psychiatric: She has a normal mood and affect. Her behavior is normal. Thought content normal.  Nursing note and vitals reviewed.   ED Course  Procedures (including critical care time) Labs Review Labs Reviewed  CBC - Abnormal; Notable for the following:    WBC 11.3 (*)    RBC 3.80 (*)    Hemoglobin 11.0 (*)    HCT 33.8 (*)    RDW 18.1 (*)    All other components within normal limits  BASIC METABOLIC PANEL  TROPONIN I    Imaging Review No results found. I have personally reviewed and evaluated these images and lab results as part of my medical decision-making.   EKG Interpretation   Date/Time:  Monday July 31 2015 15:26:30 EST Ventricular Rate:  104 PR Interval:    QRS Duration: 132 QT Interval:  412 QTC Calculation: 542 R Axis:   -92 Text Interpretation:  Atrial fibrillation  Nonspecific IVCD with LAD  left  ventricular hypertrophy Confirmed by Juleen China  MD, Mickaela Starlin ([id number]) on  07/31/2015 3:36:22 PM      MDM   Final diagnoses:  Right-sided chest pain    82 year old female with right-sided chest pain. Some typical and also atypical features. She is atrial fibrillation which she has a history of. No overt ischemic changes. Troponin is mildly elevated. Not sure what to make of this and patient with end-stage renal disease and severe aortic stenosis. Troponin did rise somewhat on repeat although is still only mildly elevated. Discussed briefly with cardiology via telephone. I feel is reasonable to admit patient for continued trending troponin. At this point they feel it is reasonable to keep her at [facility]. Potassium was noted to be low. She was only given a small amount of supplementation. Expect this to further rise by itself as she has end-stage renal disease.    Raeford Razor, MD 08/02/15 [phone]

## 2015-08-01 DIAGNOSIS — R079 Chest pain, unspecified: Secondary | ICD-10-CM | POA: Diagnosis not present

## 2015-08-01 DIAGNOSIS — Z992 Dependence on renal dialysis: Secondary | ICD-10-CM

## 2015-08-01 DIAGNOSIS — N186 End stage renal disease: Secondary | ICD-10-CM | POA: Diagnosis not present

## 2015-08-01 DIAGNOSIS — Z7901 Long term (current) use of anticoagulants: Secondary | ICD-10-CM | POA: Diagnosis not present

## 2015-08-01 LAB — PROTIME-INR
INR: 2.71 — AB (ref 0.00–1.49)
PROTHROMBIN TIME: 28.4 s — AB (ref 11.6–15.2)

## 2015-08-01 LAB — BASIC METABOLIC PANEL
ANION GAP: 14 (ref 5–15)
BUN: 39 mg/dL — ABNORMAL HIGH (ref 6–20)
CHLORIDE: 98 mmol/L — AB (ref 101–111)
CO2: 26 mmol/L (ref 22–32)
Calcium: 8.3 mg/dL — ABNORMAL LOW (ref 8.9–10.3)
Creatinine, Ser: 4.13 mg/dL — ABNORMAL HIGH (ref 0.44–1.00)
GFR calc non Af Amer: 9 mL/min — ABNORMAL LOW (ref >60)
GFR, EST AFRICAN AMERICAN: 10 mL/min — AB (ref >60)
Glucose, Bld: 86 mg/dL (ref 65–99)
POTASSIUM: 3.3 mmol/L — AB (ref 3.5–5.1)
SODIUM: 138 mmol/L (ref 135–145)

## 2015-08-01 LAB — TROPONIN I
TROPONIN I: 0.11 ng/mL — AB (ref <0.031)
Troponin I: 0.29 ng/mL — ABNORMAL HIGH (ref <0.031)
Troponin I: 0.21 ng/mL — ABNORMAL HIGH (ref <0.031)
Troponin I: 0.13 ng/mL — ABNORMAL HIGH (ref <0.031)

## 2015-08-01 LAB — MRSA PCR SCREENING: MRSA by PCR: NEGATIVE

## 2015-08-01 MED ORDER — ATORVASTATIN CALCIUM 40 MG PO TABS
40.0000 mg | ORAL_TABLET | Freq: Every day | ORAL | Status: DC
  Administered 2015-08-01 – 2015-08-02 (×2): 40 mg via ORAL
  Filled 2015-08-01 (×2): qty 1

## 2015-08-01 MED ORDER — WARFARIN SODIUM 1 MG PO TABS
1.5000 mg | ORAL_TABLET | Freq: Once | ORAL | Status: AC
  Administered 2015-08-01: 1.5 mg via ORAL
  Filled 2015-08-01: qty 2

## 2015-08-01 MED ORDER — BENZONATATE 100 MG PO CAPS
100.0000 mg | ORAL_CAPSULE | Freq: Three times a day (TID) | ORAL | Status: DC | PRN
  Administered 2015-08-01 – 2015-08-02 (×3): 100 mg via ORAL
  Filled 2015-08-01 (×3): qty 1

## 2015-08-01 MED ORDER — WARFARIN - PHARMACIST DOSING INPATIENT
Freq: Every day | Status: DC
  Administered 2015-08-01 – 2015-08-02 (×2)

## 2015-08-01 MED ORDER — ISOSORBIDE MONONITRATE ER 60 MG PO TB24
30.0000 mg | ORAL_TABLET | Freq: Every day | ORAL | Status: DC
  Administered 2015-08-01 – 2015-08-02 (×2): 30 mg via ORAL
  Filled 2015-08-01 (×2): qty 1

## 2015-08-01 NOTE — Progress Notes (Signed)
ANTICOAGULATION CONSULT NOTE - Initial Consult  Pharmacy Consult for Warfarin Indication: atrial fibrillation  No Known Allergies  Patient Measurements: Height:  (162.6 cm) Weight: 97 lb 14.2 oz (44.4 kg) IBW/kg (Calculated) : 54.7  Vital Signs: Temp: 98.5 F (36.9 C) (01/24 0557) Temp Source: Oral (01/24 0557) BP: 107/40 mmHg (01/24 0557) Pulse Rate: 67 (01/24 0557)  Labs:  Recent Labs  07/31/15 1535  07/31/15 2156 08/01/15 0343 08/01/15 0913  HGB 11.0*  --   --   --   --   HCT 33.8*  --   --   --   --   PLT 262  --   --   --   --   LABPROT 33.2*  --   --   --   --   INR 3.34*  --   --   --   --   CREATININE 3.28*  --   --  4.13*  --   TROPONINI 0.05*  < > 0.28* 0.29* 0.21*  < > = values in this interval not displayed.  Estimated Creatinine Clearance: 7 mL/min (by C-G formula based on Cr of 4.13).   Medical History: Past Medical History  Diagnosis Date  . Anemia 03/06/2011  . Hypertension   . Coronary artery disease     PCI 2014, 2016  . Aortic stenosis, mild     severe aortic stenosis  . A-fib (HCC)   . Acute CHF (HCC)   . Hyperlipidemia   . Myocardial infarction (HCC) 2014  . Heart murmur   . Pneumonia 10/2014  . Anxiety   . ESRD (end stage renal disease) on dialysis (HCC)     "MWF; [facility]; [place]" (11/03/2014)    Medications:  Prescriptions prior to admission  Medication Sig Dispense Refill Last Dose  . albuterol (PROVENTIL HFA;VENTOLIN HFA) 108 (90 BASE) MCG/ACT inhaler Inhale 2 puffs into the lungs every 4 (four) hours as needed for wheezing or shortness of breath. 1 Inhaler 0 unknown  . amiodarone (PACERONE) 200 MG tablet TAKE ONE TABLET BY MOUTH DAILY. 30 tablet 5 07/30/2015 at Unknown time  . chlorpheniramine-HYDROcodone (TUSSIONEX) 10-8 MG/5ML SUER Take 5 mLs by mouth every 12 (twelve) hours as needed for cough. 140 mL 0 Past Week at Unknown time  . darbepoetin (ARANESP) 100 MCG/0.5ML SOLN injection Inject 100 mcg into the skin See admin  instructions. Takes every Monday, Wednesday and Friday with dialysis   07/31/2015 at 1000  . doxercalciferol (HECTOROL) 4 MCG/2ML injection Inject 3.5 mcg into the vein every Monday, Wednesday, and Friday with hemodialysis.   07/31/2015 at 1000  . Guaifenesin (MUCINEX MAXIMUM STRENGTH) 1200 MG TB12 Take 1 tablet by mouth 2 (two) times daily as needed (for congestion/cough).   07/30/2015 at Unknown time  . nitroGLYCERIN (NITROSTAT) 0.4 MG SL tablet Place 1 tablet (0.4 mg total) under the tongue every 5 (five) minutes as needed for chest pain. 25 tablet 2 07/31/2015 at Unknown time  . warfarin (COUMADIN) 1 MG tablet Take 1 1/2 tablets daily except 2 tablets on Mondays, Wednesdays and Fridays (Patient taking differently: Take 1.5-2 mg by mouth See admin instructions. Take 1 1/2 tablets daily except 2 tablets on Mondays, Wednesdays and Fridays) 60 tablet 3 07/30/2015 at 1800  . Amino Acid Infusion (PROSOL) 20 % SOLN      . metoprolol tartrate (LOPRESSOR) 25 MG tablet Take 1 tablet (25 mg total) by mouth 2 (two) times daily. (Patient not taking: Reported on 07/31/2015) 60 tablet 5   .  multivitamin (RENA-VIT) TABS tablet Take 1 tablet by mouth daily.   11/19/2014 at Unknown time  . sevelamer carbonate (RENVELA) 800 MG tablet Take 1,600 mg by mouth 2 (two) times daily with a meal.    11/19/2014 at Unknown time  . sodium bicarbonate 325 MG tablet Take 325 mg by mouth 2 (two) times daily.   11/19/2014 at Unknown time    Assessment: 82 yo ESRD with HD on MWF, admitted for chest pain. She had problems with blood pressure. Chronically anticoagulated for afib.  INR elevated on admission yesterday and dose held. INR ordered for this AM. INR is 2.71. Restart Coumadin  Goal of Therapy:  INR 2-3 Monitor platelets by anticoagulation protocol: Yes   Plan:  Coumadin 1.5mg  today Monitor for s/s of bleeding Daily PT/INR  Elder Cyphers, BS Loura Back, BCPS Clinical Pharmacist Pager [phone] 08/01/2015;  1325 08/01/2015,10:10 AM

## 2015-08-01 NOTE — Progress Notes (Signed)
[facility] PROGRESS NOTE  SREEJA SPIES IZT:[id number] DOB: Feb 16, 1928    PCP:   [name], MD   HPI:  82 yo female h/o ESRD dialysis, HTN, severe AS, CAD comes in after experiencing an episode during dialysis today when she got very weak and had pain along the right side of her body, she says it was in her chest all the way down just on the right side. No weakness. No fevers. She has been coughing a lot. No weight gain. She feels back to normal now. Her bp sometimes drops during dialysis and she did pass out once last year but usually she tolerates her sessions well. She is being referred for admission for chest pain with Mild elevation of her troponin.  Her troponin continued to trended up, and currently at 0.23.  Her right flank pain has resolved.    Rewiew of Systems:  Constitutional: Negative for malaise, fever and chills. No significant weight loss or weight gain Eyes: Negative for eye pain, redness and discharge, diplopia, visual changes, or flashes of light. ENMT: Negative for ear pain, hoarseness, nasal congestion, sinus pressure and sore throat. No headaches; tinnitus, drooling, or problem swallowing. Cardiovascular: Negative for chest pain, palpitations, diaphoresis, dyspnea and peripheral edema. ; No orthopnea, PND Respiratory: Negative for cough, hemoptysis, wheezing and stridor. No pleuritic chestpain. Gastrointestinal: Negative for nausea, vomiting, diarrhea, constipation, abdominal pain, melena, blood in stool, hematemesis, jaundice and rectal bleeding.    Genitourinary: Negative for frequency, dysuria, incontinence,flank pain and hematuria; Musculoskeletal: Negative for back pain and neck pain. Negative for swelling and trauma.;  Skin: . Negative for pruritus, rash, abrasions, bruising and skin lesion.; ulcerations Neuro: Negative for headache, lightheadedness and neck stiffness. Negative for weakness, altered level of consciousness , altered mental status,  extremity weakness, burning feet, involuntary movement, seizure and syncope.  Psych: negative for anxiety, depression, insomnia, tearfulness, panic attacks, hallucinations, paranoia, suicidal or homicidal ideation    Past Medical History  Diagnosis Date  . Anemia 03/06/2011  . Hypertension   . Coronary artery disease     PCI 2014, 2016  . Aortic stenosis, mild     severe aortic stenosis  . A-fib (HCC)   . Acute CHF (HCC)   . Hyperlipidemia   . Myocardial infarction (HCC) 2014  . Heart murmur   . Pneumonia 10/2014  . Anxiety   . ESRD (end stage renal disease) on dialysis (HCC)     "MWF; [facility]; [place]" (11/03/2014)    Past Surgical History  Procedure Laterality Date  . Av fistula placement Left ~ 2012    upper arm arm  . Forearm fracture surgery Bilateral     "fell in the basement"  . Fracture surgery    . Pseudoaneurysm repair Right 05/2013    leg  . Cataract extraction w/phaco Right 10/19/2013    Procedure: CATARACT EXTRACTION PHACO AND INTRAOCULAR LENS PLACEMENT (IOC);  Surgeon: Loraine Leriche T. Nile Riggs, MD;  Location: [facility];  Service: Ophthalmology;  Laterality: Right;  CDE:15.66  . Cataract extraction w/phaco Left 11/02/2013    Procedure: CATARACT EXTRACTION PHACO AND INTRAOCULAR LENS PLACEMENT (IOC);  Surgeon: Loraine Leriche T. Nile Riggs, MD;  Location: [facility];  Service: Ophthalmology;  Laterality: Left;  CDE:8.78  . Left and right heart catheterization with coronary angiogram N/A 05/11/2013    Procedure: LEFT AND RIGHT HEART CATHETERIZATION WITH CORONARY ANGIOGRAM;  Surgeon: Runell Gess, MD;  Location: [facility] CATH LAB;  Service: Cardiovascular;  Laterality: N/A;  . Percutaneous coronary rotoblator intervention (pci-r) N/A  05/14/2013    Procedure: PERCUTANEOUS CORONARY ROTOBLATOR INTERVENTION (PCI-R);  Surgeon: Lennette Bihari, MD;  Location: [facility] CATH LAB;  Service: Cardiovascular;  Laterality: N/A;  . Percutaneous coronary rotoblator intervention (pci-r) N/A 05/19/2013    Procedure:  PERCUTANEOUS CORONARY ROTOBLATOR INTERVENTION (PCI-R);  Surgeon: Lennette Bihari, MD;  Location: [facility] CATH LAB;  Service: Cardiovascular;  Laterality: N/A;  . Cardiac catheterization  ; 11/03/2014  . Coronary angioplasty with stent placement    . Cataract extraction w/ intraocular lens  implant, bilateral Bilateral 2015  . Left and right heart catheterization with coronary angiogram N/A 11/03/2014    Procedure: LEFT AND RIGHT HEART CATHETERIZATION WITH CORONARY ANGIOGRAM;  Surgeon: Kathleene Hazel, MD;  Location: [facility] CATH LAB;  Service: Cardiovascular;  Laterality: N/A;    Medications:  HOME MEDS: Prior to Admission medications   Medication Sig Start Date End Date Taking? Authorizing Provider  albuterol (PROVENTIL HFA;VENTOLIN HFA) 108 (90 BASE) MCG/ACT inhaler Inhale 2 puffs into the lungs every 4 (four) hours as needed for wheezing or shortness of breath. 11/20/14  Yes Margarita Grizzle, MD  amiodarone (PACERONE) 200 MG tablet TAKE ONE TABLET BY MOUTH DAILY. 04/26/15  Yes Kathleene Hazel, MD  chlorpheniramine-HYDROcodone (TUSSIONEX) 10-8 MG/5ML SUER Take 5 mLs by mouth every 12 (twelve) hours as needed for cough. 11/20/14  Yes Margarita Grizzle, MD  darbepoetin (ARANESP) 100 MCG/0.5ML SOLN injection Inject 100 mcg into the skin See admin instructions. Takes every Monday, Wednesday and Friday with dialysis   Yes Historical Provider, MD  doxercalciferol (HECTOROL) 4 MCG/2ML injection Inject 3.5 mcg into the vein every Monday, Wednesday, and Friday with hemodialysis.   Yes Historical Provider, MD  Guaifenesin (MUCINEX MAXIMUM STRENGTH) 1200 MG TB12 Take 1 tablet by mouth 2 (two) times daily as needed (for congestion/cough).   Yes Historical Provider, MD  nitroGLYCERIN (NITROSTAT) 0.4 MG SL tablet Place 1 tablet (0.4 mg total) under the tongue every 5 (five) minutes as needed for chest pain. 11/04/14  Yes Abelino Derrick, PA-C  warfarin (COUMADIN) 1 MG tablet Take 1 1/2 tablets daily except 2 tablets on  Mondays, Wednesdays and Fridays Patient taking differently: Take 1.5-2 mg by mouth See admin instructions. Take 1 1/2 tablets daily except 2 tablets on Mondays, Wednesdays and Fridays 07/19/15  Yes Chrystie Nose, MD  Amino Acid Infusion (PROSOL) 20 % SOLN  07/21/15   Historical Provider, MD  metoprolol tartrate (LOPRESSOR) 25 MG tablet Take 1 tablet (25 mg total) by mouth 2 (two) times daily. Patient not taking: Reported on 07/31/2015 11/29/14   Runell Gess, MD  multivitamin (RENA-VIT) TABS tablet Take 1 tablet by mouth daily.    Historical Provider, MD  sevelamer carbonate (RENVELA) 800 MG tablet Take 1,600 mg by mouth 2 (two) times daily with a meal.     Historical Provider, MD  sodium bicarbonate 325 MG tablet Take 325 mg by mouth 2 (two) times daily.    Historical Provider, MD     Allergies:  No Known Allergies  Social History:   reports that she has never smoked. She has never used smokeless tobacco. She reports that she does not drink alcohol or use illicit drugs.  Family History: Family History  Problem Relation Age of Onset  . Heart attack Sister   . Hyperlipidemia Mother   . Stroke Father   . Diabetes Brother      Physical Exam: Filed Vitals:   07/31/15 2137 07/31/15 2155 08/01/15 0150 08/01/15 0557  BP:  105/41  106/38 107/40  Pulse:  75 66 67  Temp:  98 F (36.7 C) 98.2 F (36.8 C) 98.5 F (36.9 C)  TempSrc:  Oral Oral Oral  Resp:  Height:  (1.626 m)     Weight: 44.2 kg (97 lb 7.1 oz)   44.4 kg (97 lb 14.2 oz)  SpO2:  99% 99% 100%   Blood pressure 107/40, pulse 67, temperature 98.5 F (36.9 C), temperature source Oral, resp. rate 18, height  (1.626 m), weight 44.4 kg (97 lb 14.2 oz), SpO2 100 %.  GEN:  Pleasant  patient lying in the stretcher in no acute distress; cooperative with exam. PSYCH:  alert and oriented x4; does not appear anxious or depressed; affect is appropriate. HEENT: Mucous membranes pink and anicteric; PERRLA; EOM  intact; no cervical lymphadenopathy nor thyromegaly or carotid bruit; no JVD; There were no stridor. Neck is very supple. Breasts:: Not examined CHEST WALL: No tenderness CHEST: Normal respiration, clear to auscultation bilaterally.  HEART: Regular rate and rhythm.  There are no murmur, rub, or gallops.   BACK: No kyphosis or scoliosis; no CVA tenderness ABDOMEN: soft and non-tender; no masses, no organomegaly, normal abdominal bowel sounds; no pannus; no intertriginous candida. There is no rebound and no distention. Rectal Exam: Not done EXTREMITIES: No bone or joint deformity; age-appropriate arthropathy of the hands and knees; no edema; no ulcerations.  There is no calf tenderness. Genitalia: not examined PULSES: 2+ and symmetric SKIN: Normal hydration no rash or ulceration CNS: Cranial nerves 2-12 grossly intact no focal lateralizing neurologic deficit.  Speech is fluent; uvula elevated with phonation, facial symmetry and tongue midline. DTR are normal bilaterally, cerebella exam is intact, barbinski is negative and strengths are equaled bilaterally.  No sensory loss.   Labs on Admission:  Basic Metabolic Panel:  Recent Labs Lab 07/31/15 1535 08/01/15 0343  NA 138 138  K 2.7* 3.3*  CL 97* 98*  CO2 27 26  GLUCOSE 110* 86  BUN 31* 39*  CREATININE 3.28* 4.13*  CALCIUM 8.4* 8.3*   Liver Function Tests: CBC:  Recent Labs Lab 07/31/15 1535  WBC 11.3*  HGB 11.0*  HCT 33.8*  MCV 88.9  PLT 262   Cardiac Enzymes:  Recent Labs Lab 07/31/15 1535 07/31/15 1818 07/31/15 2156 08/01/15 0343 08/01/15 0913  TROPONINI 0.05* 0.15* 0.28* 0.29* 0.21*    Radiological Exams on Admission: Dg Chest 2 View  07/31/2015  CLINICAL DATA:  RIGHT-sided chest pain radiating to the RIGHT upper back. EXAM: CHEST  2 VIEW COMPARISON:  Chest radiograph 12/07/2014 FINDINGS: Stable enlarged cardiac silhouette. No effusion, infiltrate or pneumothorax. Chronic bronchitic markings are noted. No overt  pulmonary edema. IMPRESSION: Cardiomegaly without acute findings. Electronically Signed   By: Genevive Bi [name]   On: 07/31/2015 16:33    EKG: Independently reviewed.    Assessment/Plan Present on Admission:  . HTN (hypertension) . PAF (paroxysmal atrial fibrillation)- Amiodarone - NSR . CAD- RCA HSRA/DES 05/15/13-  CFX/OM1 DES 05/19/13- RCA ISR/ PTCA 11/03/14 . Chest pain . Hypokalemia   PLAN:  ATYPICAL CP:  Her troponin peaked at about 0.29.  Await ECHO.  Will add Imdur, and statin to her ASA.  I will consult cardiology for further recommendation.    ESRD:  Dr Fausto Skillern is aware of her admission.  She can go home from his stand point.   PAF:  Continue with amiodarone.  NSR.  Other plans as per orders. Code Status:FULL CODE>  Houston Siren, MD.  FACP [facility] Pager [phone] 7pm to 7am.  08/01/2015, 11:36 AM

## 2015-08-01 NOTE — Care Management Obs Status (Signed)
MEDICARE OBSERVATION STATUS NOTIFICATION   Patient Details  Name: [name] MRN: [id number] Date of Birth: 10/17/1927   Medicare Observation Status Notification Given:  Yes    Malcolm Metro, RN 08/01/2015, 2:12 PM

## 2015-08-01 NOTE — Care Management Note (Signed)
Case Management Note  Patient Details  Name: [name] MRN: [id number] Date of Birth: June 04, 1928  Subjective/Objective:                  Pt is from home, lives with her husband. Pt has [employer] aid at home 3 hrs per day. Pt has three children who provide support as needed. Pt recently DC'd from palliative services. Pt receives HD MWF at [facility] in [place]. Pt is wheelchair bound. Pt has hospital bed and has borrowed WC from friend. Pt has been ordered WC and has requested DME come from [place]. Pt has chosen to have WC delivered to her home.   Action/Plan: WC order and pt info faxed to [employer].  Will cont to follow for DC planning.   Expected Discharge Date:      08/02/2015            Expected Discharge Plan:  Home/Self Care  In-House Referral:  NA  Discharge planning Services  CM Consult  Post Acute Care Choice:  Durable Medical Equipment Choice offered to:  Patient  DME Arranged:  Wheelchair manual DME Agency:  [facility]  HH Arranged:    [facility] Agency:     Status of Service:  In process, will continue to follow  Medicare Important Message Given:    Date Medicare IM Given:    Medicare IM give by:    Date Additional Medicare IM Given:    Additional Medicare Important Message give by:     If discussed at Long Length of Stay Meetings, dates discussed:    Additional Comments:  Malcolm Metro, RN 08/01/2015, 2:13 PM

## 2015-08-02 ENCOUNTER — Observation Stay (HOSPITAL_BASED_OUTPATIENT_CLINIC_OR_DEPARTMENT_OTHER): Payer: Medicare Other

## 2015-08-02 ENCOUNTER — Encounter

## 2015-08-02 DIAGNOSIS — I209 Angina pectoris, unspecified: Secondary | ICD-10-CM

## 2015-08-02 DIAGNOSIS — R079 Chest pain, unspecified: Secondary | ICD-10-CM | POA: Diagnosis not present

## 2015-08-02 DIAGNOSIS — I480 Paroxysmal atrial fibrillation: Secondary | ICD-10-CM

## 2015-08-02 DIAGNOSIS — Z7901 Long term (current) use of anticoagulants: Secondary | ICD-10-CM

## 2015-08-02 DIAGNOSIS — N186 End stage renal disease: Secondary | ICD-10-CM | POA: Diagnosis not present

## 2015-08-02 DIAGNOSIS — I2510 Atherosclerotic heart disease of native coronary artery without angina pectoris: Secondary | ICD-10-CM | POA: Diagnosis not present

## 2015-08-02 DIAGNOSIS — I350 Nonrheumatic aortic (valve) stenosis: Secondary | ICD-10-CM

## 2015-08-02 LAB — RENAL FUNCTION PANEL
Albumin: 2.7 g/dL — ABNORMAL LOW (ref 3.5–5.0)
Anion gap: 14 (ref 5–15)
BUN: 55 mg/dL — ABNORMAL HIGH (ref 6–20)
CO2: 21 mmol/L — ABNORMAL LOW (ref 22–32)
Calcium: 8.1 mg/dL — ABNORMAL LOW (ref 8.9–10.3)
Chloride: 98 mmol/L — ABNORMAL LOW (ref 101–111)
Creatinine, Ser: 5.68 mg/dL — ABNORMAL HIGH (ref 0.44–1.00)
GFR calc Af Amer: 7 mL/min — ABNORMAL LOW (ref >60)
GFR calc non Af Amer: 6 mL/min — ABNORMAL LOW (ref >60)
Glucose, Bld: 69 mg/dL (ref 65–99)
Phosphorus: 4.8 mg/dL — ABNORMAL HIGH (ref 2.5–4.6)
Potassium: 3.6 mmol/L (ref 3.5–5.1)
Sodium: 133 mmol/L — ABNORMAL LOW (ref 135–145)

## 2015-08-02 LAB — PROTIME-INR
INR: 3.41 — AB (ref 0.00–1.49)
Prothrombin Time: 33.7 seconds — ABNORMAL HIGH (ref 11.6–15.2)

## 2015-08-02 LAB — CBC
HCT: 27.8 % — ABNORMAL LOW (ref 36.0–46.0)
Hemoglobin: 9.3 g/dL — ABNORMAL LOW (ref 12.0–15.0)
MCH: 29.2 pg (ref 26.0–34.0)
MCHC: 33.5 g/dL (ref 30.0–36.0)
MCV: 87.4 fL (ref 78.0–100.0)
Platelets: 269 10*3/uL (ref 150–400)
RBC: 3.18 MIL/uL — ABNORMAL LOW (ref 3.87–5.11)
RDW: 17.8 % — ABNORMAL HIGH (ref 11.5–15.5)
WBC: 11 10*3/uL — ABNORMAL HIGH (ref 4.0–10.5)

## 2015-08-02 MED ORDER — EPOETIN ALFA 2000 UNIT/ML IJ SOLN
2000.0000 [IU] | INTRAMUSCULAR | Status: DC
  Administered 2015-08-02: 2000 [IU] via INTRAVENOUS

## 2015-08-02 MED ORDER — SODIUM CHLORIDE 0.9 % IV SOLN: 100.0000 mL | INTRAVENOUS | Status: DC | PRN

## 2015-08-02 MED ORDER — ISOSORBIDE MONONITRATE ER 30 MG PO TB24: 30.0000 mg | ORAL_TABLET | Freq: Every day | ORAL | Status: AC

## 2015-08-02 MED ORDER — DOXERCALCIFEROL 4 MCG/2ML IV SOLN
INTRAVENOUS | Status: AC
  Administered 2015-08-02: 3.5 ug via INTRAVENOUS
  Filled 2015-08-02: qty 2

## 2015-08-02 MED ORDER — ATORVASTATIN CALCIUM 40 MG PO TABS: 40.0000 mg | ORAL_TABLET | Freq: Every day | ORAL | Status: DC

## 2015-08-02 MED ORDER — EPOETIN ALFA 4000 UNIT/ML IJ SOLN
INTRAMUSCULAR | Status: AC
  Administered 2015-08-02: 2000 [IU]
  Filled 2015-08-02: qty 1

## 2015-08-02 MED ORDER — LIDOCAINE-PRILOCAINE 2.5-2.5 % EX CREA: 1.0000 "application " | TOPICAL_CREAM | CUTANEOUS | Status: DC | PRN

## 2015-08-02 MED ORDER — BENZONATATE 100 MG PO CAPS: 100.0000 mg | ORAL_CAPSULE | Freq: Three times a day (TID) | ORAL | Status: AC | PRN

## 2015-08-02 MED ORDER — PENTAFLUOROPROP-TETRAFLUOROETH EX AERO
1.0000 "application " | INHALATION_SPRAY | CUTANEOUS | Status: DC | PRN
  Administered 2015-08-02: 1 via TOPICAL

## 2015-08-02 MED ORDER — LIDOCAINE HCL (PF) 1 % IJ SOLN: 5.0000 mL | INTRAMUSCULAR | Status: DC | PRN

## 2015-08-02 MED ORDER — EPOETIN ALFA 2000 UNIT/ML IJ SOLN
2000.0000 [IU] | INTRAMUSCULAR | Status: DC
  Filled 2015-08-02: qty 1

## 2015-08-02 MED ORDER — PENTAFLUOROPROP-TETRAFLUOROETH EX AERO
INHALATION_SPRAY | CUTANEOUS | Status: AC
  Administered 2015-08-02: 1 via TOPICAL
  Filled 2015-08-02: qty 103.5

## 2015-08-02 NOTE — Progress Notes (Signed)
Patient with orders to be discharge home. Discharge instructions given, patient's daughter verbalized understanding. Prescriptions given. Patient stable. Patient left in private vehicle with family.

## 2015-08-02 NOTE — Discharge Summary (Signed)
Physician Discharge Summary  [name] ZOX:[id number] DOB: Mar 24, 1928 DOA: 07/31/2015  PCP: [name], MD  Admit date: 07/31/2015 Discharge date: 08/02/2015  Time spent: 45 minutes  Recommendations for Outpatient Follow-up:  -Will be discharged home today after HD session. -Advised to follow up with PCP in 2 weeks.  Discharge Diagnoses:  Principal Problem:   Chest pain Active Problems:   CHF (congestive heart failure) (HCC)   HTN (hypertension)   ESRD (end stage renal disease) on dialysis (HCC)   Long term current use of anticoagulant therapy   PAF (paroxysmal atrial fibrillation)- Amiodarone - NSR   CAD- RCA HSRA/DES 05/15/13-  CFX/OM1 DES 05/19/13- RCA ISR/ PTCA 11/03/14   Severe aortic stenosis   Hypokalemia   Discharge Condition: Stable and improved  Filed Weights   07/31/15 2137 08/01/15 0557 08/02/15 0541  Weight: 44.2 kg (97 lb 7.1 oz) 44.4 kg (97 lb 14.2 oz) 44.3 kg (97 lb 10.6 oz)    History of present illness:  As per Dr. Onalee Hua 1/23: 82 yo female h/o ESRD dialysis, HTN, severe AS, CAD comes in after experiencing an episode during dialysis today when she got very weak and had pain along the right side of her body, she says it was in her chest all the way down just on the right side. No weakness. No fevers. She has been coughing a lot. No weight gain. She feels back to normal now. Her bp sometimes drops during dialysis and she did pass out once last year but usually she tolerates her sessions well. She is being referred for admission for chest pain with Mild elevation of her troponin.  Hospital Course:   Atypical chest pain. -Troponin peaked at about 0.29 -Agree with Imdur, statin, aspirin. -2-D echo: Left ventricle: The cavity size was normal. Wall thickness was increased in a pattern of moderate LVH. Systolic function was normal. The estimated ejection fraction was in the range of 55% to 60%.  Aortic valve: There was severe stenosis.  There was mild regurgitation. Valve area (VTI): 0.54 cm^2 -Seen by cardiology without further plans for workup at this time.  End-stage renal disease -Due for dialysis today.  Procedures:  None   Consultations:  Cardiology  Nephrology  Discharge Instructions  Discharge Instructions    Care order/instruction    Complete by:  As directed   DC home after HD     Diet - low sodium heart healthy    Complete by:  As directed      Increase activity slowly    Complete by:  As directed             Medication List    STOP taking these medications        metoprolol tartrate 25 MG tablet  Commonly known as:  LOPRESSOR     PROSOL 20 % Soln      TAKE these medications        albuterol 108 (90 Base) MCG/ACT inhaler  Commonly known as:  PROVENTIL HFA;VENTOLIN HFA  Inhale 2 puffs into the lungs every 4 (four) hours as needed for wheezing or shortness of breath.     amiodarone 200 MG tablet  Commonly known as:  PACERONE  TAKE ONE TABLET BY MOUTH DAILY.     atorvastatin 40 MG tablet  Commonly known as:  LIPITOR  Take 1 tablet (40 mg total) by mouth daily at 6 PM.     benzonatate 100 MG capsule  Commonly known as:  TESSALON  Take 1 capsule (100 mg total) by mouth 3 (three) times daily as needed for cough.     chlorpheniramine-HYDROcodone 10-8 MG/5ML Suer  Commonly known as:  TUSSIONEX  Take 5 mLs by mouth every 12 (twelve) hours as needed for cough.     darbepoetin 100 MCG/0.5ML Soln injection  Commonly known as:  ARANESP  Inject 100 mcg into the skin See admin instructions. Takes every Monday, Wednesday and Friday with dialysis     doxercalciferol 4 MCG/2ML injection  Commonly known as:  HECTOROL  Inject 3.5 mcg into the vein every Monday, Wednesday, and Friday with hemodialysis.     isosorbide mononitrate 30 MG 24 hr tablet  Commonly known as:  IMDUR  Take 1 tablet (30 mg total) by mouth daily.     MUCINEX MAXIMUM STRENGTH 1200 MG Tb12  Generic drug:   Guaifenesin  Take 1 tablet by mouth 2 (two) times daily as needed (for congestion/cough).     multivitamin Tabs tablet  Take 1 tablet by mouth daily.     nitroGLYCERIN 0.4 MG SL tablet  Commonly known as:  NITROSTAT  Place 1 tablet (0.4 mg total) under the tongue every 5 (five) minutes as needed for chest pain.     sevelamer carbonate 800 MG tablet  Commonly known as:  RENVELA  Take 1,600 mg by mouth 2 (two) times daily with a meal.     sodium bicarbonate 325 MG tablet  Take 325 mg by mouth 2 (two) times daily.     warfarin 1 MG tablet  Commonly known as:  COUMADIN  Take 1 1/2 tablets daily except 2 tablets on Mondays, Wednesdays and Fridays       No Known Allergies     Follow-up Information    Follow up with [facility], MD. Schedule an appointment as soon as possible for a visit in 2 weeks.   Specialty:  Family Medicine   Contact information:   [address] [place] [address] [phone]        The results of significant diagnostics from this hospitalization (including imaging, microbiology, ancillary and laboratory) are listed below for reference.    Significant Diagnostic Studies: Dg Chest 2 View  07/31/2015  CLINICAL DATA:  RIGHT-sided chest pain radiating to the RIGHT upper back. EXAM: CHEST  2 VIEW COMPARISON:  Chest radiograph 12/07/2014 FINDINGS: Stable enlarged cardiac silhouette. No effusion, infiltrate or pneumothorax. Chronic bronchitic markings are noted. No overt pulmonary edema. IMPRESSION: Cardiomegaly without acute findings. Electronically Signed   By: Genevive Bi [name]   On: 07/31/2015 16:33    Microbiology: Recent Results (from the past 240 hour(s))  MRSA PCR Screening     Status: None   Collection Time: 07/31/15 11:15 PM  Result Value Ref Range Status   MRSA by PCR NEGATIVE NEGATIVE Final    Comment:        The GeneXpert MRSA Assay (FDA approved for NASAL specimens only), is one component of a comprehensive MRSA  colonization surveillance program. It is not intended to diagnose MRSA infection nor to guide or monitor treatment for MRSA infections.      Labs: Basic Metabolic Panel:  Recent Labs Lab 07/31/15 1535 08/01/15 0343  NA 138 138  K 2.7* 3.3*  CL 97* 98*  CO2 27 26  GLUCOSE 110* 86  BUN 31* 39*  CREATININE 3.28* 4.13*  CALCIUM 8.4* 8.3*   Liver Function Tests: No results for input(s): AST, ALT, ALKPHOS, BILITOT, PROT, ALBUMIN in the  last 168 hours. No results for input(s): LIPASE, AMYLASE in the last 168 hours. No results for input(s): AMMONIA in the last 168 hours. CBC:  Recent Labs Lab 07/31/15 1535  WBC 11.3*  HGB 11.0*  HCT 33.8*  MCV 88.9  PLT 262   Cardiac Enzymes:  Recent Labs Lab 07/31/15 2156 08/01/15 0343 08/01/15 0913 08/01/15 1622 08/01/15 2205  TROPONINI 0.28* 0.29* 0.21* 0.13* 0.11*   BNP: BNP (last 3 results)  Recent Labs  10/12/14 2345 11/20/14 1019 07/31/15 1535  BNP >4500.0* >4500.0* >4500.0*    ProBNP (last 3 results) No results for input(s): PROBNP in the last 8760 hours.  CBG: No results for input(s): GLUCAP in the last 168 hours.     SignedChaya Jan  [facility] Pager: [phone] 08/02/2015, 11:08 AM

## 2015-08-02 NOTE — Consult Note (Signed)
CARDIOLOGY CONSULT NOTE       Patient ID: [name] MRN: [id number] DOB/AGE: 1928/02/17 82 y.o.  Admit date: 07/31/2015 Referring Physician: Philip Aspen Primary Physician: [name], MD Primary Cardiologist:  Allyson Sabal Reason for Consultation:  Chest Pain  Principal Problem:   Chest pain Active Problems:   CHF (congestive heart failure) (HCC)   HTN (hypertension)   ESRD (end stage renal disease) on dialysis [facility])   Long term current use of anticoagulant therapy   PAF (paroxysmal atrial fibrillation)- Amiodarone - NSR   CAD- RCA HSRA/DES 05/15/13-  CFX/OM1 DES 05/19/13- RCA ISR/ PTCA 11/03/14   Severe aortic stenosis   Hypokalemia   HPI:  82 y.o. frail black female.  CRF on dialysis.  CAD with stent to RCA 11/03/14 by Dr CM.  Also with chronically occluded AV groove circumflex.  She has severe AS and has been evaluated by Dr Laneta Simmers Due to poor functional status and porcelin aorta not thought to be an AVR or TAVR candidate.  Talking to daughter she primarily sleeps all day and goes to dialysis. Yesterday had vague weakness and right sided chest/body pain during dialysis ? BP drop.  This am feels fine Eating good breakfast no pain.  Minimal elevation in troponin which is always elevated looking back at her records for the last 2 years.  ECG no acute changes. Overnight telemetry with no arrhythmias.  Currently no dyspnea and euvolemic  EF has been normal by recent echo.  ROS All other systems reviewed and negative except as noted above  Past Medical History  Diagnosis Date  . Anemia 03/06/2011  . Hypertension   . Coronary artery disease     PCI 2014, 2016  . Aortic stenosis, mild     severe aortic stenosis  . A-fib (HCC)   . Acute CHF (HCC)   . Hyperlipidemia   . Myocardial infarction (HCC) 2014  . Heart murmur   . Pneumonia 10/2014  . Anxiety   . ESRD (end stage renal disease) on dialysis (HCC)     "[name]; [facility]; [place]" (11/03/2014)    Family History    Problem Relation Age of Onset  . Heart attack Sister   . Hyperlipidemia Mother   . Stroke Father   . Diabetes Brother     Social History   Social History  . Marital Status: Married    Spouse Name: N/A  . Number of Children: N/A  . Years of Education: N/A   Occupational History  . Not on file.   Social History Main Topics  . Smoking status: Never Smoker   . Smokeless tobacco: Never Used  . Alcohol Use: No  . Drug Use: No  . Sexual Activity: No   Other Topics Concern  . Not on file   Social History Narrative    Past Surgical History  Procedure Laterality Date  . Av fistula placement Left ~ 2012    upper arm arm  . Forearm fracture surgery Bilateral     "fell in the basement"  . Fracture surgery    . Pseudoaneurysm repair Right 05/2013    leg  . Cataract extraction w/phaco Right 10/19/2013    Procedure: CATARACT EXTRACTION PHACO AND INTRAOCULAR LENS PLACEMENT (IOC);  Surgeon: Loraine Leriche T. Nile Riggs, MD;  Location: [facility];  Service: Ophthalmology;  Laterality: Right;  CDE:15.66  . Cataract extraction w/phaco Left 11/02/2013    Procedure: CATARACT EXTRACTION PHACO AND INTRAOCULAR LENS PLACEMENT (IOC);  Surgeon: Loraine Leriche T. Nile Riggs, MD;  Location: [facility];  Service: Ophthalmology;  Laterality: Left;  CDE:8.78  . Left and right heart catheterization with coronary angiogram N/A 05/11/2013    Procedure: LEFT AND RIGHT HEART CATHETERIZATION WITH CORONARY ANGIOGRAM;  Surgeon: Runell Gess, MD;  Location: [facility] CATH LAB;  Service: Cardiovascular;  Laterality: N/A;  . Percutaneous coronary rotoblator intervention (pci-r) N/A 05/14/2013    Procedure: PERCUTANEOUS CORONARY ROTOBLATOR INTERVENTION (PCI-R);  Surgeon: Lennette Bihari, MD;  Location: [facility] CATH LAB;  Service: Cardiovascular;  Laterality: N/A;  . Percutaneous coronary rotoblator intervention (pci-r) N/A 05/19/2013    Procedure: PERCUTANEOUS CORONARY ROTOBLATOR INTERVENTION (PCI-R);  Surgeon: Lennette Bihari, MD;  Location: [facility] CATH LAB;   Service: Cardiovascular;  Laterality: N/A;  . Cardiac catheterization  ; 11/03/2014  . Coronary angioplasty with stent placement    . Cataract extraction w/ intraocular lens  implant, bilateral Bilateral 2015  . Left and right heart catheterization with coronary angiogram N/A 11/03/2014    Procedure: LEFT AND RIGHT HEART CATHETERIZATION WITH CORONARY ANGIOGRAM;  Surgeon: Kathleene Hazel, MD;  Location: [facility] C[facility] LAB;  Service: Cardiovascular;  Laterality: N/A;     . amiodarone  200 mg Oral Daily  . aspirin EC  325 mg Oral Daily  . atorvastatin  40 mg Oral q1800  . doxercalciferol  3.5 mcg Intravenous Q M,W,F-HD  . isosorbide mononitrate  30 mg Oral Daily  . sodium chloride  3 mL Intravenous Q12H  . Warfarin - Pharmacist Dosing Inpatient   Does not apply q1800      Physical Exam: Blood pressure 104/45, pulse 75, temperature 98.8 F (37.1 C), temperature source Oral, resp. rate 20, height  (1.626 m), weight 44.3 kg (97 lb 10.6 oz), SpO2 95 %.   Affect appropriate Frail elderly black female  HEENT: normal Neck supple with no adenopathy JVP normal no bruits no thyromegaly Lungs clear with no wheezing and good diaphragmatic motion Heart:  S1/S2 diminished AS  murmur, no rub, gallop or click PMI normal Abdomen: benighn, BS positve, no tenderness, no AAA no bruit.  No HSM or HJR RUE fistula with thrill  No edema Neuro non-focal Skin warm and dry No muscular weakness   Labs:   Lab Results  Component Value Date   WBC 11.3* 07/31/2015   HGB 11.0* 07/31/2015   HCT 33.8* 07/31/2015   MCV 88.9 07/31/2015   PLT 262 07/31/2015    Recent Labs Lab 08/01/15 0343  NA 138  K 3.3*  CL 98*  CO2 26  BUN 39*  CREATININE 4.13*  CALCIUM 8.3*  GLUCOSE 86   Lab Results  Component Value Date   CKTOTAL 200* 05/07/2013   CKMB 13.5* 05/07/2013   TROPONINI 0.11* 08/01/2015    Lab Results  Component Value Date   CHOL 154 08/09/2014   CHOL 220* 11/10/2012   Lab Results    Component Value Date   HDL 57 08/09/2014   HDL 95 11/10/2012   Lab Results  Component Value Date   LDLCALC 79 08/09/2014   LDLCALC 101* 11/10/2012   Lab Results  Component Value Date   TRIG 90 08/09/2014   TRIG 122 11/10/2012   Lab Results  Component Value Date   CHOLHDL 2.7 08/09/2014   CHOLHDL 2.3 11/10/2012   No results found for: LDLDIRECT    Radiology: Dg Chest 2 View  07/31/2015  CLINICAL DATA:  RIGHT-sided chest pain radiating to the RIGHT upper back. EXAM: CHEST  2 VIEW COMPARISON:  Chest radiograph 12/07/2014 FINDINGS: Stable enlarged cardiac silhouette. No  effusion, infiltrate or pneumothorax. Chronic bronchitic markings are noted. No overt pulmonary edema. IMPRESSION: Cardiomegaly without acute findings. Electronically Signed   By: Genevive Bi [name]   On: 07/31/2015 16:33    EKG: afib rate 104  LVH no acute changes    ASSESSMENT AND PLAN:   Chest Pain:  Resolved ? Related to hemodynamic changes with dialysis and demand ischemia.  Minimal elevation troponin chronic. History of CAD with stent to RCA 10/2014.  No indication for transfer to [facility].  Continue asa nitrates.  Consider adding beta blocker if BP would tolerated during dialysis .  Ok to d/c after dialysis today  AS:  Moderate to severe not a candidate for TAVR has been seen by Dr Laneta Simmers and Rose Fillers  CRF:  Fistula with good thrill Dialysis today at [facility] ? D/c after complete   Afib:  On coumadin and amiodarone INR over 5 being held     Signed: Charlton Haws 08/02/2015, 8:18 AM

## 2015-08-02 NOTE — Procedures (Signed)
   HEMODIALYSIS TREATMENT NOTE:  3 hour heparin-free dialysis completed via left upper arm AVF (16g/antegrade). Goal met: 2 liters removed without interruption in ultrafiltration. All blood was returned and hemostasis was achieved within 15 minutes. Report given to Janace Aris, RN.  Rockwell Alexandria, RN, CDN

## 2015-08-02 NOTE — Care Management Note (Signed)
Case Management Note  Patient Details  Name: [name] MRN: [id number] Date of Birth: July 18, 1927   Expected Discharge Date:     08/02/2015             Expected Discharge Plan:  Home/Self Care  In-House Referral:  NA  Discharge planning Services  CM Consult  Post Acute Care Choice:  Durable Medical Equipment Choice offered to:  Patient  DME Arranged:  Wheelchair manual DME Agency:  [facility]  HH Arranged:    [facility] Agency:     Status of Service:  Completed, signed off  Medicare Important Message Given:    Date Medicare IM Given:    Medicare IM give by:    Date Additional Medicare IM Given:    Additional Medicare Important Message give by:     If discussed at Long Length of Stay Meetings, dates discussed:    Additional Comments: Pt discharging home today with self care after HD. Pt's wheelchair has been ordered and info faxed to [employer]. CM notified [place] that pt would DC today and pt's WC will be delivered to pt's home this afternoon. No further CM needs.   Malcolm Metro, RN 08/02/2015, 10:45 AM

## 2015-08-02 NOTE — Progress Notes (Signed)
[name]  MRN: [id number]  DOB/AGE: [birth year] 82 y.o.  Primary Care Physician:HILL,GERALD K, MD  Admit date: 07/31/2015  Chief Complaint:  Chief Complaint  Patient presents with  . Chest Pain    S-Pt presented on  07/31/2015 with  Chief Complaint  Patient presents with  . Chest Pain  .    Pt today feels better.Pt says my chest pain is gone.Pt daughter is present in the room.     Pt main concern is " when am I going for dialysis?"  Meds . amiodarone  200 mg Oral Daily  . aspirin EC  325 mg Oral Daily  . atorvastatin  40 mg Oral q1800  . doxercalciferol  3.5 mcg Intravenous Q M,W,F-HD  . isosorbide mononitrate  30 mg Oral Daily  . sodium chloride  3 mL Intravenous Q12H  . Warfarin - Pharmacist Dosing Inpatient   Does not apply q1800       Physical Exam: Vital signs in last 24 hours: Temp:  [97.7 F (36.5 C)-98.8 F (37.1 C)] 98.8 F (37.1 C) (01/25 0541) Pulse Rate:  [72-76] 75 (01/25 0541) Resp:  [18-20] 20 (01/25 0541) BP: (103-112)/(44-52) 104/45 mmHg (01/25 0541) SpO2:  [95 %-100 %] 95 % (01/25 0541) Weight:  [97 lb 10.6 oz (44.3 kg)] 97 lb 10.6 oz (44.3 kg) (01/25 0541) Weight change: 1 lb 10.6 oz (0.755 kg)    Intake/Output from previous day: 01/24 0701 - 01/25 0700 In: 720 [P.O.:720] Out: -  Total I/O In: 240 [P.O.:240] Out: -    Physical Exam: General- pt is awake,alert, oriented to time place and person Resp- No acute REsp distress, CTA B/L NO Rhonchi CVS- S1S2 regular ij rate and rhythm GIT- BS+, soft, NT, ND EXT- NO LE Edema, Cyanosis Access- left AVF +  Lab Results: CBC  Recent Labs  07/31/15 1535  WBC 11.3*  HGB 11.0*  HCT 33.8*  PLT 262    BMET  Recent Labs  07/31/15 1535 08/01/15 0343  NA 138 138  K 2.7* 3.3*  CL 97* 98*  CO2 27 26  GLUCOSE 110* 86  BUN 31* 39*  CREATININE 3.28* 4.13*  CALCIUM 8.4* 8.3*    MICRO Recent Results (from the past 240 hour(s))  MRSA PCR Screening     Status: None   Collection Time: 07/31/15 11:15 PM  Result Value Ref Range Status   MRSA by PCR NEGATIVE NEGATIVE Final    Comment:        The GeneXpert MRSA Assay (FDA approved for NASAL specimens only), is one component of a comprehensive MRSA colonization surveillance program. It is not intended to diagnose MRSA infection nor to guide or monitor treatment for MRSA infections.       Lab Results  Component Value Date   PTH 399.0* 02/26/2011   CALCIUM 8.3* 08/01/2015   CAION 1.10* 06/09/2011   PHOS 2.6 10/13/2014               Impression: 1)Renal ESRD on Hd  Pt is on MOn/Wed/ Friday schedule as outpt   2)HTN BP stable Meds On Vasodilators- Hydralzine    3)Anemia - Anemia of chronic disease In ESRD the goal is to keep pt HGb at 9--11. Will keep on low dose epo    4)CKD Mineral-Bone Disorder PTH acceptable Secondary Hyperparathyroidism present  Phosphorus at goal. Calcium when corrected for low albumin is at goal.  5)[facility]- admitted with chest pain Trop elevated  Primary MD following  6)Electrolytes Hypokalemic  Improving  NOrmonatremic    7)Acid base Co2 at goal     Plan:  Will dialyze today Will use low dose epo Will use 4 k bath   BHUTANI,MANPREET S 08/02/2015, 12:00 PM

## 2015-08-03 LAB — HEPATITIS B SURFACE ANTIGEN: Hepatitis B Surface Ag: NEGATIVE

## 2015-08-09 DIAGNOSIS — N186 End stage renal disease: Secondary | ICD-10-CM | POA: Diagnosis not present

## 2015-08-09 DIAGNOSIS — Z992 Dependence on renal dialysis: Secondary | ICD-10-CM | POA: Diagnosis not present

## 2015-08-11 DIAGNOSIS — N186 End stage renal disease: Secondary | ICD-10-CM | POA: Diagnosis not present

## 2015-08-11 DIAGNOSIS — Z992 Dependence on renal dialysis: Secondary | ICD-10-CM | POA: Diagnosis not present

## 2015-08-14 DIAGNOSIS — N186 End stage renal disease: Secondary | ICD-10-CM | POA: Diagnosis not present

## 2015-08-14 DIAGNOSIS — Z992 Dependence on renal dialysis: Secondary | ICD-10-CM | POA: Diagnosis not present

## 2015-08-14 DIAGNOSIS — I2510 Atherosclerotic heart disease of native coronary artery without angina pectoris: Secondary | ICD-10-CM | POA: Diagnosis not present

## 2015-08-16 DIAGNOSIS — Z992 Dependence on renal dialysis: Secondary | ICD-10-CM | POA: Diagnosis not present

## 2015-08-16 DIAGNOSIS — N186 End stage renal disease: Secondary | ICD-10-CM | POA: Diagnosis not present

## 2015-08-18 DIAGNOSIS — Z992 Dependence on renal dialysis: Secondary | ICD-10-CM | POA: Diagnosis not present

## 2015-08-18 DIAGNOSIS — N186 End stage renal disease: Secondary | ICD-10-CM | POA: Diagnosis not present

## 2015-08-19 DIAGNOSIS — N186 End stage renal disease: Secondary | ICD-10-CM | POA: Diagnosis not present

## 2015-08-21 DIAGNOSIS — Z992 Dependence on renal dialysis: Secondary | ICD-10-CM | POA: Diagnosis not present

## 2015-08-21 DIAGNOSIS — N186 End stage renal disease: Secondary | ICD-10-CM | POA: Diagnosis not present

## 2015-08-23 DIAGNOSIS — N186 End stage renal disease: Secondary | ICD-10-CM | POA: Diagnosis not present

## 2015-08-23 DIAGNOSIS — Z992 Dependence on renal dialysis: Secondary | ICD-10-CM | POA: Diagnosis not present

## 2015-08-25 DIAGNOSIS — Z992 Dependence on renal dialysis: Secondary | ICD-10-CM | POA: Diagnosis not present

## 2015-08-25 DIAGNOSIS — N186 End stage renal disease: Secondary | ICD-10-CM | POA: Diagnosis not present

## 2015-08-28 DIAGNOSIS — N186 End stage renal disease: Secondary | ICD-10-CM | POA: Diagnosis not present

## 2015-08-28 DIAGNOSIS — Z992 Dependence on renal dialysis: Secondary | ICD-10-CM | POA: Diagnosis not present

## 2015-08-29 DIAGNOSIS — Z961 Presence of intraocular lens: Secondary | ICD-10-CM | POA: Diagnosis not present

## 2015-08-29 DIAGNOSIS — H40013 Open angle with borderline findings, low risk, bilateral: Secondary | ICD-10-CM | POA: Diagnosis not present

## 2015-08-30 DIAGNOSIS — N186 End stage renal disease: Secondary | ICD-10-CM | POA: Diagnosis not present

## 2015-08-30 DIAGNOSIS — Z992 Dependence on renal dialysis: Secondary | ICD-10-CM | POA: Diagnosis not present

## 2015-09-01 DIAGNOSIS — Z992 Dependence on renal dialysis: Secondary | ICD-10-CM | POA: Diagnosis not present

## 2015-09-01 DIAGNOSIS — N186 End stage renal disease: Secondary | ICD-10-CM | POA: Diagnosis not present

## 2015-09-02 DIAGNOSIS — N186 End stage renal disease: Secondary | ICD-10-CM | POA: Diagnosis not present

## 2015-09-04 DIAGNOSIS — Z992 Dependence on renal dialysis: Secondary | ICD-10-CM | POA: Diagnosis not present

## 2015-09-04 DIAGNOSIS — N186 End stage renal disease: Secondary | ICD-10-CM | POA: Diagnosis not present

## 2015-09-05 DIAGNOSIS — N186 End stage renal disease: Secondary | ICD-10-CM | POA: Diagnosis not present

## 2015-09-05 DIAGNOSIS — Z992 Dependence on renal dialysis: Secondary | ICD-10-CM | POA: Diagnosis not present

## 2015-09-06 DIAGNOSIS — Z992 Dependence on renal dialysis: Secondary | ICD-10-CM | POA: Diagnosis not present

## 2015-09-06 DIAGNOSIS — N186 End stage renal disease: Secondary | ICD-10-CM | POA: Diagnosis not present

## 2015-09-08 DIAGNOSIS — Z992 Dependence on renal dialysis: Secondary | ICD-10-CM | POA: Diagnosis not present

## 2015-09-08 DIAGNOSIS — N186 End stage renal disease: Secondary | ICD-10-CM | POA: Diagnosis not present

## 2015-09-11 ENCOUNTER — Ambulatory Visit (INDEPENDENT_AMBULATORY_CARE_PROVIDER_SITE_OTHER): Payer: Medicare Other | Admitting: *Deleted

## 2015-09-11 ENCOUNTER — Telehealth: Payer: Self-pay | Admitting: *Deleted

## 2015-09-11 DIAGNOSIS — I480 Paroxysmal atrial fibrillation: Secondary | ICD-10-CM | POA: Diagnosis not present

## 2015-09-11 DIAGNOSIS — Z7901 Long term (current) use of anticoagulants: Secondary | ICD-10-CM | POA: Diagnosis not present

## 2015-09-11 DIAGNOSIS — I4891 Unspecified atrial fibrillation: Secondary | ICD-10-CM

## 2015-09-11 DIAGNOSIS — Z5181 Encounter for therapeutic drug level monitoring: Secondary | ICD-10-CM

## 2015-09-11 DIAGNOSIS — Z992 Dependence on renal dialysis: Secondary | ICD-10-CM | POA: Diagnosis not present

## 2015-09-11 DIAGNOSIS — N186 End stage renal disease: Secondary | ICD-10-CM | POA: Diagnosis not present

## 2015-09-11 LAB — POCT INR: INR: 2.2

## 2015-09-11 NOTE — Telephone Encounter (Signed)
Pts daughter called to inform me that pt is going to take Cipro 250mg s QD for 5 more days, she just finished a 7 day course on Saturday of this same drug. INR today WNL. Instructed them to call if dose is increased or frequency and or new med added.

## 2015-09-13 DIAGNOSIS — N186 End stage renal disease: Secondary | ICD-10-CM | POA: Diagnosis not present

## 2015-09-13 DIAGNOSIS — Z992 Dependence on renal dialysis: Secondary | ICD-10-CM | POA: Diagnosis not present

## 2015-09-15 DIAGNOSIS — N186 End stage renal disease: Secondary | ICD-10-CM | POA: Diagnosis not present

## 2015-09-15 DIAGNOSIS — Z992 Dependence on renal dialysis: Secondary | ICD-10-CM | POA: Diagnosis not present

## 2015-09-16 DIAGNOSIS — N186 End stage renal disease: Secondary | ICD-10-CM | POA: Diagnosis not present

## 2015-09-18 DIAGNOSIS — Z992 Dependence on renal dialysis: Secondary | ICD-10-CM | POA: Diagnosis not present

## 2015-09-18 DIAGNOSIS — N186 End stage renal disease: Secondary | ICD-10-CM | POA: Diagnosis not present

## 2015-09-18 DIAGNOSIS — Z7689 Persons encountering health services in other specified circumstances: Secondary | ICD-10-CM | POA: Diagnosis not present

## 2015-09-20 DIAGNOSIS — Z7689 Persons encountering health services in other specified circumstances: Secondary | ICD-10-CM | POA: Diagnosis not present

## 2015-09-20 DIAGNOSIS — Z992 Dependence on renal dialysis: Secondary | ICD-10-CM | POA: Diagnosis not present

## 2015-09-20 DIAGNOSIS — N186 End stage renal disease: Secondary | ICD-10-CM | POA: Diagnosis not present

## 2015-09-22 DIAGNOSIS — N186 End stage renal disease: Secondary | ICD-10-CM | POA: Diagnosis not present

## 2015-09-22 DIAGNOSIS — Z7689 Persons encountering health services in other specified circumstances: Secondary | ICD-10-CM | POA: Diagnosis not present

## 2015-09-22 DIAGNOSIS — Z992 Dependence on renal dialysis: Secondary | ICD-10-CM | POA: Diagnosis not present

## 2015-09-25 DIAGNOSIS — Z992 Dependence on renal dialysis: Secondary | ICD-10-CM | POA: Diagnosis not present

## 2015-09-25 DIAGNOSIS — Z7689 Persons encountering health services in other specified circumstances: Secondary | ICD-10-CM | POA: Diagnosis not present

## 2015-09-25 DIAGNOSIS — N186 End stage renal disease: Secondary | ICD-10-CM | POA: Diagnosis not present

## 2015-09-27 DIAGNOSIS — Z7689 Persons encountering health services in other specified circumstances: Secondary | ICD-10-CM | POA: Diagnosis not present

## 2015-09-27 DIAGNOSIS — Z992 Dependence on renal dialysis: Secondary | ICD-10-CM | POA: Diagnosis not present

## 2015-09-27 DIAGNOSIS — N186 End stage renal disease: Secondary | ICD-10-CM | POA: Diagnosis not present

## 2015-09-28 ENCOUNTER — Other Ambulatory Visit: Payer: Self-pay | Admitting: *Deleted

## 2015-09-28 MED ORDER — WARFARIN SODIUM 1 MG PO TABS: ORAL_TABLET | ORAL | Status: AC

## 2015-09-28 NOTE — Telephone Encounter (Signed)
Spoke with dtr, Dondra SpryGail & she stated that she has been calling since last week to obtain a refill on her mother's medication and is unsure the reaon no one has responded.  I advised that I sent in a refill today for the warfarin/couamdin prior to calling her, also advised to pick it up today since the pt is out of the medication.  She stated she was going to call at this time. Also, I called the pharmacy & spoke with Harrold Donathathan and he stated he would prepare the medication.

## 2015-09-28 NOTE — Telephone Encounter (Signed)
Pt daughter is saying the pharmacy told them that Ms. Maisie Fushomas has no refills left.  Please call the Dondra SpryGail (the patient's daughter) to confirm that the refill has been sent in.   *STAT* If patient is at the pharmacy, call can be transferred to refill team.   1. Which medications need to be refilled? (please list name of each medication and dose if known) * Coumadin  2. Which pharmacy/location (including street and city if local pharmacy) is medication to be sent to?*[facility]  3. Do they need a 30 day or 90 day supply? 30

## 2015-09-29 DIAGNOSIS — N186 End stage renal disease: Secondary | ICD-10-CM | POA: Diagnosis not present

## 2015-09-29 DIAGNOSIS — Z7689 Persons encountering health services in other specified circumstances: Secondary | ICD-10-CM | POA: Diagnosis not present

## 2015-09-29 DIAGNOSIS — Z992 Dependence on renal dialysis: Secondary | ICD-10-CM | POA: Diagnosis not present

## 2015-09-30 DIAGNOSIS — N186 End stage renal disease: Secondary | ICD-10-CM | POA: Diagnosis not present

## 2015-10-02 DIAGNOSIS — N186 End stage renal disease: Secondary | ICD-10-CM | POA: Diagnosis not present

## 2015-10-02 DIAGNOSIS — Z992 Dependence on renal dialysis: Secondary | ICD-10-CM | POA: Diagnosis not present

## 2015-10-02 DIAGNOSIS — Z7689 Persons encountering health services in other specified circumstances: Secondary | ICD-10-CM | POA: Diagnosis not present

## 2015-10-04 DIAGNOSIS — Z992 Dependence on renal dialysis: Secondary | ICD-10-CM | POA: Diagnosis not present

## 2015-10-04 DIAGNOSIS — Z7689 Persons encountering health services in other specified circumstances: Secondary | ICD-10-CM | POA: Diagnosis not present

## 2015-10-04 DIAGNOSIS — N186 End stage renal disease: Secondary | ICD-10-CM | POA: Diagnosis not present

## 2015-10-06 DIAGNOSIS — Z7689 Persons encountering health services in other specified circumstances: Secondary | ICD-10-CM | POA: Diagnosis not present

## 2015-10-06 DIAGNOSIS — Z992 Dependence on renal dialysis: Secondary | ICD-10-CM | POA: Diagnosis not present

## 2015-10-06 DIAGNOSIS — N186 End stage renal disease: Secondary | ICD-10-CM | POA: Diagnosis not present

## 2015-10-07 ENCOUNTER — Observation Stay (HOSPITAL_COMMUNITY): Payer: Medicare Other

## 2015-10-07 ENCOUNTER — Inpatient Hospital Stay (HOSPITAL_COMMUNITY)
Admission: EM | Admit: 2015-10-07 | Discharge: 2015-10-10 | DRG: 073 | Disposition: A | Discharge status: Home / Self Care | Payer: Medicare Other | Attending: Internal Medicine | Admitting: Internal Medicine

## 2015-10-07 ENCOUNTER — Emergency Department (HOSPITAL_COMMUNITY): Payer: Medicare Other

## 2015-10-07 ENCOUNTER — Encounter (HOSPITAL_COMMUNITY): Payer: Self-pay | Admitting: *Deleted

## 2015-10-07 DIAGNOSIS — I5032 Chronic diastolic (congestive) heart failure: Secondary | ICD-10-CM | POA: Diagnosis not present

## 2015-10-07 DIAGNOSIS — I998 Other disorder of circulatory system: Secondary | ICD-10-CM | POA: Diagnosis present

## 2015-10-07 DIAGNOSIS — Z9841 Cataract extraction status, right eye: Secondary | ICD-10-CM

## 2015-10-07 DIAGNOSIS — R791 Abnormal coagulation profile: Secondary | ICD-10-CM

## 2015-10-07 DIAGNOSIS — I482 Chronic atrial fibrillation: Secondary | ICD-10-CM | POA: Diagnosis present

## 2015-10-07 DIAGNOSIS — Z9842 Cataract extraction status, left eye: Secondary | ICD-10-CM

## 2015-10-07 DIAGNOSIS — Z992 Dependence on renal dialysis: Secondary | ICD-10-CM | POA: Diagnosis not present

## 2015-10-07 DIAGNOSIS — D631 Anemia in chronic kidney disease: Secondary | ICD-10-CM | POA: Diagnosis not present

## 2015-10-07 DIAGNOSIS — N186 End stage renal disease: Secondary | ICD-10-CM | POA: Diagnosis present

## 2015-10-07 DIAGNOSIS — Z955 Presence of coronary angioplasty implant and graft: Secondary | ICD-10-CM

## 2015-10-07 DIAGNOSIS — I70201 Unspecified atherosclerosis of native arteries of extremities, right leg: Secondary | ICD-10-CM | POA: Diagnosis not present

## 2015-10-07 DIAGNOSIS — I480 Paroxysmal atrial fibrillation: Secondary | ICD-10-CM | POA: Diagnosis not present

## 2015-10-07 DIAGNOSIS — R279 Unspecified lack of coordination: Secondary | ICD-10-CM | POA: Diagnosis not present

## 2015-10-07 DIAGNOSIS — E876 Hypokalemia: Secondary | ICD-10-CM | POA: Diagnosis not present

## 2015-10-07 DIAGNOSIS — Z961 Presence of intraocular lens: Secondary | ICD-10-CM | POA: Diagnosis not present

## 2015-10-07 DIAGNOSIS — D649 Anemia, unspecified: Secondary | ICD-10-CM | POA: Diagnosis present

## 2015-10-07 DIAGNOSIS — G629 Polyneuropathy, unspecified: Secondary | ICD-10-CM | POA: Diagnosis not present

## 2015-10-07 DIAGNOSIS — Z66 Do not resuscitate: Secondary | ICD-10-CM | POA: Diagnosis present

## 2015-10-07 DIAGNOSIS — J811 Chronic pulmonary edema: Secondary | ICD-10-CM | POA: Diagnosis not present

## 2015-10-07 DIAGNOSIS — R209 Unspecified disturbances of skin sensation: Secondary | ICD-10-CM

## 2015-10-07 DIAGNOSIS — I739 Peripheral vascular disease, unspecified: Secondary | ICD-10-CM

## 2015-10-07 DIAGNOSIS — M7989 Other specified soft tissue disorders: Secondary | ICD-10-CM | POA: Diagnosis not present

## 2015-10-07 DIAGNOSIS — Z7901 Long term (current) use of anticoagulants: Secondary | ICD-10-CM

## 2015-10-07 DIAGNOSIS — Z01818 Encounter for other preprocedural examination: Secondary | ICD-10-CM | POA: Diagnosis not present

## 2015-10-07 DIAGNOSIS — E785 Hyperlipidemia, unspecified: Secondary | ICD-10-CM | POA: Diagnosis not present

## 2015-10-07 DIAGNOSIS — I959 Hypotension, unspecified: Secondary | ICD-10-CM | POA: Diagnosis not present

## 2015-10-07 DIAGNOSIS — I2510 Atherosclerotic heart disease of native coronary artery without angina pectoris: Secondary | ICD-10-CM | POA: Diagnosis present

## 2015-10-07 DIAGNOSIS — I350 Nonrheumatic aortic (valve) stenosis: Secondary | ICD-10-CM | POA: Diagnosis not present

## 2015-10-07 DIAGNOSIS — I132 Hypertensive heart and chronic kidney disease with heart failure and with stage 5 chronic kidney disease, or end stage renal disease: Secondary | ICD-10-CM | POA: Diagnosis present

## 2015-10-07 DIAGNOSIS — Z743 Need for continuous supervision: Secondary | ICD-10-CM | POA: Diagnosis not present

## 2015-10-07 DIAGNOSIS — R208 Other disturbances of skin sensation: Secondary | ICD-10-CM | POA: Diagnosis not present

## 2015-10-07 DIAGNOSIS — M79674 Pain in right toe(s): Secondary | ICD-10-CM | POA: Diagnosis not present

## 2015-10-07 DIAGNOSIS — M79671 Pain in right foot: Secondary | ICD-10-CM | POA: Diagnosis not present

## 2015-10-07 DIAGNOSIS — I999 Unspecified disorder of circulatory system: Secondary | ICD-10-CM

## 2015-10-07 DIAGNOSIS — Z01811 Encounter for preprocedural respiratory examination: Secondary | ICD-10-CM

## 2015-10-07 DIAGNOSIS — I252 Old myocardial infarction: Secondary | ICD-10-CM | POA: Diagnosis not present

## 2015-10-07 HISTORY — DX: Aneurysm of unspecified site: I72.9

## 2015-10-07 HISTORY — DX: Complication of other artery following a procedure, not elsewhere classified, initial encounter: T81.718A

## 2015-10-07 LAB — CBC WITH DIFFERENTIAL/PLATELET
BASOS ABS: 0 10*3/uL (ref 0.0–0.1)
Basophils Relative: 1 %
EOS ABS: 0.6 10*3/uL (ref 0.0–0.7)
EOS PCT: 9 %
HCT: 37.1 % (ref 36.0–46.0)
Hemoglobin: 12 g/dL (ref 12.0–15.0)
LYMPHS PCT: 27 %
Lymphs Abs: 1.7 10*3/uL (ref 0.7–4.0)
MCH: 29.6 pg (ref 26.0–34.0)
MCHC: 32.3 g/dL (ref 30.0–36.0)
MCV: 91.6 fL (ref 78.0–100.0)
Monocytes Absolute: 0.6 10*3/uL (ref 0.1–1.0)
Monocytes Relative: 10 %
Neutro Abs: 3.3 10*3/uL (ref 1.7–7.7)
Neutrophils Relative %: 53 %
PLATELETS: 208 10*3/uL (ref 150–400)
RBC: 4.05 MIL/uL (ref 3.87–5.11)
RDW: 17.2 % — ABNORMAL HIGH (ref 11.5–15.5)
WBC: 6.2 10*3/uL (ref 4.0–10.5)

## 2015-10-07 LAB — COMPREHENSIVE METABOLIC PANEL
ALK PHOS: 63 U/L (ref 38–126)
ALT: 7 U/L — AB (ref 14–54)
ANION GAP: 11 (ref 5–15)
AST: 14 U/L — ABNORMAL LOW (ref 15–41)
Albumin: 3 g/dL — ABNORMAL LOW (ref 3.5–5.0)
BILIRUBIN TOTAL: 0.9 mg/dL (ref 0.3–1.2)
BUN: 39 mg/dL — ABNORMAL HIGH (ref 6–20)
CALCIUM: 7.6 mg/dL — AB (ref 8.9–10.3)
CO2: 27 mmol/L (ref 22–32)
CREATININE: 3.63 mg/dL — AB (ref 0.44–1.00)
Chloride: 101 mmol/L (ref 101–111)
GFR, EST AFRICAN AMERICAN: 12 mL/min — AB (ref >60)
GFR, EST NON AFRICAN AMERICAN: 11 mL/min — AB (ref >60)
Glucose, Bld: 112 mg/dL — ABNORMAL HIGH (ref 65–99)
Potassium: 2.9 mmol/L — ABNORMAL LOW (ref 3.5–5.1)
SODIUM: 139 mmol/L (ref 135–145)
TOTAL PROTEIN: 6.3 g/dL — AB (ref 6.5–8.1)

## 2015-10-07 LAB — PROTIME-INR
INR: 1.68 — AB (ref 0.00–1.49)
PROTHROMBIN TIME: 19.8 s — AB (ref 11.6–15.2)

## 2015-10-07 MED ORDER — ATORVASTATIN CALCIUM 40 MG PO TABS
40.0000 mg | ORAL_TABLET | Freq: Every day | ORAL | Status: DC
  Administered 2015-10-08 – 2015-10-09 (×2): 40 mg via ORAL
  Filled 2015-10-07 (×2): qty 1

## 2015-10-07 MED ORDER — POTASSIUM CHLORIDE CRYS ER 20 MEQ PO TBCR
EXTENDED_RELEASE_TABLET | ORAL | Status: AC
  Filled 2015-10-07: qty 2

## 2015-10-07 MED ORDER — DEXTROSE 5 % IV SOLN
2.2500 g | Freq: Four times a day (QID) | INTRAVENOUS | Status: DC
  Filled 2015-10-07 (×8): qty 2.25

## 2015-10-07 MED ORDER — SODIUM CHLORIDE 0.9 % IV SOLN
INTRAVENOUS | Status: DC
  Administered 2015-10-07: 17:00:00 via INTRAVENOUS

## 2015-10-07 MED ORDER — SODIUM CHLORIDE 0.9 % IV SOLN: 250.0000 mL | INTRAVENOUS | Status: DC | PRN

## 2015-10-07 MED ORDER — MORPHINE SULFATE (PF) 2 MG/ML IV SOLN
2.0000 mg | INTRAVENOUS | Status: AC | PRN
  Administered 2015-10-07 (×2): 2 mg via INTRAVENOUS
  Filled 2015-10-07 (×2): qty 1

## 2015-10-07 MED ORDER — ALBUTEROL SULFATE (2.5 MG/3ML) 0.083% IN NEBU: 2.5000 mg | INHALATION_SOLUTION | RESPIRATORY_TRACT | Status: DC | PRN

## 2015-10-07 MED ORDER — ACETAMINOPHEN 325 MG PO TABS
650.0000 mg | ORAL_TABLET | Freq: Four times a day (QID) | ORAL | Status: DC | PRN
  Administered 2015-10-08: 650 mg via ORAL
  Filled 2015-10-07: qty 2

## 2015-10-07 MED ORDER — SODIUM CHLORIDE 0.9% FLUSH: 3.0000 mL | INTRAVENOUS | Status: DC | PRN

## 2015-10-07 MED ORDER — MORPHINE SULFATE (PF) 2 MG/ML IV SOLN: 1.0000 mg | INTRAVENOUS | Status: DC | PRN

## 2015-10-07 MED ORDER — NITROGLYCERIN 0.4 MG SL SUBL: 0.4000 mg | SUBLINGUAL_TABLET | SUBLINGUAL | Status: DC | PRN

## 2015-10-07 MED ORDER — PIPERACILLIN SOD-TAZOBACTAM SO 2.25 (2-0.25) G IV SOLR
2.2500 g | Freq: Three times a day (TID) | INTRAVENOUS | Status: DC
  Administered 2015-10-08 – 2015-10-10 (×7): 2.25 g via INTRAVENOUS
  Filled 2015-10-07 (×15): qty 2.25

## 2015-10-07 MED ORDER — HYDROMORPHONE HCL 1 MG/ML IJ SOLN: 0.2500 mg | INTRAMUSCULAR | Status: AC | PRN

## 2015-10-07 MED ORDER — HEPARIN (PORCINE) IN NACL 100-0.45 UNIT/ML-% IJ SOLN
700.0000 [IU]/h | INTRAMUSCULAR | Status: DC
  Administered 2015-10-07: 600 [IU]/h via INTRAVENOUS
  Administered 2015-10-09: 700 [IU]/h via INTRAVENOUS
  Filled 2015-10-07 (×2): qty 250

## 2015-10-07 MED ORDER — VANCOMYCIN HCL IN DEXTROSE 1-5 GM/200ML-% IV SOLN
1000.0000 mg | Freq: Once | INTRAVENOUS | Status: AC
  Administered 2015-10-07: 1000 mg via INTRAVENOUS
  Filled 2015-10-07: qty 200

## 2015-10-07 MED ORDER — ONDANSETRON HCL 4 MG PO TABS: 4.0000 mg | ORAL_TABLET | Freq: Four times a day (QID) | ORAL | Status: DC | PRN

## 2015-10-07 MED ORDER — AMIODARONE HCL 200 MG PO TABS
200.0000 mg | ORAL_TABLET | Freq: Every day | ORAL | Status: DC
  Administered 2015-10-08 – 2015-10-10 (×3): 200 mg via ORAL
  Filled 2015-10-07 (×3): qty 1

## 2015-10-07 MED ORDER — POTASSIUM CHLORIDE CRYS ER 20 MEQ PO TBCR
30.0000 meq | EXTENDED_RELEASE_TABLET | Freq: Once | ORAL | Status: AC
  Administered 2015-10-07: 30 meq via ORAL

## 2015-10-07 MED ORDER — ACETAMINOPHEN 650 MG RE SUPP: 650.0000 mg | Freq: Four times a day (QID) | RECTAL | Status: DC | PRN

## 2015-10-07 MED ORDER — PIPERACILLIN-TAZOBACTAM 3.375 G IVPB
INTRAVENOUS | Status: AC
  Filled 2015-10-07: qty 50

## 2015-10-07 MED ORDER — SODIUM CHLORIDE 0.9 % IV SOLN
INTRAVENOUS | Status: AC
  Administered 2015-10-07: 20:00:00 via INTRAVENOUS

## 2015-10-07 MED ORDER — SODIUM CHLORIDE 0.9% FLUSH
3.0000 mL | Freq: Two times a day (BID) | INTRAVENOUS | Status: DC
  Administered 2015-10-07 – 2015-10-09 (×3): 3 mL via INTRAVENOUS

## 2015-10-07 MED ORDER — ONDANSETRON HCL 4 MG/2ML IJ SOLN: 4.0000 mg | Freq: Four times a day (QID) | INTRAMUSCULAR | Status: DC | PRN

## 2015-10-07 MED ORDER — DOCUSATE SODIUM 100 MG PO CAPS
100.0000 mg | ORAL_CAPSULE | Freq: Two times a day (BID) | ORAL | Status: DC
  Administered 2015-10-07 – 2015-10-10 (×5): 100 mg via ORAL
  Filled 2015-10-07 (×6): qty 1

## 2015-10-07 MED ORDER — OXYCODONE HCL 5 MG PO TABS
5.0000 mg | ORAL_TABLET | ORAL | Status: DC | PRN
  Administered 2015-10-08 – 2015-10-10 (×6): 5 mg via ORAL
  Filled 2015-10-07 (×5): qty 1

## 2015-10-07 MED ORDER — SODIUM CHLORIDE 0.9% FLUSH
3.0000 mL | Freq: Two times a day (BID) | INTRAVENOUS | Status: DC
  Administered 2015-10-07: 3 mL via INTRAVENOUS

## 2015-10-07 MED ORDER — ACETAMINOPHEN-CODEINE #3 300-30 MG PO TABS: 1.0000 | ORAL_TABLET | Freq: Four times a day (QID) | ORAL | Status: DC | PRN

## 2015-10-07 NOTE — H&P (Addendum)
[facility] History and Physical  [name] ONG:[id number] DOB: 01/22/1928 DOA: 10/07/2015  Referring physician: Dr Richrd PrimeMcMannus PCP: Evlyn CourierGerald K Hill, MD   Chief Complaint: Right foot pain.   HPI: [name] is a 82 y.o. female with Multiple Medical problems, she was at some point under palliative care per daughter, patient has history of severe aortic stenosis, HTN, CAD, ESRD on HD [facility], [facility], [facility] [place]. Patient presents with worsening of right foot pain, unable to walk due to pain. She also notice worsening redness of right foot. The foot pain started 4 weeks ago, she thought it was gout. The pain has been progressively getting worse. She denies pain left foot.    Review of Systems:  Negative, except as per HPI.   Past Medical History  Diagnosis Date  . Anemia 03/06/2011  . Hypertension   . Coronary artery disease     PCI 2014, 2016  . Aortic stenosis, mild     severe aortic stenosis  . A-fib (HCC)   . Acute CHF (HCC)   . Hyperlipidemia   . Myocardial infarction (HCC) 2014  . Heart murmur   . Pneumonia 10/2014  . Anxiety   . ESRD (end stage renal disease) on dialysis (HCC)     "MWF; [facility]; [place]" (11/03/2014)  . Pseudoaneurysm following procedure (HCC) 05/2013    s/p cardiac cath; closed with Vasc US compression   Past Surgical History  Procedure Laterality Date  . Av fistula placement Left ~ 2012    upper arm arm  . Forearm fracture surgery Bilateral     "fell in the basement"  . Fracture surgery    . Pseudoaneurysm repair Right 05/2013    leg  . Cataract extraction w/phaco Right 10/19/2013    Procedure: CATARACT EXTRACTION PHACO AND INTRAOCULAR LENS PLACEMENT (IOC);  Surgeon: Loraine LericheMark T. Nile RiggsShapiro, MD;  Location: [facility];  Service: Ophthalmology;  Laterality: Right;  CDE:15.66  . Cataract extraction w/phaco Left 11/02/2013    Procedure: CATARACT EXTRACTION PHACO AND INTRAOCULAR LENS PLACEMENT (IOC);  Surgeon: Loraine LericheMark T. Nile RiggsShapiro, MD;  Location: [facility];  Service:  Ophthalmology;  Laterality: Left;  CDE:8.78  . Left and right heart catheterization with coronary angiogram N/A 05/11/2013    Procedure: LEFT AND RIGHT HEART CATHETERIZATION WITH CORONARY ANGIOGRAM;  Surgeon: Runell GessJonathan J Berry, MD;  [facility];  Service: Cardiovascular;  Laterality: N/A;  . Percutaneous coronary rotoblator intervention (pci-r) N/A 05/14/2013    Procedure: PERCUTANEOUS CORONARY ROTOBLATOR INTERVENTION (PCI-R);  Surgeon: Lennette Biharihomas A Kelly, MD;  [facility];  Service: Cardiovascular;  Laterality: N/A;  . Percutaneous coronary rotoblator intervention (pci-r) N/A 05/19/2013    Procedure: PERCUTANEOUS CORONARY ROTOBLATOR INTERVENTION (PCI-R);  Surgeon: Lennette Biharihomas A Kelly, MD;  [facility];  Service: Cardiovascular;  Laterality: N/A;  . Cardiac catheterization  ; 11/03/2014  . Coronary angioplasty with stent placement    . Cataract extraction w/ intraocular lens  implant, bilateral Bilateral 2015  . Left and right heart catheterization with coronary angiogram N/A 11/03/2014    Procedure: LEFT AND RIGHT HEART CATHETERIZATION WITH CORONARY ANGIOGRAM;  Surgeon: Kathleene Hazelhristopher D McAlhany, MD;  [facility];  Service: Cardiovascular;  Laterality: N/A;   Social History:  reports that she has never smoked. She has never used smokeless tobacco. She reports that she does not drink alcohol or use illicit drugs.  Allergies  Allergen Reactions  . Sulfa Antibiotics     Family History  Problem Relation Age of Onset  .  Heart attack Sister   . Hyperlipidemia Mother   . Stroke Father   . Diabetes Brother     Prior to Admission medications   Medication Sig Start Date End Date Taking? Authorizing Provider  acetaminophen-codeine (TYLENOL #3) 300-30 MG tablet Take 1 tablet by mouth every 6 (six) hours as needed for moderate pain.   Yes Historical Provider, MD  albuterol (PROVENTIL HFA;VENTOLIN HFA) 108 (90 BASE) MCG/ACT inhaler Inhale 2 puffs into the lungs every 4 (four)  hours as needed for wheezing or shortness of breath. 11/20/14  Yes Margarita Grizzle, MD  amiodarone (PACERONE) 200 MG tablet TAKE ONE TABLET BY MOUTH DAILY. 04/26/15  Yes Kathleene Hazel, MD  darbepoetin (ARANESP) 100 MCG/0.5ML SOLN injection Inject 100 mcg into the skin See admin instructions. Takes every Monday, Wednesday and Friday with dialysis   Yes Historical Provider, MD  doxercalciferol (HECTOROL) 4 MCG/2ML injection Inject 3.5 mcg into the vein every Monday, Wednesday, and Friday with hemodialysis.   Yes Historical Provider, MD  HYDROcodone-acetaminophen (NORCO) 5-325 MG tablet Take 1 tablet by mouth 2 (two) times daily as needed for moderate pain.   Yes Historical Provider, MD  nitroGLYCERIN (NITROSTAT) 0.4 MG SL tablet Place 1 tablet (0.4 mg total) under the tongue every 5 (five) minutes as needed for chest pain. 11/04/14  Yes Abelino Derrick, PA-C  warfarin (COUMADIN) 1 MG tablet Take 1 1/2 tablets daily except 2 tablets on Mondays, Wednesdays and Fridays 09/28/15  Yes Chrystie Nose, MD  atorvastatin (LIPITOR) 40 MG tablet Take 1 tablet (40 mg total) by mouth daily at 6 PM. Patient not taking: Reported on 10/07/2015 08/02/15   Henderson Cloud, MD  benzonatate (TESSALON) 100 MG capsule Take 1 capsule (100 mg total) by mouth 3 (three) times daily as needed for cough. Patient not taking: Reported on 10/07/2015 08/02/15   Henderson Cloud, MD  isosorbide mononitrate (IMDUR) 30 MG 24 hr tablet Take 1 tablet (30 mg total) by mouth daily. Patient not taking: Reported on 10/07/2015 08/02/15   Henderson Cloud, MD   Physical Exam: Filed Vitals:   10/07/15 1202 10/07/15 1500  BP: 114/51 116/51  Pulse: 72 74  Temp: 97.6 F (36.4 C) 98 F (36.7 C)  TempSrc: Oral   Resp: 15 16  Height:  (1.626 m)   Weight: 44.453 kg (98 lb)   SpO2: 100% 97%    Wt Readings from Last 3 Encounters:  10/07/15 44.453 kg (98 lb)  08/02/15 44.6 kg (98 lb 5.2 oz)  12/07/14 51.71 kg (114  lb)    General:  Appears calm and comfortable, cachetic Eyes: PERRL, normal lids, irises & conjunctiva ENT: grossly normal hearing, lips & tongue Neck: no LAD, masses or thyromegaly Cardiovascular: RRR, systolic murmur throughout the chest . No LE edema. Respiratory: CTA bilaterally, no w/r/r. Normal respiratory effort. Abdomen: soft, ntnd Skin: redness right foot, ecchymosis. Also mild redness left foot/ unable to palpate pulse right foot. Doppler perform by EDP +PT (biphasic), +AP (monophasic), no DP. Musculoskeletal: grossly normal tone BUE/BLE Psychiatric: grossly normal mood and affect, speech fluent and appropriate Neurologic: grossly non-focal.          Labs on Admission:  Basic Metabolic Panel:  Recent Labs Lab 10/07/15 1227  NA 139  K 2.9*  CL 101  CO2 27  GLUCOSE 112*  BUN 39*  CREATININE 3.63*  CALCIUM 7.6*   Liver Function Tests:  Recent Labs Lab 10/07/15 1227  AST 14*  ALT 7*  ALKPHOS 63  BILITOT 0.9  PROT 6.3*  ALBUMIN 3.0*   No results for input(s): LIPASE, AMYLASE in the last 168 hours. No results for input(s): AMMONIA in the last 168 hours. CBC:  Recent Labs Lab 10/07/15 1227  WBC 6.2  NEUTROABS 3.3  HGB 12.0  HCT 37.1  MCV 91.6  PLT 208   Cardiac Enzymes: No results for input(s): CKTOTAL, CKMB, CKMBINDEX, TROPONINI in the last 168 hours.  BNP (last 3 results)  Recent Labs  10/12/14 2345 11/20/14 1019 07/31/15 1535  BNP >4500.0* >4500.0* >4500.0*    ProBNP (last 3 results) No results for input(s): PROBNP in the last 8760 hours.  CBG: No results for input(s): GLUCAP in the last 168 hours.  Radiological Exams on Admission: Dg Foot Complete Right  10/07/2015  CLINICAL DATA:  Pain between the first and second toes for 2 weeks. Renal failure, dialysis dependent. EXAM: RIGHT FOOT COMPLETE - 3+ VIEW COMPARISON:  12/07/2014 FINDINGS: Diffuse osteopenia evident. Chronic irregularity and bony deformity of the right fifth MTP joint  and the right fifth proximal phalanx. Peripheral atherosclerosis evident. No acute osseous finding or fracture. Minor dorsal soft tissue swelling on the lateral view. IMPRESSION: Osteopenia and chronic deformity of the right fifth MTP joint and fifth proximal phalanx. Mild dorsal soft tissue swelling. No acute osseous finding by plain radiography Peripheral atherosclerosis Electronically Signed   By: Judie Petit.  Shick [name]   On: 10/07/2015 12:32    EKG: will order EKG.   Assessment/Plan Principal Problem:   Ischemic foot Active Problems:   Anemia   ESRD (end stage renal disease) on dialysis (HCC)   PAF (paroxysmal atrial fibrillation)- Amiodarone - NSR   Severe aortic stenosis   Right foot pain  1-Right foot pain, doppler no PD. Concern for ischemic foot.  ED physician discussed case with vascular surgeon. Recommendation was to transfer patient to [facility].  Started patient on heparin Gtt.  I have added Vancomycin and Zosyn to cover for infection.  Further evaluation per vascular./  Will check chest x ray and EKG.  Ordered place for nurses to call Vascular when patient arrives to [place].   2-ESRD; I have inform renal of patient transfer. They will see patient in consultation 4-02.  No significant electrolytes abnormalities. No pulmonary edema on exam.   3-CAD, Aortic stenosis, severe,  History of CAD with stent to RCA 10/2014 Continue with PRN nitroglycerin.  Check routine EKG.   4-Afib. Continue with amiodarone. IV heparin. Resume coumadin when ok by vascular.   5-hypokalemia; will give one time dose Kcl.  6-Chronic Diastolic HF, stable.   Code Status:  DVT Prophylaxis:on heparin.  Family Communication: daughter at bedside.  Disposition Plan: expect more than 2 to 3 days inpatient.   Time spent: 75 minutes.   Hartley Barefoot A [facility] Pager [phone]

## 2015-10-07 NOTE — ED Notes (Signed)
Report given to The Procter & GambleMike RN on [facility]

## 2015-10-07 NOTE — ED Notes (Signed)
Called [facility] with bed asignment. [phone][facility][id number] [phone][facility]

## 2015-10-07 NOTE — Progress Notes (Signed)
ANTICOAGULATION CONSULT NOTE - Follow Up Consult  Pharmacy Consult for heparin Indication: no DP pulse R foot  Allergies  Allergen Reactions  . Sulfa Antibiotics     Patient Measurements: Height: 5\' 4"  (162.6 cm) Weight: 98 lb (44.453 kg) IBW/kg (Calculated) : 54.7 Heparin Dosing Weight: 44.5 kg  Vital Signs: Temp: 97.6 F (36.4 C) (04/01 1202) Temp Source: Oral (04/01 1202) BP: 114/51 mmHg (04/01 1202) Pulse Rate: 72 (04/01 1202)  Labs:  Recent Labs  10/07/15 1227  HGB 12.0  HCT 37.1  PLT 208  LABPROT 19.8*  INR 1.68*  CREATININE 3.63*    Estimated Creatinine Clearance: 8 mL/min (by C-G formula based on Cr of 3.63).   Medications:  Coumadin PTA  Assessment: 82 yo lady to start heparin for PVD.  She is on coumadin for afib.  INR is 1.68 Goal of Therapy:  Heparin level 0.3-0.7 units/ml Monitor platelets by anticoagulation protocol: Yes   Plan:  Start heparin drip no bolus at 600 units/hr Check heparin level 8 hours after start and daily while on heparin Monitor for bleeding complications  Thanks for allowing pharmacy to be a part of this patient's care.  Talbert CageLora Kylei Purington, PharmD Clinical Pharmacist   Mazi Schuff, Mervyn GayLora Poteet 10/07/2015,2:47 PM

## 2015-10-07 NOTE — ED Provider Notes (Signed)
CSN: [id number]     Arrival date & time 10/07/15  1151 History   First MD Initiated Contact with Patient 10/07/15 1341     Chief Complaint  Patient presents with  . Foot Pain     HPI  Pt was seen at 1350. Per pt and her family, c/o gradual onset and worsening of persistent right foot "pain" for the past 2 to 3 weeks, worse over the past 3 days. Pt states her PMD called in rx prednisone and hydrocodone for presumed gout attack. Pt has been taking the meds without improvement. Denies injury, no focal motor weakness, no fevers.    Past Medical History  Diagnosis Date  . Anemia 03/06/2011  . Hypertension   . Coronary artery disease     PCI 2014, 2016  . Aortic stenosis, mild     severe aortic stenosis  . A-fib (HCC)   . Acute CHF (HCC)   . Hyperlipidemia   . Myocardial infarction (HCC) 2014  . Heart murmur   . Pneumonia 10/2014  . Anxiety   . ESRD (end stage renal disease) on dialysis (HCC)     "MWF; [facility]; [place]" (11/03/2014)  . Pseudoaneurysm following procedure (HCC) 05/2013    s/p cardiac cath; closed with Vasc US compression   Past Surgical History  Procedure Laterality Date  . Av fistula placement Left ~ 2012    upper arm arm  . Forearm fracture surgery Bilateral     "fell in the basement"  . Fracture surgery    . Pseudoaneurysm repair Right 05/2013    leg  . Cataract extraction w/phaco Right 10/19/2013    Procedure: CATARACT EXTRACTION PHACO AND INTRAOCULAR LENS PLACEMENT (IOC);  Surgeon: Loraine LericheMark T. Nile RiggsShapiro, MD;  Location: [facility];  Service: Ophthalmology;  Laterality: Right;  CDE:15.66  . Cataract extraction w/phaco Left 11/02/2013    Procedure: CATARACT EXTRACTION PHACO AND INTRAOCULAR LENS PLACEMENT (IOC);  Surgeon: Loraine LericheMark T. Nile RiggsShapiro, MD;  Location: [facility];  Service: Ophthalmology;  Laterality: Left;  CDE:8.78  . Left and right heart catheterization with coronary angiogram N/A 05/11/2013    Procedure: LEFT AND RIGHT HEART CATHETERIZATION WITH CORONARY ANGIOGRAM;  Surgeon:  Runell GessJonathan J Berry, MD;  [facility];  Service: Cardiovascular;  Laterality: N/A;  . Percutaneous coronary rotoblator intervention (pci-r) N/A 05/14/2013    Procedure: PERCUTANEOUS CORONARY ROTOBLATOR INTERVENTION (PCI-R);  Surgeon: Lennette Biharihomas A Kelly, MD;  [facility];  Service: Cardiovascular;  Laterality: N/A;  . Percutaneous coronary rotoblator intervention (pci-r) N/A 05/19/2013    Procedure: PERCUTANEOUS CORONARY ROTOBLATOR INTERVENTION (PCI-R);  Surgeon: Lennette Biharihomas A Kelly, MD;  [facility];  Service: Cardiovascular;  Laterality: N/A;  . Cardiac catheterization  ; 11/03/2014  . Coronary angioplasty with stent placement    . Cataract extraction w/ intraocular lens  implant, bilateral Bilateral 2015  . Left and right heart catheterization with coronary angiogram N/A 11/03/2014    Procedure: LEFT AND RIGHT HEART CATHETERIZATION WITH CORONARY ANGIOGRAM;  Surgeon: Kathleene Hazelhristopher D McAlhany, MD;  [facility];  Service: Cardiovascular;  Laterality: N/A;   Family History  Problem Relation Age of Onset  . Heart attack Sister   . Hyperlipidemia Mother   . Stroke Father   . Diabetes Brother    Social History  Substance Use Topics  . Smoking status: Never Smoker   . Smokeless tobacco: Never Used  . Alcohol Use: No    Review of Systems ROS: Statement: All systems negative except as marked or noted in  the HPI; Constitutional: Negative for fever and chills. ; ; Eyes: Negative for eye pain, redness and discharge. ; ; ENMT: Negative for ear pain, hoarseness, nasal congestion, sinus pressure and sore throat. ; ; Cardiovascular: Negative for chest pain, palpitations, diaphoresis, dyspnea and peripheral edema. ; ; Respiratory: Negative for cough, wheezing and stridor. ; ; Gastrointestinal: Negative for nausea, vomiting, diarrhea, abdominal pain, blood in stool, hematemesis, jaundice and rectal bleeding. ; ; Genitourinary: Negative for dysuria, flank pain and hematuria. ; ;  Musculoskeletal: +right foot pain. Negative for back pain and neck pain. Negative for trauma.; ; Skin: +right foot discoloration. Negative for pruritus, abrasions, blisters, bruising and skin lesion.; ; Neuro: Negative for headache, lightheadedness and neck stiffness. Negative for weakness, altered level of consciousness , altered mental status, extremity weakness, paresthesias, involuntary movement, seizure and syncope.      Allergies  Sulfa antibiotics  Home Medications   Prior to Admission medications   Medication Sig Start Date End Date Taking? Authorizing Provider  acetaminophen-codeine (TYLENOL #3) 300-30 MG tablet Take 1 tablet by mouth every 6 (six) hours as needed for moderate pain.   Yes Historical Provider, MD  albuterol (PROVENTIL HFA;VENTOLIN HFA) 108 (90 BASE) MCG/ACT inhaler Inhale 2 puffs into the lungs every 4 (four) hours as needed for wheezing or shortness of breath. 11/20/14  Yes Margarita Grizzle, MD  amiodarone (PACERONE) 200 MG tablet TAKE ONE TABLET BY MOUTH DAILY. 04/26/15  Yes Kathleene Hazel, MD  darbepoetin (ARANESP) 100 MCG/0.5ML SOLN injection Inject 100 mcg into the skin See admin instructions. Takes every Monday, Wednesday and Friday with dialysis   Yes Historical Provider, MD  doxercalciferol (HECTOROL) 4 MCG/2ML injection Inject 3.5 mcg into the vein every Monday, Wednesday, and Friday with hemodialysis.   Yes Historical Provider, MD  HYDROcodone-acetaminophen (NORCO) 5-325 MG tablet Take 1 tablet by mouth 2 (two) times daily as needed for moderate pain.   Yes Historical Provider, MD  nitroGLYCERIN (NITROSTAT) 0.4 MG SL tablet Place 1 tablet (0.4 mg total) under the tongue every 5 (five) minutes as needed for chest pain. 11/04/14  Yes Abelino Derrick, PA-C  warfarin (COUMADIN) 1 MG tablet Take 1 1/2 tablets daily except 2 tablets on Mondays, Wednesdays and Fridays 09/28/15  Yes Chrystie Nose, MD  atorvastatin (LIPITOR) 40 MG tablet Take 1 tablet (40 mg total) by  mouth daily at 6 PM. Patient not taking: Reported on 10/07/2015 08/02/15   Henderson Cloud, MD  benzonatate (TESSALON) 100 MG capsule Take 1 capsule (100 mg total) by mouth 3 (three) times daily as needed for cough. Patient not taking: Reported on 10/07/2015 08/02/15   Henderson Cloud, MD  isosorbide mononitrate (IMDUR) 30 MG 24 hr tablet Take 1 tablet (30 mg total) by mouth daily. Patient not taking: Reported on 10/07/2015 08/02/15   Henderson Cloud, MD   BP 114/51 mmHg  Pulse 72  Temp(Src) 97.6 F (36.4 C) (Oral)  Resp 15  Ht  (1.626 m)  Wt 98 lb (44.453 kg)  BMI 16.81 kg/m2  SpO2 100% Physical Exam  1355: Physical examination:  Nursing notes reviewed; Vital signs and O2 SAT reviewed;  Constitutional: Well developed, Well nourished, Well hydrated, In no acute distress; Head:  Normocephalic, atraumatic; Eyes: EOMI, PERRL, No scleral icterus; ENMT: Mouth and pharynx normal, Mucous membranes moist; Neck: Supple, Full range of motion, No lymphadenopathy; Cardiovascular: Irregular rate and rhythm, No gallop; Respiratory: Breath sounds clear & equal bilaterally, No wheezes.  Speaking  full sentences with ease, Normal respiratory effort/excursion; Chest: Nontender, Movement normal; Abdomen: Soft, Nontender, Nondistended, Normal bowel sounds; Genitourinary: No CVA tenderness; Extremities: No deformity. No calf tenderness, edema or asymmetry. NT right knee/ankle. +1 edema right ankle and foot. +right forefoot cool, dusky, very delayed cap refill in toes. +dried/cracked skin right > left forefoot and toes. +small superficial wound right medial 2nd distal toe.; Neuro: AA&Ox3, Major CN grossly intact.  Speech clear. No gross focal motor deficits in extremities.; Skin: Color normal, Warm, Dry.   ED Course  Procedures (including critical care time) Labs Review  Imaging Review  I have personally reviewed and evaluated these images and lab results as part of my medical  decision-making.   EKG Interpretation None      MDM  MDM Reviewed: previous chart, nursing note and vitals Reviewed previous: labs Interpretation: labs and x-ray Total time providing critical care: 30-74 minutes. This excludes time spent performing separately reportable procedures and services. Consults: admitting MD (Vascular Surgery MD)   CRITICAL CARE Performed by: Laray Anger Total critical care time: 35 minutes Critical care time was exclusive of separately billable procedures and treating other patients. Critical care was necessary to treat or prevent imminent or life-threatening deterioration. Critical care was time spent personally by me on the following activities: development of treatment plan with patient and/or surrogate as well as nursing, discussions with consultants, evaluation of patient's response to treatment, examination of patient, obtaining history from patient or surrogate, ordering and performing treatments and interventions, ordering and review of laboratory studies, ordering and review of radiographic studies, pulse oximetry and re-evaluation of patient's condition.   Results for orders placed or performed during the hospital encounter of 10/07/15  CBC with Differential  Result Value Ref Range   WBC 6.2 4.0 - 10.5 K/uL   RBC 4.05 3.87 - 5.11 MIL/uL   Hemoglobin 12.0 12.0 - 15.0 g/dL   HCT 81.1 91.4 - 78.2 %   MCV 91.6 78.0 - 100.0 fL   MCH 29.6 26.0 - 34.0 pg   MCHC 32.3 30.0 - 36.0 g/dL   RDW 95.6 (H) 21.3 - 08.6 %   Platelets 208 150 - 400 K/uL   Neutrophils Relative % 53 %   Neutro Abs 3.3 1.7 - 7.7 K/uL   Lymphocytes Relative 27 %   Lymphs Abs 1.7 0.7 - 4.0 K/uL   Monocytes Relative 10 %   Monocytes Absolute 0.6 0.1 - 1.0 K/uL   Eosinophils Relative 9 %   Eosinophils Absolute 0.6 0.0 - 0.7 K/uL   Basophils Relative 1 %   Basophils Absolute 0.0 0.0 - 0.1 K/uL  Comprehensive metabolic panel  Result Value Ref Range   Sodium 139 135 -  145 mmol/L   Potassium 2.9 (L) 3.5 - 5.1 mmol/L   Chloride 101 101 - 111 mmol/L   CO2 27 22 - 32 mmol/L   Glucose, Bld 112 (H) 65 - 99 mg/dL   BUN 39 (H) 6 - 20 mg/dL   Creatinine, Ser 5.78 (H) 0.44 - 1.00 mg/dL   Calcium 7.6 (L) 8.9 - 10.3 mg/dL   Total Protein 6.3 (L) 6.5 - 8.1 g/dL   Albumin 3.0 (L) 3.5 - 5.0 g/dL   AST 14 (L) 15 - 41 U/L   ALT 7 (L) 14 - 54 U/L   Alkaline Phosphatase 63 38 - 126 U/L   Total Bilirubin 0.9 0.3 - 1.2 mg/dL   GFR calc non Af Amer 11 (L) >60 mL/min   GFR calc  Af Amer 12 (L) >60 mL/min   Anion gap 11 5 - 15  Protime-INR  Result Value Ref Range   Prothrombin Time 19.8 (H) 11.6 - 15.2 seconds   INR 1.68 (H) 0.00 - 1.49   Dg Foot Complete Right 10/07/2015  CLINICAL DATA:  Pain between the first and second toes for 2 weeks. Renal failure, dialysis dependent. EXAM: RIGHT FOOT COMPLETE - 3+ VIEW COMPARISON:  12/07/2014 FINDINGS: Diffuse osteopenia evident. Chronic irregularity and bony deformity of the right fifth MTP joint and the right fifth proximal phalanx. Peripheral atherosclerosis evident. No acute osseous finding or fracture. Minor dorsal soft tissue swelling on the lateral view. IMPRESSION: Osteopenia and chronic deformity of the right fifth MTP joint and fifth proximal phalanx. Mild dorsal soft tissue swelling. No acute osseous finding by plain radiography Peripheral atherosclerosis Electronically Signed   By: Judie Petit.  Shick [name]   On: 10/07/2015 12:32    Results for SHAMIAH, KAHLER (MRN [id number]) as of 10/07/2015 15:05  Ref. Range 11/20/2014 10:19 07/31/2015 15:35 08/01/2015 03:43 08/02/2015 14:00 10/07/2015 12:27  BUN Latest Ref Range: 6-20 mg/dL 19 31 (H) 39 (H) 55 (H) 39 (H)  Creatinine Latest Ref Range: 0.44-1.00 mg/dL 4.09 (H) 8.11 (H) 9.14 (H) 5.68 (H) 3.63 (H)    1430:  Unable to palpate pulses right foot. Right Doppler: +PT (biphasic), +AP (monophasic), no DP.  Left Doppler: +PT, +AT, +DP. T/C to [facility] Vascular Surgeon Dr. Arbie Cookey, case discussed,  including:  HPI, pertinent PM/SHx, VS/PE, dx testing, ED course and treatment:  NPO, start IV heparin gtt, requests to transfer to [facility] under [facility] service, call him to consult after pt's arrival. T/C to [facility] Dr. Sunnie Nielsen, case discussed, including:  HPI, pertinent PM/SHx, VS/PE, dx testing, ED course and treatment:  Agreeable to facilitate transfer/admit to [facility], requests she will come to the ED.       Samuel Jester, DO 10/08/15 2137

## 2015-10-07 NOTE — ED Notes (Signed)
EDP at bedside  

## 2015-10-07 NOTE — Progress Notes (Signed)
[facility] hospitalist progress note. Chief complaint. Transfer note. History of present illness. This 82 year old female presented to [facility] with complaints of right foot pain. Patient had been experiencing worsening right foot pain and had been unable to walk. Family noticed redness of the right forefoot. Foot pain started about 4 weeks ago. Her emergency room evaluation the patient was noted to have no pedal pulses with concern for ischemic foot. Case was discussed with vascular surgery requested patient be [facility] and treatment. Patient has now arrived in transfer and I'm seeing her at bedside to ensure she remains clinically stable and that her orders transferred here appropriately. Physical exam. Vital signs. General 97.5, pulse 73, respiration 16, blood pressure 112/48. O2 sats 95%. General appearance. Frail elderly female who is alert and in no distress. Cardiac. Rate and rhythm regular. 3/6 systolic ejection murmur. Lungs. Breath sounds clear but reduced. Abdomen. Soft with positive bowel sounds. Extremities. Both feet are cool and I palpate no pedal pulses bilaterally. Impression/plan. Problem #1. Right foot pain with Doppler finding no pedal pulses. Concern for ischemic foot. Patient has been seen by vascular surgery. We'll continue on antibiotics and follow for surgery's recommendations. I was verbally told that surgery would probably not be necessary. Problem #2. End-stage renal disease. Nephrology will follow.  Problem #3. Coronary artery disease, severe aortic stenosis, etc. Follow and monitor. Problem #4. Atrial fib. Continue amiodarone. Patient on IV heparin. Resume Coumadin when okay with vascular surgery. Problem #5. Hypokalemia. Supplement. The patient appears clinically stable post transfer. All orders appear to of transferred here appropriately.

## 2015-10-07 NOTE — ED Notes (Signed)
Pt comes in with right foot swelling and redness. Pt states she has an appt to see her doctor for this problem on Monday but her pain has increased since speaking with them.

## 2015-10-07 NOTE — Consult Note (Signed)
Patient name: [name] MRN: [id number] DOB: 06/03/1928 Sex: female     Reason for referral:  Chief Complaint  Patient presents with  . Foot Pain    HISTORY OF PRESENT ILLNESS: 82 year old female with multiple medical problems. End-stage renal disease. Myocardial infarction in the past with coronary artery disease and aortic stenosis. Presented to the emergency department at [facility] with worsening right foot pain. History of A. fib on Coumadin. Reported that the discomfort was present for several weeks but it become worse over the last 1 week the emergency room physician was concerned about potentially new ischemia and she was transferred [facility] evaluation. I discussed this with the emergency room physician proximally 2:30 PM. The patient did not [facility] to [facility] to approximately 8 PM. Fortunately she does not have any profound ischemia.  Past Medical History  Diagnosis Date  . Anemia 03/06/2011  . Hypertension   . Coronary artery disease     PCI 2014, 2016  . Aortic stenosis, mild     severe aortic stenosis  . A-fib (HCC)   . Acute CHF (HCC)   . Hyperlipidemia   . Myocardial infarction (HCC) 2014  . Heart murmur   . Pneumonia 10/2014  . Anxiety   . ESRD (end stage renal disease) on dialysis (HCC)     "MWF; [facility]; [place]" (11/03/2014)  . Pseudoaneurysm following procedure (HCC) 05/2013    s/p cardiac cath; closed with Vasc US compression    Past Surgical History  Procedure Laterality Date  . Av fistula placement Left ~ 2012    upper arm arm  . Forearm fracture surgery Bilateral     "fell in the basement"  . Fracture surgery    . Pseudoaneurysm repair Right 05/2013    leg  . Cataract extraction w/phaco Right 10/19/2013    Procedure: CATARACT EXTRACTION PHACO AND INTRAOCULAR LENS PLACEMENT (IOC);  Surgeon: Loraine LericheMark T. Nile RiggsShapiro, MD;  Location: [facility];  Service: Ophthalmology;  Laterality: Right;  CDE:15.66  . Cataract extraction w/phaco  Left 11/02/2013    Procedure: CATARACT EXTRACTION PHACO AND INTRAOCULAR LENS PLACEMENT (IOC);  Surgeon: Loraine LericheMark T. Nile RiggsShapiro, MD;  Location: [facility];  Service: Ophthalmology;  Laterality: Left;  CDE:8.78  . Left and right heart catheterization with coronary angiogram N/A 05/11/2013    Procedure: LEFT AND RIGHT HEART CATHETERIZATION WITH CORONARY ANGIOGRAM;  Surgeon: Runell GessJonathan J Berry, MD;  [facility];  Service: Cardiovascular;  Laterality: N/A;  . Percutaneous coronary rotoblator intervention (pci-r) N/A 05/14/2013    Procedure: PERCUTANEOUS CORONARY ROTOBLATOR INTERVENTION (PCI-R);  Surgeon: Lennette Biharihomas A Kelly, MD;  [facility];  Service: Cardiovascular;  Laterality: N/A;  . Percutaneous coronary rotoblator intervention (pci-r) N/A 05/19/2013    Procedure: PERCUTANEOUS CORONARY ROTOBLATOR INTERVENTION (PCI-R);  Surgeon: Lennette Biharihomas A Kelly, MD;  [facility];  Service: Cardiovascular;  Laterality: N/A;  . Cardiac catheterization  ; 11/03/2014  . Coronary angioplasty with stent placement    . Cataract extraction w/ intraocular lens  implant, bilateral Bilateral 2015  . Left and right heart catheterization with coronary angiogram N/A 11/03/2014    Procedure: LEFT AND RIGHT HEART CATHETERIZATION WITH CORONARY ANGIOGRAM;  Surgeon: Kathleene Hazelhristopher D McAlhany, MD;  [facility];  Service: Cardiovascular;  Laterality: N/A;    Social History   Social History  . Marital Status: Married    Spouse Name: N/A  . Number of Children: N/A  . Years of Education: N/A  Occupational History  . Not on file.   Social History Main Topics  . Smoking status: Never Smoker   . Smokeless tobacco: Never Used  . Alcohol Use: No  . Drug Use: No  . Sexual Activity: No   Other Topics Concern  . Not on file   Social History Narrative    Family History  Problem Relation Age of Onset  . Heart attack Sister   . Hyperlipidemia Mother   . Stroke Father   . Diabetes Brother     Allergies as of  10/07/2015 - Review Complete 10/07/2015  Allergen Reaction Noted  . Sulfa antibiotics  08/02/2015    No current facility-administered medications on file prior to encounter.   Current Outpatient Prescriptions on File Prior to Encounter  Medication Sig Dispense Refill  . albuterol (PROVENTIL HFA;VENTOLIN HFA) 108 (90 BASE) MCG/ACT inhaler Inhale 2 puffs into the lungs every 4 (four) hours as needed for wheezing or shortness of breath. 1 Inhaler 0  . amiodarone (PACERONE) 200 MG tablet TAKE ONE TABLET BY MOUTH DAILY. 30 tablet 5  . darbepoetin (ARANESP) 100 MCG/0.5ML SOLN injection Inject 100 mcg into the skin See admin instructions. Takes every Monday, Wednesday and Friday with dialysis    . doxercalciferol (HECTOROL) 4 MCG/2ML injection Inject 3.5 mcg into the vein every Monday, Wednesday, and Friday with hemodialysis.    Marland Kitchen nitroGLYCERIN (NITROSTAT) 0.4 MG SL tablet Place 1 tablet (0.4 mg total) under the tongue every 5 (five) minutes as needed for chest pain. 25 tablet 2  . warfarin (COUMADIN) 1 MG tablet Take 1 1/2 tablets daily except 2 tablets on Mondays, Wednesdays and Fridays 60 tablet 2  . atorvastatin (LIPITOR) 40 MG tablet Take 1 tablet (40 mg total) by mouth daily at 6 PM. (Patient not taking: Reported on 10/07/2015) 30 tablet 2  . benzonatate (TESSALON) 100 MG capsule Take 1 capsule (100 mg total) by mouth 3 (three) times daily as needed for cough. (Patient not taking: Reported on 10/07/2015) 20 capsule 0  . isosorbide mononitrate (IMDUR) 30 MG 24 hr tablet Take 1 tablet (30 mg total) by mouth daily. (Patient not taking: Reported on 10/07/2015) 30 tablet 2     REVIEW OF SYSTEMS: Reviewed her history and physical with nothing to add  PHYSICAL EXAMINATION:  General: The patient is a well-nourished female, in no acute distress. Vital signs are BP 112/48 mmHg  Pulse 73  Temp(Src) 97.5 F (36.4 C) (Oral)  Resp 16  Ht  (1.626 m)  Wt 104 lb 1.6 oz (47.219 kg)  BMI 17.86 kg/m2   SpO2 95% Pulmonary: There is a good air exchange  Abdomen: Soft and non-tender Musculoskeletal: There are no major deformities.  There is no significant extremity pain. Neurologic: No focal weakness or paresthesias are detected,She is able to move her toes and does feel her feel feet bilaterally  Skin:Irregular dry skin on her feet bilaterally but no tissue loss  Psychiatric: The patient has normal affect. Cardiovascular:Palpable femoral pulses bilaterally but absent popliteal and distal pulses bilaterally Well matured left upper arm AV fistula Hand-held Doppler revealed audible signals in her left peroneal and posterior tibial and on her right posterior tibial peroneal and anterior tibial at the ankle    Impression and Plan:  Unclear as to the cause of her acute worsening of foot pain on the right. Does not appear to be related to arterial insufficiency. Does have significant arterial insufficiency bilaterally. Does have normal femoral pulses making embolic disease from  her A. fib unlikely. She is subtherapeutic on her Coumadin. According to her multiple family members present, the patient does not stand to walk. She needs maximal assistance even to go from bed to chair and cannot do this independently. Will follow with you but do not recommend any further evaluation regarding arterial flow currently since this does not appear to be the cause of her discomfort.   Gretta Began [facility] Office: [phone]

## 2015-10-07 NOTE — Progress Notes (Addendum)
   10/07/15 1936  Vitals  Temp 97.5 F (36.4 C)  Temp Source Oral  BP (!) 112/48 mmHg  BP Location Right Arm  BP Method Automatic  Patient Position (if appropriate) Lying  Pulse Rate Source Dinamap  ECG Heart Rate 76  Resp 16  Height and Weight  Height 5\' 4"  (1.626 m)  Weight 47.219 kg (104 lb 1.6 oz)  Type of Scale Used Bed  BSA (Calculated - sq m) 1.46 sq meters  BMI (Calculated) 17.9  Weight in (lb) to have BMI = 25 145.3    [facility]. Patient alert and oriented X4, oriented to call bell, room, and unit. CCMD called and verified. Family at bedside. Will continue to monitor closely.  Jinny SandersJadji, Vicky Mccanless RN

## 2015-10-08 ENCOUNTER — Inpatient Hospital Stay (HOSPITAL_COMMUNITY): Payer: Medicare Other

## 2015-10-08 DIAGNOSIS — I480 Paroxysmal atrial fibrillation: Secondary | ICD-10-CM | POA: Diagnosis present

## 2015-10-08 DIAGNOSIS — Z961 Presence of intraocular lens: Secondary | ICD-10-CM | POA: Diagnosis present

## 2015-10-08 DIAGNOSIS — I998 Other disorder of circulatory system: Secondary | ICD-10-CM | POA: Diagnosis not present

## 2015-10-08 DIAGNOSIS — M79671 Pain in right foot: Secondary | ICD-10-CM | POA: Diagnosis present

## 2015-10-08 DIAGNOSIS — N186 End stage renal disease: Secondary | ICD-10-CM | POA: Diagnosis not present

## 2015-10-08 DIAGNOSIS — I70201 Unspecified atherosclerosis of native arteries of extremities, right leg: Secondary | ICD-10-CM | POA: Diagnosis present

## 2015-10-08 DIAGNOSIS — Z992 Dependence on renal dialysis: Secondary | ICD-10-CM | POA: Diagnosis not present

## 2015-10-08 DIAGNOSIS — I120 Hypertensive chronic kidney disease with stage 5 chronic kidney disease or end stage renal disease: Secondary | ICD-10-CM | POA: Diagnosis not present

## 2015-10-08 DIAGNOSIS — Z955 Presence of coronary angioplasty implant and graft: Secondary | ICD-10-CM | POA: Diagnosis not present

## 2015-10-08 DIAGNOSIS — E876 Hypokalemia: Secondary | ICD-10-CM | POA: Diagnosis present

## 2015-10-08 DIAGNOSIS — Z7901 Long term (current) use of anticoagulants: Secondary | ICD-10-CM | POA: Diagnosis not present

## 2015-10-08 DIAGNOSIS — I5032 Chronic diastolic (congestive) heart failure: Secondary | ICD-10-CM | POA: Diagnosis present

## 2015-10-08 DIAGNOSIS — N2581 Secondary hyperparathyroidism of renal origin: Secondary | ICD-10-CM | POA: Diagnosis not present

## 2015-10-08 DIAGNOSIS — I959 Hypotension, unspecified: Secondary | ICD-10-CM | POA: Diagnosis not present

## 2015-10-08 DIAGNOSIS — I132 Hypertensive heart and chronic kidney disease with heart failure and with stage 5 chronic kidney disease, or end stage renal disease: Secondary | ICD-10-CM | POA: Diagnosis not present

## 2015-10-08 DIAGNOSIS — Z66 Do not resuscitate: Secondary | ICD-10-CM | POA: Diagnosis present

## 2015-10-08 DIAGNOSIS — Z9842 Cataract extraction status, left eye: Secondary | ICD-10-CM | POA: Diagnosis not present

## 2015-10-08 DIAGNOSIS — I2510 Atherosclerotic heart disease of native coronary artery without angina pectoris: Secondary | ICD-10-CM | POA: Diagnosis present

## 2015-10-08 DIAGNOSIS — D631 Anemia in chronic kidney disease: Secondary | ICD-10-CM | POA: Diagnosis not present

## 2015-10-08 DIAGNOSIS — I999 Unspecified disorder of circulatory system: Secondary | ICD-10-CM

## 2015-10-08 DIAGNOSIS — I482 Chronic atrial fibrillation: Secondary | ICD-10-CM | POA: Diagnosis present

## 2015-10-08 DIAGNOSIS — I350 Nonrheumatic aortic (valve) stenosis: Secondary | ICD-10-CM | POA: Diagnosis present

## 2015-10-08 DIAGNOSIS — Z9841 Cataract extraction status, right eye: Secondary | ICD-10-CM | POA: Diagnosis not present

## 2015-10-08 DIAGNOSIS — G629 Polyneuropathy, unspecified: Secondary | ICD-10-CM | POA: Diagnosis not present

## 2015-10-08 DIAGNOSIS — E785 Hyperlipidemia, unspecified: Secondary | ICD-10-CM | POA: Diagnosis present

## 2015-10-08 DIAGNOSIS — I252 Old myocardial infarction: Secondary | ICD-10-CM | POA: Diagnosis not present

## 2015-10-08 LAB — BASIC METABOLIC PANEL
ANION GAP: 14 (ref 5–15)
BUN: 38 mg/dL — ABNORMAL HIGH (ref 6–20)
CALCIUM: 7.6 mg/dL — AB (ref 8.9–10.3)
CO2: 22 mmol/L (ref 22–32)
Chloride: 103 mmol/L (ref 101–111)
Creatinine, Ser: 4.2 mg/dL — ABNORMAL HIGH (ref 0.44–1.00)
GFR, EST AFRICAN AMERICAN: 10 mL/min — AB (ref >60)
GFR, EST NON AFRICAN AMERICAN: 9 mL/min — AB (ref >60)
Glucose, Bld: 98 mg/dL (ref 65–99)
POTASSIUM: 3.7 mmol/L (ref 3.5–5.1)
Sodium: 139 mmol/L (ref 135–145)

## 2015-10-08 LAB — CBC
HCT: 35.4 % — ABNORMAL LOW (ref 36.0–46.0)
HEMOGLOBIN: 11.2 g/dL — AB (ref 12.0–15.0)
MCH: 28.3 pg (ref 26.0–34.0)
MCHC: 31.6 g/dL (ref 30.0–36.0)
MCV: 89.4 fL (ref 78.0–100.0)
Platelets: 180 10*3/uL (ref 150–400)
RBC: 3.96 MIL/uL (ref 3.87–5.11)
RDW: 17.1 % — ABNORMAL HIGH (ref 11.5–15.5)
WBC: 7.1 10*3/uL (ref 4.0–10.5)

## 2015-10-08 LAB — HEPARIN LEVEL (UNFRACTIONATED): HEPARIN UNFRACTIONATED: 0.83 [IU]/mL — AB (ref 0.30–0.70)

## 2015-10-08 LAB — MRSA PCR SCREENING: MRSA BY PCR: NEGATIVE

## 2015-10-08 MED ORDER — HYDROMORPHONE HCL 1 MG/ML IJ SOLN: 0.2500 mg | INTRAMUSCULAR | Status: DC | PRN

## 2015-10-08 MED ORDER — SODIUM CHLORIDE 0.9 % IV BOLUS (SEPSIS)
250.0000 mL | Freq: Once | INTRAVENOUS | Status: AC
  Administered 2015-10-08: 250 mL via INTRAVENOUS

## 2015-10-08 MED ORDER — VANCOMYCIN HCL 500 MG IV SOLR
500.0000 mg | INTRAVENOUS | Status: DC
  Administered 2015-10-09: 500 mg via INTRAVENOUS
  Filled 2015-10-08: qty 500

## 2015-10-08 MED ORDER — RENA-VITE PO TABS
1.0000 | ORAL_TABLET | Freq: Every day | ORAL | Status: DC
  Administered 2015-10-08: 1 via ORAL
  Filled 2015-10-08 (×2): qty 1

## 2015-10-08 MED ORDER — MIDODRINE HCL 5 MG PO TABS
5.0000 mg | ORAL_TABLET | Freq: Three times a day (TID) | ORAL | Status: DC
  Administered 2015-10-08 – 2015-10-10 (×6): 5 mg via ORAL
  Filled 2015-10-08 (×5): qty 1

## 2015-10-08 NOTE — Progress Notes (Signed)
Pharmacy Antibiotic Note  [name] is a 82 y.o. female admitted on 10/07/2015 with wound infection.  Pharmacy has been consulted for vanc/zosyn dosing. Afeb, wbc wnl. No cultures. ESRD on HD MWF outpatient - Renal has not yet seen.  Plan: Zosyn 2.25g IV q8h Vanc 1g x1; then 500mg  IV qHD (Not yet entered) Monitor clinical progress, c/s, renal function, abx plan/LOT Pre-HD vanc lvl prn F/u HD to schedule vanc maintenance doses  Height: 5\' 4"  (162.6 cm) Weight: 104 lb 1.6 oz (47.219 kg) IBW/kg (Calculated) : 54.7  Temp (24hrs), Avg:97.9 F (36.6 C), Min:97.5 F (36.4 C), Max:98.4 F (36.9 C)   Recent Labs Lab 10/07/15 1227 10/08/15 0300  WBC 6.2 7.1  CREATININE 3.63* 4.20*    Estimated Creatinine Clearance: 7.3 mL/min (by C-G formula based on Cr of 4.2).    Allergies  Allergen Reactions  . Sulfa Antibiotics     Antimicrobials this admission: 4/1 vanc >>  4/2 zosyn >>   Dose adjustments this admission: n/a  Microbiology results:    [name], PharmD, [facility] Clinical Pharmacist Pager [phone] 10/08/2015 11:07 AM

## 2015-10-08 NOTE — Progress Notes (Signed)
ANTICOAGULATION CONSULT NOTE - Follow Up Consult  Pharmacy Consult for heparin Indication: no DP pulse R foot  Allergies  Allergen Reactions  . Sulfa Antibiotics     Patient Measurements: Height: 5\' 4"  (162.6 cm) Weight: 104 lb 1.6 oz (47.219 kg) IBW/kg (Calculated) : 54.7 Heparin Dosing Weight: 44.5 kg  Vital Signs: Temp: 98.4 F (36.9 C) (04/02 0422) Temp Source: Oral (04/02 0422) BP: 89/42 mmHg (04/02 0422) Pulse Rate: 71 (04/02 0422)  Labs:  Recent Labs  10/07/15 1227 10/08/15 0300  HGB 12.0 11.2*  HCT 37.1 35.4*  PLT 208 180  LABPROT 19.8*  --   INR 1.68*  --   CREATININE 3.63* 4.20*    Estimated Creatinine Clearance: 7.3 mL/min (by C-G formula based on Cr of 4.2).  Assessment: 87 yof to AP w/ worsening R foot pain. No DP pulse in R foot. Heparin for afib on warfarin, INR 1.68 on admit. No surgery recommended per Vasc Surg.   HL high (0.83) on 600 units/h. Hg 11.2, plt wnl. No bleed reported.  PTA dose: 1.5mg  daily except 2mg  MWF (last dose pta 3/31)  Goal of Therapy:  Heparin level 0.3-0.7 units/ml Monitor platelets by anticoagulation protocol: Yes   Plan:  Decrease heparin to 500 units/h 8h HL, daily HL/CBC Monitor for bleeding complications F/u resuming warfarin when ok with Vasc Surg  Babs BertinHaley Javaya [place], PharmD, BCPS Clinical Pharmacist Pager [phone] 10/08/2015 11:00 AM

## 2015-10-08 NOTE — Progress Notes (Signed)
   10/08/15 0422  Vitals  Temp 98.4 F (36.9 C)  Temp Source Oral  BP (!) 89/42 mmHg  BP Location Right Arm  BP Method Automatic  Patient Position (if appropriate) Lying  Pulse Rate 71  Pulse Rate Source Dinamap  ECG Heart Rate (!) 54  Resp 14  Oxygen Therapy  SpO2 97 %  O2 Device Room Air    Patient BP was low this AM. MD notified. 250 ml Bolus of NS was ordered and given. Will continue to monitor closely.  Jinny SandersJadji,  [place] RN

## 2015-10-08 NOTE — Progress Notes (Signed)
[facility] Progress Note                                                                                                                                                                                                                      Patient Demographics:    [name], is a 82 y.o. female, DOB - May 23, 1928, ZOX:[id number]  Admit date - 10/07/2015   Admitting Physician Alba Cory, MD  Outpatient Primary MD for the patient is [name], MD  LOS - 1  Outpatient Specialists:   Chief Complaint  Patient presents with  . Foot Pain        Subjective:    [name] today has, No headache, No chest pain, No abdominal pain - No Nausea, No new weakness tingling or numbness, No Cough - SOB. +ve R foot pain.   Assessment  & Plan :     1. Right foot ischemia with pain. Vascular surgery on board, trying to manage conservatively, currently on empiric antibiotics along with heparin, will defer management to vascular surgery at this point.  2. ESRD. On Monday, Wednesday and Friday dialysis schedule. Renal consulted.  3. History of CAD with stent to RCA April 2016, also severe aortic stenosis. Currently compensated, recent echo reviewed, patient will be a high-risk candidate for any operative procedure this has been explained clearly to patient and her daughter. Both accept this.  4. Chronic borderline hypotension. Placed on midodrine will monitor.  5. Chronic A. fib. [place] vasc 2 score of greater than 5. Goal will be rate control, continue amiodarone, Coumadin currently on hold patient on IV heparin.    Code Status : Full  Family Communication  : Daughter  Disposition Plan  : TBD  Barriers For Discharge :   Consults  :  Renal, VVS  Procedures  :    TTE Jan 2017  Left ventricle: The cavity size was normal. Wall thickness was increased in a pattern of moderate  LVH. Systolic function was normal. The estimated ejection fraction was in the range of 55% to 60%. - Aortic valve: There was severe stenosis. There was mild regurgitation. Valve area (VTI): 0.54 cm^2. Valve area (Vmax): 0.48 cm^2. Valve area (Vmean): 0.43 cm^2. - Mitral valve: Calcified annulus. There was moderate regurgitation. Valve area by continuity equation (using LVOT  flow): 1.14 cm^2. - Left atrium: The atrium was severely dilated. - Right atrium: The atrium was mildly dilated. - Atrial septum: No defect or patent foramen ovale was identified. -  Tricuspid valve: There was moderate regurgitation. - Pulmonary arteries: PA peak pressure: 59 mm Hg (S).    DVT Prophylaxis  :  Heparin    Lab Results  Component Value Date   PLT 180 10/08/2015    Antibiotics  :    Anti-infectives    Start     Dose/Rate Route Frequency Ordered Stop   10/09/15 1200  vancomycin (VANCOCIN) 500 mg in sodium chloride 0.9 % 100 mL IVPB     500 mg 100 mL/hr over 60 Minutes Intravenous Every M-W-F (Hemodialysis) 10/08/15 1252     10/08/15 0200  piperacillin-tazobactam (ZOSYN) 2.25 g in dextrose 5 % 50 mL IVPB     2.25 g 100 mL/hr over 30 Minutes Intravenous Every 8 hours 10/07/15 1757     10/07/15 1800  piperacillin-tazobactam (ZOSYN) 2.25 g in dextrose 5 % 50 mL IVPB  Status:  Discontinued     2.25 g 100 mL/hr over 30 Minutes Intravenous 4 times per day 10/07/15 1609 10/07/15 1757   10/07/15 1743  piperacillin-tazobactam (ZOSYN) 3.375 GM/50ML IVPB  Status:  Discontinued    Comments:  Edwards, Christy   : cabinet override      10/07/15 1743 10/07/15 1758   10/07/15 1615  vancomycin (VANCOCIN) IVPB 1000 mg/200 mL premix     1,000 mg 200 mL/hr over 60 Minutes Intravenous  Once 10/07/15 1609 10/07/15 1742        Objective:   Filed Vitals:   10/07/15 1715 10/07/15 1730 10/07/15 1936 10/08/15 0422  BP:  115/49 112/48 89/42  Pulse: 72 73  71  Temp:   97.5 F (36.4 C) 98.4 F (36.9 C)  TempSrc:    Oral Oral  Resp: 15 16 16 14   Height:   5\' 4"  (1.626 m)   Weight:   47.219 kg (104 lb 1.6 oz)   SpO2: 99% 95%  97%    Wt Readings from Last 3 Encounters:  10/07/15 47.219 kg (104 lb 1.6 oz)  08/02/15 44.6 kg (98 lb 5.2 oz)  12/07/14 51.71 kg (114 lb)    No intake or output data in the 24 hours ending 10/08/15 1307   Physical Exam  Awake Alert, Oriented X 3, No new F.N deficits, Normal affect [place].AT,PERRAL Supple Neck,No JVD, No cervical lymphadenopathy appriciated.  Symmetrical Chest wall movement, Good air movement bilaterally, CTAB RRR,No Gallops,Rubs, positive aortic systolic murmur, No Parasternal Heave +ve B.Sounds, Abd Soft, No tenderness, No organomegaly appriciated, No rebound - guarding or rigidity. No Cyanosis, Clubbing or edema, No new Rash or bruise,  R foot somewhat colder as compared to left with some cyanosis    Data Review:    CBC  Recent Labs Lab 10/07/15 1227 10/08/15 0300  WBC 6.2 7.1  HGB 12.0 11.2*  HCT 37.1 35.4*  PLT 208 180  MCV 91.6 89.4  MCH 29.6 28.3  MCHC 32.3 31.6  RDW 17.2* 17.1*  LYMPHSABS 1.7  --   MONOABS 0.6  --   EOSABS 0.6  --   BASOSABS 0.0  --     Chemistries   Recent Labs Lab 10/07/15 1227 10/08/15 0300  NA 139 139  K 2.9* 3.7  CL 101 103  CO2 27 22  GLUCOSE 112* 98  BUN 39* 38*  CREATININE 3.63* 4.20*  CALCIUM 7.6* 7.6*  AST 14*  --   ALT 7*  --   ALKPHOS 63  --   BILITOT 0.9  --    ------------------------------------------------------------------------------------------------------------------ No results for input(s): CHOL, HDL,  LDLCALC, TRIG, CHOLHDL, LDLDIRECT in the last 72 hours.  Lab Results  Component Value Date   HGBA1C 5.8* 10/13/2014   ------------------------------------------------------------------------------------------------------------------ No results for input(s): TSH, T4TOTAL, T3FREE, THYROIDAB in the last 72 hours.  Invalid input(s):  FREET3 ------------------------------------------------------------------------------------------------------------------ No results for input(s): VITAMINB12, FOLATE, FERRITIN, TIBC, IRON, RETICCTPCT in the last 72 hours.  Coagulation profile  Recent Labs Lab 10/07/15 1227  INR 1.68*    No results for input(s): DDIMER in the last 72 hours.  Cardiac Enzymes No results for input(s): CKMB, TROPONINI, MYOGLOBIN in the last 168 hours.  Invalid input(s): CK ------------------------------------------------------------------------------------------------------------------    Component Value Date/Time   BNP >4500.0* 07/31/2015 1535    Inpatient Medications  Scheduled Meds: . amiodarone  200 mg Oral Daily  . atorvastatin  40 mg Oral q1800  . docusate sodium  100 mg Oral BID  . midodrine  5 mg Oral TID WC  . multivitamin  1 tablet Oral QHS  . piperacillin-tazobactam (ZOSYN) IVPB  2.25 g Intravenous Q8H  . sodium chloride flush  3 mL Intravenous Q12H  . sodium chloride flush  3 mL Intravenous Q12H  . [START ON 10/09/2015] vancomycin  500 mg Intravenous Q M,W,F-HD   Continuous Infusions: . heparin 500 Units/hr (10/08/15 1306)   PRN Meds:.sodium chloride, acetaminophen **OR** acetaminophen, albuterol, HYDROmorphone (DILAUDID) injection, morphine injection, nitroGLYCERIN, ondansetron **OR** ondansetron (ZOFRAN) IV, oxyCODONE, sodium chloride flush  Micro Results Recent Results (from the past 240 hour(s))  MRSA PCR Screening     Status: None   Collection Time: 10/08/15  4:45 AM  Result Value Ref Range Status   MRSA by PCR NEGATIVE NEGATIVE Final    Comment:        The GeneXpert MRSA Assay (FDA approved for NASAL specimens only), is one component of a comprehensive MRSA colonization surveillance program. It is not intended to diagnose MRSA infection nor to guide or monitor treatment for MRSA infections.     Radiology Reports Dg Chest 2 View  10/07/2015  CLINICAL DATA:   Renal failure.  Preop evaluation. EXAM: CHEST  2 VIEW COMPARISON:  07/31/2015 and 10/13/2014 FINDINGS: Patient is slightly rotated to the right. Lungs are adequately inflated without focal consolidation or effusion. There is mild prominence of the central perihilar markings likely mild vascular congestion. There is moderate stable cardiomegaly. There is calcified plaque over the thoracoabdominal aorta. There is diffuse osteopenia present. There are degenerative changes of the spine. Stable mild compression deformities over the mid to upper thoracic spine. IMPRESSION: Stable moderate cardiomegaly with evidence of mild vascular congestion. Stable mild compression deformities over the mid to upper thoracic spine. Electronically Signed   By: Elberta Fortis [name]   On: 10/07/2015 16:39   Dg Foot Complete Right  10/07/2015  CLINICAL DATA:  Pain between the first and second toes for 2 weeks. Renal failure, dialysis dependent. EXAM: RIGHT FOOT COMPLETE - 3+ VIEW COMPARISON:  12/07/2014 FINDINGS: Diffuse osteopenia evident. Chronic irregularity and bony deformity of the right fifth MTP joint and the right fifth proximal phalanx. Peripheral atherosclerosis evident. No acute osseous finding or fracture. Minor dorsal soft tissue swelling on the lateral view. IMPRESSION: Osteopenia and chronic deformity of the right fifth MTP joint and fifth proximal phalanx. Mild dorsal soft tissue swelling. No acute osseous finding by plain radiography Peripheral atherosclerosis Electronically Signed   By: Judie Petit.  Shick [name]   On: 10/07/2015 12:32    Time Spent in minutes   30   Tianni Escamilla K M.D on 10/08/2015 at 1:07  PM  Between 7am to 7pm - Pager - [phone]  After 7pm go to [email or web address] - password [facility]  [facility] -  Office  [phone]

## 2015-10-08 NOTE — Consult Note (Signed)
Reason for Consult: ESRD, anemia , HPTH Referring Physician: Dr. Brown [name] is an 82 y.o. female.  HPI: 82 yr [facility].  Has had progressive pain x 4 wk but has not walked in months.  No prob at HD.   Hx CAD, with PCI 2014,2016, AS, afib, ^lipids, HTN , anemia , HPTH.   Constitutional: weaker, nonamb Eyes: glasses Ears, nose, mouth, throat, and face: HOH, dentures Respiratory: negative Cardiovascular: negative Gastrointestinal: negative Genitourinary:negative Integument/breast: negative Musculoskeletal:negative Neurological: negative Endocrine: negative Allergic/Immunologic: sulfa   [facility] Nephrologist [place].. . Access LUA AVF.  Past Medical History  Diagnosis Date  . Anemia 03/06/2011  . Hypertension   . Coronary artery disease     PCI 2014, 2016  . Aortic stenosis, mild     severe aortic stenosis  . A-fib ([facility]  . Acute CHF ([facility]  . Hyperlipidemia   . Myocardial infarction ([place]  . Heart murmur   . Pneumonia 10/2014  . Anxiety   . ESRD (end stage renal disease) [place] dialysis ([place]    "[place]; [place]; [place]" (11/03/2014)  . Pseudoaneurysm following procedure ([facility]11/2014    s/p cardiac cath; closed with Vasc US[place]ompression    Past Surgical History  Procedure Laterality Date  . Av fistula placement Left ~ 2012    upper arm arm  . Forearm fracture surgery Bilateral     "fell in the basement"  . Fracture surgery    . Pseudoaneurysm repair Right 05/2013    leg  . Cataract extraction w/phaco Right 10/19/2013    Procedure: CATARACT EXTRACTION PHACO AND INTRAOCULAR LENS PLACEMENT (IOC);  Surgeon: MaElta Guadeloupe. ShGershon CraneMD;  Location: [facility] ORS;  Service: Ophthalmology;  Laterality: Right;  CDE:15.66  . Cataract extraction w/phaco Left 11/02/2013    Procedure: CATARACT EXTRACTION PHACO AND INTRAOCULAR LENS PLACEMENT (IOC);   Surgeon: MaElta Guadeloupe. ShGershon CraneMD;  Location: [facility] ORS;  Service: Ophthalmology;  Laterality: Left;  CDE:8.78  . Left and right heart catheterization with coronary angiogram N/A 05/11/2013    Procedure: LEFT AND RIGHT HEART CATHETERIZATION WITH CORONARY ANGIOGRAM;  Surgeon: JoLorretta HarpMD;  [facility];  Service: Cardiovascular;  Laterality: N/A;  . Percutaneous coronary rotoblator intervention (pci-r) N/A 05/14/2013    Procedure: PERCUTANEOUS CORONARY ROTOBLATOR INTERVENTION (PCI-R);  Surgeon: ThTroy SineMD;  [facility];  Service: Cardiovascular;  Laterality: N/A;  . Percutaneous coronary rotoblator intervention (pci-r) N/A 05/19/2013    Procedure: PERCUTANEOUS CORONARY ROTOBLATOR INTERVENTION (PCI-R);  Surgeon: ThTroy SineMD;  [facility];  Service: Cardiovascular;  Laterality: N/A;  . Cardiac catheterization  ; 11/03/2014  . Coronary angioplasty with stent placement    . Cataract extraction w/ intraocular lens  implant, bilateral Bilateral 2015  . Left and right heart catheterization with coronary angiogram N/A 11/03/2014    Procedure: LEFT AND RIGHT HEART CATHETERIZATION WITH CORONARY ANGIOGRAM;  Surgeon: ChBurnell BlanksMD;  [facility];  Service: Cardiovascular;  Laterality: N/A;    Family History  Problem Relation Age of Onset  . Heart attack Sister   . Hyperlipidemia Mother   . Stroke Father   . Diabetes Brother     Social History:  reports that she has never smoked. She has never used smokeless tobacco. She reports that she does not drink alcohol  or use illicit drugs.  Allergies:  Allergies  Allergen Reactions  . Sulfa Antibiotics     Medications:  I have reviewed the patient's current medications. Prior to Admission:  Prescriptions prior to admission  Medication Sig Dispense Refill Last Dose  . acetaminophen-codeine (TYLENOL #3) 300-30 MG tablet Take 1 tablet by mouth every 6 (six) hours as needed for moderate pain.    Past Week at Unknown time  . albuterol (PROVENTIL HFA;VENTOLIN HFA) 108 (90 BASE) MCG/ACT inhaler Inhale 2 puffs into the lungs every 4 (four) hours as needed for wheezing or shortness of breath. 1 Inhaler 0 unknown  . amiodarone (PACERONE) 200 MG tablet TAKE ONE TABLET BY MOUTH DAILY. 30 tablet 5 10/07/2015 at Unknown time  . darbepoetin (ARANESP) 100 MCG/0.5ML SOLN injection Inject 100 mcg into the skin See admin instructions. Takes every Monday, Wednesday and Friday with dialysis   10/06/2015 at Unknown time  . doxercalciferol (HECTOROL) 4 MCG/2ML injection Inject 3.5 mcg into the vein every Monday, Wednesday, and Friday with hemodialysis.   10/06/2015 at Unknown time  . HYDROcodone-acetaminophen (NORCO) 5-325 MG tablet Take 1 tablet by mouth 2 (two) times daily as needed for moderate pain.   10/07/2015 at Unknown time  . nitroGLYCERIN (NITROSTAT) 0.4 MG SL tablet Place 1 tablet (0.4 mg total) under the tongue every 5 (five) minutes as needed for chest pain. 25 tablet 2 unknown  . warfarin (COUMADIN) 1 MG tablet Take 1 1/2 tablets daily except 2 tablets on Mondays, Wednesdays and Fridays 60 tablet 2 10/06/2015 at 1800  . atorvastatin (LIPITOR) 40 MG tablet Take 1 tablet (40 mg total) by mouth daily at 6 PM. (Patient not taking: Reported on 10/07/2015) 30 tablet 2   . benzonatate (TESSALON) 100 MG capsule Take 1 capsule (100 mg total) by mouth 3 (three) times daily as needed for cough. (Patient not taking: Reported on 10/07/2015) 20 capsule 0   . isosorbide mononitrate (IMDUR) 30 MG 24 hr tablet Take 1 tablet (30 mg total) by mouth daily. (Patient not taking: Reported on 10/07/2015) 30 tablet 2     Results for orders placed or performed during the hospital encounter of 10/07/15 (from the past 48 hour(s))  CBC with Differential     Status: Abnormal   Collection Time: 10/07/15 12:27 PM  Result Value Ref Range   WBC 6.2 4.0 - 10.5 K/uL   RBC 4.05 3.87 - 5.11 MIL/uL   Hemoglobin 12.0 12.0 - 15.0 g/dL   HCT 37.1  36.0 - 46.0 %   MCV 91.6 78.0 - 100.0 fL   MCH 29.6 26.0 - 34.0 pg   MCHC 32.3 30.0 - 36.0 g/dL   RDW 17.2 (H) 11.5 - 15.5 %   Platelets 208 150 - 400 K/uL   Neutrophils Relative % 53 %   Neutro Abs 3.3 1.7 - 7.7 K/uL   Lymphocytes Relative 27 %   Lymphs Abs 1.7 0.7 - 4.0 K/uL   Monocytes Relative 10 %   Monocytes Absolute 0.6 0.1 - 1.0 K/uL   Eosinophils Relative 9 %   Eosinophils Absolute 0.6 0.0 - 0.7 K/uL   Basophils Relative 1 %   Basophils Absolute 0.0 0.0 - 0.1 K/uL  Comprehensive metabolic panel     Status: Abnormal   Collection Time: 10/07/15 12:27 PM  Result Value Ref Range   Sodium 139 135 - 145 mmol/L   Potassium 2.9 (L) 3.5 - 5.1 mmol/L   Chloride 101 101 - 111 mmol/L   CO2 27  22 - 32 mmol/L   Glucose, Bld 112 (H) 65 - 99 mg/dL   BUN 39 (H) 6 - 20 mg/dL   Creatinine, Ser 3.63 (H) 0.44 - 1.00 mg/dL   Calcium 7.6 (L) 8.9 - 10.3 mg/dL   Total Protein 6.3 (L) 6.5 - 8.1 g/dL   Albumin 3.0 (L) 3.5 - 5.0 g/dL   AST 14 (L) 15 - 41 U/L   ALT 7 (L) 14 - 54 U/L   Alkaline Phosphatase 63 38 - 126 U/L   Total Bilirubin 0.9 0.3 - 1.2 mg/dL   GFR calc non Af Amer 11 (L) >60 mL/min   GFR calc Af Amer 12 (L) >60 mL/min    Comment: (NOTE) The eGFR has been calculated using the CKD EPI equation. This calculation has not been validated in all clinical situations. eGFR's persistently <60 mL/min signify possible Chronic Kidney Disease.    Anion gap 11 5 - 15  Protime-INR     Status: Abnormal   Collection Time: 10/07/15 12:27 PM  Result Value Ref Range   Prothrombin Time 19.8 (H) 11.6 - 15.2 seconds   INR 1.68 (H) 0.00 - 2.94  Basic metabolic panel     Status: Abnormal   Collection Time: 10/08/15  3:00 AM  Result Value Ref Range   Sodium 139 135 - 145 mmol/L   Potassium 3.7 3.5 - 5.1 mmol/L   Chloride 103 101 - 111 mmol/L   CO2 22 22 - 32 mmol/L   Glucose, Bld 98 65 - 99 mg/dL   BUN 38 (H) 6 - 20 mg/dL   Creatinine, Ser 4.20 (H) 0.44 - 1.00 mg/dL   Calcium 7.6 (L) 8.9 -  10.3 mg/dL   GFR calc non Af Amer 9 (L) >60 mL/min   GFR calc Af Amer 10 (L) >60 mL/min    Comment: (NOTE) The eGFR has been calculated using the CKD EPI equation. This calculation has not been validated in all clinical situations. eGFR's persistently <60 mL/min signify possible Chronic Kidney Disease.    Anion gap 14 5 - 15  CBC     Status: Abnormal   Collection Time: 10/08/15  3:00 AM  Result Value Ref Range   WBC 7.1 4.0 - 10.5 K/uL   RBC 3.96 3.87 - 5.11 MIL/uL   Hemoglobin 11.2 (L) 12.0 - 15.0 g/dL   HCT 35.4 (L) 36.0 - 46.0 %   MCV 89.4 78.0 - 100.0 fL   MCH 28.3 26.0 - 34.0 pg   MCHC 31.6 30.0 - 36.0 g/dL   RDW 17.1 (H) 11.5 - 15.5 %   Platelets 180 150 - 400 K/uL  MRSA PCR Screening     Status: None   Collection Time: 10/08/15  4:45 AM  Result Value Ref Range   MRSA by PCR NEGATIVE NEGATIVE    Comment:        The GeneXpert MRSA Assay (FDA approved for NASAL specimens only), is one component of a comprehensive MRSA colonization surveillance program. It is not intended to diagnose MRSA infection nor to guide or monitor treatment for MRSA infections.     Dg Chest 2 View  10/07/2015  CLINICAL DATA:  Renal failure.  Preop evaluation. EXAM: CHEST  2 VIEW COMPARISON:  07/31/2015 and 10/13/2014 FINDINGS: Patient is slightly rotated to the right. Lungs are adequately inflated without focal consolidation or effusion. There is mild prominence of the central perihilar markings likely mild vascular congestion. There is moderate stable cardiomegaly. There is calcified plaque over the  thoracoabdominal aorta. There is diffuse osteopenia present. There are degenerative changes of the spine. Stable mild compression deformities over the mid to upper thoracic spine. IMPRESSION: Stable moderate cardiomegaly with evidence of mild vascular congestion. Stable mild compression deformities over the mid to upper thoracic spine. Electronically Signed   By: Marin Olp [name]   On: 10/07/2015 16:39    Dg Foot Complete Right  10/07/2015  CLINICAL DATA:  Pain between the first and second toes for 2 weeks. Renal failure, dialysis dependent. EXAM: RIGHT FOOT COMPLETE - 3+ VIEW COMPARISON:  12/07/2014 FINDINGS: Diffuse osteopenia evident. Chronic irregularity and bony deformity of the right fifth MTP joint and the right fifth proximal phalanx. Peripheral atherosclerosis evident. No acute osseous finding or fracture. Minor dorsal soft tissue swelling on the lateral view. IMPRESSION: Osteopenia and chronic deformity of the right fifth MTP joint and fifth proximal phalanx. Mild dorsal soft tissue swelling. No acute osseous finding by plain radiography Peripheral atherosclerosis Electronically Signed   By: Jerilynn Mages.  Shick [name]   On: 10/07/2015 12:32    ROS Blood pressure 89/42, pulse 71, temperature 98.4 F (36.9 C), temperature source Oral, resp. rate 14, height _0  (1.626 m), weight 47.219 kg (104 lb 1.6 oz), SpO2 97 %. Physical Exam Physical Examination: General appearance - thin, very elderly Mental status - alert, oriented to person, place, and time Eyes - pupils equal and reactive, extraocular eye movements intact, funduscopic exam normal, discs flat and sharp Mouth - mucous membranes moist, pharynx normal without lesions and edentulous Neck - adenopathy noted PCL Lymphatics - posterior cervical nodes Chest - rales noted bibasilar, decreased air entry noted bilat Heart - S1 and S2 normal, systolic murmur SW1/0 at 2nd left intercostal space and throughout the precordium, abdm, bilat fem bruits Abdomen - soft, nontender, nondistended, no masses or organomegaly Musculoskeletal - no joint tenderness, deformity or swelling Extremities - trophic changes distally.  R foot reddened distal 1/2 Skin - very thing  Assessment/Plan: 1 Ischemic feet R>L  willneed amp. Plans per [facility]VS 2 ESRD: will do Hd in am 3 AS 4. Anemia of ESRD: adequate 5. Metabolic Bone Disease: get records of meds 6 CAD 7 afib reg  now on [facility] P HD, get records, meds  [name] L 10/08/2015, 11:21 AM

## 2015-10-08 NOTE — Progress Notes (Signed)
ANTICOAGULATION CONSULT NOTE - Follow Up Consult  Pharmacy Consult for heparin Indication: no DP pulse R foot  Allergies  Allergen Reactions  . Sulfa Antibiotics     Patient Measurements: Height: 5\' 4"  (162.6 cm) Weight: 104 lb 1.6 oz (47.219 kg) IBW/kg (Calculated) : 54.7 Heparin Dosing Weight: 44.5 kg  Vital Signs: Temp: 98 F (36.7 C) (04/02 2220) Temp Source: Oral (04/02 2220) BP: 108/46 mmHg (04/02 2220) Pulse Rate: 73 (04/02 2220)  Labs:  Recent Labs  10/07/15 1227 10/08/15 0300 10/08/15 1207 10/08/15 2111  HGB 12.0 11.2*  --   --   HCT 37.1 35.4*  --   --   PLT 208 180  --   --   LABPROT 19.8*  --   --   --   INR 1.68*  --   --   --   HEPARINUNFRC  --   --  0.83* <0.10*  CREATININE 3.63* 4.20*  --   --     Estimated Creatinine Clearance: 7.3 mL/min (by C-G formula based on Cr of 4.2).  Assessment: [name] to [facility] w/ worsening R foot pain. No DP pulse in R foot. Heparin for afib on warfarin, INR 1.68 on admit. No surgery recommended per Vasc Surg.   Heparin level this evening resulted as SUBtherapeutic (HL<0.1 down from 0.83 earlier today). Per RN report - the drip has not been turned off and no bleeding issues have been noted.   PTA dose: 1.5mg  daily except 2mg  MWF (last dose pta 3/31)  Goal of Therapy:  Heparin level 0.3-0.7 units/ml Monitor platelets by anticoagulation protocol: Yes   Plan:  1. Increase heparin back to 600 units/hr (6 ml/hr) 2. Will continue to monitor for any signs/symptoms of bleeding and will follow up with heparin level in 8 hours   Brittany PillionElizabeth Xavia Beard, PharmD, BCPS Clinical Pharmacist Pager: [phone] 10/08/2015 10:26 PM

## 2015-10-08 NOTE — Progress Notes (Signed)
Pharmacy Antibiotic Note  Towana BadgerLottie V Rayle is a 82 y.o. female admitted on 10/07/2015 with wound infection.  Pharmacy has been consulted for vanc/zosyn dosing. Afeb, wbc wnl. No cultures. ESRD on HD MWF outpatient - to continue on schedule here, HD orders in for Monday.  Plan: Zosyn 2.25g IV q8h Vanc 1g x1; then 500mg  IV qHD MWF Monitor clinical progress, c/s, renal function, abx plan/LOT Pre-HD vanc lvl prn  Height: 5\' 4"  (162.6 cm) Weight: 104 lb 1.6 oz (47.219 kg) IBW/kg (Calculated) : 54.7  Temp (24hrs), Avg:98 F (36.7 C), Min:97.5 F (36.4 C), Max:98.4 F (36.9 C)   Recent Labs Lab 10/07/15 1227 10/08/15 0300  WBC 6.2 7.1  CREATININE 3.63* 4.20*    Estimated Creatinine Clearance: 7.3 mL/min (by C-G formula based on Cr of 4.2).    Allergies  Allergen Reactions  . Sulfa Antibiotics     Antimicrobials this admission: 4/1 vanc >>  4/2 zosyn >>   Dose adjustments this admission: n/a  Microbiology results:    Babs BertinHaley Myrl Lazarus, PharmD, Rex HospitalBCPS Clinical Pharmacist Pager [phone] 10/08/2015 12:51 PM

## 2015-10-08 NOTE — Progress Notes (Signed)
Subjective: Interval History: none.. Reports some pain in her right foot.  Objective: Vital signs in last 24 hours: Temp:  [97.5 F (36.4 C)-98.4 F (36.9 C)] 98.4 F (36.9 C) (04/02 0422) Pulse Rate:  [71-76] 71 (04/02 0422) Resp:  [11-16] 14 (04/02 0422) BP: (89-122)/(42-59) 89/42 mmHg (04/02 0422) SpO2:  [93 %-100 %] 97 % (04/02 0422) Weight:  [98 lb (44.453 kg)-104 lb 1.6 oz (47.219 kg)] 104 lb 1.6 oz (47.219 kg) (04/01 1936)  Intake/Output from previous day:   Intake/Output this shift:    Both feet warm. Motor and sensory intact in her feet bilaterally. Absent popliteal and distal pulses bilaterally.  Lab Results:  Recent Labs  10/07/15 1227 10/08/15 0300  WBC 6.2 7.1  HGB 12.0 11.2*  HCT 37.1 35.4*  PLT 208 180   BMET  Recent Labs  10/07/15 1227 10/08/15 0300  NA 139 139  K 2.9* 3.7  CL 101 103  CO2 27 22  GLUCOSE 112* 98  BUN 39* 38*  CREATININE 3.63* 4.20*  CALCIUM 7.6* 7.6*    Studies/Results: Dg Chest 2 View  10/07/2015  CLINICAL DATA:  Renal failure.  Preop evaluation. EXAM: CHEST  2 VIEW COMPARISON:  07/31/2015 and 10/13/2014 FINDINGS: Patient is slightly rotated to the right. Lungs are adequately inflated without focal consolidation or effusion. There is mild prominence of the central perihilar markings likely mild vascular congestion. There is moderate stable cardiomegaly. There is calcified plaque over the thoracoabdominal aorta. There is diffuse osteopenia present. There are degenerative changes of the spine. Stable mild compression deformities over the mid to upper thoracic spine. IMPRESSION: Stable moderate cardiomegaly with evidence of mild vascular congestion. Stable mild compression deformities over the mid to upper thoracic spine. Electronically Signed   By: Elberta Fortis [name]   On: 10/07/2015 16:39   Dg Foot Complete Right  10/07/2015  CLINICAL DATA:  Pain between the first and second toes for 2 weeks. Renal failure, dialysis dependent. EXAM:  RIGHT FOOT COMPLETE - 3+ VIEW COMPARISON:  12/07/2014 FINDINGS: Diffuse osteopenia evident. Chronic irregularity and bony deformity of the right fifth MTP joint and the right fifth proximal phalanx. Peripheral atherosclerosis evident. No acute osseous finding or fracture. Minor dorsal soft tissue swelling on the lateral view. IMPRESSION: Osteopenia and chronic deformity of the right fifth MTP joint and fifth proximal phalanx. Mild dorsal soft tissue swelling. No acute osseous finding by plain radiography Peripheral atherosclerosis Electronically Signed   By: Judie Petit.  Shick [name]   On: 10/07/2015 12:32   Anti-infectives: Anti-infectives    Start     Dose/Rate Route Frequency Ordered Stop   10/08/15 0200  piperacillin-tazobactam (ZOSYN) 2.25 g in dextrose 5 % 50 mL IVPB     2.25 g 100 mL/hr over 30 Minutes Intravenous Every 8 hours 10/07/15 1757     10/07/15 1800  piperacillin-tazobactam (ZOSYN) 2.25 g in dextrose 5 % 50 mL IVPB  Status:  Discontinued     2.25 g 100 mL/hr over 30 Minutes Intravenous 4 times per day 10/07/15 1609 10/07/15 1757   10/07/15 1743  piperacillin-tazobactam (ZOSYN) 3.375 GM/50ML IVPB  Status:  Discontinued    Comments:  Edwards, Christy   : cabinet override      10/07/15 1743 10/07/15 1758   10/07/15 1615  vancomycin (VANCOCIN) IVPB 1000 mg/200 mL premix     1,000 mg 200 mL/hr over 60 Minutes Intravenous  Once 10/07/15 1609 10/07/15 1742      Assessment/Plan: s/p * No surgery found * Had  a long discussion with the patient and her daughter present. I do not feel that this is related to acute change in her level of arterial insufficiency. Unclear as to the cause of her right foot pain. Explained that even if this was related to arterial insufficiency, would not recommend intervention or bypass. She is nonambulatory and is lift from bed to chair. Extremely high risk for surgery related to cardiac issues and renal failure and advanced age. Explained that if she did develop  critical ischemia would recommend primary amputation. We'll follow with you.   LOS: 1 day   Gretta Beganarly, Lennard Capek 10/08/2015, 9:02 AM

## 2015-10-08 NOTE — Progress Notes (Signed)
VASCULAR LAB PRELIMINARY  ARTERIAL  ABI completed:    RIGHT    LEFT    PRESSURE WAVEFORM  PRESSURE WAVEFORM  BRACHIAL 114 Triphasic BRACHIAL  Not evaluated- HD access and bandaging  DP 50 Dampened monophasic DP 121 Dampened monophasic  AT   AT    PT 61 Dampened monophasic PT Noncompressible Dampened monophasic  PER   PER    GREAT TOE  NA GREAT TOE  NA    RIGHT LEFT  ABI 0.54 1.06    The right ABI is suggestive of moderate, borderline severe arterial insufficiency at rest. The left ABI is within normal limits at rest, however this may be falsely elevated given abnormal waveforms.   10/08/2015 2:41 PM Brittany FeyMichelle Larone Beard, RVT, RDCS, RDMS

## 2015-10-09 ENCOUNTER — Encounter

## 2015-10-09 LAB — CBC
HEMATOCRIT: 34.7 % — AB (ref 36.0–46.0)
HEMATOCRIT: 35.6 % — AB (ref 36.0–46.0)
HEMOGLOBIN: 11.5 g/dL — AB (ref 12.0–15.0)
Hemoglobin: 11.8 g/dL — ABNORMAL LOW (ref 12.0–15.0)
MCH: 29.6 pg (ref 26.0–34.0)
MCH: 29.4 pg (ref 26.0–34.0)
MCHC: 33.1 g/dL (ref 30.0–36.0)
MCHC: 33.1 g/dL (ref 30.0–36.0)
MCV: 89.2 fL (ref 78.0–100.0)
MCV: 88.6 fL (ref 78.0–100.0)
Platelets: 188 10*3/uL (ref 150–400)
Platelets: 198 10*3/uL (ref 150–400)
RBC: 3.89 MIL/uL (ref 3.87–5.11)
RBC: 4.02 MIL/uL (ref 3.87–5.11)
RDW: 16.8 % — ABNORMAL HIGH (ref 11.5–15.5)
RDW: 16.7 % — AB (ref 11.5–15.5)
WBC: 7.2 10*3/uL (ref 4.0–10.5)
WBC: 7.5 10*3/uL (ref 4.0–10.5)

## 2015-10-09 LAB — HEPARIN LEVEL (UNFRACTIONATED)
HEPARIN UNFRACTIONATED: 0.18 [IU]/mL — AB (ref 0.30–0.70)
Heparin Unfractionated: 0.24 IU/mL — ABNORMAL LOW (ref 0.30–0.70)

## 2015-10-09 LAB — RENAL FUNCTION PANEL
ALBUMIN: 2.5 g/dL — AB (ref 3.5–5.0)
ANION GAP: 17 — AB (ref 5–15)
BUN: 49 mg/dL — ABNORMAL HIGH (ref 6–20)
CALCIUM: 8 mg/dL — AB (ref 8.9–10.3)
CO2: 18 mmol/L — ABNORMAL LOW (ref 22–32)
CREATININE: 5.34 mg/dL — AB (ref 0.44–1.00)
Chloride: 101 mmol/L (ref 101–111)
GFR, EST AFRICAN AMERICAN: 8 mL/min — AB (ref >60)
GFR, EST NON AFRICAN AMERICAN: 7 mL/min — AB (ref >60)
Glucose, Bld: 73 mg/dL (ref 65–99)
PHOSPHORUS: 7.3 mg/dL — AB (ref 2.5–4.6)
Potassium: 4.3 mmol/L (ref 3.5–5.1)
SODIUM: 136 mmol/L (ref 135–145)

## 2015-10-09 LAB — URIC ACID: URIC ACID, SERUM: 4.5 mg/dL (ref 2.3–6.6)

## 2015-10-09 LAB — PROTIME-INR
INR: 2.22 — AB (ref 0.00–1.49)
PROTHROMBIN TIME: 24.4 s — AB (ref 11.6–15.2)

## 2015-10-09 LAB — HEPATITIS B SURFACE ANTIGEN: Hepatitis B Surface Ag: NEGATIVE

## 2015-10-09 MED ORDER — ALTEPLASE 2 MG IJ SOLR: 2.0000 mg | Freq: Once | INTRAMUSCULAR | Status: DC | PRN

## 2015-10-09 MED ORDER — SODIUM CHLORIDE 0.9 % IV SOLN: 100.0000 mL | INTRAVENOUS | Status: DC | PRN

## 2015-10-09 MED ORDER — HEPARIN SODIUM (PORCINE) 1000 UNIT/ML DIALYSIS
100.0000 [IU]/kg | INTRAMUSCULAR | Status: DC | PRN
  Filled 2015-10-09: qty 5

## 2015-10-09 MED ORDER — WARFARIN - PHARMACIST DOSING INPATIENT: Freq: Every day | Status: DC

## 2015-10-09 MED ORDER — HEPARIN SODIUM (PORCINE) 1000 UNIT/ML DIALYSIS: 1000.0000 [IU] | INTRAMUSCULAR | Status: DC | PRN

## 2015-10-09 MED ORDER — LIDOCAINE-PRILOCAINE 2.5-2.5 % EX CREA
1.0000 "application " | TOPICAL_CREAM | CUTANEOUS | Status: DC | PRN
  Filled 2015-10-09: qty 5

## 2015-10-09 MED ORDER — WARFARIN SODIUM 3 MG PO TABS
3.0000 mg | ORAL_TABLET | Freq: Once | ORAL | Status: AC
  Administered 2015-10-09: 3 mg via ORAL
  Filled 2015-10-09 (×3): qty 1

## 2015-10-09 MED ORDER — MIDODRINE HCL 5 MG PO TABS
ORAL_TABLET | ORAL | Status: AC
  Filled 2015-10-09: qty 1

## 2015-10-09 MED ORDER — LIDOCAINE HCL (PF) 1 % IJ SOLN: 5.0000 mL | INTRAMUSCULAR | Status: DC | PRN

## 2015-10-09 MED ORDER — PENTAFLUOROPROP-TETRAFLUOROETH EX AERO: 1.0000 "application " | INHALATION_SPRAY | CUTANEOUS | Status: DC | PRN

## 2015-10-09 MED ORDER — OXYCODONE HCL 5 MG PO TABS
ORAL_TABLET | ORAL | Status: AC
  Filled 2015-10-09: qty 1

## 2015-10-09 NOTE — Progress Notes (Addendum)
[facility] Progress Note  Subjective    Right foot feels better. Patient seen in HD.   Objective Filed Vitals:   10/09/15 1000 10/09/15 1029  BP: 128/59 132/59  Pulse: 71 70  Temp:    Resp:      Intake/Output Summary (Last 24 hours) at 10/09/15 1103 Last data filed at 10/08/15 1815  Gross per 24 hour  Intake    480 ml  Output      0 ml  Net    480 ml    Right foot tender to touch.  No ischemic changes right foot.  Non palpable pedal pulses bilaterally.  Motor and sensory function intact to feet bilaterally.     RIGHT    LEFT    PRESSURE WAVEFORM  PRESSURE WAVEFORM  BRACHIAL 114 Triphasic BRACHIAL  Not evaluated- HD access and bandaging  DP 50 Dampened monophasic DP 121 Dampened monophasic  AT   AT    PT 61 Dampened monophasic PT Noncompressible Dampened monophasic  PER   PER    GREAT TOE  NA GREAT TOE  NA    RIGHT LEFT  ABI 0.54 1.06         Assessment/Planning: 82 y.o. female with right foot pain.   Right foot pain is better.  ABIs show moderate arterial insufficiency.  Given non ambulatory status and medical comorbidites, not a revascularization candidate.  If pain worsens, or if patient develops ischemic changes to foot, will require amputation.  Continue observation.    Raymond Gurney 10/09/2015 11:03 AM --  Laboratory CBC    Component Value Date/Time   WBC 7.5 10/09/2015 0746   HGB 11.8* 10/09/2015 0746   HCT 35.6* 10/09/2015 0746   PLT 198 10/09/2015 0746    BMET    Component Value Date/Time   NA 136 10/09/2015 0745   K 4.3 10/09/2015 0745   CL 101 10/09/2015 0745   CO2 18* 10/09/2015 0745   GLUCOSE 73 10/09/2015 0745   BUN 49* 10/09/2015 0745   CREATININE 5.34* 10/09/2015 0745   CALCIUM 8.0* 10/09/2015 0745   CALCIUM 8.3* 02/26/2011 1011   GFRNONAA 7* 10/09/2015 0745   GFRAA 8* 10/09/2015 0745    COAG Lab Results  Component Value Date   INR  1.68* 10/07/2015   INR 2.2 09/11/2015   INR 3.41* 08/02/2015   No results found for: PTT  Antibiotics Anti-infectives    Start     Dose/Rate Route Frequency Ordered Stop   10/09/15 1200  vancomycin (VANCOCIN) 500 mg in sodium chloride 0.9 % 100 mL IVPB     500 mg 100 mL/hr over 60 Minutes Intravenous Every M-W-F (Hemodialysis) 10/08/15 1252     10/08/15 0200  piperacillin-tazobactam (ZOSYN) 2.25 g in dextrose 5 % 50 mL IVPB     2.25 g 100 mL/hr over 30 Minutes Intravenous Every 8 hours 10/07/15 1757     10/07/15 1800  piperacillin-tazobactam (ZOSYN) 2.25 g in dextrose 5 % 50 mL IVPB  Status:  Discontinued     2.25 g 100 mL/hr over 30 Minutes Intravenous 4 times per day 10/07/15 1609 10/07/15 1757   10/07/15 1743  piperacillin-tazobactam (ZOSYN) 3.375 GM/50ML IVPB  Status:  Discontinued    Comments:  Edwards, Christy   : cabinet override      10/07/15 1743 10/07/15 1758   10/07/15 1615  vancomycin (VANCOCIN) IVPB 1000 mg/200 mL premix     1,000 mg 200 mL/hr over 60 Minutes Intravenous  Once 10/07/15 1609  10/07/15 1742       Maris BergerKimberly Trinh, PA-C [facility] Office: [phone] Pager: [phone] 10/09/2015 11:03 AM     I have examined the patient, reviewed and agree with above.Reports some pain in her right foot but this appears to be tolerable. No difficulty with hemodialysis today. Spoke with patient and multiple family members present. I am certainly not convinced that her pain is related to arterial insufficiency. She does have audible Doppler flow with an ankle arm index of 0.5 on the right. She does have dampened monophasic flow in the tibial vessels on both lower extremities. Her right foot is warm as is her left foot. Feel patient is stable for discharge at any time. I do not see any indication for ongoing antibiotics post hospitalization. No need for follow-up with us.  Gretta BeganEarly, Mega Kinkade, MD 10/09/2015 2:33 PM

## 2015-10-09 NOTE — Progress Notes (Signed)
TRH Progress Note                                                                                                                                                                                                                      Patient Demographics:    [name], is a 82 y.o. female, DOB - 25-Mar-1928, NWG:[id number]  Admit date - 10/07/2015   Admitting Physician Alba Cory, MD  Outpatient Primary MD for the patient is [name], MD  LOS - 2  Outpatient Specialists:   Chief Complaint  Patient presents with  . Foot Pain        Subjective:    [name] today has, No headache, No chest pain, No abdominal pain - No Nausea, No new weakness tingling or numbness, No Cough - SOB. Improved  R foot pain.   Assessment  & Plan :     1. Right foot ischemia with pain. Vascular surgery on board, trying to manage conservatively, clinically better, currently on empiric antibiotics along with heparin, will defer management to vascular surgery at this point. Likely will stop ABX soon, will check Uric acid as well.  2. ESRD. On Monday, Wednesday and Friday dialysis schedule. Renal consulted.  3. History of CAD with stent to RCA April 2016, also severe aortic stenosis. Currently compensated, recent echo reviewed, patient will be a high-risk candidate for any operative procedure this has been explained clearly to patient and her daughter. Both accept this.  4. Chronic borderline hypotension. Placed on midodrine will monitor.  5. Chronic A. fib. [place] vasc 2 score of greater than 5. Goal will be rate control, continue amiodarone, Coumadin + IV heparin bridge.  Lab Results  Component Value Date   INR 1.68* 10/07/2015   INR 2.2 09/11/2015   INR 3.41* 08/02/2015     Code Status : Full  Family Communication  : Daughter  Disposition Plan  : TBD  Barriers For  Discharge :   Consults  :  Renal, VVS  Procedures  :    TTE Jan 2017  Left ventricle: The cavity size was normal. Wall thickness was increased in a pattern of moderate LVH. Systolic function was normal. The estimated ejection fraction was in the range of 55% to 60%. - Aortic valve: There was severe stenosis. There was mild regurgitation. Valve area (VTI): 0.54 cm^2. Valve area (Vmax): 0.48 cm^2. Valve area (Vmean): 0.43 cm^2. - Mitral valve: Calcified annulus. There was moderate regurgitation. Valve area by continuity  equation (using LVOT  flow): 1.14 cm^2. - Left atrium: The atrium was severely dilated. - Right atrium: The atrium was mildly dilated. - Atrial septum: No defect or patent foramen ovale was identified. - Tricuspid valve: There was moderate regurgitation. - Pulmonary arteries: PA peak pressure: 59 mm Hg (S).    ABI - The right ABI is suggestive of moderate, borderline severe arterial insufficiency at rest. The left ABI is within normal limits at rest, however this may be falsely elevated given abnormal waveforms.   DVT Prophylaxis  :  Heparin    Lab Results  Component Value Date   PLT 198 10/09/2015    Antibiotics  :    Anti-infectives    Start     Dose/Rate Route Frequency Ordered Stop   10/09/15 1200  vancomycin (VANCOCIN) 500 mg in sodium chloride 0.9 % 100 mL IVPB     500 mg 100 mL/hr over 60 Minutes Intravenous Every M-W-F (Hemodialysis) 10/08/15 1252     10/08/15 0200  piperacillin-tazobactam (ZOSYN) 2.25 g in dextrose 5 % 50 mL IVPB     2.25 g 100 mL/hr over 30 Minutes Intravenous Every 8 hours 10/07/15 1757     10/07/15 1800  piperacillin-tazobactam (ZOSYN) 2.25 g in dextrose 5 % 50 mL IVPB  Status:  Discontinued     2.25 g 100 mL/hr over 30 Minutes Intravenous 4 times per day 10/07/15 1609 10/07/15 1757   10/07/15 1743  piperacillin-tazobactam (ZOSYN) 3.375 GM/50ML IVPB  Status:  Discontinued    Comments:  Edwards, Christy   : cabinet override       10/07/15 1743 10/07/15 1758   10/07/15 1615  vancomycin (VANCOCIN) IVPB 1000 mg/200 mL premix     1,000 mg 200 mL/hr over 60 Minutes Intravenous  Once 10/07/15 1609 10/07/15 1742        Objective:   Filed Vitals:   10/09/15 0725 10/09/15 0800 10/09/15 0830 10/09/15 0901  BP: 109/49 114/51 119/56 110/52  Pulse: 67 68 69 68  Temp:      TempSrc:      Resp:      Height:      Weight:      SpO2:        Wt Readings from Last 3 Encounters:  10/09/15 47.2 kg (104 lb 0.9 oz)  08/02/15 44.6 kg (98 lb 5.2 oz)  12/07/14 51.71 kg (114 lb)     Intake/Output Summary (Last 24 hours) at 10/09/15 1002 Last data filed at 10/08/15 1815  Gross per 24 hour  Intake    480 ml  Output      0 ml  Net    480 ml     Physical Exam  Awake Alert, Oriented X 3, No new F.N deficits, Normal affect [place].AT,PERRAL Supple Neck,No JVD, No cervical lymphadenopathy appriciated.  Symmetrical Chest wall movement, Good air movement bilaterally, CTAB RRR,No Gallops,Rubs, positive aortic systolic murmur, No Parasternal Heave +ve B.Sounds, Abd Soft, No tenderness, No organomegaly appriciated, No rebound - guarding or rigidity. No Cyanosis, Clubbing or edema, No new Rash or bruise,  R foot mildly tender today    Data Review:    CBC  Recent Labs Lab 10/07/15 1227 10/08/15 0300 10/09/15 0343 10/09/15 0746  WBC 6.2 7.1 7.2 7.5  HGB 12.0 11.2* 11.5* 11.8*  HCT 37.1 35.4* 34.7* 35.6*  PLT 208 180 188 198  MCV 91.6 89.4 89.2 88.6  MCH 29.6 28.3 29.6 29.4  MCHC 32.3 31.6 33.1 33.1  RDW 17.2* 17.1* 16.8*  16.7*  LYMPHSABS 1.7  --   --   --   MONOABS 0.6  --   --   --   EOSABS 0.6  --   --   --   BASOSABS 0.0  --   --   --     Chemistries   Recent Labs Lab 10/07/15 1227 10/08/15 0300 10/09/15 0745  NA 139 139 136  K 2.9* 3.7 4.3  CL 101 103 101  CO2 27 22 18*  GLUCOSE 112* 98 73  BUN 39* 38* 49*  CREATININE 3.63* 4.20* 5.34*  CALCIUM 7.6* 7.6* 8.0*  AST 14*  --   --   ALT 7*  --   --     ALKPHOS 63  --   --   BILITOT 0.9  --   --    ------------------------------------------------------------------------------------------------------------------ No results for input(s): CHOL, HDL, LDLCALC, TRIG, CHOLHDL, LDLDIRECT in the last 72 hours.  Lab Results  Component Value Date   HGBA1C 5.8* 10/13/2014   ------------------------------------------------------------------------------------------------------------------ No results for input(s): TSH, T4TOTAL, T3FREE, THYROIDAB in the last 72 hours.  Invalid input(s): FREET3 ------------------------------------------------------------------------------------------------------------------ No results for input(s): VITAMINB12, FOLATE, FERRITIN, TIBC, IRON, RETICCTPCT in the last 72 hours.  Coagulation profile  Recent Labs Lab 10/07/15 1227  INR 1.68*    No results for input(s): DDIMER in the last 72 hours.  Cardiac Enzymes No results for input(s): CKMB, TROPONINI, MYOGLOBIN in the last 168 hours.  Invalid input(s): CK ------------------------------------------------------------------------------------------------------------------    Component Value Date/Time   BNP >4500.0* 07/31/2015 1535    Inpatient Medications  Scheduled Meds: . amiodarone  200 mg Oral Daily  . atorvastatin  40 mg Oral q1800  . docusate sodium  100 mg Oral BID  . midodrine      . midodrine  5 mg Oral TID WC  . multivitamin  1 tablet Oral QHS  . oxyCODONE      . piperacillin-tazobactam (ZOSYN) IVPB  2.25 g Intravenous Q8H  . sodium chloride flush  3 mL Intravenous Q12H  . vancomycin  500 mg Intravenous Q M,W,F-HD   Continuous Infusions: . heparin 700 Units/hr (10/09/15 0855)   PRN Meds:.sodium chloride, sodium chloride, acetaminophen **OR** acetaminophen, albuterol, alteplase, heparin, heparin, HYDROmorphone (DILAUDID) injection, lidocaine (PF), lidocaine-prilocaine, morphine injection, nitroGLYCERIN, [DISCONTINUED] ondansetron **OR**  ondansetron (ZOFRAN) IV, oxyCODONE, pentafluoroprop-tetrafluoroeth  Micro Results Recent Results (from the past 240 hour(s))  MRSA PCR Screening     Status: None   Collection Time: 10/08/15  4:45 AM  Result Value Ref Range Status   MRSA by PCR NEGATIVE NEGATIVE Final    Comment:        The GeneXpert MRSA Assay (FDA approved for NASAL specimens only), is one component of a comprehensive MRSA colonization surveillance program. It is not intended to diagnose MRSA infection nor to guide or monitor treatment for MRSA infections.     Radiology Reports Dg Chest 2 View  10/07/2015  CLINICAL DATA:  Renal failure.  Preop evaluation. EXAM: CHEST  2 VIEW COMPARISON:  07/31/2015 and 10/13/2014 FINDINGS: Patient is slightly rotated to the right. Lungs are adequately inflated without focal consolidation or effusion. There is mild prominence of the central perihilar markings likely mild vascular congestion. There is moderate stable cardiomegaly. There is calcified plaque over the thoracoabdominal aorta. There is diffuse osteopenia present. There are degenerative changes of the spine. Stable mild compression deformities over the mid to upper thoracic spine. IMPRESSION: Stable moderate cardiomegaly with evidence of mild vascular congestion. Stable mild compression deformities  over the mid to upper thoracic spine. Electronically Signed   By: Elberta Fortis [name]   On: 10/07/2015 16:39   Dg Foot Complete Right  10/07/2015  CLINICAL DATA:  Pain between the first and second toes for 2 weeks. Renal failure, dialysis dependent. EXAM: RIGHT FOOT COMPLETE - 3+ VIEW COMPARISON:  12/07/2014 FINDINGS: Diffuse osteopenia evident. Chronic irregularity and bony deformity of the right fifth MTP joint and the right fifth proximal phalanx. Peripheral atherosclerosis evident. No acute osseous finding or fracture. Minor dorsal soft tissue swelling on the lateral view. IMPRESSION: Osteopenia and chronic deformity of the right fifth  MTP joint and fifth proximal phalanx. Mild dorsal soft tissue swelling. No acute osseous finding by plain radiography Peripheral atherosclerosis Electronically Signed   By: Judie Petit.  Shick [name]   On: 10/07/2015 12:32    Time Spent in minutes   30   Lyrika Souders K M.D on 10/09/2015 at 10:02 AM  Between 7am to 7pm - Pager - [phone]  After 7pm go to [email or web address] - password [facility]  [facility] -  Office  [phone]

## 2015-10-09 NOTE — Progress Notes (Signed)
S:Says less pain in Rt foot O:BP 108/46 mmHg  Pulse 73  Temp(Src) 98 F (36.7 C) (Oral)  Resp 16  Ht  (1.626 m)  Wt 47.219 kg (104 lb 1.6 oz)  BMI 17.86 kg/m2  SpO2 99%  Intake/Output Summary (Last 24 hours) at 10/09/15 0641 Last data filed at 10/08/15 1815  Gross per 24 hour  Intake    480 ml  Output      0 ml  Net    480 ml   Weight change:  ZOX:WRUEA and alert CVS: RRR 3/6 systolic M Resp: Few bibasilar crackles Abd: + BS NTND Ext: small amt edema Rt foot, erythema of distal Rt foot.  LUA AVF + bruit NEURO: CNI Ox3  No asterixis   . amiodarone  200 mg Oral Daily  . atorvastatin  40 mg Oral q1800  . docusate sodium  100 mg Oral BID  . midodrine  5 mg Oral TID WC  . multivitamin  1 tablet Oral QHS  . piperacillin-tazobactam (ZOSYN) IVPB  2.25 g Intravenous Q8H  . sodium chloride flush  3 mL Intravenous Q12H  . sodium chloride flush  3 mL Intravenous Q12H  . vancomycin  500 mg Intravenous Q M,W,F-HD   Dg Chest 2 View  10/07/2015  CLINICAL DATA:  Renal failure.  Preop evaluation. EXAM: CHEST  2 VIEW COMPARISON:  07/31/2015 and 10/13/2014 FINDINGS: Patient is slightly rotated to the right. Lungs are adequately inflated without focal consolidation or effusion. There is mild prominence of the central perihilar markings likely mild vascular congestion. There is moderate stable cardiomegaly. There is calcified plaque over the thoracoabdominal aorta. There is diffuse osteopenia present. There are degenerative changes of the spine. Stable mild compression deformities over the mid to upper thoracic spine. IMPRESSION: Stable moderate cardiomegaly with evidence of mild vascular congestion. Stable mild compression deformities over the mid to upper thoracic spine. Electronically Signed   By: Elberta Fortis [name]   On: 10/07/2015 16:39   Dg Foot Complete Right  10/07/2015  CLINICAL DATA:  Pain between the first and second toes for 2 weeks. Renal failure, dialysis dependent. EXAM: RIGHT  FOOT COMPLETE - 3+ VIEW COMPARISON:  12/07/2014 FINDINGS: Diffuse osteopenia evident. Chronic irregularity and bony deformity of the right fifth MTP joint and the right fifth proximal phalanx. Peripheral atherosclerosis evident. No acute osseous finding or fracture. Minor dorsal soft tissue swelling on the lateral view. IMPRESSION: Osteopenia and chronic deformity of the right fifth MTP joint and fifth proximal phalanx. Mild dorsal soft tissue swelling. No acute osseous finding by plain radiography Peripheral atherosclerosis Electronically Signed   By: Judie Petit.  Shick [name]   On: 10/07/2015 12:32   BMET    Component Value Date/Time   NA 139 10/08/2015 0300   K 3.7 10/08/2015 0300   CL 103 10/08/2015 0300   CO2 22 10/08/2015 0300   GLUCOSE 98 10/08/2015 0300   BUN 38* 10/08/2015 0300   CREATININE 4.20* 10/08/2015 0300   CALCIUM 7.6* 10/08/2015 0300   CALCIUM 8.3* 02/26/2011 1011   GFRNONAA 9* 10/08/2015 0300   GFRAA 10* 10/08/2015 0300   CBC    Component Value Date/Time   WBC 7.2 10/09/2015 0343   RBC 3.89 10/09/2015 0343   RBC 3.44* 05/10/2013 0910   HGB 11.5* 10/09/2015 0343   HCT 34.7* 10/09/2015 0343   PLT 188 10/09/2015 0343   MCV 89.2 10/09/2015 0343   MCH 29.6 10/09/2015 0343   MCHC 33.1 10/09/2015 0343   RDW 16.8*  10/09/2015 0343   LYMPHSABS 1.7 10/07/2015 1227   MONOABS 0.6 10/07/2015 1227   EOSABS 0.6 10/07/2015 1227   BASOSABS 0.0 10/07/2015 1227     Assessment:  1. ESRD MWF [facility] 2. Anemia 3.  Hx A fib, now in NSR 4. AS 5. Low BP on midodrine   Plan: 1. HD today 2. Get info from her Dx unit 3. Cont AB   [name] T

## 2015-10-09 NOTE — Progress Notes (Addendum)
ANTICOAGULATION CONSULT NOTE - Follow Up Consult  Pharmacy Consult for heparin Indication: no DP pulse R foot and atrial fibrillation  Allergies  Allergen Reactions  . Sulfa Antibiotics     Patient Measurements: Height: 5\' 4"  (162.6 cm) Weight: 104 lb 0.9 oz (47.2 kg) IBW/kg (Calculated) : 54.7 Heparin Dosing Weight: 44.5 kg  Vital Signs: Temp: 97 F (36.1 C) (04/03 0720) Temp Source: Oral (04/03 0720) BP: 109/49 mmHg (04/03 0725) Pulse Rate: 67 (04/03 0725)  Labs:  Recent Labs  10/07/15 1227 10/08/15 0300 10/08/15 1207 10/08/15 2111 10/09/15 0343 10/09/15 0745 10/09/15 0746  HGB 12.0 11.2*  --   --  11.5*  --  11.8*  HCT 37.1 35.4*  --   --  34.7*  --  35.6*  PLT 208 180  --   --  188  --  198  LABPROT 19.8*  --   --   --   --   --   --   INR 1.68*  --   --   --   --   --   --   HEPARINUNFRC  --   --  0.83* <0.10*  --   --  0.18*  CREATININE 3.63* 4.20*  --   --   --  5.34*  --     Estimated Creatinine Clearance: 5.7 mL/min (by C-G formula based on Cr of 5.34).  Assessment: 87 yof to AP w/ worsening R foot pain. No DP pulse in R foot. Heparin for afib on warfarin PTA, INR 1.68 on admit. No surgery recommended per Vasc Surg.   Heparin level this morning resulted as SUBtherapeutic (HL 0.18). No bleeding issues have been noted.   PTA dose: 1.5mg  daily except 2mg  MWF (last dose pta 3/31)  Goal of Therapy:  Heparin level 0.3-0.7 units/ml Monitor platelets by anticoagulation protocol: Yes   Plan:  1. Increase heparin drip to 700 units/hr (7 ml/hr) 2. Will continue to monitor for any signs/symptoms of bleeding  3. 8 hr HL 4. Daily HL and CBC  5. Please advise when safe to resume warfarin  [facility] PasoJennifer [place], [address] BoulevardPharm.D., BCPS Clinical Pharmacist Pager: [phone] 10/09/2015 8:34 AM     Addendum: Resuming warfarin. INR 1.68 on 4/1 but has not had warfarin since 3/31.  PTA: 1.5 mg daily except 2 mg on MWF  Plan: Warfarin 3 mg PO tonight INR daily  [facility]Jennifer  [place], [address]Pharm[address]D., BCPS Clinical Pharmacist Pager: [phone] 10/09/2015 11:46 AM

## 2015-10-09 NOTE — Progress Notes (Signed)
Pt back from HD to room; VSS; pt A&O x4; family at bedside and call light within reach. Will closely monitor pt. Dionne BucyP. Amo Tellis Spivak RN

## 2015-10-10 LAB — CBC
HCT: 34.9 % — ABNORMAL LOW (ref 36.0–46.0)
Hemoglobin: 11 g/dL — ABNORMAL LOW (ref 12.0–15.0)
MCH: 28 pg (ref 26.0–34.0)
MCHC: 31.5 g/dL (ref 30.0–36.0)
MCV: 88.8 fL (ref 78.0–100.0)
PLATELETS: 117 10*3/uL — AB (ref 150–400)
RBC: 3.93 MIL/uL (ref 3.87–5.11)
RDW: 17 % — AB (ref 11.5–15.5)
WBC: 6.4 10*3/uL (ref 4.0–10.5)

## 2015-10-10 LAB — PROTIME-INR
INR: 2 — ABNORMAL HIGH (ref 0.00–1.49)
PROTHROMBIN TIME: 22.6 s — AB (ref 11.6–15.2)

## 2015-10-10 MED ORDER — WARFARIN SODIUM 3 MG PO TABS: 1.5000 mg | ORAL_TABLET | Freq: Once | ORAL | Status: DC

## 2015-10-10 MED ORDER — HYDROCODONE-ACETAMINOPHEN 5-325 MG PO TABS: 1.0000 | ORAL_TABLET | Freq: Two times a day (BID) | ORAL | Status: AC | PRN

## 2015-10-10 MED ORDER — GABAPENTIN 300 MG PO CAPS: 300.0000 mg | ORAL_CAPSULE | Freq: Three times a day (TID) | ORAL | Status: AC

## 2015-10-10 MED ORDER — DOXERCALCIFEROL 4 MCG/2ML IV SOLN: 0.5000 ug | INTRAVENOUS | Status: DC

## 2015-10-10 MED ORDER — SODIUM BICARBONATE 650 MG PO TABS
650.0000 mg | ORAL_TABLET | Freq: Two times a day (BID) | ORAL | Status: DC
  Administered 2015-10-10: 650 mg via ORAL
  Filled 2015-10-10: qty 1

## 2015-10-10 MED ORDER — SEVELAMER CARBONATE 800 MG PO TABS
1600.0000 mg | ORAL_TABLET | Freq: Three times a day (TID) | ORAL | Status: DC
  Administered 2015-10-10: 1600 mg via ORAL
  Filled 2015-10-10: qty 2

## 2015-10-10 NOTE — Evaluation (Signed)
Physical Therapy Evaluation Patient Details Name: Brittany BadgerLottie V Beard MRN: [id number] DOB: 10/28/1927 Today's Date: 10/10/2015   History of Present Illness  pt presents with Ischemic R Foot.  pt with hx of ESRD on HD, MI, CAD, Aortic Stenosis, A-fib, HTN, CHF, Anxiety, and Cataract Extraction.    Clinical Impression  Pt appears to be near baseline level of mobility as she required A for all transfers PTA.  Pt and daughter indicate they have all needed DME at home and no needs for PT at this time.  Feel pt has great support from family and Aide, and is appropriate for return to home.  No further PT needs at this time.      Follow Up Recommendations No PT follow up;Supervision/Assistance - 24 hour    Equipment Recommendations  None recommended by PT    Recommendations for Other Services       Precautions / Restrictions Precautions Precautions: Fall Restrictions Weight Bearing Restrictions: No      Mobility  Bed Mobility Overal bed mobility: Needs Assistance Bed Mobility: Supine to Sit     Supine to sit: Min assist     General bed mobility comments: pt moving well and only needs A for scooting her hips closer to EOB and maintaining balance while she brought herself to sitting.  Transfers Overall transfer level: Needs assistance Equipment used: 1 person hand held assist Transfers: Squat Pivot Transfers     Squat pivot transfers: Mod assist     General transfer comment: pt demonstrates great technique utilizing UEs on arm of recliner and weightbearing through L LE.  Squat pivoted towards L side to recliner.    Ambulation/Gait                Stairs            Wheelchair Mobility    Modified Rankin (Stroke Patients Only)       Balance Overall balance assessment: Needs assistance Sitting-balance support: No upper extremity supported;Feet supported Sitting balance-Leahy Scale: Fair     Standing balance support: Bilateral upper extremity supported;During  functional activity Standing balance-Leahy Scale: Poor                               Pertinent Vitals/Pain Pain Assessment: Faces Faces Pain Scale: Hurts little more Pain Location: R Foot Pain Descriptors / Indicators: Aching Pain Intervention(s): Monitored during session;Premedicated before session;Repositioned    Home Living Family/patient expects to be discharged to:: Private residence Living Arrangements: Children Available Help at Discharge: Family;Personal care attendant;Available 24 hours/day Type of Home: House Home Access: Ramped entrance     Home Layout: One level Home Equipment: Bedside commode;Wheelchair - Engineer, technical salesmanual;Wheelchair - power;Hospital bed Additional Comments: Between family and Aide pt has 24hr A.    Prior Function Level of Independence: Needs assistance   Gait / Transfers Assistance Needed: Only transfers with A.  ADL's / Homemaking Assistance Needed: Family and Aide A with all ADLs and perform homemaking tasks.          Hand Dominance        Extremity/Trunk Assessment   Upper Extremity Assessment: Generalized weakness           Lower Extremity Assessment: Generalized weakness;RLE deficits/detail RLE Deficits / Details: R foot painful limiting ability to weightbear.    Cervical / Trunk Assessment: Kyphotic  Communication   Communication: No difficulties  Cognition Arousal/Alertness: Awake/alert Behavior During Therapy: WFL for tasks assessed/performed Overall Cognitive  Status: Within Functional Limits for tasks assessed                      General Comments      Exercises        Assessment/Plan    PT Assessment Patent does not need any further PT services  PT Diagnosis Generalized weakness   PT Problem List    PT Treatment Interventions     PT Goals (Current goals can be found in the Care Plan section) Acute Rehab PT Goals Patient Stated Goal: Per family and pt to return to home ASAP. PT Goal  Formulation: All assessment and education complete, DC therapy    Frequency     Barriers to discharge        Co-evaluation               End of Session Equipment Utilized During Treatment: Gait belt Activity Tolerance: Patient tolerated treatment well Patient left: in chair;with call bell/phone within reach;with family/visitor present Nurse Communication: Mobility status         Time: 1610-9604 PT Time Calculation (min) (ACUTE ONLY): 26 min   Charges:   PT Evaluation $PT Eval Moderate Complexity: 1 Procedure     PT G CodesSunny Schlein, [place] [phone] 10/10/2015, 11:21 AM

## 2015-10-10 NOTE — Progress Notes (Signed)
ANTICOAGULATION CONSULT NOTE - Follow Up Consult  Pharmacy Consult for coumadin Indication: atrial fibrillation  Allergies  Allergen Reactions  . Sulfa Antibiotics     Patient Measurements: Height: 5\' 4"  (162.6 cm) Weight: 105 lb 13.1 oz (48 kg) IBW/kg (Calculated) : 54.7 Heparin Dosing Weight: 44.5 kg  Vital Signs: Temp: 97.4 F (36.3 C) (04/04 0830) Temp Source: Oral (04/04 0830) BP: 117/50 mmHg (04/04 0830) Pulse Rate: 67 (04/04 0830)  Labs:  Recent Labs  10/07/15 1227 10/08/15 0300  10/08/15 2111 10/09/15 0343 10/09/15 0745 10/09/15 0746 10/09/15 1506 10/10/15 0319  HGB 12.0 11.2*  --   --  11.5*  --  11.8*  --  11.0*  HCT 37.1 35.4*  --   --  34.7*  --  35.6*  --  34.9*  PLT 208 180  --   --  188  --  198  --  117*  LABPROT 19.8*  --   --   --   --   --   --  24.4* 22.6*  INR 1.68*  --   --   --   --   --   --  2.22* 2.00*  HEPARINUNFRC  --   --   < > <0.10*  --   --  0.18* 0.24*  --   CREATININE 3.63* 4.20*  --   --   --  5.34*  --   --   --   < > = values in this interval not displayed.  Estimated Creatinine Clearance: 5.8 mL/min (by C-G formula based on Cr of 5.34).  Assessment: 87 yof to AP w/ worsening R foot pain. She in on warfarin PTA, INR 1.68 on admit. No surgery recommended per Vasc Surg.   -INR today= 2.0  PTA dose: 1.5mg  daily except 2mg  MWF (last dose pta 3/31)  Goal of Therapy:  Heparin level 0.3-0.7 units/ml Monitor platelets by anticoagulation protocol: Yes   Plan:  -Coumadin 1.5mg  po today -Daily PT/INR  Harland GermanAndrew Danni Leabo, Pharm D 10/10/2015 9:04 AM

## 2015-10-10 NOTE — Discharge Instructions (Signed)
Follow with Primary MD Evlyn CourierGerald K Hill, MD in 2-3 days   Get CBC, CMP, INR, 2 view Chest X ray checked  by Primary MD next visit.    Activity: As tolerated with Full fall precautions use walker/cane & assistance as needed   Disposition Home     Diet:  Renal - Check your Weight same time everyday, if you gain over 2 pounds, or you develop in leg swelling, experience more shortness of breath or chest pain, call your Primary MD immediately. Follow Cardiac Low Salt Diet and 1.2 lit/day fluid restriction.   On your next visit with your primary care physician please Get Medicines reviewed and adjusted.   Please request your Prim.MD to go over all Hospital Tests and Procedure/Radiological results at the follow up, please get all Hospital records sent to your Prim MD by signing hospital release before you go home.   If you experience worsening of your admission symptoms, develop shortness of breath, life threatening emergency, suicidal or homicidal thoughts you must seek medical attention immediately by calling 911 or calling your MD immediately  if symptoms less severe.  You Must read complete instructions/literature along with all the possible adverse reactions/side effects for all the Medicines you take and that have been prescribed to you. Take any new Medicines after you have completely understood and accpet all the possible adverse reactions/side effects.   Do not drive, operating heavy machinery, perform activities at heights, swimming or participation in water activities or provide baby sitting services if your were admitted for syncope or siezures until you have seen by Primary MD or a Neurologist and advised to do so again.  Do not drive when taking Pain medications.    Do not take more than prescribed Pain, Sleep and Anxiety Medications  Special Instructions: If you have smoked or chewed Tobacco  in the last 2 yrs please stop smoking, stop any regular Alcohol  and or any Recreational  drug use.  Wear Seat belts while driving.   Please note  You were cared for by a hospitalist during your hospital stay. If you have any questions about your discharge medications or the care you received while you were in the hospital after you are discharged, you can call the unit and asked to speak with the hospitalist on call if the hospitalist that took care of you is not available. Once you are discharged, your primary care physician will handle any further medical issues. Please note that NO REFILLS for any discharge medications will be authorized once you are discharged, as it is imperative that you return to your primary care physician (or establish a relationship with a primary care physician if you do not have one) for your aftercare needs so that they can reassess your need for medications and monitor your lab values.

## 2015-10-10 NOTE — Discharge Summary (Signed)
[name], is a 82 y.o. female  DOB 02/17/1928  MRN [id number].  Admission date:  10/07/2015  Admitting Physician  Alba Cory, MD  Discharge Date:  10/10/2015   Primary MD  [name], MD  Recommendations for primary care physician for things to follow:   Monitor clinically for right foot pain likely neuropathic. If declines further focus on comfort.   Admission Diagnosis  Right foot pain [M79.671] PAD (peripheral artery disease) (HCC) [I73.9] Subtherapeutic international normalized ratio (INR) [R79.1] Pre-op chest exam [Z01.811] Cold right foot [R20.8]   Discharge Diagnosis  Right foot pain [M79.671] PAD (peripheral artery disease) (HCC) [I73.9] Subtherapeutic international normalized ratio (INR) [R79.1] Pre-op chest exam [Z01.811] Cold right foot [R20.8]    Principal Problem:   Ischemic foot Active Problems:   Anemia   ESRD (end stage renal disease) on dialysis (HCC)   PAF (paroxysmal atrial fibrillation)- Amiodarone - NSR   Severe aortic stenosis   Right foot pain   Ischemic pain of right foot      Past Medical History  Diagnosis Date  . Anemia 03/06/2011  . Hypertension   . Coronary artery disease     PCI 2014, 2016  . Aortic stenosis, mild     severe aortic stenosis  . A-fib (HCC)   . Acute CHF (HCC)   . Hyperlipidemia   . Myocardial infarction (HCC) 2014  . Heart murmur   . Pneumonia 10/2014  . Anxiety   . ESRD (end stage renal disease) on dialysis (HCC)     "MWF; [facility]; [place]" (11/03/2014)  . Pseudoaneurysm following procedure (HCC) 05/2013    s/p cardiac cath; closed with Vasc US compression    Past Surgical History  Procedure Laterality Date  . Av fistula placement Left ~ 2012    upper arm arm  . Forearm fracture surgery Bilateral     "fell in the basement"    . Fracture surgery    . Pseudoaneurysm repair Right 05/2013    leg  . Cataract extraction w/phaco Right 10/19/2013    Procedure: CATARACT EXTRACTION PHACO AND INTRAOCULAR LENS PLACEMENT (IOC);  Surgeon: Loraine Leriche T. Nile Riggs, MD;  Location: [facility];  Service: Ophthalmology;  Laterality: Right;  CDE:15.66  . Cataract extraction w/phaco Left 11/02/2013    Procedure: CATARACT EXTRACTION PHACO AND INTRAOCULAR LENS PLACEMENT (IOC);  Surgeon: Loraine Leriche T. Nile Riggs, MD;  Location: [facility];  Service: Ophthalmology;  Laterality: Left;  CDE:8.78  . Left and right heart catheterization with coronary angiogram N/A 05/11/2013    Procedure: LEFT AND RIGHT HEART CATHETERIZATION WITH CORONARY ANGIOGRAM;  Surgeon: Runell Gess, MD;  Location: [facility] CATH LAB;  Service: Cardiovascular;  Laterality: N/A;  . Percutaneous coronary rotoblator intervention (pci-r) N/A 05/14/2013    Procedure: PERCUTANEOUS CORONARY ROTOBLATOR INTERVENTION (PCI-R);  Surgeon: Lennette Bihari, MD;  Location: [facility] CATH LAB;  Service: Cardiovascular;  Laterality: N/A;  . Percutaneous coronary rotoblator intervention (pci-r) N/A 05/19/2013    Procedure: PERCUTANEOUS CORONARY ROTOBLATOR INTERVENTION (PCI-R);  Surgeon: Lennette Bihari, MD;  Location: [facility] CATH LAB;  Service: Cardiovascular;  Laterality: N/A;  . Cardiac catheterization  ; 11/03/2014  . Coronary angioplasty with stent placement    . Cataract extraction w/ intraocular lens  implant, bilateral Bilateral 2015  . Left and right heart catheterization with coronary angiogram N/A 11/03/2014    Procedure: LEFT AND RIGHT HEART CATHETERIZATION WITH CORONARY ANGIOGRAM;  Surgeon: Kathleene Hazel, MD;  Location: [facility] CATH LAB;  Service: Cardiovascular;  Laterality: N/A;       HPI  from the history and physical done on the day of admission:    [name] is a 82 y.o. female with Multiple Medical problems, she was at some point under palliative care per daughter, patient has history of severe aortic  stenosis, HTN, CAD, ESRD on HD M, [facility]. Patient presents with worsening of right foot pain, unable to walk due to pain. She also notice worsening redness of right foot. The foot pain started 4 weeks ago, she thought it was gout. The pain has been progressively getting worse. She denies pain left foot.       Hospital Course:     1. Right foot ischemia with pain. Vascular surgery on board, trying to manage conservatively, clinically better, He had a stable uric acid, she was seen by vascular surgery who thought that pain was not related to arterial insufficiency or embolic phenomenon, upon detailed interview patient says that her pain has been going on for at least 3-4 months, no signs of joint effusion or arthritis, pain could be neuropathic, she feels better with oral narcotics which will be provided, we'll add Neurontin. Follow with PCP.  She is a very poor candidate for operative intervention in any case, she is frail, bedbound, underlying CAD with severe aortic stenosis, ESRD patient and 82 years old. If she declines further I would strongly recommend focusing on comfort. She is DO NOT RESUSCITATE.   2. ESRD. On Monday, Wednesday and Friday dialysis schedule. Renal consulted and was dialyzed prior schedule.  3. History of CAD with stent to RCA April 2016, also severe aortic stenosis. Currently compensated.  4. Chronic borderline hypotension. Stable and asymptomatic.  5. Chronic A. fib. [place] vasc 2 score of greater than 5. Goal will be rate control, continue amiodarone, On Coumadin with INR 2.    Discharge Condition: Stable  Follow UP  Follow-up Information    Follow up with [name], MD. Schedule an appointment as soon as possible for a visit in 2 days.   Specialty:  Family Medicine   Contact information:   [address] [place] [address] [phone]       Follow up with [facility].   Why:  HH-RN/PT arranged- they will call you to  arrange home visits   Contact information:   [address] [place] [address] [phone]        Consults obtained - VVS  Diet and Activity recommendation: See Discharge Instructions below  Discharge Instructions       Discharge Instructions    Discharge instructions    Complete by:  As directed   Follow with Primary MD [name], MD in 2-3 days   Get CBC, CMP, INR, 2 view Chest X ray checked  by Primary MD next visit.    Activity: As tolerated with Full fall precautions use walker/cane & assistance as needed   Disposition Home     Diet:  Renal - Check your  Weight same time everyday, if you gain over 2 pounds, or you develop in leg swelling, experience more shortness of breath or chest pain, call your Primary MD immediately. Follow Cardiac Low Salt Diet and 1.2 lit/day fluid restriction.   On your next visit with your primary care physician please Get Medicines reviewed and adjusted.   Please request your Prim.MD to go over all Hospital Tests and Procedure/Radiological results at the follow up, please get all Hospital records sent to your Prim MD by signing hospital release before you go home.   If you experience worsening of your admission symptoms, develop shortness of breath, life threatening emergency, suicidal or homicidal thoughts you must seek medical attention immediately by calling 911 or calling your MD immediately  if symptoms less severe.  You Must read complete instructions/literature along with all the possible adverse reactions/side effects for all the Medicines you take and that have been prescribed to you. Take any new Medicines after you have completely understood and accpet all the possible adverse reactions/side effects.   Do not drive, operating heavy machinery, perform activities at heights, swimming or participation in water activities or provide baby sitting services if your were admitted for syncope or siezures until you have seen by  Primary MD or a Neurologist and advised to do so again.  Do not drive when taking Pain medications.    Do not take more than prescribed Pain, Sleep and Anxiety Medications  Special Instructions: If you have smoked or chewed Tobacco  in the last 2 yrs please stop smoking, stop any regular Alcohol  and or any Recreational drug use.  Wear Seat belts while driving.   Please note  You were cared for by a hospitalist during your hospital stay. If you have any questions about your discharge medications or the care you received while you were in the hospital after you are discharged, you can call the unit and asked to speak with the hospitalist on call if the hospitalist that took care of you is not available. Once you are discharged, your primary care physician will handle any further medical issues. Please note that NO REFILLS for any discharge medications will be authorized once you are discharged, as it is imperative that you return to your primary care physician (or establish a relationship with a primary care physician if you do not have one) for your aftercare needs so that they can reassess your need for medications and monitor your lab values.     Increase activity slowly    Complete by:  As directed              Discharge Medications       Medication List    STOP taking these medications        acetaminophen-codeine 300-30 MG tablet  Commonly known as:  TYLENOL #3      TAKE these medications        albuterol 108 (90 Base) MCG/ACT inhaler  Commonly known as:  PROVENTIL HFA;VENTOLIN HFA  Inhale 2 puffs into the lungs every 4 (four) hours as needed for wheezing or shortness of breath.     amiodarone 200 MG tablet  Commonly known as:  PACERONE  TAKE ONE TABLET BY MOUTH DAILY.     atorvastatin 40 MG tablet  Commonly known as:  LIPITOR  Take 1 tablet (40 mg total) by mouth daily at 6 PM.     benzonatate 100 MG capsule  Commonly known as:  TESSALON  Take 1 capsule (  100  mg total) by mouth 3 (three) times daily as needed for cough.     darbepoetin 100 MCG/0.5ML Soln injection  Commonly known as:  ARANESP  Inject 100 mcg into the skin See admin instructions. Takes every Monday, Wednesday and Friday with dialysis     doxercalciferol 4 MCG/2ML injection  Commonly known as:  HECTOROL  Inject 3.5 mcg into the vein every Monday, Wednesday, and Friday with hemodialysis.     gabapentin 300 MG capsule  Commonly known as:  NEURONTIN  Take 1 capsule (300 mg total) by mouth 3 (three) times daily.     HYDROcodone-acetaminophen 5-325 MG tablet  Commonly known as:  NORCO  Take 1 tablet by mouth 2 (two) times daily as needed for moderate pain.     isosorbide mononitrate 30 MG 24 hr tablet  Commonly known as:  IMDUR  Take 1 tablet (30 mg total) by mouth daily.     nitroGLYCERIN 0.4 MG SL tablet  Commonly known as:  NITROSTAT  Place 1 tablet (0.4 mg total) under the tongue every 5 (five) minutes as needed for chest pain.     warfarin 1 MG tablet  Commonly known as:  COUMADIN  Take 1 1/2 tablets daily except 2 tablets on Mondays, Wednesdays and Fridays        Major procedures and Radiology Reports - PLEASE review detailed and final reports for all details, in brief -   TTE Jan 2017  Left ventricle: The cavity size was normal. Wall thickness was increased in a pattern of moderate LVH. Systolic function was normal. The estimated ejection fraction was in the range of 55% to 60%. - Aortic valve: There was severe stenosis. There was mild regurgitation. Valve area (VTI): 0.54 cm^2. Valve area (Vmax): 0.48 cm^2. Valve area (Vmean): 0.43 cm^2. - Mitral valve: Calcified annulus. There was moderate regurgitation. Valve area by continuity equation (using LVOT flow): 1.14 cm^2. - Left atrium: The atrium was severely dilated. - Right atrium: The atrium was mildly dilated. - Atrial septum: No defect or patent foramen ovale was identified. - Tricuspid valve: There was  moderate regurgitation. - Pulmonary arteries: PA peak pressure: 59 mm Hg (S).    ABI - The right ABI is suggestive of moderate, borderline severe arterial insufficiency at rest. The left ABI is within normal limits at rest, however this may be falsely elevated given abnormal waveforms.      Dg Chest 2 View  10/07/2015  CLINICAL DATA:  Renal failure.  Preop evaluation. EXAM: CHEST  2 VIEW COMPARISON:  07/31/2015 and 10/13/2014 FINDINGS: Patient is slightly rotated to the right. Lungs are adequately inflated without focal consolidation or effusion. There is mild prominence of the central perihilar markings likely mild vascular congestion. There is moderate stable cardiomegaly. There is calcified plaque over the thoracoabdominal aorta. There is diffuse osteopenia present. There are degenerative changes of the spine. Stable mild compression deformities over the mid to upper thoracic spine. IMPRESSION: Stable moderate cardiomegaly with evidence of mild vascular congestion. Stable mild compression deformities over the mid to upper thoracic spine. Electronically Signed   By: Elberta Fortisaniel  Boyle [name]   On: 10/07/2015 16:39   Dg Foot Complete Right  10/07/2015  CLINICAL DATA:  Pain between the first and second toes for 2 weeks. Renal failure, dialysis dependent. EXAM: RIGHT FOOT COMPLETE - 3+ VIEW COMPARISON:  12/07/2014 FINDINGS: Diffuse osteopenia evident. Chronic irregularity and bony deformity of the right fifth MTP joint and the right fifth proximal phalanx. Peripheral atherosclerosis  evident. No acute osseous finding or fracture. Minor dorsal soft tissue swelling on the lateral view. IMPRESSION: Osteopenia and chronic deformity of the right fifth MTP joint and fifth proximal phalanx. Mild dorsal soft tissue swelling. No acute osseous finding by plain radiography Peripheral atherosclerosis Electronically Signed   By: Judie Petit.  Shick [name]   On: 10/07/2015 12:32    Micro Results     Recent Results (from the past  240 hour(s))  MRSA PCR Screening     Status: None   Collection Time: 10/08/15  4:45 AM  Result Value Ref Range Status   MRSA by PCR NEGATIVE NEGATIVE Final    Comment:        The GeneXpert MRSA Assay (FDA approved for NASAL specimens only), is one component of a comprehensive MRSA colonization surveillance program. It is not intended to diagnose MRSA infection nor to guide or monitor treatment for MRSA infections.        Today   Subjective    [name] today has no headache,no chest abdominal pain,no new weakness tingling or numbness, feels much better wants to go home today.     Objective   Blood pressure 117/50, pulse 67, temperature 97.4 F (36.3 C), temperature source Oral, resp. rate 18, height 5\' 4"  (1.626 m), weight 48 kg (105 lb 13.1 oz), SpO2 96 %.   Intake/Output Summary (Last 24 hours) at 10/10/15 1048 Last data filed at 10/10/15 0846  Gross per 24 hour  Intake 1002.1 ml  Output   1500 ml  Net -497.9 ml    Exam Awake Alert, Oriented x 3, No new F.N deficits, Normal affect [place].AT,PERRAL Supple Neck,No JVD, No cervical lymphadenopathy appriciated.  Symmetrical Chest wall movement, Good air movement bilaterally, CTAB RRR,No Gallops,Rubs , Positive aortic systolic murmur, No Parasternal Heave +ve B.Sounds, Abd Soft, Non tender, No organomegaly appriciated, No rebound -guarding or rigidity. No Cyanosis, Clubbing or edema, No new Rash or bruise, R leg is equally warm as left, no signs of cellulitis or joint effusion   Data Review   CBC w Diff: Lab Results  Component Value Date   WBC 6.4 10/10/2015   HGB 11.0* 10/10/2015   HCT 34.9* 10/10/2015   PLT 117* 10/10/2015   LYMPHOPCT 27 10/07/2015   BANDSPCT 4 11/20/2014   MONOPCT 10 10/07/2015   EOSPCT 9 10/07/2015   BASOPCT 1 10/07/2015    CMP: Lab Results  Component Value Date   NA 136 10/09/2015   K 4.3 10/09/2015   CL 101 10/09/2015   CO2 18* 10/09/2015   BUN 49* 10/09/2015   CREATININE  5.34* 10/09/2015   PROT 6.3* 10/07/2015   ALBUMIN 2.5* 10/09/2015   BILITOT 0.9 10/07/2015   ALKPHOS 63 10/07/2015   AST 14* 10/07/2015   ALT 7* 10/07/2015  . Lab Results  Component Value Date   INR 2.00* 10/10/2015   INR 2.22* 10/09/2015   INR 1.68* 10/07/2015     Total Time in preparing paper work, data evaluation and todays exam - 35 minutes  Leroy Sea M.D on 10/10/2015 at 10:48 AM  [facility]   Office  [phone]

## 2015-10-10 NOTE — Care Management Important Message (Signed)
Important Message  Patient Details  Name: [name] MRN: [id number] Date of Birth: 09/01/1927   Medicare Important Message Given:  Yes    [name] [name] 10/10/2015, 11:15 AM

## 2015-10-10 NOTE — Care Management Note (Signed)
Case Management Note Donn PieriniKristi Harpreet Pompey RN, BSN Unit 2W-Case Manager [phone]  Patient Details  Name: Brittany BadgerLottie V Beard MRN: [id number] Date of Birth: 09/21/1927  Subjective/Objective:    Pt admitted with right foot pain concern for ischemic foot               Action/Plan: PTA pt lived at home with family- per conversation with pt and daughter [place] several hrs/day. Pt is non-ambulatory has needed DME in the home that includes W/C, BSC, and shower chair- orders for HH-RN/PT/aide- spoke with pt and daughter at bedside- [facility] [place]- [facility] (will not need aide as they have the PCS aide already coming daily) [facility]- pt for possible d/c later today  Expected Discharge Date:   10/10/15               Expected Discharge Plan:  Home w Home Health Services  In-House Referral:     Discharge planning Services  CM Consult  Post Acute Care Choice:  Home Health Choice offered to:  Adult Children, Patient  DME Arranged:  N/A DME Agency:  NA  HH Arranged:  RN, PT HH Agency:  [facility]  Status of Service:  Completed, signed off  Medicare Important Message Given:    Date Medicare IM Given:    Medicare IM give by:    Date Additional Medicare IM Given:    Additional Medicare Important Message give by:     If discussed at Long Length of Stay Meetings, dates discussed:    Discharge Disposition: home/home health  Additional Comments:  Darrold SpanWebster, Haidan Nhan Hall, RN 10/10/2015, 10:17 AM

## 2015-10-10 NOTE — Progress Notes (Signed)
S: Some pain in Rt foot O:BP 114/46 mmHg  Pulse 67  Temp(Src) 97.5 F (36.4 C) (Oral)  Resp 18  Ht 5\' 4"  (1.626 m)  Wt 48 kg (105 lb 13.1 oz)  BMI 18.16 kg/m2  SpO2 100%  Intake/Output Summary (Last 24 hours) at 10/10/15 0635 Last data filed at 10/09/15 1700  Gross per 24 hour  Intake 1102.1 ml  Output   1500 ml  Net -397.9 ml   Weight change:  WUJ:WJXBJGen:Awake and alert CVS: irreg, irreg 3/6 systolic M Resp: clear Abd: + BS NTND Ext: small amt edema Rt foot, erythema of distal Rt foot.  LUA AVF + bruit NEURO: CNI Ox3  No asterixis   . amiodarone  200 mg Oral Daily  . atorvastatin  40 mg Oral q1800  . docusate sodium  100 mg Oral BID  . midodrine  5 mg Oral TID WC  . multivitamin  1 tablet Oral QHS  . piperacillin-tazobactam (ZOSYN) IVPB  2.25 g Intravenous Q8H  . sodium chloride flush  3 mL Intravenous Q12H  . vancomycin  500 mg Intravenous Q M,W,F-HD  . Warfarin - Pharmacist Dosing Inpatient   Does not apply q1800   No results found. BMET    Component Value Date/Time   NA 136 10/09/2015 0745   K 4.3 10/09/2015 0745   CL 101 10/09/2015 0745   CO2 18* 10/09/2015 0745   GLUCOSE 73 10/09/2015 0745   BUN 49* 10/09/2015 0745   CREATININE 5.34* 10/09/2015 0745   CALCIUM 8.0* 10/09/2015 0745   CALCIUM 8.3* 02/26/2011 1011   GFRNONAA 7* 10/09/2015 0745   GFRAA 8* 10/09/2015 0745   CBC    Component Value Date/Time   WBC 6.4 10/10/2015 0319   RBC 3.93 10/10/2015 0319   RBC 3.44* 05/10/2013 0910   HGB 11.0* 10/10/2015 0319   HCT 34.9* 10/10/2015 0319   PLT 117* 10/10/2015 0319   MCV 88.8 10/10/2015 0319   MCH 28.0 10/10/2015 0319   MCHC 31.5 10/10/2015 0319   RDW 17.0* 10/10/2015 0319   LYMPHSABS 1.7 10/07/2015 1227   MONOABS 0.6 10/07/2015 1227   EOSABS 0.6 10/07/2015 1227   BASOSABS 0.0 10/07/2015 1227     Assessment:  1. ESRD MWF [facility] 2. Anemia 3.  A fib 4. AS 5. Low BP on midodrine   Plan: 1. HD tomorrow   [name] T

## 2015-10-10 NOTE — Progress Notes (Addendum)
Pt in stable condition, went over dc instructions wit pt and family, pt verbalised understanding, iv taken out ,cardiac monitor dc , ccmd notified, paper prescriptions given to pt, pt belongings at bedside, request and authorization form completed. Pt taken off the floor on a wheelchair by this RN

## 2015-10-11 DIAGNOSIS — Z992 Dependence on renal dialysis: Secondary | ICD-10-CM | POA: Diagnosis not present

## 2015-10-11 DIAGNOSIS — N186 End stage renal disease: Secondary | ICD-10-CM | POA: Diagnosis not present

## 2015-10-11 DIAGNOSIS — Z7689 Persons encountering health services in other specified circumstances: Secondary | ICD-10-CM | POA: Diagnosis not present

## 2015-10-12 DIAGNOSIS — I130 Hypertensive heart and chronic kidney disease with heart failure and stage 1 through stage 4 chronic kidney disease, or unspecified chronic kidney disease: Secondary | ICD-10-CM | POA: Diagnosis not present

## 2015-10-12 DIAGNOSIS — N186 End stage renal disease: Secondary | ICD-10-CM | POA: Diagnosis not present

## 2015-10-12 DIAGNOSIS — I2510 Atherosclerotic heart disease of native coronary artery without angina pectoris: Secondary | ICD-10-CM | POA: Diagnosis not present

## 2015-10-12 DIAGNOSIS — I110 Hypertensive heart disease with heart failure: Secondary | ICD-10-CM | POA: Diagnosis not present

## 2015-10-13 DIAGNOSIS — N186 End stage renal disease: Secondary | ICD-10-CM | POA: Diagnosis not present

## 2015-10-13 DIAGNOSIS — Z992 Dependence on renal dialysis: Secondary | ICD-10-CM | POA: Diagnosis not present

## 2015-10-13 DIAGNOSIS — Z7689 Persons encountering health services in other specified circumstances: Secondary | ICD-10-CM | POA: Diagnosis not present

## 2015-10-16 ENCOUNTER — Emergency Department (HOSPITAL_COMMUNITY): Payer: Medicare Other

## 2015-10-16 ENCOUNTER — Ambulatory Visit (INDEPENDENT_AMBULATORY_CARE_PROVIDER_SITE_OTHER): Admitting: *Deleted

## 2015-10-16 ENCOUNTER — Emergency Department (HOSPITAL_COMMUNITY)
Admission: EM | Admit: 2015-10-16 | Discharge: 2015-10-16 | Disposition: A | Discharge status: Home / Self Care | Payer: Medicare Other | Attending: Emergency Medicine | Admitting: Emergency Medicine

## 2015-10-16 ENCOUNTER — Telehealth: Payer: Self-pay | Admitting: *Deleted

## 2015-10-16 ENCOUNTER — Encounter (HOSPITAL_COMMUNITY): Payer: Self-pay

## 2015-10-16 DIAGNOSIS — R4182 Altered mental status, unspecified: Secondary | ICD-10-CM | POA: Insufficient documentation

## 2015-10-16 DIAGNOSIS — Z7901 Long term (current) use of anticoagulants: Secondary | ICD-10-CM | POA: Insufficient documentation

## 2015-10-16 DIAGNOSIS — I2510 Atherosclerotic heart disease of native coronary artery without angina pectoris: Secondary | ICD-10-CM | POA: Diagnosis not present

## 2015-10-16 DIAGNOSIS — J9811 Atelectasis: Secondary | ICD-10-CM | POA: Diagnosis not present

## 2015-10-16 DIAGNOSIS — Z79899 Other long term (current) drug therapy: Secondary | ICD-10-CM | POA: Diagnosis not present

## 2015-10-16 DIAGNOSIS — I252 Old myocardial infarction: Secondary | ICD-10-CM | POA: Insufficient documentation

## 2015-10-16 DIAGNOSIS — Z992 Dependence on renal dialysis: Secondary | ICD-10-CM | POA: Diagnosis not present

## 2015-10-16 DIAGNOSIS — I509 Heart failure, unspecified: Secondary | ICD-10-CM | POA: Insufficient documentation

## 2015-10-16 DIAGNOSIS — Z5181 Encounter for therapeutic drug level monitoring: Secondary | ICD-10-CM

## 2015-10-16 DIAGNOSIS — E785 Hyperlipidemia, unspecified: Secondary | ICD-10-CM | POA: Diagnosis not present

## 2015-10-16 DIAGNOSIS — I132 Hypertensive heart and chronic kidney disease with heart failure and with stage 5 chronic kidney disease, or end stage renal disease: Secondary | ICD-10-CM | POA: Insufficient documentation

## 2015-10-16 DIAGNOSIS — I4891 Unspecified atrial fibrillation: Secondary | ICD-10-CM | POA: Insufficient documentation

## 2015-10-16 DIAGNOSIS — I480 Paroxysmal atrial fibrillation: Secondary | ICD-10-CM

## 2015-10-16 DIAGNOSIS — N186 End stage renal disease: Secondary | ICD-10-CM | POA: Diagnosis not present

## 2015-10-16 DIAGNOSIS — R531 Weakness: Secondary | ICD-10-CM | POA: Diagnosis not present

## 2015-10-16 DIAGNOSIS — Z7689 Persons encountering health services in other specified circumstances: Secondary | ICD-10-CM | POA: Diagnosis not present

## 2015-10-16 LAB — TROPONIN I
TROPONIN I: 0.16 ng/mL — AB (ref <0.031)
Troponin I: 0.14 ng/mL — ABNORMAL HIGH (ref <0.031)

## 2015-10-16 LAB — APTT: aPTT: 36 seconds (ref 24–37)

## 2015-10-16 LAB — CBC
HEMATOCRIT: 36.4 % (ref 36.0–46.0)
HEMOGLOBIN: 12.2 g/dL (ref 12.0–15.0)
MCH: 29.8 pg (ref 26.0–34.0)
MCHC: 33.5 g/dL (ref 30.0–36.0)
MCV: 88.8 fL (ref 78.0–100.0)
Platelets: 168 10*3/uL (ref 150–400)
RBC: 4.1 MIL/uL (ref 3.87–5.11)
RDW: 16.6 % — ABNORMAL HIGH (ref 11.5–15.5)
WBC: 7.7 10*3/uL (ref 4.0–10.5)

## 2015-10-16 LAB — COMPREHENSIVE METABOLIC PANEL
ALBUMIN: 2.8 g/dL — AB (ref 3.5–5.0)
ALT: 12 U/L — AB (ref 14–54)
AST: 19 U/L (ref 15–41)
Alkaline Phosphatase: 63 U/L (ref 38–126)
Anion gap: 12 (ref 5–15)
BILIRUBIN TOTAL: 0.8 mg/dL (ref 0.3–1.2)
BUN: 54 mg/dL — AB (ref 6–20)
CHLORIDE: 100 mmol/L — AB (ref 101–111)
CO2: 26 mmol/L (ref 22–32)
CREATININE: 6 mg/dL — AB (ref 0.44–1.00)
Calcium: 8.2 mg/dL — ABNORMAL LOW (ref 8.9–10.3)
GFR calc Af Amer: 7 mL/min — ABNORMAL LOW (ref >60)
GFR, EST NON AFRICAN AMERICAN: 6 mL/min — AB (ref >60)
GLUCOSE: 92 mg/dL (ref 65–99)
Potassium: 4.3 mmol/L (ref 3.5–5.1)
Sodium: 138 mmol/L (ref 135–145)
Total Protein: 6.3 g/dL — ABNORMAL LOW (ref 6.5–8.1)

## 2015-10-16 LAB — DIFFERENTIAL
BASOS ABS: 0.1 10*3/uL (ref 0.0–0.1)
BASOS PCT: 1 %
Eosinophils Absolute: 0.8 10*3/uL — ABNORMAL HIGH (ref 0.0–0.7)
Eosinophils Relative: 11 %
LYMPHS ABS: 2 10*3/uL (ref 0.7–4.0)
Lymphocytes Relative: 26 %
MONOS PCT: 10 %
Monocytes Absolute: 0.7 10*3/uL (ref 0.1–1.0)
NEUTROS ABS: 4.1 10*3/uL (ref 1.7–7.7)
NEUTROS PCT: 52 %

## 2015-10-16 LAB — PROTIME-INR
INR: 2.38 — ABNORMAL HIGH (ref 0.00–1.49)
Prothrombin Time: 25.7 seconds — ABNORMAL HIGH (ref 11.6–15.2)

## 2015-10-16 LAB — ETHANOL

## 2015-10-16 NOTE — ED Provider Notes (Addendum)
CSN: [id number]     Arrival date & time 10/16/15  0622 History   First MD Initiated Contact with Patient 10/16/15 [phone]     Chief Complaint  Patient presents with  . Altered Mental Status     (Consider location/radiation/quality/duration/timing/severity/associated sxs/prior Treatment) Patient is a 83 y.o. female presenting with altered mental status. The history is provided by the patient and a relative.  Altered Mental Status  the patient is a dialysis patient normally dialyzes Monday Wednesdays and Fridays. Patient was dialyzed on Friday. Today patient was noted to have the slurred speech family stated that it was not there at 11:00 last night. She also has some generalized weakness. Patient seemed to be more confused than usual. They went to dialysis center she was referred here for evaluation. Family states that she's been weak somewhat since her discharge on April 4. But these were new changes.  They deny any nausea vomiting or diarrhea any fevers. Do state that patient's appetite has not been as good as usual.  Past Medical History  Diagnosis Date  . Anemia 03/06/2011  . Hypertension   . Coronary artery disease     PCI 2014, 2016  . Aortic stenosis, mild     severe aortic stenosis  . A-fib (HCC)   . Acute CHF (HCC)   . Hyperlipidemia   . Myocardial infarction (HCC) 2014  . Heart murmur   . Pneumonia 10/2014  . Anxiety   . ESRD (end stage renal disease) on dialysis (HCC)     "MWF; [facility]; [place]" (11/03/2014)  . Pseudoaneurysm following procedure (HCC) 05/2013    s/p cardiac cath; closed with Vasc US compression   Past Surgical History  Procedure Laterality Date  . Av fistula placement Left ~ 2012    upper arm arm  . Forearm fracture surgery Bilateral     "fell in the basement"  . Fracture surgery    . Pseudoaneurysm repair Right 05/2013    leg  . Cataract extraction w/phaco Right 10/19/2013    Procedure: CATARACT EXTRACTION PHACO AND INTRAOCULAR LENS PLACEMENT  (IOC);  Surgeon: Loraine Leriche T. Nile Riggs, MD;  Location: [facility];  Service: Ophthalmology;  Laterality: Right;  CDE:15.66  . Cataract extraction w/phaco Left 11/02/2013    Procedure: CATARACT EXTRACTION PHACO AND INTRAOCULAR LENS PLACEMENT (IOC);  Surgeon: Loraine Leriche T. Nile Riggs, MD;  Location: [facility];  Service: Ophthalmology;  Laterality: Left;  CDE:8.78  . Left and right heart catheterization with coronary angiogram N/A 05/11/2013    Procedure: LEFT AND RIGHT HEART CATHETERIZATION WITH CORONARY ANGIOGRAM;  Surgeon: Runell Gess, MD;  Location: [facility] CATH LAB;  Service: Cardiovascular;  Laterality: N/A;  . Percutaneous coronary rotoblator intervention (pci-r) N/A 05/14/2013    Procedure: PERCUTANEOUS CORONARY ROTOBLATOR INTERVENTION (PCI-R);  Surgeon: Lennette Bihari, MD;  Location: [facility] CATH LAB;  Service: Cardiovascular;  Laterality: N/A;  . Percutaneous coronary rotoblator intervention (pci-r) N/A 05/19/2013    Procedure: PERCUTANEOUS CORONARY ROTOBLATOR INTERVENTION (PCI-R);  Surgeon: Lennette Bihari, MD;  Location: [facility] CATH LAB;  Service: Cardiovascular;  Laterality: N/A;  . Cardiac catheterization  ; 11/03/2014  . Coronary angioplasty with stent placement    . Cataract extraction w/ intraocular lens  implant, bilateral Bilateral 2015  . Left and right heart catheterization with coronary angiogram N/A 11/03/2014    Procedure: LEFT AND RIGHT HEART CATHETERIZATION WITH CORONARY ANGIOGRAM;  Surgeon: Kathleene Hazel, MD;  Location: [facility] CATH LAB;  Service: Cardiovascular;  Laterality: N/A;   Family History  Problem Relation Age  of Onset  . Heart attack Sister   . Hyperlipidemia Mother   . Stroke Father   . Diabetes Brother    Social History  Substance Use Topics  . Smoking status: Never Smoker   . Smokeless tobacco: Never Used  . Alcohol Use: No   OB History    No data available     Review of Systems  Unable to perform ROS: Mental status change      Allergies  Sulfa antibiotics  Home  Medications   Prior to Admission medications   Medication Sig Start Date End Date Taking? Authorizing Provider  acetaminophen-codeine (TYLENOL #3) 300-30 MG tablet Take 1 tablet by mouth every 4 (four) hours as needed for moderate pain.   Yes Historical Provider, MD  albuterol (PROVENTIL HFA;VENTOLIN HFA) 108 (90 BASE) MCG/ACT inhaler Inhale 2 puffs into the lungs every 4 (four) hours as needed for wheezing or shortness of breath. 11/20/14  Yes Margarita Grizzle, MD  amiodarone (PACERONE) 200 MG tablet TAKE ONE TABLET BY MOUTH DAILY. 04/26/15  Yes Kathleene Hazel, MD  darbepoetin (ARANESP) 100 MCG/0.5ML SOLN injection Inject 100 mcg into the skin See admin instructions. Takes every Monday, Wednesday and Friday with dialysis   Yes Historical Provider, MD  doxercalciferol (HECTOROL) 4 MCG/2ML injection Inject 3.5 mcg into the vein every Monday, Wednesday, and Friday with hemodialysis.   Yes Historical Provider, MD  HYDROcodone-acetaminophen (NORCO) 5-325 MG tablet Take 1 tablet by mouth 2 (two) times daily as needed for moderate pain. 10/10/15  Yes Leroy Sea, MD  nitroGLYCERIN (NITROSTAT) 0.4 MG SL tablet Place 1 tablet (0.4 mg total) under the tongue every 5 (five) minutes as needed for chest pain. 11/04/14  Yes Abelino Derrick, PA-C  warfarin (COUMADIN) 1 MG tablet Take 1 1/2 tablets daily except 2 tablets on Mondays, Wednesdays and Fridays 09/28/15  Yes Chrystie Nose, MD  atorvastatin (LIPITOR) 40 MG tablet Take 1 tablet (40 mg total) by mouth daily at 6 PM. Patient not taking: Reported on 10/07/2015 08/02/15   Henderson Cloud, MD  benzonatate (TESSALON) 100 MG capsule Take 1 capsule (100 mg total) by mouth 3 (three) times daily as needed for cough. Patient not taking: Reported on 10/07/2015 08/02/15   Henderson Cloud, MD  gabapentin (NEURONTIN) 300 MG capsule Take 1 capsule (300 mg total) by mouth 3 (three) times daily. Patient not taking: Reported on 10/16/2015 10/10/15   Leroy Sea, MD  isosorbide mononitrate (IMDUR) 30 MG 24 hr tablet Take 1 tablet (30 mg total) by mouth daily. Patient not taking: Reported on 10/07/2015 08/02/15   Henderson Cloud, MD   BP 109/49 mmHg  Pulse 70  Temp(Src) 98.3 F (36.8 C) (Oral)  Resp 13  Ht  (1.626 m)  Wt 46.72 kg  BMI 17.67 kg/m2  SpO2 93% Physical Exam  Constitutional: She appears well-developed and well-nourished. No distress.  HENT:  Head: Normocephalic and atraumatic.  Mouth/Throat: Oropharynx is clear and moist.  Eyes: Conjunctivae and EOM are normal. Pupils are equal, round, and reactive to light.  Neck: Normal range of motion. Neck supple.  Cardiovascular: Normal rate, regular rhythm and normal heart sounds.   No murmur heard. Pulmonary/Chest: Effort normal and breath sounds normal. No respiratory distress.  Abdominal: Soft. Bowel sounds are normal. There is no tenderness.  Musculoskeletal: Normal range of motion.  Left arm AV fistula with good thrill  Neurological: She is alert. No cranial nerve deficit. She exhibits  normal muscle tone. Coordination normal.  Nursing note and vitals reviewed.   ED Course  Procedures (including critical care time) Labs Review Labs Reviewed  PROTIME-INR - Abnormal; Notable for the following:    Prothrombin Time 25.7 (*)    INR 2.38 (*)    All other components within normal limits  CBC - Abnormal; Notable for the following:    RDW 16.6 (*)    All other components within normal limits  DIFFERENTIAL - Abnormal; Notable for the following:    Eosinophils Absolute 0.8 (*)    All other components within normal limits  COMPREHENSIVE METABOLIC PANEL - Abnormal; Notable for the following:    Chloride 100 (*)    BUN 54 (*)    Creatinine, Ser 6.00 (*)    Calcium 8.2 (*)    Total Protein 6.3 (*)    Albumin 2.8 (*)    ALT 12 (*)    GFR calc non Af Amer 6 (*)    GFR calc Af Amer 7 (*)    All other components within normal limits  TROPONIN I - Abnormal; Notable  for the following:    Troponin I 0.16 (*)    All other components within normal limits  TROPONIN I - Abnormal; Notable for the following:    Troponin I 0.14 (*)    All other components within normal limits  ETHANOL  APTT    Imaging Review Ct Head Wo Contrast  10/16/2015  CLINICAL DATA:  Altered mental status. Diffuse weakness and slurred speech. Dialysis patient EXAM: CT HEAD WITHOUT CONTRAST TECHNIQUE: Contiguous axial images were obtained from the base of the skull through the vertex without intravenous contrast. COMPARISON:  12/07/2014 FINDINGS: Moderate atrophy. Diffuse white matter hypodensity most consistent with chronic microvascular ischemia unchanged. No acute infarct. Negative for hemorrhage or mass. Calvarium intact.  Diffuse atherosclerotic disease. IMPRESSION: Atrophy and chronic microvascular ischemia.  No acute abnormality Electronically Signed   By: Marlan Palauharles  Clark [name]   On: 10/16/2015 07:33   Mr Brain Wo Contrast  10/16/2015  CLINICAL DATA:  82 year old hypertensive female with end-stage renal disease on dialysis presenting with slurred speech and weakness for the past 2 days. No known injury. Subsequent encounter. EXAM: MRI HEAD WITHOUT CONTRAST TECHNIQUE: Multiplanar, multiecho pulse sequences of the brain and surrounding structures were obtained without intravenous contrast. COMPARISON:  10/16/2015 CT.  No comparison MR. FINDINGS: No acute infarct. Tiny blood breakdown products right periatrial region probably related to episode of remote hemorrhagic ischemia. Moderate to marked chronic small vessel disease changes. Remote small left paracentral pontine infarct. Global atrophy without hydrocephalus. No intracranial mass lesion noted on this unenhanced exam. Major intracranial vascular structures are patent. Intracranial atherosclerotic changes suspected. Partial opacification inferior mastoid air cells without obstructing lesion of the eustachian tube noted. Mild spinal stenosis  C3-4. Cervical medullary junction unremarkable. Post lens replacement otherwise orbital structures unremarkable. IMPRESSION: No acute infarct. Moderate to marked chronic small vessel disease changes. Remote small left paracentral pontine infarct. Global atrophy without hydrocephalus. No intracranial mass lesion noted on this unenhanced exam. Major intracranial vascular structures are patent. Intracranial atherosclerotic changes suspected. Partial opacification inferior mastoid air cells. Electronically Signed   By: Lacy DuverneySteven  Olson [name]   On: 10/16/2015 08:51   Dg Chest Port 1 View  10/16/2015  CLINICAL DATA:  Weakness, slurred speech. EXAM: PORTABLE CHEST 1 VIEW COMPARISON:  10/07/2015 FINDINGS: There is cardiomegaly. There is hyperinflation of the lungs compatible with COPD. Bibasilar atelectasis. Tortuous vasculature noted in the right  paratracheal region, stable. No edema or visible effusions. No acute bony abnormality. IMPRESSION: Cardiomegaly. COPD. Bibasilar atelectasis. Electronically Signed   By: Charlett Nose [name]   On: 10/16/2015 07:32   I have personally reviewed and evaluated these images and lab results as part of my medical decision-making.   EKG Interpretation   Date/Time:  Monday October 16 2015 06:36:22 EDT Ventricular Rate:  72 PR Interval:  214 QRS Duration: 126 QT Interval:  454 QTC Calculation: 497 R Axis:   144 Text Interpretation:  Sinus rhythm Borderline prolonged PR interval  Nonspecific intraventricular conduction delay Borderline repolarization  abnormality No significant change since last tracing 08 Oct 2015 Confirmed  by [facility]  MD-I, IVA ([id number]) on 10/16/2015 6:44:06 AM      MDM   Final diagnoses:  Altered mental status, unspecified altered mental status type   Patient presented for concern for acute stroke. Last seen normal around 11:00 at night and this morning she was having speech problems seem to have weakness that's been worse. Family states she's been weeks  since she was discharged from the hospital which was April 4. Patient normally dialyzed Monday Wednesdays and Friday she went to dialysis morning. They referred her here for further evaluation. Patient had improvement of her speech and improvement in her left arm weakness while here. Extensive workup including MRI brain without any evidence of acute stroke. Discussed with the hospitalist. No evidence of any infectious process developing at this time no fevers white count not elevated her troponins are chronically elevated they were checked twice second troponin and was lower than the first troponin.  Patient stable for discharge home. Patient's potassium was not elevated lungs had no significant pulmonary edema. Patient does not require dialysis today. They can call dialysis center for rearranging dialysis.  Precautions provided for patient to return for any new or worse symptoms at all.    Vanetta Mulders, MD 10/16/15 1254  Vanetta Mulders, MD 10/16/15 1254

## 2015-10-16 NOTE — Telephone Encounter (Signed)
Brittany StanleyLisa I got a phone call stating that this patient was in the hospital and the family was wondering if the hospital could check her Coumadin.  She was supposed to come in today for Coumadin.  She is in the ER now.

## 2015-10-16 NOTE — Discharge Instructions (Signed)
Extensive workup here without any answer for her confusion or speech problem. No evidence of stroke based on MRI. Patient does not have to be dialyzed today based on her chest x-ray and her potassium. But they may want to dialyze her later today or tomorrow. This will be up to her dialysis center. Return for any new or worse symptoms at all.

## 2015-10-16 NOTE — ED Notes (Signed)
Family reports pt produces no urine, MD notified. Orders d/c for UA and urine drug screen.

## 2015-10-16 NOTE — ED Notes (Signed)
Can't understand her. Her voice is different words are slurred, started yesterday.  She is weak, and has been weak since she got home from the hospital on the 4th.  She went to dialysis and all she could tell them this morning was her name.

## 2015-10-16 NOTE — Telephone Encounter (Signed)
INR 2.3.  Instructions given to daughter.  See coumadin note.

## 2015-10-17 DIAGNOSIS — Z4800 Encounter for change or removal of nonsurgical wound dressing: Secondary | ICD-10-CM | POA: Diagnosis not present

## 2015-10-17 DIAGNOSIS — L89892 Pressure ulcer of other site, stage 2: Secondary | ICD-10-CM | POA: Diagnosis not present

## 2015-10-17 DIAGNOSIS — Z7901 Long term (current) use of anticoagulants: Secondary | ICD-10-CM | POA: Diagnosis not present

## 2015-10-17 DIAGNOSIS — M6281 Muscle weakness (generalized): Secondary | ICD-10-CM | POA: Diagnosis not present

## 2015-10-17 DIAGNOSIS — N186 End stage renal disease: Secondary | ICD-10-CM | POA: Diagnosis not present

## 2015-10-17 DIAGNOSIS — Z7982 Long term (current) use of aspirin: Secondary | ICD-10-CM | POA: Diagnosis not present

## 2015-10-18 ENCOUNTER — Encounter (HOSPITAL_COMMUNITY): Payer: Self-pay

## 2015-10-18 ENCOUNTER — Emergency Department (HOSPITAL_COMMUNITY): Payer: Medicare Other

## 2015-10-18 ENCOUNTER — Emergency Department (HOSPITAL_COMMUNITY)
Admission: EM | Admit: 2015-10-18 | Discharge: 2015-10-18 | Disposition: A | Discharge status: Home / Self Care | Payer: Medicare Other | Attending: Emergency Medicine | Admitting: Emergency Medicine

## 2015-10-18 DIAGNOSIS — M79671 Pain in right foot: Secondary | ICD-10-CM | POA: Diagnosis present

## 2015-10-18 DIAGNOSIS — Y999 Unspecified external cause status: Secondary | ICD-10-CM | POA: Insufficient documentation

## 2015-10-18 DIAGNOSIS — I132 Hypertensive heart and chronic kidney disease with heart failure and with stage 5 chronic kidney disease, or end stage renal disease: Secondary | ICD-10-CM | POA: Diagnosis not present

## 2015-10-18 DIAGNOSIS — Z79899 Other long term (current) drug therapy: Secondary | ICD-10-CM | POA: Insufficient documentation

## 2015-10-18 DIAGNOSIS — Y929 Unspecified place or not applicable: Secondary | ICD-10-CM | POA: Insufficient documentation

## 2015-10-18 DIAGNOSIS — Z7901 Long term (current) use of anticoagulants: Secondary | ICD-10-CM | POA: Diagnosis not present

## 2015-10-18 DIAGNOSIS — F0390 Unspecified dementia without behavioral disturbance: Secondary | ICD-10-CM | POA: Diagnosis not present

## 2015-10-18 DIAGNOSIS — S31801A Laceration without foreign body of unspecified buttock, initial encounter: Secondary | ICD-10-CM | POA: Diagnosis not present

## 2015-10-18 DIAGNOSIS — R52 Pain, unspecified: Secondary | ICD-10-CM | POA: Diagnosis not present

## 2015-10-18 DIAGNOSIS — E785 Hyperlipidemia, unspecified: Secondary | ICD-10-CM | POA: Diagnosis not present

## 2015-10-18 DIAGNOSIS — X58XXXA Exposure to other specified factors, initial encounter: Secondary | ICD-10-CM | POA: Insufficient documentation

## 2015-10-18 DIAGNOSIS — I509 Heart failure, unspecified: Secondary | ICD-10-CM | POA: Insufficient documentation

## 2015-10-18 DIAGNOSIS — Z79891 Long term (current) use of opiate analgesic: Secondary | ICD-10-CM | POA: Insufficient documentation

## 2015-10-18 DIAGNOSIS — R031 Nonspecific low blood-pressure reading: Secondary | ICD-10-CM | POA: Diagnosis not present

## 2015-10-18 DIAGNOSIS — Z992 Dependence on renal dialysis: Secondary | ICD-10-CM | POA: Diagnosis not present

## 2015-10-18 DIAGNOSIS — L97519 Non-pressure chronic ulcer of other part of right foot with unspecified severity: Secondary | ICD-10-CM | POA: Diagnosis not present

## 2015-10-18 DIAGNOSIS — L97811 Non-pressure chronic ulcer of other part of right lower leg limited to breakdown of skin: Secondary | ICD-10-CM | POA: Diagnosis not present

## 2015-10-18 DIAGNOSIS — Y939 Activity, unspecified: Secondary | ICD-10-CM | POA: Diagnosis not present

## 2015-10-18 DIAGNOSIS — N186 End stage renal disease: Secondary | ICD-10-CM | POA: Diagnosis not present

## 2015-10-18 DIAGNOSIS — I252 Old myocardial infarction: Secondary | ICD-10-CM | POA: Insufficient documentation

## 2015-10-18 DIAGNOSIS — Z7689 Persons encountering health services in other specified circumstances: Secondary | ICD-10-CM | POA: Diagnosis not present

## 2015-10-18 DIAGNOSIS — I2510 Atherosclerotic heart disease of native coronary artery without angina pectoris: Secondary | ICD-10-CM | POA: Insufficient documentation

## 2015-10-18 LAB — CBC WITH DIFFERENTIAL/PLATELET
BASOS ABS: 0.1 10*3/uL (ref 0.0–0.1)
BASOS PCT: 1 %
EOS PCT: 8 %
Eosinophils Absolute: 0.8 10*3/uL — ABNORMAL HIGH (ref 0.0–0.7)
HCT: 38.5 % (ref 36.0–46.0)
Hemoglobin: 12 g/dL (ref 12.0–15.0)
LYMPHS PCT: 22 %
Lymphs Abs: 2.1 10*3/uL (ref 0.7–4.0)
MCH: 28.2 pg (ref 26.0–34.0)
MCHC: 31.2 g/dL (ref 30.0–36.0)
MCV: 90.6 fL (ref 78.0–100.0)
MONO ABS: 0.9 10*3/uL (ref 0.1–1.0)
Monocytes Relative: 9 %
NEUTROS ABS: 5.6 10*3/uL (ref 1.7–7.7)
Neutrophils Relative %: 60 %
Platelets: 201 10*3/uL (ref 150–400)
RBC: 4.25 MIL/uL (ref 3.87–5.11)
RDW: 16.5 % — AB (ref 11.5–15.5)
WBC: 9.4 10*3/uL (ref 4.0–10.5)

## 2015-10-18 LAB — BASIC METABOLIC PANEL
ANION GAP: 14 (ref 5–15)
BUN: 25 mg/dL — ABNORMAL HIGH (ref 6–20)
CALCIUM: 8 mg/dL — AB (ref 8.9–10.3)
CO2: 26 mmol/L (ref 22–32)
Chloride: 97 mmol/L — ABNORMAL LOW (ref 101–111)
Creatinine, Ser: 3.17 mg/dL — ABNORMAL HIGH (ref 0.44–1.00)
GFR, EST AFRICAN AMERICAN: 14 mL/min — AB (ref >60)
GFR, EST NON AFRICAN AMERICAN: 12 mL/min — AB (ref >60)
Glucose, Bld: 90 mg/dL (ref 65–99)
POTASSIUM: 3.3 mmol/L — AB (ref 3.5–5.1)
Sodium: 137 mmol/L (ref 135–145)

## 2015-10-18 LAB — SEDIMENTATION RATE: SED RATE: 24 mm/h — AB (ref 0–22)

## 2015-10-18 LAB — PROTIME-INR
INR: 2.49 — AB (ref 0.00–1.49)
PROTHROMBIN TIME: 26.6 s — AB (ref 11.6–15.2)

## 2015-10-18 LAB — C-REACTIVE PROTEIN: CRP: 15.1 mg/dL — AB (ref <1.0)

## 2015-10-18 MED ORDER — BACITRACIN ZINC 500 UNIT/GM EX OINT
TOPICAL_OINTMENT | Freq: Two times a day (BID) | CUTANEOUS | Status: DC
  Administered 2015-10-18: 8 via TOPICAL
  Filled 2015-10-18: qty 0.9

## 2015-10-18 MED ORDER — CLINDAMYCIN HCL 300 MG PO CAPS: 300.0000 mg | ORAL_CAPSULE | Freq: Four times a day (QID) | ORAL | Status: AC

## 2015-10-18 MED ORDER — CLINDAMYCIN HCL 150 MG PO CAPS
300.0000 mg | ORAL_CAPSULE | Freq: Once | ORAL | Status: AC
Start: 2015-10-18 — End: 2015-10-18
  Administered 2015-10-18: 300 mg via ORAL
  Filled 2015-10-18: qty 2

## 2015-10-18 MED ORDER — BACITRACIN ZINC 500 UNIT/GM EX OINT
TOPICAL_OINTMENT | CUTANEOUS | Status: AC
  Administered 2015-10-18: 19:00:00
  Filled 2015-10-18: qty 0.9

## 2015-10-18 NOTE — ED Notes (Signed)
Per EMS, pt here for evaluation of sacral ulcers

## 2015-10-18 NOTE — ED Notes (Signed)
Ointment applied to skin on sacral area with pink wound pad applied. Pt repositioned onto right side.

## 2015-10-18 NOTE — Discharge Instructions (Signed)
Follow-up with the wound care specialist regarding the skin tear in the sacral region and the right foot ulcer. Return immediately for fever, increased redness to either wound, persistent bleeding or for any concerns.   Delayed Wound Closure Sometimes, your health care provider will decide to delay closing a wound for several days. This is done when the wound is badly bruised, dirty, or when it has been several hours since the injury happened. By delaying the closure of your wound, the risk of infection is reduced. Wounds that are closed in 3-7 days after being cleaned up and dressed heal just as well as those that are closed right away. HOME CARE INSTRUCTIONS  Rest and elevate the injured area until the pain and swelling are gone.  Have your wound checked as instructed by your health care provider. SEEK MEDICAL CARE IF:  You develop unusual or increased swelling or redness around the wound.  You have increasing pain or tenderness.  There is increasing fluid (drainage) or a bad smelling drainage coming from the wound.   This information is not intended to replace advice given to you by your health care provider. Make sure you discuss any questions you have with your health care provider.   Document Released: 06/24/2005 Document Revised: 06/29/2013 Document Reviewed: 12/22/2012 Elsevier Interactive Patient Education 2016 [employer]  Skin Ulcer A skin ulcer is an open sore that can be shallow or deep. Skin ulcers sometimes become infected and are difficult to treat. It may be 1 month or longer before real healing progress is made. CAUSES   Injury.  Problems with the veins or arteries.  Diabetes.  Insect bites.  Bedsores.  Inflammatory conditions. SYMPTOMS   Pain, redness, swelling, and tenderness around the ulcer.  Fever.  Bleeding from the ulcer.  Yellow or clear fluid coming from the ulcer. DIAGNOSIS  There are many types of skin ulcers. Any open sores will be  examined. Certain tests will be done to determine the kind of ulcer you have. The right treatment depends on the type of ulcer you have. TREATMENT  Treatment is a long-term challenge. It may include:  Wearing an elastic wrap, compression stockings, or gel cast over the ulcer area.  Taking antibiotic medicines or putting antibiotic creams on the affected area if there is an infection. HOME CARE INSTRUCTIONS  Put on your bandages (dressings), wraps, or casts over the ulcer as directed by your caregiver.  Change all dressings as directed by your caregiver.  Take all medicines as directed by your caregiver.  Keep the affected area clean and dry.  Avoid injuries to the affected area.  Eat a well-balanced, healthy diet that includes plenty of fruit and vegetables.  If you smoke, consider quitting or decreasing the amount of cigarettes you smoke.  Once the ulcer heals, get regular exercise as directed by your caregiver.  Work with your caregiver to make sure your blood pressure, cholesterol, and diabetes are well-controlled.  Keep your skin moisturized. Dry skin can crack and lead to skin ulcers. SEEK IMMEDIATE MEDICAL CARE IF:   Your pain gets worse.  You have swelling, redness, or fluids around the ulcer.  You have chills.  You have a fever. MAKE SURE YOU:   Understand these instructions.  Will watch your condition.  Will get help right away if you are not doing well or get worse.   This information is not intended to replace advice given to you by your health care provider. Make sure you discuss any  questions you have with your health care provider.   Document Released: 08/01/2004 Document Revised: 09/16/2011 Document Reviewed: 02/08/2011 Elsevier Interactive Patient Education 2016 [employer] Inc[employer]  Skin Tear Care A skin tear is a wound in which the top layer of skin has peeled off. This is a common problem with aging because the skin becomes thinner and more fragile as a  person gets older. In addition, some medicines, such as oral corticosteroids, can lead to skin thinning if taken for long periods of time.  A skin tear is often repaired with tape or skin adhesive strips. This keeps the skin that has been peeled off in contact with the healthier skin beneath. Depending on the location of the wound, a bandage (dressing) may be applied over the tape or skin adhesive strips. Sometimes, during the healing process, the skin turns black and dies. Even when this happens, the torn skin acts as a good dressing until the skin underneath gets healthier and repairs itself. HOME CARE INSTRUCTIONS   Change dressings once per day or as directed by your caregiver.  Gently clean the skin tear and the area around the tear using saline solution or mild soap and water.  Do not rub the injured skin dry. Let the area air dry.  Apply petroleum jelly or an antibiotic cream or ointment to keep the tear moist. This will help the wound heal. Do not allow a scab to form.  If the dressing sticks before the next dressing change, moisten it with warm soapy water and gently remove it.  Protect the injured skin until it has healed.  Only take over-the-counter or prescription medicines as directed by your caregiver.  Take showers or baths using warm soapy water. Apply a new dressing after the shower or bath.  Keep all follow-up appointments as directed by your caregiver.  SEEK IMMEDIATE MEDICAL CARE IF:   You have redness, swelling, or increasing pain in the skin tear.  You havepus coming from the skin tear.  You have chills.  You have a red streak that goes away from the skin tear.  You have a bad smell coming from the tear or dressing.  You have a fever or persistent symptoms for more than 2-3 days.  You have a fever and your symptoms suddenly get worse. MAKE SURE YOU:  Understand these instructions.  Will watch this condition.  Will get help right away if your child is  not doing well or gets worse.   This information is not intended to replace advice given to you by your health care provider. Make sure you discuss any questions you have with your health care provider.   Document Released: 03/19/2001 Document Revised: 03/18/2012 Document Reviewed: 01/06/2012 Elsevier Interactive Patient Education 2016 [employer] Inc[employer]  Wound Check If you have a wound, it may take some time to heal. Eventually, a scar will form. The scar will also fade with time. It is important to take care of your wound while it is healing. This helps to protect your wound from infection.  HOW SHOULD I TAKE CARE OF MY WOUND AT HOME?  Some wounds are allowed to close on their own or are repaired at a later date. There are many different ways to close and cover a wound, including stitches (sutures), skin glue, and adhesive strips. Follow your health care provider's instructions about:  Wound care.  Bandage (dressing) changes and removal.  Wound closure removal.  Take medicines only as directed by your health care provider.  Keep  all follow-up visits as directed by your health care provider. This is important.  Do not take baths, swim, or use a hot tub until your health care provider approves. You may shower as directed by your health care provider.  Keep your wound clean and dry. WHAT AFFECTS SCAR FORMATION? Scars affect each person differently. How your body scars depends on:  The location and size of your wound.  Traits that you inherited from your parents (genetic predisposition).  How you take care of your wound. Irritation and inflammation increase the amount of scar formation.  Sun exposure. This can darken a scar. WHEN SHOULD I CALL OR SEE MY HEALTH CARE PROVIDER? Call or see your health care provider if:  You have redness, swelling, or pain at your wound site.  You have fluid, blood, or pus coming from your wound.  You have muscle aches, chills, or a general ill  feeling.  You notice a bad smell coming from the wound.  Your wound separates after the sutures, staples, or skin adhesive strips have been removed.  You have persistent nausea or vomiting.  You have a fever.  You are dizzy. WHEN SHOULD I CALL 911 OR GO TO THE EMERGENCY ROOM? Call 911 or go to the emergency room if:  You faint.  You have difficulty breathing.   This information is not intended to replace advice given to you by your health care provider. Make sure you discuss any questions you have with your health care provider.   Document Released: 03/30/2004 Document Revised: 07/15/2014 Document Reviewed: 04/05/2014 Elsevier Interactive Patient Education [employer]

## 2015-10-18 NOTE — ED Provider Notes (Signed)
CSN: [id number]     Arrival date & time 10/18/15  1246 History   First MD Initiated Contact with Patient 10/18/15 1351     Chief Complaint  Patient presents with  . Wound Check     (Consider location/radiation/quality/duration/timing/severity/associated sxs/prior Treatment) HPI Patient with ongoing right foot pain. Recently hospitalized for the same. Patient does have significant peripheral vascular disease though thought to be a candidate for surgical intervention. Family is concerned about ulceration between the great and second toe of the right foot. No fever or chills. Family is also concerned about wound to the sacral region. Sustained over the weekend with small amount of bleeding. State that the patient is bedbound and family performs all activities daily living. Return the patient in bed once or twice a day. Has wound care appointment next week on Tuesday. Patient unable to contribute to history. Level V caveat applies. Past Medical History  Diagnosis Date  . Anemia 03/06/2011  . Hypertension   . Coronary artery disease     PCI 2014, 2016  . Aortic stenosis, mild     severe aortic stenosis  . A-fib (HCC)   . Acute CHF (HCC)   . Hyperlipidemia   . Myocardial infarction (HCC) 2014  . Heart murmur   . Pneumonia 10/2014  . Anxiety   . ESRD (end stage renal disease) on dialysis (HCC)     "MWF; [facility]; [place]" (11/03/2014)  . Pseudoaneurysm following procedure (HCC) 05/2013    s/p cardiac cath; closed with Vasc US compression   Past Surgical History  Procedure Laterality Date  . Av fistula placement Left ~ 2012    upper arm arm  . Forearm fracture surgery Bilateral     "fell in the basement"  . Fracture surgery    . Pseudoaneurysm repair Right 05/2013    leg  . Cataract extraction w/phaco Right 10/19/2013    Procedure: CATARACT EXTRACTION PHACO AND INTRAOCULAR LENS PLACEMENT (IOC);  Surgeon: Loraine Leriche T. Nile Riggs, MD;  Location: [facility];  Service: Ophthalmology;  Laterality:  Right;  CDE:15.66  . Cataract extraction w/phaco Left 11/02/2013    Procedure: CATARACT EXTRACTION PHACO AND INTRAOCULAR LENS PLACEMENT (IOC);  Surgeon: Loraine Leriche T. Nile Riggs, MD;  Location: [facility];  Service: Ophthalmology;  Laterality: Left;  CDE:8.78  . Left and right heart catheterization with coronary angiogram N/A 05/11/2013    Procedure: LEFT AND RIGHT HEART CATHETERIZATION WITH CORONARY ANGIOGRAM;  Surgeon: Runell Gess, MD;  Location: [facility] CATH LAB;  Service: Cardiovascular;  Laterality: N/A;  . Percutaneous coronary rotoblator intervention (pci-r) N/A 05/14/2013    Procedure: PERCUTANEOUS CORONARY ROTOBLATOR INTERVENTION (PCI-R);  Surgeon: Lennette Bihari, MD;  Location: [facility] CATH LAB;  Service: Cardiovascular;  Laterality: N/A;  . Percutaneous coronary rotoblator intervention (pci-r) N/A 05/19/2013    Procedure: PERCUTANEOUS CORONARY ROTOBLATOR INTERVENTION (PCI-R);  Surgeon: Lennette Bihari, MD;  Location: [facility] CATH LAB;  Service: Cardiovascular;  Laterality: N/A;  . Cardiac catheterization  ; 11/03/2014  . Coronary angioplasty with stent placement    . Cataract extraction w/ intraocular lens  implant, bilateral Bilateral 2015  . Left and right heart catheterization with coronary angiogram N/A 11/03/2014    Procedure: LEFT AND RIGHT HEART CATHETERIZATION WITH CORONARY ANGIOGRAM;  Surgeon: Kathleene Hazel, MD;  Location: [facility] CATH LAB;  Service: Cardiovascular;  Laterality: N/A;   Family History  Problem Relation Age of Onset  . Heart attack Sister   . Hyperlipidemia Mother   . Stroke Father   . Diabetes Brother  Social History  Substance Use Topics  . Smoking status: Never Smoker   . Smokeless tobacco: Never Used  . Alcohol Use: No   OB History    No data available     Review of Systems  Unable to perform ROS: Dementia      Allergies  Sulfa antibiotics  Home Medications   Prior to Admission medications   Medication Sig Start Date End Date Taking? Authorizing Provider   acetaminophen-codeine (TYLENOL #3) 300-30 MG tablet Take 1 tablet by mouth every 4 (four) hours as needed for moderate pain.   Yes Historical Provider, MD  albuterol (PROVENTIL HFA;VENTOLIN HFA) 108 (90 BASE) MCG/ACT inhaler Inhale 2 puffs into the lungs every 4 (four) hours as needed for wheezing or shortness of breath. 11/20/14  Yes Margarita Grizzleanielle Ray, MD  amiodarone (PACERONE) 200 MG tablet TAKE ONE TABLET BY MOUTH DAILY. 04/26/15  Yes Kathleene Hazelhristopher D McAlhany, MD  darbepoetin (ARANESP) 100 MCG/0.5ML SOLN injection Inject 100 mcg into the skin See admin instructions. Takes every Monday, Wednesday and Friday with dialysis   Yes Historical Provider, MD  doxercalciferol (HECTOROL) 4 MCG/2ML injection Inject 3.5 mcg into the vein every Monday, Wednesday, and Friday with hemodialysis.   Yes Historical Provider, MD  HYDROcodone-acetaminophen (NORCO) 5-325 MG tablet Take 1 tablet by mouth 2 (two) times daily as needed for moderate pain. 10/10/15  Yes Leroy SeaPrashant K Singh, MD  nitroGLYCERIN (NITROSTAT) 0.4 MG SL tablet Place 1 tablet (0.4 mg total) under the tongue every 5 (five) minutes as needed for chest pain. 11/04/14  Yes Abelino DerrickLuke K Kilroy, PA-C  warfarin (COUMADIN) 1 MG tablet Take 1 1/2 tablets daily except 2 tablets on Mondays, Wednesdays and Fridays 09/28/15  Yes Chrystie NoseKenneth C Hilty, MD  benzonatate (TESSALON) 100 MG capsule Take 1 capsule (100 mg total) by mouth 3 (three) times daily as needed for cough. Patient not taking: Reported on 10/07/2015 08/02/15   Henderson CloudEstela Y Hernandez Acosta, MD  clindamycin (CLEOCIN) 300 MG capsule Take 1 capsule (300 mg total) by mouth 4 (four) times daily. X 7 days 10/18/15   Loren Raceravid Faviola Klare, MD  gabapentin (NEURONTIN) 300 MG capsule Take 1 capsule (300 mg total) by mouth 3 (three) times daily. Patient not taking: Reported on 10/16/2015 10/10/15   Leroy SeaPrashant K Singh, MD  isosorbide mononitrate (IMDUR) 30 MG 24 hr tablet Take 1 tablet (30 mg total) by mouth daily. Patient not taking: Reported on  10/07/2015 08/02/15   Henderson CloudEstela Y Hernandez Acosta, MD   BP 102/51 mmHg  Pulse 77  Temp(Src) 98.2 F (36.8 C) (Oral)  Resp 16  Ht 5\' 5"  (1.651 m)  Wt 103 lb (46.72 kg)  BMI 17.14 kg/m2  SpO2 97% Physical Exam  Constitutional: She appears well-developed and well-nourished. No distress.  Chronically ill-appearing  HENT:  Head: Normocephalic and atraumatic.  Mouth/Throat: Oropharynx is clear and moist. No oropharyngeal exudate.  Eyes: EOM are normal. Pupils are equal, round, and reactive to light.  Neck: Normal range of motion. Neck supple.  Cardiovascular: Normal rate and regular rhythm.  Exam reveals no gallop and no friction rub.   No murmur heard. Pulmonary/Chest: Effort normal and breath sounds normal. No respiratory distress. She has no wheezes. She has no rales. She exhibits no tenderness.  Abdominal: Soft. Bowel sounds are normal. She exhibits no distension and no mass. There is no tenderness. There is no rebound and no guarding.  Musculoskeletal: Normal range of motion. She exhibits tenderness. She exhibits no edema.  Patient has left upper extremity  dialysis fistula with palpable thrill. There is a roughly 6-8 cm in diameter skin tear to the sacral region. No active bleeding. No evidence of infection including erythema, warmth or purulent drainage. Patient has a small 1 cm in diameter ulceration to the medial surface of the second toe of the right foot. Significant tenderness to palpation at this area. No definite warmth or swelling noted. Diminished pulses in this extremity as well.  Neurological: She is alert.  Moving all extremities. Sensation appears to be intact. Per family patient is at her mental baseline.  Skin: Skin is warm and dry. No rash noted. No erythema.  Psychiatric: She has a normal mood and affect. Her behavior is normal.  Nursing note and vitals reviewed.   ED Course  Procedures (including critical care time) Labs Review Labs Reviewed  CBC WITH  DIFFERENTIAL/PLATELET - Abnormal; Notable for the following:    RDW 16.5 (*)    Eosinophils Absolute 0.8 (*)    All other components within normal limits  BASIC METABOLIC PANEL - Abnormal; Notable for the following:    Potassium 3.3 (*)    Chloride 97 (*)    BUN 25 (*)    Creatinine, Ser 3.17 (*)    Calcium 8.0 (*)    GFR calc non Af Amer 12 (*)    GFR calc Af Amer 14 (*)    All other components within normal limits  SEDIMENTATION RATE - Abnormal; Notable for the following:    Sed Rate 24 (*)    All other components within normal limits  C-REACTIVE PROTEIN - Abnormal; Notable for the following:    CRP 15.1 (*)    All other components within normal limits  PROTIME-INR - Abnormal; Notable for the following:    Prothrombin Time 26.6 (*)    INR 2.49 (*)    All other components within normal limits    Imaging Review No results found. I have personally reviewed and evaluated these images and lab results as part of my medical decision-making.   EKG Interpretation None      MDM   Final diagnoses:  Foot ulcer, right, with unspecified severity (HCC)  Tear of skin of buttock, unspecified laterality, initial encounter     Issue with chronic right foot ulcer. No evidence of osteo-myelitis on x-ray. Has normal white cell count but elevation in sedimentation rate. We will start on antibiotics and have follow-up with wound care as previously arranged. Patient also appears to have a skin tear to the sacral area. Does not appear infected. Dressing placed and again will follow up with wound care regarding this.   Loren Racer, MD 10/22/15 [phone]

## 2015-10-19 DIAGNOSIS — Z7982 Long term (current) use of aspirin: Secondary | ICD-10-CM | POA: Diagnosis not present

## 2015-10-19 DIAGNOSIS — M6281 Muscle weakness (generalized): Secondary | ICD-10-CM | POA: Diagnosis not present

## 2015-10-19 DIAGNOSIS — Z4800 Encounter for change or removal of nonsurgical wound dressing: Secondary | ICD-10-CM | POA: Diagnosis not present

## 2015-10-19 DIAGNOSIS — Z7901 Long term (current) use of anticoagulants: Secondary | ICD-10-CM | POA: Diagnosis not present

## 2015-10-19 DIAGNOSIS — L89892 Pressure ulcer of other site, stage 2: Secondary | ICD-10-CM | POA: Diagnosis not present

## 2015-10-20 DIAGNOSIS — Z992 Dependence on renal dialysis: Secondary | ICD-10-CM | POA: Diagnosis not present

## 2015-10-20 DIAGNOSIS — Z7689 Persons encountering health services in other specified circumstances: Secondary | ICD-10-CM | POA: Diagnosis not present

## 2015-10-20 DIAGNOSIS — N186 End stage renal disease: Secondary | ICD-10-CM | POA: Diagnosis not present

## 2015-10-22 DIAGNOSIS — Z7901 Long term (current) use of anticoagulants: Secondary | ICD-10-CM | POA: Diagnosis not present

## 2015-10-22 DIAGNOSIS — M6281 Muscle weakness (generalized): Secondary | ICD-10-CM | POA: Diagnosis not present

## 2015-10-22 DIAGNOSIS — L89892 Pressure ulcer of other site, stage 2: Secondary | ICD-10-CM | POA: Diagnosis not present

## 2015-10-22 DIAGNOSIS — Z4800 Encounter for change or removal of nonsurgical wound dressing: Secondary | ICD-10-CM | POA: Diagnosis not present

## 2015-10-22 DIAGNOSIS — Z7982 Long term (current) use of aspirin: Secondary | ICD-10-CM | POA: Diagnosis not present

## 2015-10-23 DIAGNOSIS — Z7982 Long term (current) use of aspirin: Secondary | ICD-10-CM | POA: Diagnosis not present

## 2015-10-23 DIAGNOSIS — M6281 Muscle weakness (generalized): Secondary | ICD-10-CM | POA: Diagnosis not present

## 2015-10-23 DIAGNOSIS — Z7901 Long term (current) use of anticoagulants: Secondary | ICD-10-CM | POA: Diagnosis not present

## 2015-10-23 DIAGNOSIS — L89892 Pressure ulcer of other site, stage 2: Secondary | ICD-10-CM | POA: Diagnosis not present

## 2015-10-23 DIAGNOSIS — Z4800 Encounter for change or removal of nonsurgical wound dressing: Secondary | ICD-10-CM | POA: Diagnosis not present

## 2015-10-23 DIAGNOSIS — Z992 Dependence on renal dialysis: Secondary | ICD-10-CM | POA: Diagnosis not present

## 2015-10-23 DIAGNOSIS — Z7689 Persons encountering health services in other specified circumstances: Secondary | ICD-10-CM | POA: Diagnosis not present

## 2015-10-23 DIAGNOSIS — N186 End stage renal disease: Secondary | ICD-10-CM | POA: Diagnosis not present

## 2015-10-24 ENCOUNTER — Encounter: Payer: Medicare Other | Attending: Internal Medicine | Admitting: Internal Medicine

## 2015-10-24 DIAGNOSIS — D649 Anemia, unspecified: Secondary | ICD-10-CM | POA: Insufficient documentation

## 2015-10-24 DIAGNOSIS — I739 Peripheral vascular disease, unspecified: Secondary | ICD-10-CM | POA: Diagnosis not present

## 2015-10-24 DIAGNOSIS — L89152 Pressure ulcer of sacral region, stage 2: Secondary | ICD-10-CM | POA: Diagnosis not present

## 2015-10-24 DIAGNOSIS — I70234 Atherosclerosis of native arteries of right leg with ulceration of heel and midfoot: Secondary | ICD-10-CM | POA: Insufficient documentation

## 2015-10-24 DIAGNOSIS — N186 End stage renal disease: Secondary | ICD-10-CM | POA: Diagnosis not present

## 2015-10-24 DIAGNOSIS — Z7901 Long term (current) use of anticoagulants: Secondary | ICD-10-CM | POA: Diagnosis not present

## 2015-10-24 DIAGNOSIS — M6281 Muscle weakness (generalized): Secondary | ICD-10-CM | POA: Diagnosis not present

## 2015-10-24 DIAGNOSIS — I70245 Atherosclerosis of native arteries of left leg with ulceration of other part of foot: Secondary | ICD-10-CM | POA: Diagnosis not present

## 2015-10-24 DIAGNOSIS — L97511 Non-pressure chronic ulcer of other part of right foot limited to breakdown of skin: Secondary | ICD-10-CM | POA: Insufficient documentation

## 2015-10-24 DIAGNOSIS — L89892 Pressure ulcer of other site, stage 2: Secondary | ICD-10-CM | POA: Diagnosis not present

## 2015-10-24 DIAGNOSIS — I70235 Atherosclerosis of native arteries of right leg with ulceration of other part of foot: Secondary | ICD-10-CM | POA: Diagnosis not present

## 2015-10-24 DIAGNOSIS — G4730 Sleep apnea, unspecified: Secondary | ICD-10-CM | POA: Insufficient documentation

## 2015-10-24 DIAGNOSIS — Z7982 Long term (current) use of aspirin: Secondary | ICD-10-CM | POA: Diagnosis not present

## 2015-10-24 DIAGNOSIS — L89150 Pressure ulcer of sacral region, unstageable: Secondary | ICD-10-CM | POA: Diagnosis not present

## 2015-10-24 DIAGNOSIS — L97521 Non-pressure chronic ulcer of other part of left foot limited to breakdown of skin: Secondary | ICD-10-CM | POA: Insufficient documentation

## 2015-10-24 DIAGNOSIS — I2510 Atherosclerotic heart disease of native coronary artery without angina pectoris: Secondary | ICD-10-CM | POA: Insufficient documentation

## 2015-10-24 DIAGNOSIS — Z4800 Encounter for change or removal of nonsurgical wound dressing: Secondary | ICD-10-CM | POA: Diagnosis not present

## 2015-10-24 DIAGNOSIS — J449 Chronic obstructive pulmonary disease, unspecified: Secondary | ICD-10-CM | POA: Insufficient documentation

## 2015-10-24 DIAGNOSIS — Z992 Dependence on renal dialysis: Secondary | ICD-10-CM | POA: Diagnosis not present

## 2015-10-25 NOTE — Progress Notes (Signed)
[name], Brittany V. ([id number]) Visit Report for 10/24/2015 Abuse/Suicide Risk Screen Details Patient Name: [name], Brittany V. Date of Service: 10/24/2015 8:45 AM Medical Record Patient Account Number: [id number] Number: Treating RN: [name] 12/05/1927 [phone](82 y.o. Other Clinician: Date of Birth/Sex: Female) Treating ROBSON, Beard Primary Care Physician/Extender: [name] Physician: Referring Physician: HILL, Beard Weeks in Treatment: 0 Abuse/Suicide Risk Screen Items Answer ABUSE/SUICIDE RISK SCREEN: Has anyone close to you tried to hurt or harm you recentlyo No Do you feel uncomfortable with anyone in your familyo No Has anyone forced you do things that you didnot want to doo No Do you have any thoughts of harming yourselfo No Patient displays signs or symptoms of abuse and/or neglect. No Electronic Signature(s) Signed: 10/25/2015 3:02:50 PM By: Brittany EricAfful, Beard BSN, RN Entered By: Brittany EricAfful, Beard on 10/24/2015 08:48:19 [name], Brittany V. ([id number]) -------------------------------------------------------------------------------- Activities of Daily Living Details Patient Name: [name], Brittany V. Date of Service: 10/24/2015 8:45 AM Medical Record Patient Account Number: [id number] Number: Treating RN: [name] 03/20/1928 [phone](82 y.o. Other Clinician: Date of Birth/Sex: Female) Treating ROBSON, Beard Primary Care Physician/Extender: [name] Physician: Referring Physician: HILL, Beard Weeks in Treatment: 0 Activities of Daily Living Items Answer Activities of Daily Living (Please select one for each item) Drive Automobile Not Able Take Medications Need Assistance Use Telephone Need Assistance Care for Appearance Need Assistance Use Toilet Need Assistance Bath / Shower Need Assistance Dress Self Need Assistance Feed Self Need Assistance Walk Need Assistance Get In / Out Bed Need Assistance Housework Need Assistance Prepare  Meals Need Assistance Handle Money Need Assistance Shop for Self Need Assistance Electronic Signature(s) Signed: 10/25/2015 3:02:50 PM By: Brittany EricAfful, Beard BSN, RN Entered By: Brittany EricAfful, Beard on 10/24/2015 08:48:49 [name], Brittany V. ([id number]) -------------------------------------------------------------------------------- Education Assessment Details Patient Name: [name], Brittany V. Date of Service: 10/24/2015 8:45 AM Medical Record Patient Account Number: [id number] Number: Treating RN: [name] 01/17/1928 [phone](82 y.o. Other Clinician: Date of Birth/Sex: Female) Treating ROBSON, Beard Primary Care Physician/Extender: [name] Physician: Referring Physician: Mirna MiresHILL, Beard Weeks in Treatment: 0 Primary Learner Assessed: Patient Learning Preferences/Education Level/Primary Language Learning Preference: Explanation Highest Education Level: Grade School Preferred Language: English Cognitive Barrier Assessment/Beliefs Language Barrier: No Physical Barrier Assessment Impaired Vision: Yes Glasses Impaired Hearing: No Decreased Hand dexterity: No Knowledge/Comprehension Assessment Knowledge Level: Low Comprehension Level: Low Ability to understand written Low instructions: Ability to understand verbal Low instructions: Motivation Assessment Anxiety Level: Calm Cooperation: Cooperative Education Importance: Acknowledges Need Interest in Health Problems: Uninterested Perception: Confused Willingness to Engage in Self- Low Management Activities: Readiness to Engage in Self- Low Management Activities: Electronic Signature(s) Signed: 10/25/2015 3:02:50 PM By: Brittany EricAfful, Beard BSN, RN [address]HOMAS, Brittany FindersLOTTIE V. ([id number]) Entered By: Brittany EricAfful, Beard on 10/24/2015 08:49:15 [name], Brittany V. ([id number]) -------------------------------------------------------------------------------- Fall Risk Assessment Details Patient Name: [name], Brittany V. Date of Service: 10/24/2015 8:45  AM Medical Record Patient Account Number: [id number] Number: Treating RN: [name] 04/13/1928 [phone](82 y.o. Other Clinician: Date of Birth/Sex: Female) Treating ROBSON, Beard Primary Care Physician/Extender: [name] Physician: Referring Physician: HILL, Beard Weeks in Treatment: 0 Fall Risk Assessment Items Have you had 2 or more falls in the last 12 monthso 0 No Have you had any fall that resulted in injury in the last 12 monthso 0 No FALL RISK ASSESSMENT: History of falling - immediate or within 3 months 0 No Secondary diagnosis 0 No Ambulatory aid None/bed rest/wheelchair/nurse 0 Yes Crutches/cane/walker 0 No  Furniture 0 No IV Access/Saline Lock 0 No Gait/Training Normal/bed rest/immobile 0 No Weak 10 Yes Impaired 20 Yes Mental Status Oriented to own ability 0 Yes Electronic Signature(s) Signed: 10/25/2015 3:02:50 PM By: [name] BSN, RN Entered By: [name] on 10/24/2015 08:49:42 [name] ([id number]) -------------------------------------------------------------------------------- Foot Assessment Details Patient Name: [name] Date of Service: 10/24/2015 8:45 AM Medical Record Patient Account Number: [id number] Number: Treating RN: Brittany Mealy, RN, BSN, Beard [birth year] [phone] y.o. Other Clinician: Date of Birth/Sex: Female) Treating ROBSON, Beard Primary Care Physician/Extender: Brittany Low, Beard Physician: Referring Physician: HILL, Beard Weeks in Treatment: 0 Foot Assessment Items Site Locations + = Sensation present, - = Sensation absent, C = Callus, U = Ulcer R = Redness, W = Warmth, M = Maceration, PU = Pre-ulcerative lesion F = Fissure, S = Swelling, D = Dryness Assessment Right: Left: Other Deformity: No No Prior Foot Ulcer: No No Prior Amputation: No No Charcot Joint: No No Ambulatory Status: Non-ambulatory Assistance Device: Wheelchair Gait: Surveyor, mining) Signed: 10/25/2015  3:02:50 PM By: [name] BSN, RN [address] Brittany Finders ([id number]) Entered By: [name] on 10/24/2015 08:49:59 [name] ([id number]) -------------------------------------------------------------------------------- Nutrition Risk Assessment Details Patient Name: [name] Date of Service: 10/24/2015 8:45 AM Medical Record Patient Account Number: [id number] Number: Treating RN: Brittany Mealy, RN, BSN, Beard [birth year] [phone] y.o. Other Clinician: Date of Birth/Sex: Female) Treating ROBSON, Beard Primary Care Physician/Extender: Brittany Low, Beard Physician: Referring Physician: HILL, Beard Weeks in Treatment: 0 Height (in): Weight (lbs): Body Mass Index (BMI): Nutrition Risk Assessment Items NUTRITION RISK SCREEN: I have an illness or condition that made me change the kind and/or 0 No amount of food I eat I eat fewer than two meals per day 0 No I eat few fruits and vegetables, or milk products 0 No I have three or more drinks of beer, liquor or wine almost every day 0 No I have tooth or mouth problems that make it hard for me to eat 0 No I don't always have enough money to buy the food I need 0 No I eat alone most of the time 0 No I take three or more different prescribed or over-the-counter drugs a 0 No day Without wanting to, I have lost or gained 10 pounds in the last six 0 No months I am not always physically able to shop, cook and/or feed myself 0 No Nutrition Protocols Good Risk Protocol 0 No interventions needed Moderate Risk Protocol Electronic Signature(s) Signed: 10/25/2015 3:02:50 PM By: [name] BSN, RN Entered By: [name] on 10/24/2015 08:49:48

## 2015-10-26 NOTE — Progress Notes (Signed)
[name] ([id number]) Visit Report for 10/24/2015 Chief Complaint Document Details Patient Name: [name], [name]. Date of Service: 10/24/2015 8:45 AM Medical Record Patient Account Number: [id number] Number: Treating RN: Clover Mealy, RN, BSN, Rita 1928/05/05 [phone] y.o. Other Clinician: Date of Birth/Sex: Female) Treating ROBSON, MICHAEL Primary Care Physician/Extender: Alyse Low, GERALD Physician: Referring Physician: Mirna Mires Weeks in Treatment: 0 Information Obtained from: Patient Chief Complaint 10/24/15 the patient is here for ischemic wounds in the web spaces of her right first second, second third, third fourth toes on the right and second third toes on the left. She also has a pressure area on her buttock Electronic Signature(s) Signed: 10/25/2015 5:01:27 PM By: Baltazar Najjar MD Entered By: Baltazar Najjar on 10/24/2015 12:09:26 [name] ([id number]) -------------------------------------------------------------------------------- HPI Details Patient Name: [name] Date of Service: 10/24/2015 8:45 AM Medical Record Patient Account Number: [id number] Number: Treating RN: Clover Mealy, RN, BSN, Rita Oct 01, 1927 [phone] y.o. Other Clinician: Date of Birth/Sex: Female) Treating ROBSON, MICHAEL Primary Care Physician/Extender: Alyse Low, GERALD Physician: Referring Physician: Mirna Mires Weeks in Treatment: 0 History of Present Illness HPI Description: 10/24/15; this is a frail 82 year old woman who is here for review of wounds on her foot. She was hospitalized from 4/1 through 10/10/15 with right foot pain felt to be ischemic. It was also felt that she could have a component of neuropathic pain. She was seen by Dr. Arbie Cookey of vascular surgery noted that her ABI on the right was 0.54 and on the left 1.06 however she had bilateral dampened monophasic wave forms bilaterally. Her right foot was noted to be tender to touch although it was warm. Since she is  returned home she has apparently developed some open areas between the toes. She has home health going out there using silver alginate she has also developed a large decubitus ulcer over her coccyx and proximal gluteal area. There are applying silver alginate to this as well. She is not a diabetic but has chronic renal failure on dialysis. She apparently has severe aortic stenosis with an echocardiogram in January 2017 showing an EF of 55-60% valve area of 0.48 cmo the patient is in constant pain in the right foot. She is apparently not eating and drinking well and has lost weight to. They are having to crush her medications. She does not get up to ambulate for the last 6 months I am not sure of all the issues here. Electronic Signature(s) Signed: 10/25/2015 5:01:27 PM By: Baltazar Najjar MD Entered By: Baltazar Najjar on 10/24/2015 12:32:20 [name] ([id number]) -------------------------------------------------------------------------------- Physical Exam Details Patient Name: [name] Date of Service: 10/24/2015 8:45 AM Medical Record Patient Account Number: [id number] Number: Treating RN: Clover Mealy, RN, BSN, Rita 1928/05/03 [phone] y.o. Other Clinician: Date of Birth/Sex: Female) Treating ROBSON, MICHAEL Primary Care Physician/Extender: Alyse Low, GERALD Physician: Referring Physician: HILL, GERALD Weeks in Treatment: 0 Constitutional Sitting or standing Blood Pressure is within target range for patient.. Pulse regular and within target range for patient.Marland Kitchen Respirations regular, non-labored and within target range.. Temperature is normal and within the target range for the patient.. Patient's appearance is neat and clean. Appears in no acute distress. Well nourished and well developed.. Ears, Nose, Mouth, and Throat Oropharynx within normal limits, without erythema, exudate or ulceration. Mucous membranes are moist. Respiratory Respiratory effort is easy and  symmetric bilaterally. Rate is normal at rest and on room air.. Bilateral breath sounds are clear and equal in all lobes with no wheezes, rales  or rhonchi.. Cardiovascular 3/6 pansystolic murmur at the lower left sternal border 3/6 systolic ejection murmur over the aortic area. Jugular venous pressure is not elevated. Femoral arteries without bruits and pulses strong. I cannot feel any pulses below this including no popliteal pulses. Pedal pulses absent bilaterally.. Gastrointestinal (GI) Loose abdominal tissue suggesting weight loss. No liver or spleen enlargement or tenderness.. Lymphatic Nonpalpable in the popliteal inguinal areas.. Musculoskeletal There was no evidence of acute arthritis in any of her metatarsal phalangeal joints. Integumentary (Hair, Skin) The patient has a fairly substantial pressure area over her coccyx and surrounding gluteal. There is no subcutaneous crepitus no infection. The surface of the wound is covered with fibrinous slough. Psychiatric Patient appears depressed today.. Notes Wound exam; there are multiple wounds on the right foot in the webspaces of her first and second, second and third and third and fourth toes. On the left there is small ulcers medially between the left second and third toe web space. I think she has progressive ischemia of the medial aspect of her right foot. All of this is painful. She has a substantial area of tissue loss over her coccyx and surrounding gluteals. I think this is limited due to stage II. Hasn't fibrinous surface slough needs debridement although I did not do this today Electronic Signature(s) Signed: 10/25/2015 5:01:27 PM By: Baltazar Najjarobson, Michael MD [name], Tayja Beard. ([id number]) Entered By: Baltazar Najjarobson, Michael on 10/24/2015 12:41:56 [name], Reola Beard. ([id number]) -------------------------------------------------------------------------------- Physician Orders Details Patient Name: [name], [name]. Date of Service: 10/24/2015  8:45 AM Medical Record Patient Account Number: [id number] Number: Treating RN: Clover MealyAfful, RN, BSN, Rita 03/30/1928 [phone](82 y.o. Other Clinician: Date of Birth/Sex: Female) Treating ROBSON, MICHAEL Primary Care Physician/Extender: Alyse LowG HILL, GERALD Physician: Referring Physician: Mirna MiresHILL, GERALD Weeks in Treatment: 0 Verbal / Phone Orders: Yes Clinician: Afful, RN, BSN, Rita Read Back and Verified: Yes Diagnosis Coding Wound Cleansing Wound #1 Right,Medial Toe Second o Clean wound with Normal Saline. Wound #2 Right,Medial Toe Third o Clean wound with Normal Saline. Wound #3 Right Toe Fourth o Clean wound with Normal Saline. Wound #4 Left Toe Great o Clean wound with Normal Saline. Wound #5 Left,Medial Toe Second o Clean wound with Normal Saline. Wound #6 Sacrum o Clean wound with Normal Saline. Anesthetic Wound #1 Right,Medial Toe Second o Topical Lidocaine 4% cream applied to wound bed prior to debridement Wound #2 Right,Medial Toe Third o Topical Lidocaine 4% cream applied to wound bed prior to debridement Wound #3 Right Toe Fourth o Topical Lidocaine 4% cream applied to wound bed prior to debridement Wound #4 Left Toe Great o Topical Lidocaine 4% cream applied to wound bed prior to debridement Wound #5 Left,Medial Toe Second o Topical Lidocaine 4% cream applied to wound bed prior to debridement [name], Merci Beard. ([id number]) Wound #6 Sacrum o Topical Lidocaine 4% cream applied to wound bed prior to debridement Primary Wound Dressing Wound #1 Right,Medial Toe Second o Aquacel Ag Wound #2 Right,Medial Toe Third o Aquacel Ag Wound #3 Right Toe Fourth o Aquacel Ag Wound #4 Left Toe Great o Aquacel Ag Wound #5 Left,Medial Toe Second o Aquacel Ag Wound #6 Sacrum o Aquacel Ag Secondary Dressing Wound #1 Right,Medial Toe Second o Gauze and Kerlix/Conform Wound #2 Right,Medial Toe Third o Gauze and Kerlix/Conform Wound #3 Right  Toe Fourth o Gauze and Kerlix/Conform Wound #4 Left Toe Great o Gauze and Kerlix/Conform Wound #5 Left,Medial Toe Second o Gauze and Kerlix/Conform Wound #6 Sacrum o Boardered Foam Dressing Dressing Change Frequency  Wound #1 Right,Medial Toe Second o Change dressing every other day. Wound #2 Right,Medial Toe Third o Change dressing every other day. LETTY, SALVI ([id number]) Wound #3 Right Toe Fourth o Change dressing every other day. Wound #4 Left Toe Great o Change dressing every other day. Wound #5 Left,Medial Toe Second o Change dressing every other day. Wound #6 Sacrum o Change dressing every other day. Follow-up Appointments Wound #1 Right,Medial Toe Second o Return Appointment in 2 weeks. Wound #2 Right,Medial Toe Third o Return Appointment in 2 weeks. Wound #3 Right Toe Fourth o Return Appointment in 2 weeks. Wound #4 Left Toe Great o Return Appointment in 2 weeks. Wound #5 Left,Medial Toe Second o Return Appointment in 2 weeks. Wound #6 Sacrum o Return Appointment in 2 weeks. Off-Loading Wound #1 Right,Medial Toe Second o Other: - [facility] Wound #2 Right,Medial Toe Third o Other: - [facility] Wound #3 Right Toe Fourth o Other: - [facility] Wound #4 Left Toe Great o Other: - [facility] Wound #5 Left,Medial Toe Second o Other: - [facility] Wound #6 Sacrum Reach, Chauncey Beard. ([id number]) o Turn and reposition every 2 hours Additional Orders / Instructions Wound #1 Right,Medial Toe Second o Increase protein intake. Wound #2 Right,Medial Toe Third o Increase protein intake. Wound #3 Right Toe Fourth o Increase protein intake. Wound #4 Left Toe Great o Increase protein intake. Wound #5 Left,Medial Toe Second o Increase protein intake. Wound #6 Sacrum o Increase protein intake. Home Health Wound #1 Right,Medial Toe Second o [facility] o Home Health  Nurse may visit PRN to address patientos wound care needs. o FACE TO FACE ENCOUNTER: MEDICARE and MEDICAID PATIENTS: I certify that this patient is under my care and that I had a face-to-face encounter that meets the physician face-to-face encounter requirements with this patient on this date. The encounter with the patient was in whole or in part for the following MEDICAL CONDITION: (primary reason for Home Healthcare) MEDICAL NECESSITY: I certify, that based on my findings, NURSING services are a medically necessary home health service. HOME BOUND STATUS: I certify that my clinical findings support that this patient is homebound (i.e., Due to illness or injury, pt requires aid of supportive devices such as crutches, cane, wheelchairs, walkers, the use of special transportation or the assistance of another person to leave their place of residence. There is a normal inability to leave the home and doing so requires considerable and taxing effort. Other absences are for medical reasons / religious services and are infrequent or of short duration when for other reasons). o If current dressing causes regression in wound condition, may D/C ordered dressing product/s and apply Normal Saline Moist Dressing daily until next Wound Healing Center / Other MD appointment. Notify Wound Healing Center of regression in wound condition at [phone]. o Please direct any NON-WOUND related issues/requests for orders to patient's Primary Care Physician Wound #2 Right,Medial Toe Third o [facility] - [facility] o Home Health Nurse may visit PRN to address patientos wound care needs. o FACE TO FACE ENCOUNTER: MEDICARE and MEDICAID PATIENTS: I certify that this patient is under my care and that I had a face-to-face encounter that meets the physician face-to-face encounter requirements with this patient on this date. The encounter with the patient was in [place], [place].  ([id number]) whole or in part for the following MEDICAL CONDITION: (primary reason for Home Healthcare) MEDICAL NECESSITY: I certify, that  based on my findings, NURSING services are a medically necessary home health service. HOME BOUND STATUS: I certify that my clinical findings support that this patient is homebound (i.e., Due to illness or injury, pt requires aid of supportive devices such as crutches, cane, wheelchairs, walkers, the use of special transportation or the assistance of another person to leave their place of residence. There is a normal inability to leave the home and doing so requires considerable and taxing effort. Other absences are for medical reasons / religious services and are infrequent or of short duration when for other reasons). o If current dressing causes regression in wound condition, may D/C ordered dressing product/s and apply Normal Saline Moist Dressing daily until next Wound Healing Center / Other MD appointment. Notify Wound Healing Center of regression in wound condition at [phone]. o Please direct any NON-WOUND related issues/requests for orders to patient's Primary Care Physician Wound #3 Right Toe Fourth o [facility] o Home Health Nurse may visit PRN to address patientos wound care needs. o FACE TO FACE ENCOUNTER: MEDICARE and MEDICAID PATIENTS: I certify that this patient is under my care and that I had a face-to-face encounter that meets the physician face-to-face encounter requirements with this patient on this date. The encounter with the patient was in whole or in part for the following MEDICAL CONDITION: (primary reason for Home Healthcare) MEDICAL NECESSITY: I certify, that based on my findings, NURSING services are a medically necessary home health service. HOME BOUND STATUS: I certify that my clinical findings support that this patient is homebound (i.e., Due to illness or injury, pt  requires aid of supportive devices such as crutches, cane, wheelchairs, walkers, the use of special transportation or the assistance of another person to leave their place of residence. There is a normal inability to leave the home and doing so requires considerable and taxing effort. Other absences are for medical reasons / religious services and are infrequent or of short duration when for other reasons). o If current dressing causes regression in wound condition, may D/C ordered dressing product/s and apply Normal Saline Moist Dressing daily until next Wound Healing Center / Other MD appointment. Notify Wound Healing Center of regression in wound condition at [phone]. o Please direct any NON-WOUND related issues/requests for orders to patient's Primary Care Physician Wound #4 Left Toe Great o [facility] o Home Health Nurse may visit PRN to address patientos wound care needs. o FACE TO FACE ENCOUNTER: MEDICARE and MEDICAID PATIENTS: I certify that this patient is under my care and that I had a face-to-face encounter that meets the physician face-to-face encounter requirements with this patient on this date. The encounter with the patient was in whole or in part for the following MEDICAL CONDITION: (primary reason for Home Healthcare) MEDICAL NECESSITY: I certify, that based on my findings, NURSING services are a medically necessary home health service. HOME BOUND STATUS: I certify that my clinical findings support that this patient is homebound (i.e., Due to illness or injury, pt requires aid of supportive devices such as crutches, cane, wheelchairs, walkers, the use of special transportation or the assistance of another person to leave their place of residence. There is a normal inability to leave the home and doing so requires considerable and taxing effort. Other AGGIE, DOUSE ([id number]) absences are for medical reasons /  religious services and are infrequent or of short duration when for other reasons). o If  current dressing causes regression in wound condition, may D/C ordered dressing product/s and apply Normal Saline Moist Dressing daily until next Wound Healing Center / Other MD appointment. Notify Wound Healing Center of regression in wound condition at [phone]. o Please direct any NON-WOUND related issues/requests for orders to patient's Primary Care Physician Wound #5 Left,Medial Toe Second o [facility] o Home Health Nurse may visit PRN to address patientos wound care needs. o FACE TO FACE ENCOUNTER: MEDICARE and MEDICAID PATIENTS: I certify that this patient is under my care and that I had a face-to-face encounter that meets the physician face-to-face encounter requirements with this patient on this date. The encounter with the patient was in whole or in part for the following MEDICAL CONDITION: (primary reason for Home Healthcare) MEDICAL NECESSITY: I certify, that based on my findings, NURSING services are a medically necessary home health service. HOME BOUND STATUS: I certify that my clinical findings support that this patient is homebound (i.e., Due to illness or injury, pt requires aid of supportive devices such as crutches, cane, wheelchairs, walkers, the use of special transportation or the assistance of another person to leave their place of residence. There is a normal inability to leave the home and doing so requires considerable and taxing effort. Other absences are for medical reasons / religious services and are infrequent or of short duration when for other reasons). o If current dressing causes regression in wound condition, may D/C ordered dressing product/s and apply Normal Saline Moist Dressing daily until next Wound Healing Center / Other MD appointment. Notify Wound Healing Center of regression in wound condition at  [phone]. o Please direct any NON-WOUND related issues/requests for orders to patient's Primary Care Physician Wound #6 Sacrum o [facility] o Home Health Nurse may visit PRN to address patientos wound care needs. o FACE TO FACE ENCOUNTER: MEDICARE and MEDICAID PATIENTS: I certify that this patient is under my care and that I had a face-to-face encounter that meets the physician face-to-face encounter requirements with this patient on this date. The encounter with the patient was in whole or in part for the following MEDICAL CONDITION: (primary reason for Home Healthcare) MEDICAL NECESSITY: I certify, that based on my findings, NURSING services are a medically necessary home health service. HOME BOUND STATUS: I certify that my clinical findings support that this patient is homebound (i.e., Due to illness or injury, pt requires aid of supportive devices such as crutches, cane, wheelchairs, walkers, the use of special transportation or the assistance of another person to leave their place of residence. There is a normal inability to leave the home and doing so requires considerable and taxing effort. Other absences are for medical reasons / religious services and are infrequent or of short duration when for other reasons). o If current dressing causes regression in wound condition, may D/C ordered dressing product/s and apply Normal Saline Moist Dressing daily until next Wound Healing Center / Other MD appointment. Notify Wound Healing Center of regression in wound condition at [phone]. o Please direct any NON-WOUND related issues/requests for orders to patient's Primary Care Physician DIAMONDS, LIPPARD ([id number]) Electronic Signature(s) Signed: 10/24/2015 10:05:30 AM By: Elpidio Eric BSN, RN Signed: 10/25/2015 5:01:27 PM By: Baltazar Najjar MD Entered By: Elpidio Eric on 10/24/2015 10:05:30 [name]  ([id number]) -------------------------------------------------------------------------------- Problem List Details Patient Name: KAMEAH, RAWL Date of Service: 10/24/2015 8:45 AM Medical Record Patient Account Number: [id number]  [id number] Number: Treating RN: Clover Mealy, RN, BSN, Rita [birth year] [phone] y.o. Other Clinician: Date of Birth/Sex: Female) Treating ROBSON, MICHAEL Primary Care Physician/Extender: Alyse Low, GERALD Physician: Referring Physician: Mirna Mires Weeks in Treatment: 0 Active Problems ICD-10 Encounter Code Description Active Date Diagnosis I70.234 Atherosclerosis of native arteries of right leg with 10/24/2015 Yes ulceration of heel and midfoot I70.245 Atherosclerosis of native arteries of left leg with ulceration 10/24/2015 Yes of other part of foot L89.152 Pressure ulcer of sacral region, stage 2 10/24/2015 Yes Inactive Problems Resolved Problems Electronic Signature(s) Signed: 10/25/2015 5:01:27 PM By: Baltazar Najjar MD Entered By: Baltazar Najjar on 10/24/2015 12:03:18 [name] ([id number]) -------------------------------------------------------------------------------- Progress Note Details Patient Name: [name] Date of Service: 10/24/2015 8:45 AM Medical Record Patient Account Number: [id number] Number: Treating RN: Clover Mealy, RN, BSN, Rita [birth year] [phone] y.o. Other Clinician: Date of Birth/Sex: Female) Treating ROBSON, MICHAEL Primary Care Physician/Extender: Alyse Low, GERALD Physician: Referring Physician: Mirna Mires Weeks in Treatment: 0 Subjective Chief Complaint Information obtained from Patient 10/24/15 the patient is here for ischemic wounds in the web spaces of her right first second, second third, third fourth toes on the right and second third toes on the left. She also has a pressure area on her buttock History of Present Illness (HPI) 10/24/15; this is a frail 82 year old woman who is here for review of wounds on her  foot. She was hospitalized from 4/1 through 10/10/15 with right foot pain felt to be ischemic. It was also felt that she could have a component of neuropathic pain. She was seen by Dr. Arbie Cookey of vascular surgery noted that her ABI on the right was 0.54 and on the left 1.06 however she had bilateral dampened monophasic wave forms bilaterally. Her right foot was noted to be tender to touch although it was warm. Since she is returned home she has apparently developed some open areas between the toes. She has home health going out there using silver alginate she has also developed a large decubitus ulcer over her coccyx and proximal gluteal area. There are applying silver alginate to this as well. She is not a diabetic but has chronic renal failure on dialysis. She apparently has severe aortic stenosis with an echocardiogram in January 2017 showing an EF of 55-60% valve area of 0.48 cm the patient is in constant pain in the right foot. She is apparently not eating and drinking well and has lost weight to. They are having to crush her medications. She does not get up to ambulate for the last 6 months I am not sure of all the issues here. Wound History Patient presents with 5 open wounds that have been present for approximately month. Patient has been treating wounds in the following manner: alginate. Laboratory tests have not been performed in the last month. Patient reportedly has not tested positive for an antibiotic resistant organism. Patient reportedly has not tested positive for osteomyelitis. Patient reportedly has not had testing performed to evaluate circulation in the legs. Patient experiences the following problems associated with their wounds: infection. Patient History Allergies Sulfa drugs Family History JEWELENE, MAIRENA ([id number]) Cancer - Siblings, Hypertension - Siblings, Kidney Disease, Stroke - Mother, Father, No family history of Diabetes, Heart Disease, Hereditary  Spherocytosis, Lung Disease, Seizures, Thyroid Problems, Tuberculosis. Social History Never smoker, Marital Status - Widowed, Alcohol Use - Never, Drug Use - No History, Caffeine Use - Rarely. Medical History Eyes Patient has history of Cataracts Denies history of Glaucoma, Optic Neuritis Ear/Nose/Mouth/Throat Denies  history of Chronic sinus problems/congestion, Middle ear problems Hematologic/Lymphatic Patient has history of Anemia Respiratory Denies history of Aspiration, Asthma, Chronic Obstructive Pulmonary Disease (COPD), Pneumothorax, Sleep Apnea, Tuberculosis Cardiovascular Patient has history of Arrhythmia, Coronary Artery Disease, Peripheral Arterial Disease, Peripheral Venous Disease Gastrointestinal Denies history of Cirrhosis , Colitis, Crohn s, Hepatitis A, Hepatitis B, Hepatitis C Genitourinary Patient has history of End Stage Renal Disease - mwf Immunological Denies history of Lupus Erythematosus, Raynaud s, Scleroderma Integumentary (Skin) Denies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 tested positive for an antibiotic resistant organism (MRSA, VRE)o No Have you tested positive for osteomyelitis (bone infection)o No Have you had any tests for circulation on your legso No Have you had other problems associated with your woundso Infection Eyes Complaints and Symptoms: Positive for: Glasses / Contacts Medical History: Positive for: Cataracts Negative for: Glaucoma; Optic Neuritis Hematologic/Lymphatic Complaints and Symptoms: Positive for: Bleeding / Clotting Disorders Medical History: Positive for: Anemia Integumentary (Skin) Complaints and Symptoms: Positive for: Wounds Negative for: Bleeding or bruising tendency; Breakdown; Swelling Medical History: Negative for: History of Burn; History of pressure wounds Neurologic AHLIA, LEMANSKI Beard. ([id number]) Complaints and Symptoms: No Complaints or Symptoms Complaints and Symptoms: Negative for: Numbness/parasthesias; Focal/Weakness Medical History: Negative for: Dementia; Neuropathy; Quadriplegia; Paraplegia; Seizure Disorder Constitutional Symptoms (General Health) Complaints and Symptoms: No Complaints or Symptoms Ear/Nose/Mouth/Throat Complaints and Symptoms: No Complaints or Symptoms Medical History: Negative for: Chronic sinus problems/congestion; Middle ear problems Respiratory Complaints and Symptoms: No Complaints or Symptoms Medical History: Negative for: Aspiration; Asthma; Chronic Obstructive Pulmonary Disease (COPD); Pneumothorax; Sleep Apnea; Tuberculosis Cardiovascular Complaints and Symptoms: No Complaints or Symptoms Medical History: Positive for: Arrhythmia; Coronary Artery Disease; Peripheral Arterial Disease; Peripheral Venous Disease Gastrointestinal Complaints and Symptoms: No Complaints or  Symptoms Medical History: Negative for: Cirrhosis ; Colitis; Crohnos; Hepatitis A; Hepatitis B; Hepatitis C Genitourinary AUDRYNA, WENDT. ([id number]) Medical History: Positive for: End Stage Renal Disease - mwf Immunological Complaints and Symptoms: No Complaints or Symptoms Medical History: Negative for: Lupus Erythematosus; Raynaudos; Scleroderma Musculoskeletal Complaints and Symptoms: No Complaints or Symptoms Medical History: Negative for: Gout; Rheumatoid Arthritis; Osteoarthritis; Osteomyelitis Oncologic Complaints and Symptoms: No Complaints or Symptoms Medical History: Negative for: Received Chemotherapy; Received Radiation Psychiatric Complaints and Symptoms: No Complaints or Symptoms Medical History: Negative for: Anorexia/bulimia; Confinement Anxiety HBO Extended History Items Eyes: Cataracts Family and Social History Cancer: Yes - Siblings; Diabetes: No; Heart Disease: No; Hereditary Spherocytosis: No; Hypertension: Yes - Siblings; Kidney Disease: Yes; Lung Disease: No; Seizures: No; Stroke: Yes - Mother, Father; Thyroid Problems: No; Tuberculosis: No; Never smoker; Marital Status - Widowed; Alcohol Use: Never; Drug Use: No History; Caffeine Use: Rarely; Financial Concerns: No; Food, Clothing or Shelter Needs: No; Support System Lacking: No; Transportation Concerns: No; Advanced Directives: No; Living Will: No Electronic Signature(s) Signed: 10/25/2015 3:02:50 PM By: Elpidio Eric BSN, RN Signed: 10/25/2015 5:01:27 PM By: Baltazar Najjar MD VENICIA, VANDALL ([id number]) Entered By: Elpidio Eric on 10/24/2015 08:54:23 [name] ([id number]) -------------------------------------------------------------------------------- SuperBill Details Patient Name: ALIANAH, LOFTON  Beard. Date of Service: 10/24/2015 Medical Record Patient Account Number: [id number] Number: Treating RN: Clover Mealy, RN, BSN, Rita October 06, 1927 [phone] y.o. Other Clinician: Date of  Birth/Sex: Female) Treating ROBSON, MICHAEL Primary Care Physician/Extender: Alyse Low, GERALD Physician: Weeks in Treatment: 0 Referring Physician: HILL, GERALD Diagnosis Coding ICD-10 Codes Code Description I70.234 Atherosclerosis of native arteries of right leg with ulceration of heel and midfoot I70.245 Atherosclerosis of native arteries of left leg with ulceration of other part of foot L89.152 Pressure ulcer of sacral region, stage 2 Facility Procedures CPT4 Code: 27253664 Description: 40347 - WOUND CARE VISIT-LEV 5 EST PT Modifier: Quantity: 1 Physician Procedures CPT4: Description Modifier Quantity Code [id number] - WC PHYS LEVEL 4 - NEW PT 1 ICD-10 Description Diagnosis I70.234 Atherosclerosis of native arteries of right leg with ulceration of heel and midfoot Electronic Signature(s) Signed: 10/25/2015 5:01:27 PM By: Baltazar Najjar MD Entered By: Baltazar Najjar on 10/24/2015 12:48:14

## 2015-10-26 NOTE — Progress Notes (Addendum)
Brittany, Beard ([id number]) Visit Report for 10/24/2015 Allergy List Details Patient Name: [name], [name]. Date of Service: 10/24/2015 8:45 AM Medical Record Patient Account Number: [id number] Number: Treating RN: Clover Mealy, RN, BSN, Rita 01/12/1928 [phone] y.o. Other Clinician: Date of Birth/Sex: Female) Treating ROBSON, MICHAEL Primary Care Physician: HILL, GERALD Physician/Extender: G Referring Physician: HILL, GERALD Weeks in Treatment: 0 Allergies Active Allergies Sulfa drugs Allergy Notes Electronic Signature(s) Signed: 10/25/2015 3:02:50 PM By: Elpidio Eric BSN, RN Entered By: Elpidio Eric on 10/24/2015 08:54:44 [name] ([id number]) -------------------------------------------------------------------------------- Arrival Information Details Patient Name: [name] Date of Service: 10/24/2015 8:45 AM Medical Record Patient Account Number: [id number] Number: Treating RN: Clover Mealy, RN, BSN, Rita 1928/01/07 [phone] y.o. Other Clinician: Date of Birth/Sex: Female) Treating ROBSON, MICHAEL Primary Care Physician: HILL, GERALD Physician/Extender: G Referring Physician: Mirna Mires Weeks in Treatment: 0 Visit Information Patient Arrived: Wheel Chair Arrival Time: 08:45 Accompanied By: dtr Transfer Assistance: EasyPivot Patient Lift Patient Identification Verified: Yes Secondary Verification Process Yes Completed: Patient Requires Transmission- No Based Precautions: Patient Has Alerts: No Electronic Signature(s) Signed: 10/25/2015 3:02:50 PM By: Elpidio Eric BSN, RN Entered By: Elpidio Eric on 10/24/2015 08:45:21 [name] ([id number]) -------------------------------------------------------------------------------- Clinic Level of Care Assessment Details Patient Name: [name] Date of Service: 10/24/2015 8:45 AM Medical Record Patient Account Number: [id number] Number: Treating RN: Clover Mealy, RN, BSN, Rita 1928-01-07 [phone]  y.o. Other Clinician: Date of Birth/Sex: Female) Treating ROBSON, MICHAEL Primary Care Physician: HILL, GERALD Physician/Extender: G Referring Physician: HILL, GERALD Weeks in Treatment: 0 Clinic Level of Care Assessment Items TOOL 2 Quantity Score []  - Use when only an EandM is performed on the INITIAL visit 0 ASSESSMENTS - Nursing Assessment / Reassessment X - General Physical Exam (combine w/ comprehensive assessment (listed just 1 20 below) when performed on new pt. evals) X - Comprehensive Assessment (HX, ROS, Risk Assessments, Wounds Hx, etc.) 1 25 ASSESSMENTS - Wound and Skin Assessment / Reassessment []  - Simple Wound Assessment / Reassessment - one wound 0 X - Complex Wound Assessment / Reassessment - multiple wounds 6 5 []  - Dermatologic / Skin Assessment (not related to wound area) 0 ASSESSMENTS - Ostomy and/or Continence Assessment and Care []  - Incontinence Assessment and Management 0 []  - Ostomy Care Assessment and Management (repouching, etc.) 0 PROCESS - Coordination of Care X - Simple Patient / Family Education for ongoing care 1 15 []  - Complex (extensive) Patient / Family Education for ongoing care 0 X - Staff obtains Chiropractor, Records, Test Results / Process Orders 1 10 X - Staff telephones HHA, Nursing Homes / Clarify orders / etc 1 10 []  - Routine Transfer to another Facility (non-emergent condition) 0 []  - Routine Hospital Admission (non-emergent condition) 0 X - New Admissions / Manufacturing engineer / Ordering NPWT, Apligraf, etc. 1 15 []  - Emergency Hospital Admission (emergent condition) 0 Woehrle, Ethelda V. ([id number]) []  - Simple Discharge Coordination 0 []  - Complex (extensive) Discharge Coordination 0 PROCESS - Special Needs []  - Pediatric / Minor Patient Management 0 []  - Isolation Patient Management 0 []  - Hearing / Language / Visual special needs 0 []  - Assessment of Community assistance (transportation, D/C planning, etc.) 0 []  - Additional  assistance / Altered mentation 0 []  - Support Surface(s) Assessment (bed, cushion, seat, etc.) 0 INTERVENTIONS - Wound Cleansing / Measurement X - Wound Imaging (photographs - any number of wounds) 1 5 []  - Wound Tracing (instead of photographs) 0 []  - Simple Wound Measurement -  one wound 0 X - Complex Wound Measurement - multiple wounds 6 5 []  - Simple Wound Cleansing - one wound 0 X - Complex Wound Cleansing - multiple wounds 6 5 INTERVENTIONS - Wound Dressings []  - Small Wound Dressing one or multiple wounds 0 X - Medium Wound Dressing one or multiple wounds 6 15 []  - Large Wound Dressing one or multiple wounds 0 []  - Application of Medications - injection 0 INTERVENTIONS - Miscellaneous []  - External ear exam 0 []  - Specimen Collection (cultures, biopsies, blood, body fluids, etc.) 0 []  - Specimen(s) / Culture(s) sent or taken to Lab for analysis 0 X - Patient Transfer (multiple staff / Nurse, adult / Similar devices) 1 10 []  - Simple Staple / Suture removal (25 or less) 0 Uresti, Wayne V. ([id number]) []  - Complex Staple / Suture removal (26 or more) 0 []  - Hypo / Hyperglycemic Management (close monitor of Blood Glucose) 0 []  - Ankle / Brachial Index (ABI) - do not check if billed separately 0 Has the patient been seen at the hospital within the last three years: Yes Total Score: 290 Level Of Care: New/Established - Level 5 Electronic Signature(s) Signed: 10/24/2015 10:06:41 AM By: Elpidio Eric BSN, RN Entered By: Elpidio Eric on 10/24/2015 10:06:41 [name] ([id number]) -------------------------------------------------------------------------------- Encounter Discharge Information Details Patient Name: [name] Date of Service: 10/24/2015 8:45 AM Medical Record Patient Account Number: [id number] Number: Treating RN: Clover Mealy, RN, BSN, Rita 04-Apr-1928 [phone] y.o. Other Clinician: Date of Birth/Sex: Female) Treating ROBSON, MICHAEL Primary Care Physician:  HILL, GERALD Physician/Extender: G Referring Physician: HILL, GERALD Weeks in Treatment: 0 Encounter Discharge Information Items Discharge Pain Level: 0 Discharge Condition: Stable Ambulatory Status: Wheelchair Discharge Destination: Home Transportation: Private Auto Accompanied By: dtr Schedule Follow-up Appointment: No Medication Reconciliation completed and provided to Patient/Care No Shailyn Weyandt: Provided on Clinical Summary of Care: 10/24/2015 Form Type Recipient Paper Patient LT Electronic Signature(s) Signed: 10/24/2015 10:08:04 AM By: Elpidio Eric BSN, RN Previous Signature: 10/24/2015 9:58:00 AM Version By: Gwenlyn Perking Entered By: Elpidio Eric on 10/24/2015 10:08:04 [name] ([id number]) -------------------------------------------------------------------------------- Lower Extremity Assessment Details Patient Name: [name] Date of Service: 10/24/2015 8:45 AM Medical Record Patient Account Number: [id number] Number: Treating RN: Clover Mealy, RN, BSN, Rita 20-Mar-1928 [phone] y.o. Other Clinician: Date of Birth/Sex: Female) Treating ROBSON, MICHAEL Primary Care Physician: HILL, GERALD Physician/Extender: G Referring Physician: HILL, GERALD Weeks in Treatment: 0 Edema Assessment Assessed: [Left: No] [Right: No] Edema: [Left: No] [Right: No] Vascular Assessment Pulses: Posterior Tibial Dorsalis Pedis Palpable: [Left:No] [Right:No] Doppler: [Left:Monophasic] [Right:Monophasic] Extremity colors, hair growth, and conditions: Extremity Color: [Left:Normal] [Right:Normal] Hair Growth on Extremity: [Left:No] [Right:No] Temperature of Extremity: [Left:Cool] [Right:Cool] Capillary Refill: [Left:> 3 seconds] [Right:> 3 seconds] Toe Nail Assessment Left: Right: Thick: Yes Yes Discolored: Yes Yes Deformed: No No Improper Length and Hygiene: Yes Yes Electronic Signature(s) Signed: 10/25/2015 3:02:50 PM By: Elpidio Eric BSN, RN Entered By: Elpidio Eric on  10/24/2015 08:55:43 [name] ([id number]) -------------------------------------------------------------------------------- Multi Wound Chart Details Patient Name: [name] Date of Service: 10/24/2015 8:45 AM Medical Record Patient Account Number: [id number] Number: Treating RN: Clover Mealy, RN, BSN, Rita June 21, 1928 [phone] y.o. 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] Comorbid History: [1:Cataracts, Anemia, Arrhythmia, Coronary Artery Disease, Peripheral Artery Disease, Peripheral Artery Disease, Peripheral Arterial Disease, Peripheral Venous Disease, End Stage Renal Disease, End Stage Renal Disease, End Stage Renal  Disease] [2:Cataracts, Anemia, Arrhythmia, Coronary Arterial Disease, Peripheral Venous Disease] [3:Cataracts, Anemia, Arrhythmia, Coronary Arterial Disease, Peripheral Venous Disease] Date Acquired: [1:09/23/2015] [2:09/23/2015] [3:09/23/2015] Weeks of Treatment: [1:0] [2:0] [3:0] Wound Status: [1:Open] [2:Open] [3:Open] Pending Amputation on Yes [2:Yes] [3:Yes] Presentation: Measurements L x W x D 1x1x0.1 [2:1x1x0.1] [3:1.4x1x0.1] (cm) Area (cm) : [1:0.785] [2:0.785] [3:1.1] Volume (cm) : [1:0.079] [2:0.079] [3:0.11] % Reduction in Area: [1:0.00%] [2:N/A] [3:N/A] % Reduction in Volume: 0.00% [2:N/A]  [3:N/A] Classification: [1:Unclassifiable] [2:Unclassifiable] [3:Partial Thickness] Exudate Amount: [1:Small] [2:Medium] [3:Medium] Exudate Type: [1:Serosanguineous] [2:Serosanguineous] [3:Serosanguineous] Exudate Color: [1:red, brown] [2:red, brown] [3:red, brown] Wound Margin: [1:Distinct, outline attached N/A] [3:Distinct, outline attached] Granulation Amount: None Present (0%) None Present (0%) Small (1-33%) Granulation Quality: N/A N/A Pink, Pale Necrotic Amount: Large (67-100%) Large (67-100%) Large (67-100%) Necrotic Tissue: Eschar, Adherent Slough Eschar, Adherent Slough N/A Exposed Structures: Fascia: No Fascia: No Fascia: No Fat: No Fat: No Fat: No Tendon: No Tendon: No Tendon: No Muscle: No Muscle: No Muscle: No Joint: No Joint: No Joint: No Bone: No Bone: No Bone: No Limited to Skin Limited to Skin Limited to Skin Breakdown Breakdown Breakdown Epithelialization: None None None Periwound Skin Texture: Edema: No Edema: No Edema: No Excoriation: No Excoriation: No Excoriation: No Induration: No Induration: No Induration: No Callus: No Callus: No Callus: No Crepitus: No Crepitus: No Crepitus: No Fluctuance: No Fluctuance: No Fluctuance: No Friable: No Friable: No Friable: No Rash: No Rash: No Rash: No Scarring: No Scarring: No Scarring: No Periwound Skin Moist: Yes Moist: Yes Moist: Yes Moisture: Dry/Scaly: Yes Dry/Scaly: Yes Maceration: No Maceration: No Maceration: No Dry/Scaly: No Periwound Skin Color: Atrophie Blanche: No Atrophie Blanche: No Atrophie Blanche: No Cyanosis: No Cyanosis: No Cyanosis: No Ecchymosis: No Ecchymosis: No Ecchymosis: No Erythema: No Erythema: No Erythema: No Hemosiderin Staining: No Hemosiderin Staining: No Hemosiderin Staining: No Mottled: No Mottled: No Mottled: No Pallor: No Pallor: No Pallor: No Rubor: No Rubor: No Rubor: No Temperature: Cool/Cold Cool/Cold Cool/Cold Tenderness on Yes  Yes Yes Palpation: Wound Preparation: Ulcer Cleansing: Ulcer Cleansing: Ulcer Cleansing: Rinsed/Irrigated with Rinsed/Irrigated with Rinsed/Irrigated with Saline Saline Saline Topical Anesthetic Topical Anesthetic Topical Anesthetic Applied: Other: lidocaine Applied: Other: lidocaine Applied: Other: lidocaine 4% 4% 4% Wound Number: Photos: No Photos No Photos No Photos Wound Location: Left Toe Great Left Toe Second - Medial Sacrum Wounding Event: Gradually Appeared Gradually Appeared Gradually Appeared 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 Comorbid History: TERRINA, DOCTER ([id number]) Cataracts, Anemia, Cataracts, Anemia, Cataracts, Anemia, Arrhythmia, Coronary Arrhythmia, Coronary Arrhythmia, Coronary Artery Disease, Peripheral Artery Disease, Peripheral Artery Disease, Peripheral Arterial Disease, Arterial Disease, Arterial Disease, Peripheral Venous Peripheral Venous Peripheral Venous Disease, End Stage Renal Disease, End Stage Renal Disease, End Stage Renal Disease Disease Disease Date Acquired: 09/23/2015 09/23/2015 09/29/2015 Weeks of Treatment: 0 0 0 Wound Status: Open Open Open Pending Amputation on No No No Presentation: Measurements L x W x D 1x1x0.1 1x0.8x0.1 9x9x0.1 (cm) Area (cm) : 0.785 0.628 63.617 Volume (cm) : 0.079 0.063 6.362 % Reduction in Area: N/A N/A N/A % Reduction in Volume: N/A N/A N/A Classification: Unclassifiable Partial Thickness Unstageable/Unclassified  Exudate Amount: Small Small Large Exudate Type: Serosanguineous Serosanguineous Serosanguineous Exudate Color: red, brown red, brown red, brown Wound Margin: Indistinct, nonvisible Indistinct, nonvisible Distinct, outline attached Granulation Amount: None Present (0%) None Present (0%) Small (1-33%) Granulation Quality: N/A N/A Pink, Pale Necrotic Amount: Large (67-100%) Large (67-100%) Large (67-100%) Necrotic Tissue: Eschar Eschar,  Adherent Slough Eschar, Adherent Slough Exposed Structures: Fascia: No Fascia: No Fascia: No Fat: No Fat: No Fat: No Tendon: No Tendon: No Tendon: No Muscle: No Muscle: No Muscle: No Joint: No Joint: No Joint: No Bone: No Bone: No Bone: No Limited to Skin Limited to Skin Limited to Skin Breakdown Breakdown Breakdown Epithelialization: None None None Periwound Skin Texture: Edema: No Edema: No Edema: No Excoriation: No Excoriation: No Excoriation: No Induration: No Induration: No Induration: No Callus: No Callus: No Callus: No Crepitus: No Crepitus: No Crepitus: No Fluctuance: No Fluctuance: No Fluctuance: No Friable: No Friable: No Friable: No Rash: No Rash: No Rash: No Scarring: No Scarring: No Scarring: No Periwound Skin Moist: Yes Moist: Yes Moist: Yes Moisture: Maceration: No Maceration: No Maceration: No Dry/Scaly: No Dry/Scaly: No Dry/Scaly: No Periwound Skin Color: Atrophie Blanche: No Atrophie Blanche: No Atrophie Blanche: No Cyanosis: No Cyanosis: No Cyanosis: No ZENOLA, DEZARN ([id number]) Ecchymosis: No Ecchymosis: No Ecchymosis: No Erythema: No Erythema: No Erythema: No Hemosiderin Staining: No Hemosiderin Staining: No Hemosiderin Staining: No Mottled: No Mottled: No Mottled: No Pallor: No Pallor: No Pallor: No Rubor: No Rubor: No Rubor: No Temperature: No Abnormality Cool/Cold No Abnormality Tenderness on Yes Yes Yes Palpation: Wound Preparation: Ulcer Cleansing: Ulcer Cleansing: Ulcer Cleansing: Rinsed/Irrigated with Rinsed/Irrigated with Rinsed/Irrigated with Saline Saline Saline Topical Anesthetic Topical Anesthetic Topical Anesthetic Applied: Other: lidocaine Applied: Other: lidocaine Applied: Other: lidocaine 4% 4% 4% Treatment Notes Electronic Signature(s) Signed: 10/25/2015 3:02:50 PM By: Elpidio Eric BSN, RN Entered By: Elpidio Eric on 10/24/2015 09:32:45 [name]  ([id number]) -------------------------------------------------------------------------------- Multi-Disciplinary Care Plan Details Patient Name: [name] Date of Service: 10/24/2015 8:45 AM Medical Record Patient Account Number: [id number] Number: Treating RN: Clover Mealy, RN, BSN, Rita [birth year] [phone] y.o. Other Clinician: Date of Birth/Sex: Female) Treating ROBSON, MICHAEL Primary Care Physician: Mirna Mires Physician/Extender: G Referring Physician: Zorita Pang in Treatment: 0 Active Inactive Electronic Signature(s) Signed: 12/01/2015 10:10:39 AM By: Elliot Gurney RN, BSN, Kim RN, BSN Signed: 03/05/2016 3:32:01 PM By: Elpidio Eric BSN, RN Previous Signature: 10/25/2015 3:02:50 PM Version By: Elpidio Eric BSN, RN Entered By: Elliot Gurney, RN, BSN, Kim on 11/08/2015 17:18:36 [name] ([id number]) -------------------------------------------------------------------------------- Pain Assessment Details Patient Name: SHAKERA, EBRAHIMI Date of Service: 10/24/2015 8:45 AM Medical Record Patient Account Number: [id number] Number: Treating RN: Clover Mealy, RN, BSN, Rita 1928/02/28 [phone] y.o. Other Clinician: Date of Birth/Sex: Female) Treating ROBSON, MICHAEL Primary Care Physician: HILL, GERALD Physician/Extender: G Referring Physician: HILL, GERALD Weeks in Treatment: 0 Active Problems Location of Pain Severity and Description of Pain Patient Has Paino Yes Site Locations Pain Location: Generalized Pain, Pain in Ulcers Rate the pain. Current Pain Level: 6 Character of Pain Describe the Pain: Aching Pain Management and Medication Current Pain Management: Medication: Yes Cold Application: Yes Rest: Yes Massage: Yes Activity: Yes T.E.N.S.: Yes Heat Application: Yes Leg drop or elevation: Yes Is the Current Pain Management Adequate Adequate: How does your pain impact your activities of daily livingo Sleep: Yes Bathing: Yes Appetite: Yes Relationship With  Others: Yes Bladder Continence: Yes Emotions: Yes Bowel Continence: Yes Work: Yes Toileting: Yes Drive: Yes Dressing: Yes Hobbies: Yes Busby, Shakeitha  Seth Bake ([id number]) Electronic Signature(s) Signed: 10/25/2015 3:02:50 PM By: Elpidio Eric BSN, RN Entered By: Elpidio Eric on 10/24/2015 08:47:20 [name] ([id number]) -------------------------------------------------------------------------------- Patient/Caregiver Education Details Patient Name: [name] Date of Service: 10/24/2015 8:45 AM Medical Record Patient Account Number: [id number] Number: Treating RN: Clover Mealy, RN, BSN, Rita 1928/04/14 [phone] y.o. Other Clinician: Date of Birth/Gender: Female) Treating ROBSON, MICHAEL Primary Care Physician: HILL, GERALD Physician/Extender: G Referring Physician: Zorita Pang in Treatment: 0 Education Assessment Education Provided To: Patient and Caregiver dtr Education Topics Provided Pain: Methods: Explain/Verbal Responses: Return demonstration correctly, State content correctly Pressure: Methods: Explain/Verbal Responses: State content correctly [facility]: Methods: Explain/Verbal Responses: State content correctly Wound/Skin Impairment: Methods: Explain/Verbal Responses: State content correctly Electronic Signature(s) Signed: 10/24/2015 10:08:38 AM By: Elpidio Eric BSN, RN Entered By: Elpidio Eric on 10/24/2015 10:08:37 [name] ([id number]) -------------------------------------------------------------------------------- Wound Assessment Details Patient Name: [name] Date of Service: 10/24/2015 8:45 AM Medical Record Patient Account Number: [id number] Number: Treating RN: Clover Mealy, RN, BSN, Rita [birth year] [phone] y.o. Other Clinician: Date of Birth/Sex: Female) Treating ROBSON, MICHAEL Primary Care Physician: HILL, GERALD Physician/Extender: G Referring Physician: HILL, GERALD Weeks in Treatment: 0 Wound  Status Wound Number: 1 Primary Arterial Insufficiency Ulcer Etiology: Wound Location: Right Toe Second - Medial Wound Open Wounding Event: Gradually Appeared Status: Date Acquired: 09/23/2015 Comorbid Cataracts, Anemia, Arrhythmia, Weeks Of Treatment: 0 History: Coronary Artery Disease, Peripheral Clustered Wound: No Arterial Disease, Peripheral Venous Pending Amputation On Presentation Disease, End Stage Renal Disease Wound Measurements Length: (cm) 1 Width: (cm) 1 Depth: (cm) 0.1 Area: (cm) 0.785 Volume: (cm) 0.079 % Reduction in Area: 0% % Reduction in Volume: 0% Epithelialization: None Tunneling: No Undermining: No Wound Description Classification: Unclassifiable Wound Margin: Distinct, outline attached Exudate Amount: Small Exudate Type: Serosanguineous Exudate Color: red, brown Foul Odor After Cleansing: No Wound Bed Granulation Amount: None Present (0%) Exposed Structure Necrotic Amount: Large (67-100%) Fascia Exposed: No Necrotic Quality: Eschar, Adherent Slough Fat Layer Exposed: No Tendon Exposed: No Muscle Exposed: No Joint Exposed: No Bone Exposed: No Limited to Skin Breakdown Periwound Skin Texture Texture Color No Abnormalities Noted: No No Abnormalities Noted: No LACIE, LANDRY. ([id number]) Callus: No Atrophie Blanche: No Crepitus: No Cyanosis: No Excoriation: No Ecchymosis: No Fluctuance: No Erythema: No Friable: No Hemosiderin Staining: No Induration: No Mottled: No Localized Edema: No Pallor: No Rash: No Rubor: No Scarring: No Temperature / Pain Moisture Temperature: Cool/Cold No Abnormalities Noted: No Tenderness on Palpation: Yes Dry / Scaly: Yes Maceration: No Moist: Yes Wound Preparation Ulcer Cleansing: Rinsed/Irrigated with Saline Topical Anesthetic Applied: Other: lidocaine 4%, Electronic Signature(s) Signed: 10/24/2015 9:13:02 AM By: Elpidio Eric BSN, RN Entered By: Elpidio Eric on 10/24/2015 09:13:02 [name] ([id number]) -------------------------------------------------------------------------------- Wound Assessment Details Patient Name: [name] Date of Service: 10/24/2015 8:45 AM Medical Record Patient Account Number: [id number] Number: Treating RN: Clover Mealy, RN, BSN, Rita 16-Dec-1927 [phone] y.o. Other Clinician: Date of Birth/Sex: Female) Treating ROBSON, MICHAEL Primary Care Physician: HILL, GERALD Physician/Extender: G Referring Physician: HILL, GERALD Weeks in Treatment: 0 Wound Status Wound Number: 2 Primary Arterial Insufficiency Ulcer Etiology: Wound Location: Right Toe Third - Medial Wound Open Wounding Event: Gradually Appeared Status: Date Acquired: 09/23/2015 Comorbid Cataracts, Anemia, Arrhythmia, Weeks Of Treatment: 0 History: Coronary Artery Disease, Peripheral Clustered Wound: No Arterial Disease, Peripheral Venous Pending Amputation On Presentation Disease, End Stage Renal Disease Photos Photo Uploaded By: Elpidio Eric on 10/24/2015 16:20:45 Wound Measurements Length: (cm) 1  Width: (cm) 1 Depth: (cm) 0.1 Area: (cm) 0.785 Volume: (cm) 0.079 % Reduction in Area: % Reduction in Volume: Epithelialization: None Tunneling: No Undermining: No Wound Description Classification: Unclassifiable Exudate Amount: Medium Exudate Type: Serosanguineous Exudate Color: red, brown Foul Odor After Cleansing: No Wound Bed Granulation Amount: None Present (0%) Exposed Structure Necrotic Amount: Large (67-100%) Fascia Exposed: No ZUNAIRAH, DEVERS ([id number]) Necrotic Quality: Eschar, Adherent Slough Fat Layer Exposed: No Tendon Exposed: No Muscle Exposed: No Joint Exposed: No Bone Exposed: No Limited to Skin Breakdown Periwound Skin Texture Texture Color No Abnormalities Noted: No No Abnormalities Noted: No Callus: No Atrophie Blanche: No Crepitus: No Cyanosis: No Excoriation: No Ecchymosis: No Fluctuance: No Erythema: No Friable:  No Hemosiderin Staining: No Induration: No Mottled: No Localized Edema: No Pallor: No Rash: No Rubor: No Scarring: No Temperature / Pain Moisture Temperature: Cool/Cold No Abnormalities Noted: No Tenderness on Palpation: Yes Dry / Scaly: Yes Maceration: No Moist: Yes Wound Preparation Ulcer Cleansing: Rinsed/Irrigated with Saline Topical Anesthetic Applied: Other: lidocaine 4%, Electronic Signature(s) Signed: 10/24/2015 9:14:56 AM By: Elpidio Eric BSN, RN Entered By: Elpidio Eric on 10/24/2015 09:14:56 [name] ([id number]) -------------------------------------------------------------------------------- Wound Assessment Details Patient Name: [name] Date of Service: 10/24/2015 8:45 AM Medical Record Patient Account Number: [id number] Number: Treating RN: Clover Mealy, RN, BSN, Rita 11/27/1927 [phone] y.o. Other Clinician: Date of Birth/Sex: Female) Treating ROBSON, MICHAEL Primary Care Physician: HILL, GERALD Physician/Extender: G Referring Physician: HILL, GERALD Weeks in Treatment: 0 Wound Status Wound Number: 3 Primary Arterial Insufficiency Ulcer Etiology: Wound Location: Right Toe Fourth Wound Open Wounding Event: Gradually Appeared Status: Date Acquired: 09/23/2015 Comorbid Cataracts, Anemia, Arrhythmia, Weeks Of Treatment: 0 History: Coronary Artery Disease, Peripheral Clustered Wound: No Arterial Disease, Peripheral Venous Pending Amputation On Presentation Disease, End Stage Renal Disease Photos Photo Uploaded By: Elpidio Eric on 10/24/2015 16:20:45 Wound Measurements Length: (cm) 1.4 Width: (cm) 1 Depth: (cm) 0.1 Area: (cm) 1.1 Volume: (cm) 0.11 % Reduction in Area: % Reduction in Volume: Epithelialization: None Tunneling: No Undermining: No Wound Description Classification: Partial Thickness Wound Margin: Distinct, outline attached Exudate Amount: Medium Exudate Type: Serosanguineous Exudate Color: red, brown Foul Odor  After Cleansing: No Wound Bed Granulation Amount: Small (1-33%) Exposed Structure Bascomb, Adelita V. ([id number]) Granulation Quality: Pink, Pale Fascia Exposed: No Necrotic Amount: Large (67-100%) Fat Layer Exposed: No Necrotic Quality: Bone Tendon Exposed: No Muscle Exposed: No Joint Exposed: No Bone Exposed: No Limited to Skin Breakdown Periwound Skin Texture Texture Color No Abnormalities Noted: No No Abnormalities Noted: No Callus: No Atrophie Blanche: No Crepitus: No Cyanosis: No Excoriation: No Ecchymosis: No Fluctuance: No Erythema: No Friable: No Hemosiderin Staining: No Induration: No Mottled: No Localized Edema: No Pallor: No Rash: No Rubor: No Scarring: No Temperature / Pain Moisture Temperature: Cool/Cold No Abnormalities Noted: No Tenderness on Palpation: Yes Dry / Scaly: No Maceration: No Moist: Yes Wound Preparation Ulcer Cleansing: Rinsed/Irrigated with Saline Topical Anesthetic Applied: Other: lidocaine 4%, Electronic Signature(s) Signed: 10/24/2015 9:16:45 AM By: Elpidio Eric BSN, RN Entered By: Elpidio Eric on 10/24/2015 09:16:45 [name] ([id number]) -------------------------------------------------------------------------------- Wound Assessment Details Patient Name: [name] Date of Service: 10/24/2015 8:45 AM Medical Record Patient Account Number: [id number] Number: Treating RN: Clover Mealy, RN, BSN, Rita 05-13-1928 [phone] y.o. 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 Great Wound Open  Wounding Event: Gradually Appeared Status: Date Acquired: 09/23/2015 Comorbid Cataracts, Anemia, Arrhythmia, Weeks Of Treatment: 0 History: Coronary Artery Disease, Peripheral Clustered  Wound: No Arterial Disease, Peripheral Venous Disease, End Stage Renal Disease Photos Photo Uploaded By: Elpidio EricAfful, Rita on 10/24/2015 16:22:08 Wound Measurements Length: (cm) 1 Width: (cm) 1 Depth: (cm) 0.1 Area: (cm) 0.785 Volume: (cm) 0.079 % Reduction in Area: % Reduction in Volume: Epithelialization: None Tunneling: No Undermining: No Wound Description Classification: Unclassifiable Wound Margin: Indistinct, nonvisible Exudate Amount: Small Exudate Type: Serosanguineous Exudate Color: red, brown Foul Odor After Cleansing: No Wound Bed Granulation Amount: None Present (0%) Exposed Structure Loder, Skyelyn V. ([id number]) Necrotic Amount: Large (67-100%) Fascia Exposed: No Necrotic Quality: Eschar Fat Layer Exposed: No Tendon Exposed: No Muscle Exposed: No Joint Exposed: No Bone Exposed: No Limited to Skin Breakdown Periwound Skin Texture Texture Color No Abnormalities Noted: No No Abnormalities Noted: No Callus: No Atrophie Blanche: No Crepitus: No Cyanosis: No Excoriation: No Ecchymosis: No Fluctuance: No Erythema: No Friable: No Hemosiderin Staining: No Induration: No Mottled: No Localized Edema: No Pallor: No Rash: No Rubor: No Scarring: No Temperature / Pain Moisture Temperature: No Abnormality No Abnormalities Noted: No Tenderness on Palpation: Yes Dry / Scaly: No Maceration: No Moist: Yes Wound Preparation Ulcer Cleansing: Rinsed/Irrigated with Saline Topical Anesthetic Applied: Other: lidocaine 4%, Electronic Signature(s) Signed: 10/24/2015 9:18:51 AM By: Elpidio EricAfful, Rita BSN, RN Entered By: Elpidio EricAfful, Rita on 10/24/2015 09:18:51 Brittany BadgerHOMAS, Elika V. ([id number]) -------------------------------------------------------------------------------- Wound Assessment Details Patient Name: Brittany BadgerHOMAS, Ramiyah V. Date of Service: 10/24/2015 8:45 AM Medical Record Patient Account Number: [id number] Number: Treating RN: Clover MealyAfful, RN, BSN, Rita 11/14/1927  [phone](82 y.o. 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 Second - Medial Wound Open Wounding Event: Gradually Appeared Status: Date Acquired: 09/23/2015 Comorbid Cataracts, Anemia, Arrhythmia, Weeks Of Treatment: 0 History: Coronary Artery Disease, Peripheral Clustered Wound: No Arterial Disease, Peripheral Venous Disease, End Stage Renal Disease Photos Photo Uploaded By: Elpidio EricAfful, Rita on 10/24/2015 16:22:09 Wound Measurements Length: (cm) 1 Width: (cm) 0.8 Depth: (cm) 0.1 Area: (cm) 0.628 Volume: (cm) 0.063 % Reduction in Area: % Reduction in Volume: Epithelialization: None Tunneling: No Undermining: No Wound Description Classification: Partial Thickness Wound Margin: Indistinct, nonvisible Exudate Amount: Small Exudate Type: Serosanguineous Exudate Color: red, brown Foul Odor After Cleansing: No Wound Bed Granulation Amount: None Present (0%) Exposed Structure Brittany BadgerHOMAS, Tambra V. ([id number]) Necrotic Amount: Large (67-100%) Fascia Exposed: No Necrotic Quality: Eschar, Adherent Slough Fat Layer Exposed: No Tendon Exposed: No Muscle Exposed: No Joint Exposed: No Bone Exposed: No Limited to Skin Breakdown Periwound Skin Texture Texture Color No Abnormalities Noted: No No Abnormalities Noted: No Callus: No Atrophie Blanche: No Crepitus: No Cyanosis: No Excoriation: No Ecchymosis: No Fluctuance: No Erythema: No Friable: No Hemosiderin Staining: No Induration: No Mottled: No Localized Edema: No Pallor: No Rash: No Rubor: No Scarring: No Temperature / Pain Moisture Temperature: Cool/Cold No Abnormalities Noted: No Tenderness on Palpation: Yes Dry / Scaly: No Maceration: No Moist: Yes Wound Preparation Ulcer  Cleansing: Rinsed/Irrigated with Saline Topical Anesthetic Applied: Other: lidocaine 4%, Electronic Signature(s) Signed: 10/24/2015 9:21:08 AM By: Elpidio EricAfful, Rita BSN, RN Entered By: Elpidio EricAfful, Rita on 10/24/2015 09:21:08 Brittany BadgerHOMAS, Carri V. ([id number]) -------------------------------------------------------------------------------- Wound Assessment Details Patient Name: Brittany BadgerHOMAS, Jesalyn V. Date of Service: 10/24/2015 8:45 AM Medical Record Patient Account Number: [id number] Number: Treating RN: Afful,  RN, BSN, [place] Sep 03, 1927 (82 y.o. Other Clinician: Date of Birth/Sex: Female) Treating ROBSON, MICHAEL Primary Care Physician: HILL, GERALD Physician/Extender: G Referring Physician: HILL, GERALD Weeks in Treatment: 0 Wound Status Wound Number: 6 Primary Pressure Ulcer Etiology: Wound Location: Sacrum Wound Open Wounding Event: Gradually Appeared Status: Date Acquired: 09/29/2015 Comorbid Cataracts, Anemia, Arrhythmia, Weeks Of Treatment: 0 History: Coronary Artery Disease, Peripheral Clustered Wound: No Arterial Disease, Peripheral Venous Disease, End Stage Renal Disease Photos Photo Uploaded By: Elpidio Eric on 10/24/2015 16:23:53 Wound Measurements Length: (cm) 9 % Reduction in Width: (cm) 9 % Reduction in Depth: (cm) 0.1 Epithelializat Area: (cm) 63.617 Tunneling: Volume: (cm) 6.362 Undermining: Area: Volume: ion: None No No Wound Description Classification: Unstageable/Unclassified Wound Margin: Distinct, outline attached Exudate Amount: Large Exudate Type: Serosanguineous Exudate Color: red, brown Foul Odor After Cleansing: No Wound Bed Granulation Amount: Small (1-33%) Exposed Structure LAURALEI, CLOUSE ([id number]) Granulation Quality: Pink, Pale Fascia Exposed: No Necrotic Amount: Large (67-100%) Fat Layer Exposed: No Necrotic Quality: Eschar, Adherent Slough Tendon Exposed: No Muscle Exposed: No Joint Exposed: No Bone Exposed: No Limited to  Skin Breakdown Periwound Skin Texture Texture Color No Abnormalities Noted: No No Abnormalities Noted: No Callus: No Atrophie Blanche: No Crepitus: No Cyanosis: No Excoriation: No Ecchymosis: No Fluctuance: No Erythema: No Friable: No Hemosiderin Staining: No Induration: No Mottled: No Localized Edema: No Pallor: No Rash: No Rubor: No Scarring: No Temperature / Pain Moisture Temperature: No Abnormality No Abnormalities Noted: No Tenderness on Palpation: Yes Dry / Scaly: No Maceration: No Moist: Yes Wound Preparation Ulcer Cleansing: Rinsed/Irrigated with Saline Topical Anesthetic Applied: Other: lidocaine 4%, Electronic Signature(s) Signed: 10/24/2015 9:22:40 AM By: Elpidio Eric BSN, RN Entered By: Elpidio Eric on 10/24/2015 09:22:40 [name] ([id number]) -------------------------------------------------------------------------------- Vitals Details Patient Name: [name] Date of Service: 10/24/2015 8:45 AM Medical Record Patient Account Number: [id number] Number: Treating RN: Clover Mealy, RN, BSN, Rita 05-22-1928 [phone] y.o. Other Clinician: Date of Birth/Sex: Female) Treating ROBSON, MICHAEL Primary Care Physician: HILL, GERALD Physician/Extender: G Referring Physician: HILL, GERALD Weeks in Treatment: 0 Vital Signs Time Taken: 08:47 Temperature (F): 98.0 Pulse (bpm): 79 Respiratory Rate (breaths/min): 16 Blood Pressure (mmHg): 123/53 Reference Range: 80 - 120 mg / dl Electronic Signature(s) Signed: 10/25/2015 3:02:50 PM By: Elpidio Eric BSN, RN Entered By: Elpidio Eric on 10/24/2015 08:47:58

## 2015-10-27 DIAGNOSIS — Z4800 Encounter for change or removal of nonsurgical wound dressing: Secondary | ICD-10-CM | POA: Diagnosis not present

## 2015-10-27 DIAGNOSIS — M6281 Muscle weakness (generalized): Secondary | ICD-10-CM | POA: Diagnosis not present

## 2015-10-27 DIAGNOSIS — Z7901 Long term (current) use of anticoagulants: Secondary | ICD-10-CM | POA: Diagnosis not present

## 2015-10-27 DIAGNOSIS — Z7982 Long term (current) use of aspirin: Secondary | ICD-10-CM | POA: Diagnosis not present

## 2015-10-27 DIAGNOSIS — N186 End stage renal disease: Secondary | ICD-10-CM | POA: Diagnosis not present

## 2015-10-27 DIAGNOSIS — L89892 Pressure ulcer of other site, stage 2: Secondary | ICD-10-CM | POA: Diagnosis not present

## 2015-10-27 DIAGNOSIS — Z7689 Persons encountering health services in other specified circumstances: Secondary | ICD-10-CM | POA: Diagnosis not present

## 2015-10-27 DIAGNOSIS — Z992 Dependence on renal dialysis: Secondary | ICD-10-CM | POA: Diagnosis not present

## 2015-10-30 DIAGNOSIS — M6281 Muscle weakness (generalized): Secondary | ICD-10-CM | POA: Diagnosis not present

## 2015-10-31 ENCOUNTER — Ambulatory Visit: Admitting: Internal Medicine

## 2015-10-31 DIAGNOSIS — N186 End stage renal disease: Secondary | ICD-10-CM | POA: Diagnosis not present

## 2015-11-01 DIAGNOSIS — L89892 Pressure ulcer of other site, stage 2: Secondary | ICD-10-CM | POA: Diagnosis not present

## 2015-11-01 DIAGNOSIS — M6281 Muscle weakness (generalized): Secondary | ICD-10-CM | POA: Diagnosis not present

## 2015-11-01 DIAGNOSIS — Z4800 Encounter for change or removal of nonsurgical wound dressing: Secondary | ICD-10-CM | POA: Diagnosis not present

## 2015-11-01 DIAGNOSIS — Z7982 Long term (current) use of aspirin: Secondary | ICD-10-CM | POA: Diagnosis not present

## 2015-11-06 DIAGNOSIS — 419620001 Death: Secondary | SNOMED CT | POA: Diagnosis not present

## 2015-11-06 DEATH — deceased

## 2015-11-07 ENCOUNTER — Ambulatory Visit: Payer: Self-pay | Admitting: *Deleted

## 2015-11-07 DIAGNOSIS — Z5181 Encounter for therapeutic drug level monitoring: Secondary | ICD-10-CM

## 2015-11-07 DIAGNOSIS — I480 Paroxysmal atrial fibrillation: Secondary | ICD-10-CM

## 2015-11-13 ENCOUNTER — Encounter

## 2016-05-05 IMAGING — CT CT HEAD W/O CM
1 series · 16 of 30 positions shown, 20 images · non-contrast
Comparison: 12/07/2014

CLINICAL DATA: Altered mental status. Diffuse weakness and slurred
speech. Dialysis patient

EXAM:
CT HEAD WITHOUT CONTRAST
TECHNIQUE: Contiguous axial images were obtained from the base of the skull
through the vertex without intravenous contrast.

[Series 2: headtrauma 4.8 h37s · axial · 0.43mm/px · z∈[+1349,+1484]mm · 16 of 30 slices shown, 20 images]
[im 2/30  brain]
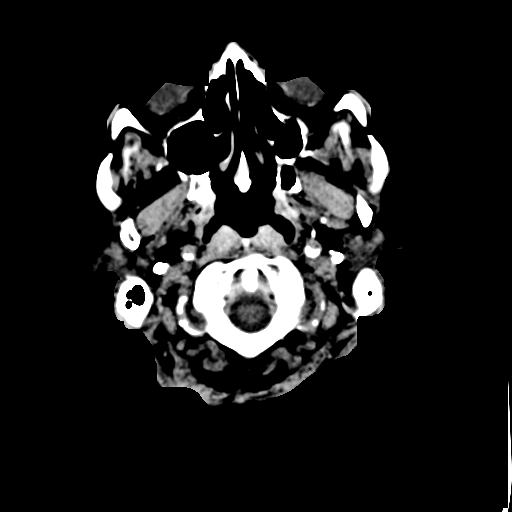
[im 2/30  bone]
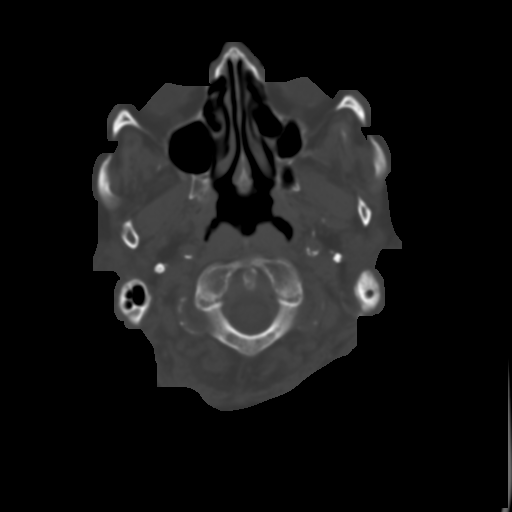
[im 4/30  brain]
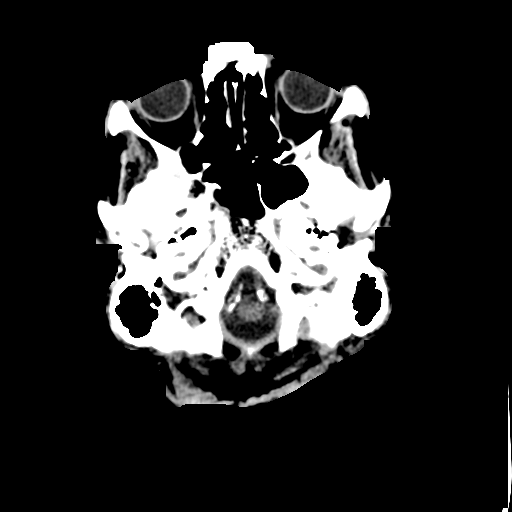
[im 6/30  brain]
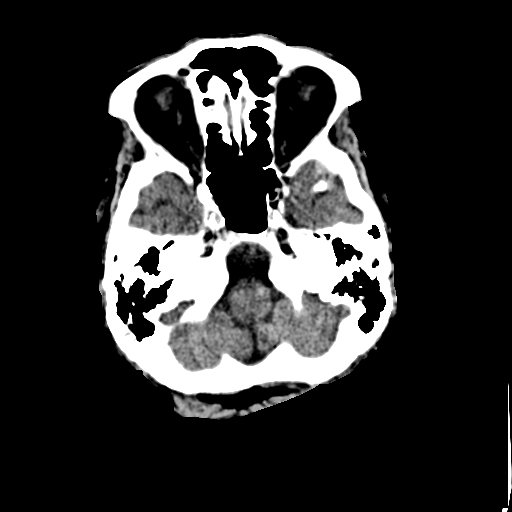
[im 8/30  brain]
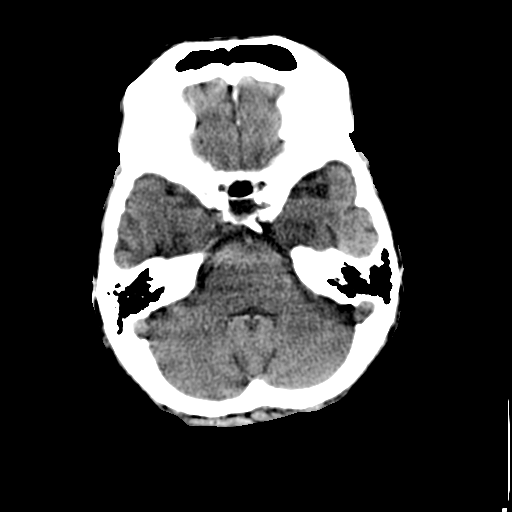
[im 9/30  brain]
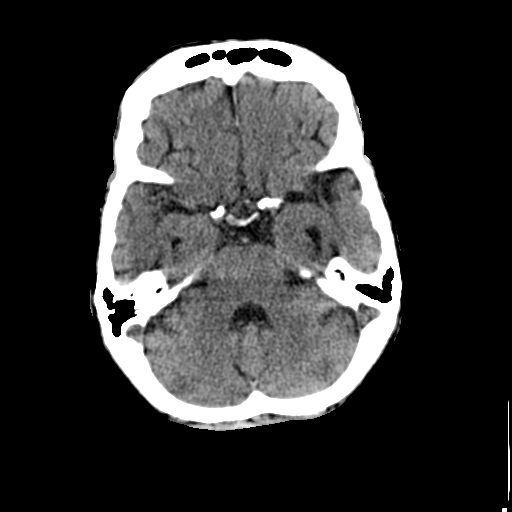
[im 9/30  bone]
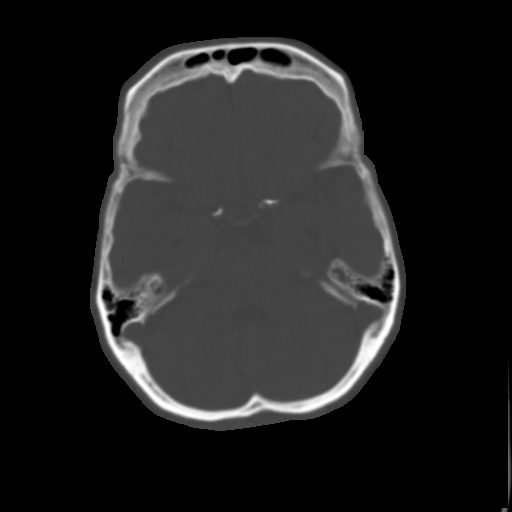
[im 11/30  brain]
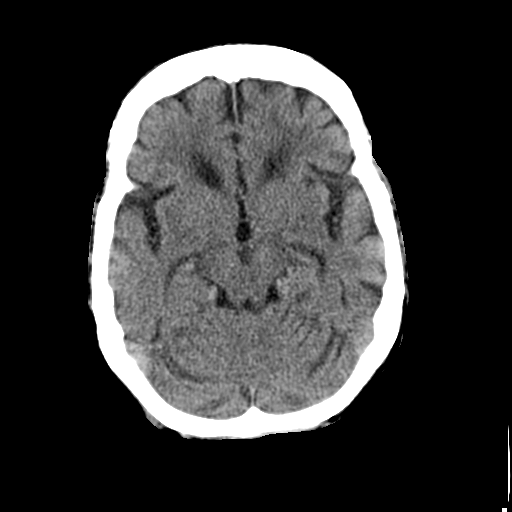
[im 13/30  brain]
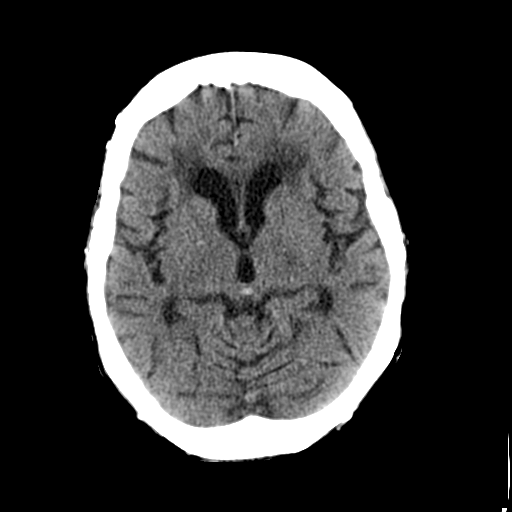
[im 15/30  brain]
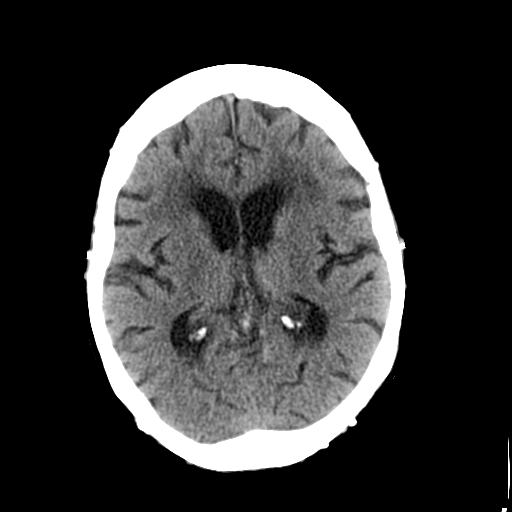
[im 16/30  brain]
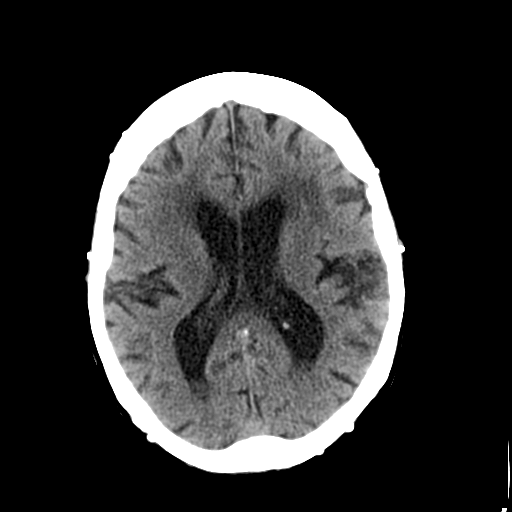
[im 16/30  bone]
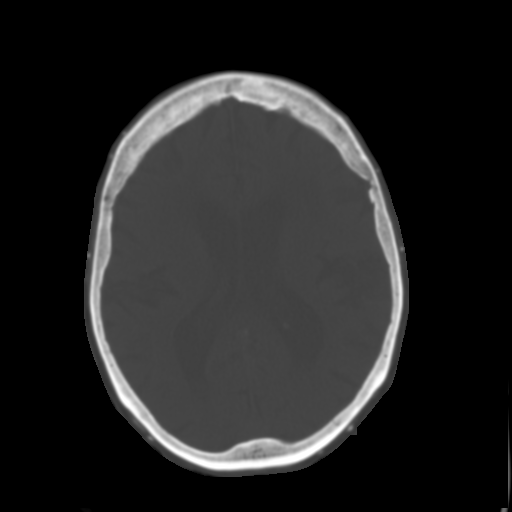
[im 18/30  brain]
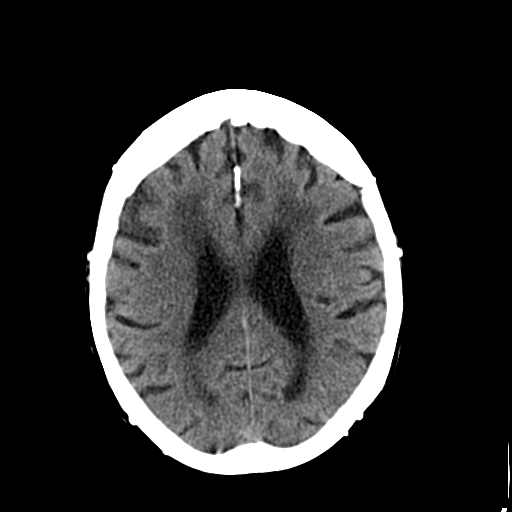
[im 20/30  brain]
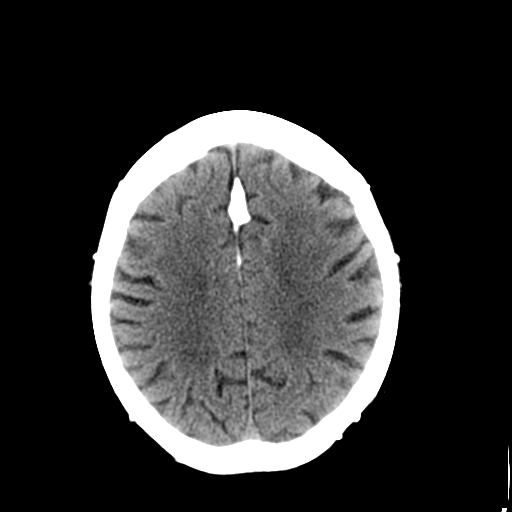
[im 22/30  brain]
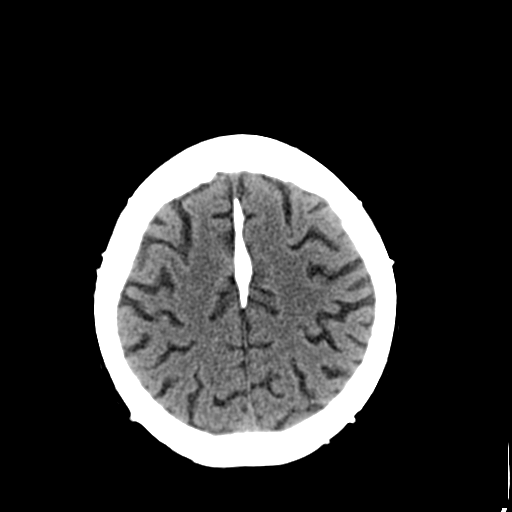
[im 23/30  brain]
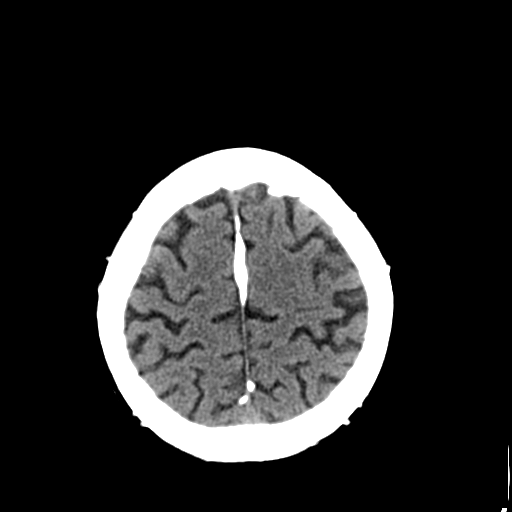
[im 23/30  bone]
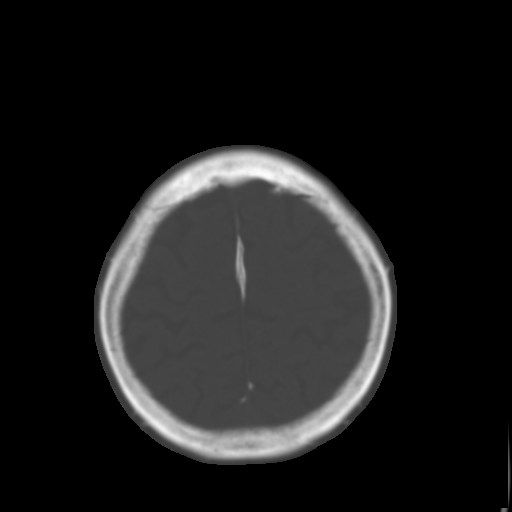
[im 25/30  brain]
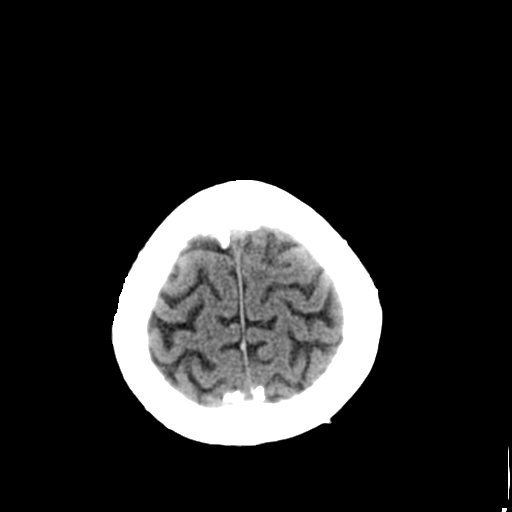
[im 27/30  brain]
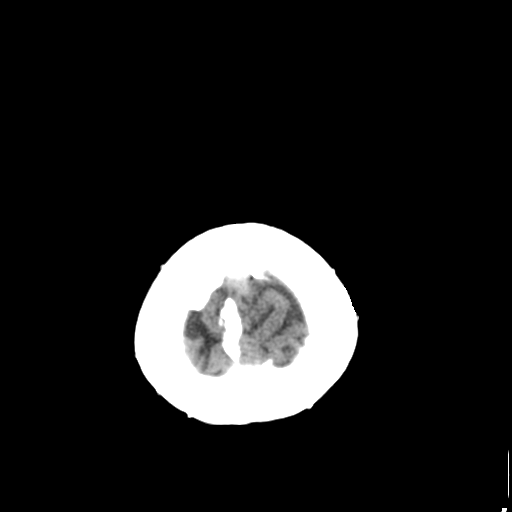
[im 29/30  brain]
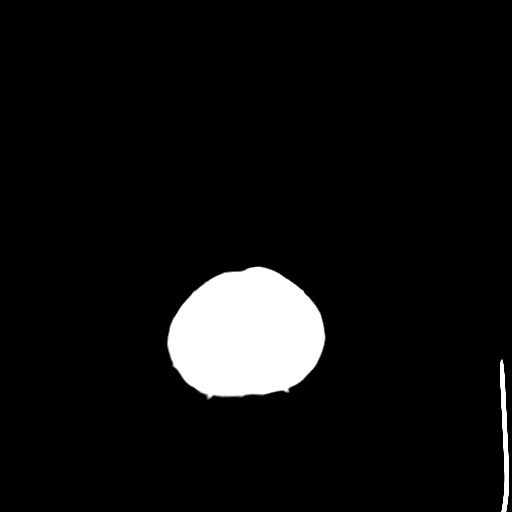

[16 of 30 positions shown; findings below may reference images not displayed]

FINDINGS: Moderate atrophy. Diffuse white matter hypodensity most consistent
with chronic microvascular ischemia unchanged. No acute infarct.
Negative for hemorrhage or mass.

Calvarium intact.  Diffuse atherosclerotic disease.
IMPRESSION: Atrophy and chronic microvascular ischemia.  No acute abnormality

## 2016-05-07 IMAGING — CR DG FOOT 2V*R*
2 series · 2 of 2 positions shown · non-contrast
Comparison: Radiographs 12/07/2014 and 10/07/2015.

CLINICAL DATA: Second toe ulcer.  No known injury.

EXAM:
RIGHT FOOT - 2 VIEW

[view not recorded (1 of 2)]
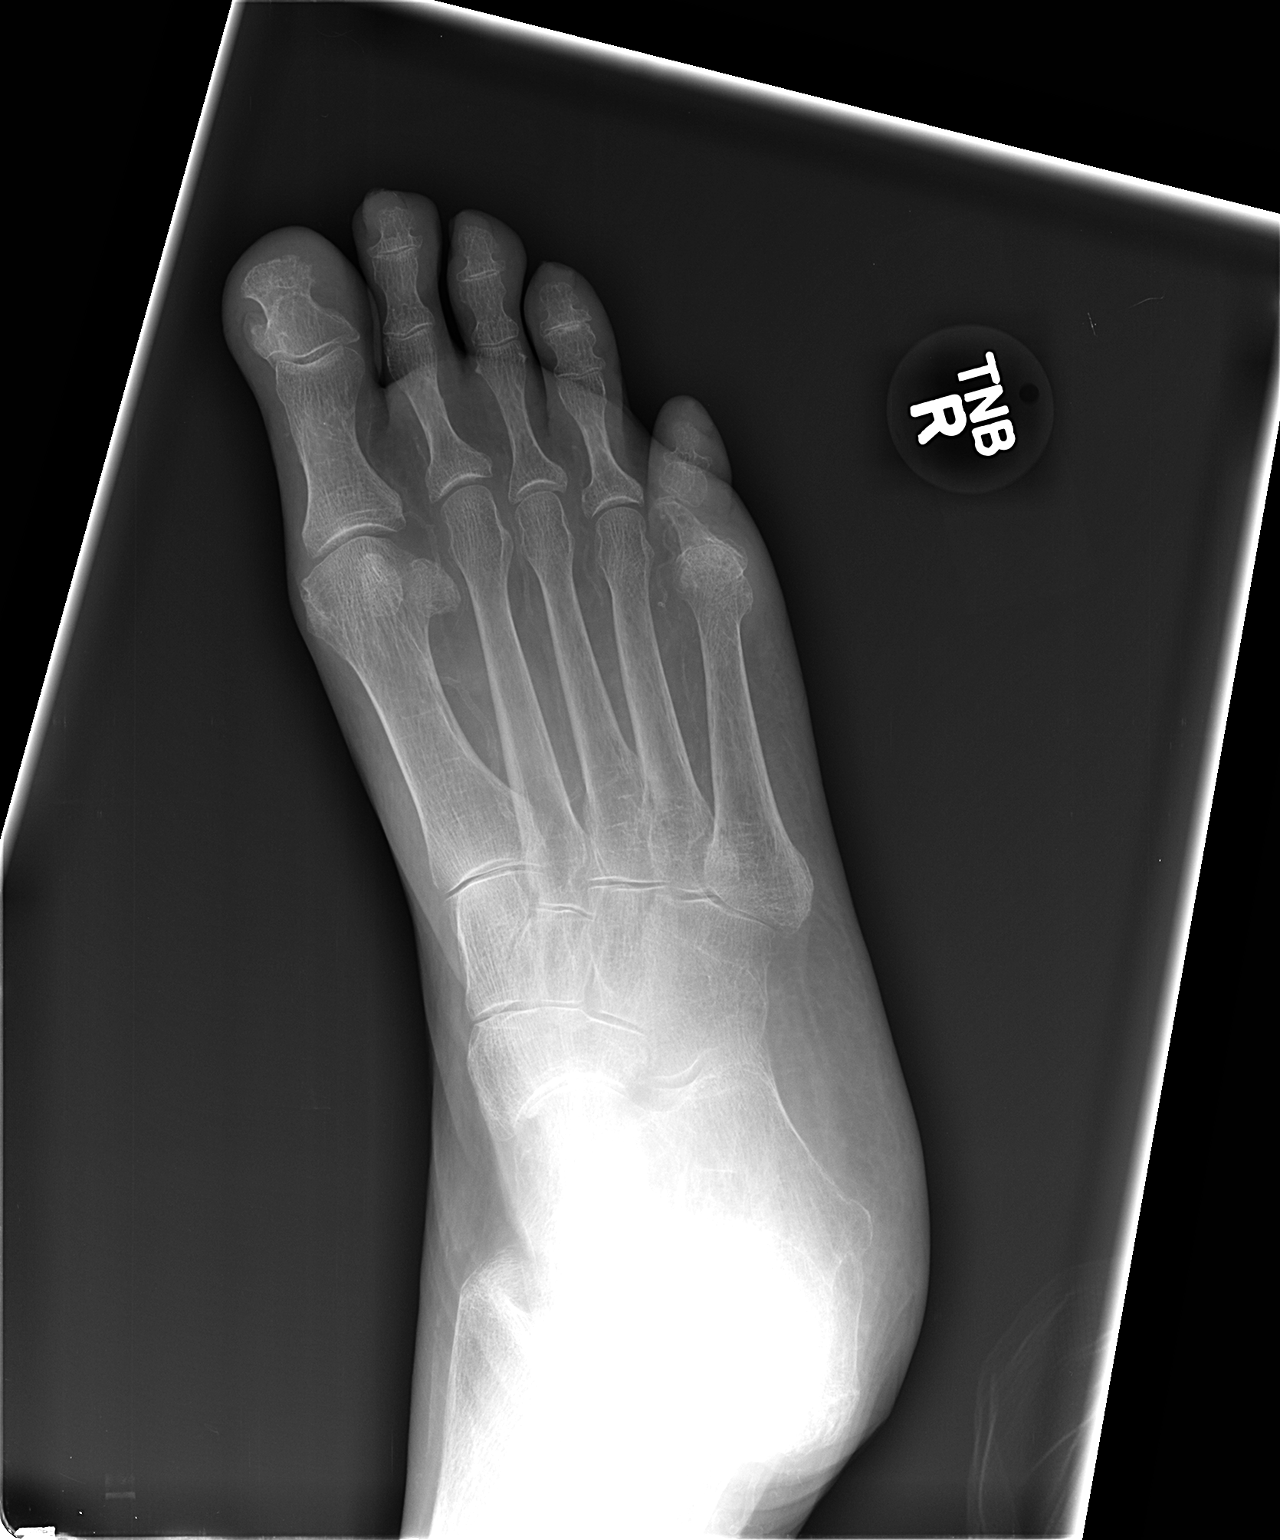

[view not recorded (2 of 2)]
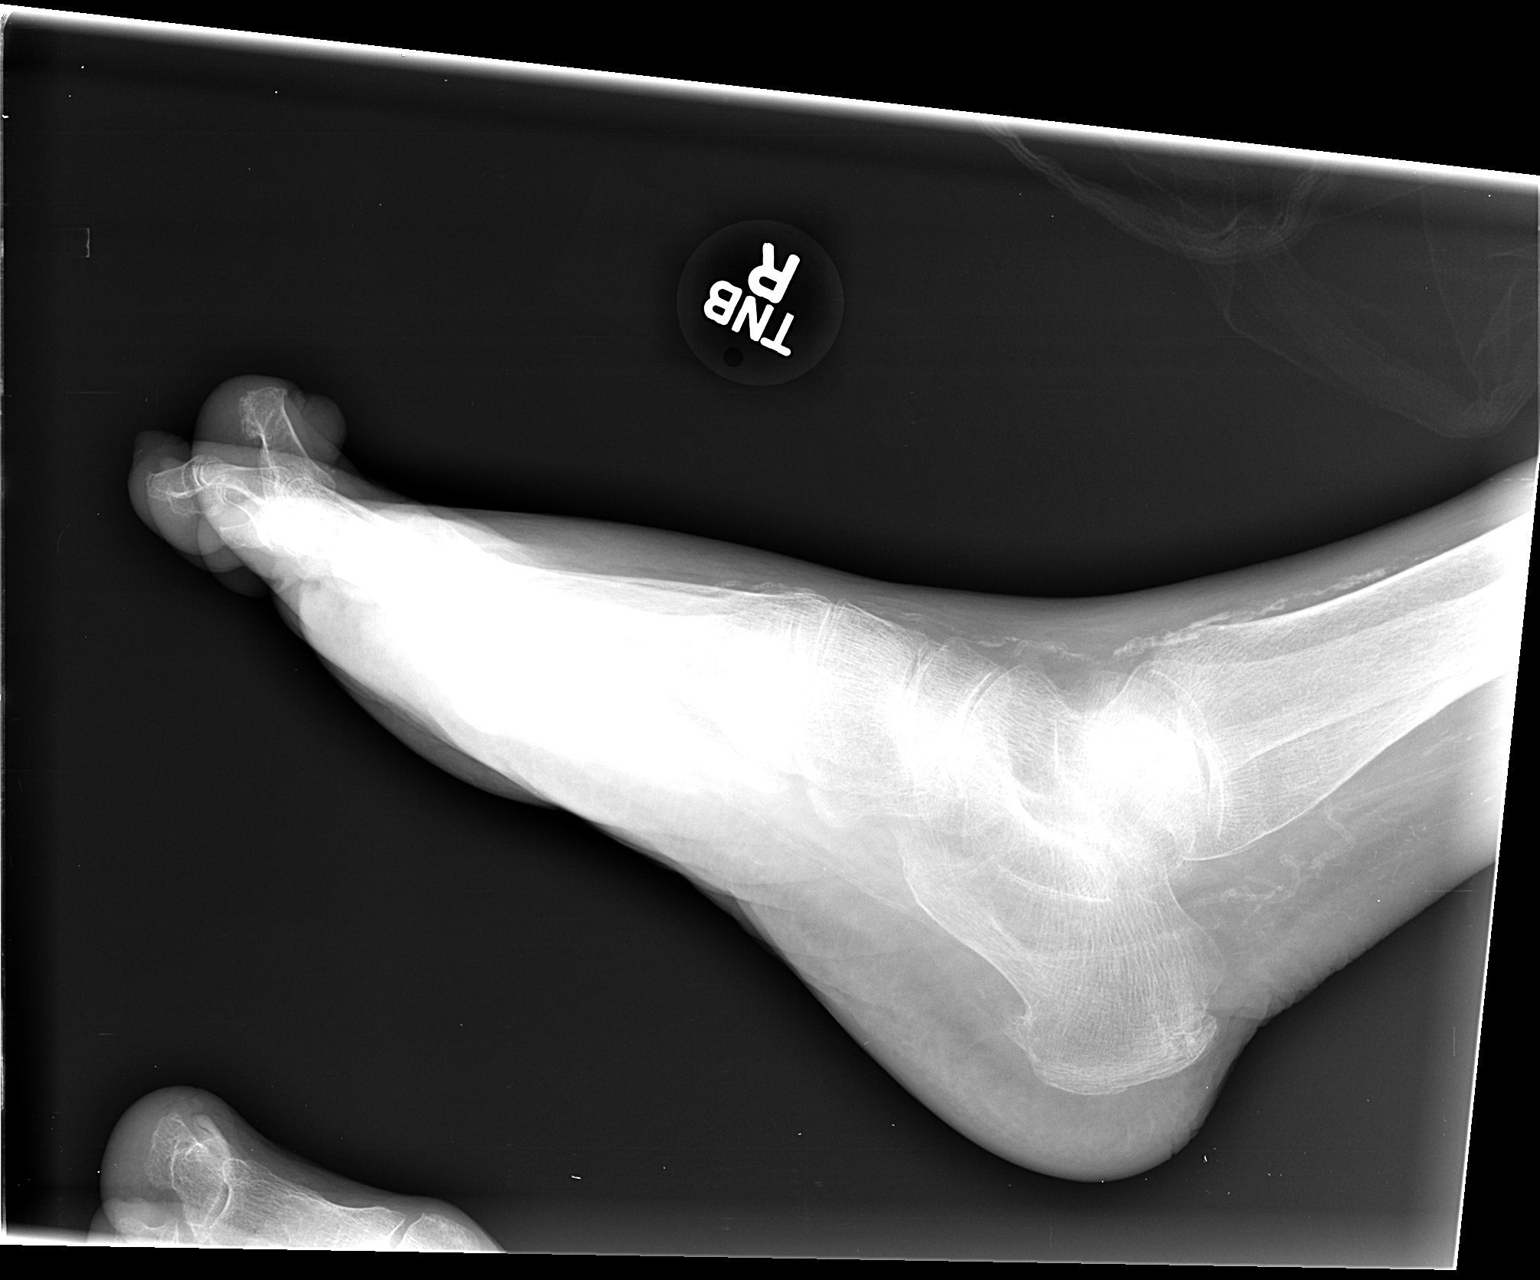

[2 of 2 positions shown; findings below may reference images not displayed]

FINDINGS: The bones are diffusely demineralized. There is stable chronic
arthropathy at the fifth metatarsal phalangeal joint and chronic
erosion of the head of the fifth proximal phalanx. No acute fracture
or acute bone destruction demonstrated. Calcaneal spurs and diffuse
vascular calcifications are noted. Mild prominence of the forefoot
soft tissues appears unchanged.
IMPRESSION: Stable right foot radiographs with chronic findings in the small
toe. No evidence of osteomyelitis.
